# Patient Record
Sex: Male | Born: 1950 | Race: White | Hispanic: No | Marital: Married | Smoking: Former smoker
Health system: Southern US, Community
[De-identification: ages and names within clinical notes are randomized; demographics above are authoritative.]

## PROBLEM LIST (undated history)

## (undated) DIAGNOSIS — I700 Atherosclerosis of aorta: Secondary | ICD-10-CM

## (undated) DIAGNOSIS — N529 Male erectile dysfunction, unspecified: Secondary | ICD-10-CM

## (undated) DIAGNOSIS — E871 Hypo-osmolality and hyponatremia: Secondary | ICD-10-CM

## (undated) DIAGNOSIS — K2980 Duodenitis without bleeding: Secondary | ICD-10-CM

## (undated) DIAGNOSIS — D126 Benign neoplasm of colon, unspecified: Secondary | ICD-10-CM

## (undated) DIAGNOSIS — D649 Anemia, unspecified: Secondary | ICD-10-CM

## (undated) DIAGNOSIS — R202 Paresthesia of skin: Secondary | ICD-10-CM

## (undated) DIAGNOSIS — R0600 Dyspnea, unspecified: Secondary | ICD-10-CM

## (undated) DIAGNOSIS — Z87891 Personal history of nicotine dependence: Secondary | ICD-10-CM

## (undated) DIAGNOSIS — D472 Monoclonal gammopathy: Secondary | ICD-10-CM

## (undated) DIAGNOSIS — Z860100 Personal history of colon polyps, unspecified: Secondary | ICD-10-CM

## (undated) DIAGNOSIS — Z789 Other specified health status: Secondary | ICD-10-CM

## (undated) DIAGNOSIS — I739 Peripheral vascular disease, unspecified: Secondary | ICD-10-CM

## (undated) DIAGNOSIS — C801 Malignant (primary) neoplasm, unspecified: Secondary | ICD-10-CM

## (undated) DIAGNOSIS — R918 Other nonspecific abnormal finding of lung field: Secondary | ICD-10-CM

## (undated) DIAGNOSIS — M359 Systemic involvement of connective tissue, unspecified: Secondary | ICD-10-CM

## (undated) DIAGNOSIS — M329 Systemic lupus erythematosus, unspecified: Secondary | ICD-10-CM

## (undated) DIAGNOSIS — Z860101 Personal history of adenomatous and serrated colon polyps: Secondary | ICD-10-CM

## (undated) DIAGNOSIS — J309 Allergic rhinitis, unspecified: Secondary | ICD-10-CM

## (undated) DIAGNOSIS — Z7982 Long term (current) use of aspirin: Secondary | ICD-10-CM

## (undated) DIAGNOSIS — E538 Deficiency of other specified B group vitamins: Secondary | ICD-10-CM

## (undated) DIAGNOSIS — Z79899 Other long term (current) drug therapy: Secondary | ICD-10-CM

## (undated) DIAGNOSIS — I714 Abdominal aortic aneurysm, without rupture, unspecified: Secondary | ICD-10-CM

## (undated) DIAGNOSIS — I2510 Atherosclerotic heart disease of native coronary artery without angina pectoris: Secondary | ICD-10-CM

## (undated) DIAGNOSIS — R200 Anesthesia of skin: Secondary | ICD-10-CM

## (undated) DIAGNOSIS — M1990 Unspecified osteoarthritis, unspecified site: Secondary | ICD-10-CM

## (undated) DIAGNOSIS — B029 Zoster without complications: Secondary | ICD-10-CM

## (undated) DIAGNOSIS — N400 Enlarged prostate without lower urinary tract symptoms: Secondary | ICD-10-CM

## (undated) DIAGNOSIS — D3501 Benign neoplasm of right adrenal gland: Secondary | ICD-10-CM

## (undated) DIAGNOSIS — R350 Frequency of micturition: Secondary | ICD-10-CM

## (undated) DIAGNOSIS — Z955 Presence of coronary angioplasty implant and graft: Secondary | ICD-10-CM

## (undated) DIAGNOSIS — I708 Atherosclerosis of other arteries: Secondary | ICD-10-CM

## (undated) DIAGNOSIS — R0609 Other forms of dyspnea: Secondary | ICD-10-CM

## (undated) DIAGNOSIS — C642 Malignant neoplasm of left kidney, except renal pelvis: Secondary | ICD-10-CM

## (undated) DIAGNOSIS — N2889 Other specified disorders of kidney and ureter: Secondary | ICD-10-CM

## (undated) DIAGNOSIS — M48061 Spinal stenosis, lumbar region without neurogenic claudication: Secondary | ICD-10-CM

## (undated) DIAGNOSIS — G5600 Carpal tunnel syndrome, unspecified upper limb: Secondary | ICD-10-CM

## (undated) DIAGNOSIS — Z87442 Personal history of urinary calculi: Secondary | ICD-10-CM

## (undated) DIAGNOSIS — D3502 Benign neoplasm of left adrenal gland: Secondary | ICD-10-CM

## (undated) DIAGNOSIS — B977 Papillomavirus as the cause of diseases classified elsewhere: Secondary | ICD-10-CM

## (undated) DIAGNOSIS — I10 Essential (primary) hypertension: Secondary | ICD-10-CM

## (undated) DIAGNOSIS — K760 Fatty (change of) liver, not elsewhere classified: Secondary | ICD-10-CM

## (undated) DIAGNOSIS — R351 Nocturia: Secondary | ICD-10-CM

## (undated) DIAGNOSIS — A630 Anogenital (venereal) warts: Secondary | ICD-10-CM

## (undated) DIAGNOSIS — E785 Hyperlipidemia, unspecified: Secondary | ICD-10-CM

## (undated) DIAGNOSIS — Z8619 Personal history of other infectious and parasitic diseases: Secondary | ICD-10-CM

## (undated) DIAGNOSIS — I723 Aneurysm of iliac artery: Secondary | ICD-10-CM

## (undated) DIAGNOSIS — K219 Gastro-esophageal reflux disease without esophagitis: Secondary | ICD-10-CM

## (undated) DIAGNOSIS — M549 Dorsalgia, unspecified: Secondary | ICD-10-CM

## (undated) DIAGNOSIS — F419 Anxiety disorder, unspecified: Secondary | ICD-10-CM

## (undated) DIAGNOSIS — IMO0002 Reserved for concepts with insufficient information to code with codable children: Secondary | ICD-10-CM

## (undated) DIAGNOSIS — M7580 Other shoulder lesions, unspecified shoulder: Secondary | ICD-10-CM

## (undated) DIAGNOSIS — I70211 Atherosclerosis of native arteries of extremities with intermittent claudication, right leg: Secondary | ICD-10-CM

## (undated) DIAGNOSIS — Z7960 Long term (current) use of unspecified immunomodulators and immunosuppressants: Secondary | ICD-10-CM

## (undated) HISTORY — PX: BRONCHIAL BIOPSY: SHX5109

## (undated) HISTORY — PX: COLONOSCOPY: SHX174

## (undated) HISTORY — PX: ESOPHAGOGASTRODUODENOSCOPY: SHX1529

## (undated) HISTORY — PX: CORONARY ANGIOPLASTY: SHX604

## (undated) HISTORY — PX: BACK SURGERY: SHX140

## (undated) HISTORY — PX: OTHER SURGICAL HISTORY: SHX169

---

## 2005-02-26 ENCOUNTER — Ambulatory Visit: Payer: Self-pay | Admitting: General Practice

## 2005-06-18 ENCOUNTER — Ambulatory Visit: Payer: Self-pay | Admitting: Unknown Physician Specialty

## 2005-09-22 ENCOUNTER — Ambulatory Visit: Payer: Self-pay | Admitting: Emergency Medicine

## 2007-10-27 ENCOUNTER — Ambulatory Visit: Payer: Self-pay | Admitting: General Practice

## 2008-01-09 ENCOUNTER — Ambulatory Visit: Payer: Self-pay | Admitting: Rheumatology

## 2008-02-11 ENCOUNTER — Ambulatory Visit: Payer: Self-pay | Admitting: Internal Medicine

## 2008-03-09 ENCOUNTER — Ambulatory Visit: Payer: Self-pay | Admitting: Internal Medicine

## 2008-03-10 ENCOUNTER — Ambulatory Visit: Payer: Self-pay | Admitting: Internal Medicine

## 2008-03-12 ENCOUNTER — Ambulatory Visit: Payer: Self-pay | Admitting: Internal Medicine

## 2008-04-12 ENCOUNTER — Ambulatory Visit: Payer: Self-pay | Admitting: Internal Medicine

## 2008-06-12 ENCOUNTER — Ambulatory Visit: Payer: Self-pay | Admitting: Internal Medicine

## 2008-08-13 ENCOUNTER — Ambulatory Visit: Payer: Self-pay | Admitting: Unknown Physician Specialty

## 2008-12-13 ENCOUNTER — Ambulatory Visit: Payer: Self-pay | Admitting: Internal Medicine

## 2009-01-05 ENCOUNTER — Ambulatory Visit: Payer: Self-pay | Admitting: Internal Medicine

## 2009-01-10 ENCOUNTER — Ambulatory Visit: Payer: Self-pay | Admitting: Internal Medicine

## 2009-01-27 ENCOUNTER — Ambulatory Visit: Payer: Self-pay | Admitting: Internal Medicine

## 2009-01-28 ENCOUNTER — Ambulatory Visit: Payer: Self-pay | Admitting: General Practice

## 2009-02-10 ENCOUNTER — Ambulatory Visit: Payer: Self-pay | Admitting: Internal Medicine

## 2009-03-01 ENCOUNTER — Ambulatory Visit: Payer: Self-pay | Admitting: Orthopedic Surgery

## 2009-03-09 ENCOUNTER — Ambulatory Visit: Payer: Self-pay | Admitting: Orthopedic Surgery

## 2009-05-17 ENCOUNTER — Ambulatory Visit: Payer: Self-pay | Admitting: General Practice

## 2009-07-13 ENCOUNTER — Ambulatory Visit: Payer: Self-pay | Admitting: Internal Medicine

## 2009-08-05 ENCOUNTER — Ambulatory Visit: Payer: Self-pay | Admitting: Internal Medicine

## 2009-08-12 ENCOUNTER — Ambulatory Visit: Payer: Self-pay | Admitting: Internal Medicine

## 2010-01-10 ENCOUNTER — Ambulatory Visit: Payer: Self-pay | Admitting: Internal Medicine

## 2010-02-02 ENCOUNTER — Ambulatory Visit: Payer: Self-pay | Admitting: Internal Medicine

## 2010-02-10 ENCOUNTER — Ambulatory Visit: Payer: Self-pay | Admitting: Internal Medicine

## 2010-03-12 ENCOUNTER — Ambulatory Visit: Payer: Self-pay | Admitting: Internal Medicine

## 2010-04-06 ENCOUNTER — Ambulatory Visit: Payer: Self-pay | Admitting: Internal Medicine

## 2010-04-12 ENCOUNTER — Ambulatory Visit: Payer: Self-pay | Admitting: Internal Medicine

## 2010-05-12 ENCOUNTER — Ambulatory Visit: Payer: Self-pay | Admitting: Internal Medicine

## 2010-06-12 ENCOUNTER — Ambulatory Visit: Payer: Self-pay | Admitting: Internal Medicine

## 2010-07-13 ENCOUNTER — Ambulatory Visit: Payer: Self-pay | Admitting: Internal Medicine

## 2010-08-12 ENCOUNTER — Ambulatory Visit: Payer: Self-pay | Admitting: Internal Medicine

## 2010-09-12 ENCOUNTER — Ambulatory Visit: Payer: Self-pay | Admitting: Internal Medicine

## 2010-11-12 HISTORY — PX: CARDIAC CATHETERIZATION: SHX172

## 2010-11-24 ENCOUNTER — Ambulatory Visit: Payer: Self-pay | Admitting: General Practice

## 2010-12-07 ENCOUNTER — Ambulatory Visit: Payer: Self-pay | Admitting: Cardiology

## 2010-12-08 HISTORY — PX: CORONARY ANGIOPLASTY WITH STENT PLACEMENT: SHX49

## 2011-02-15 ENCOUNTER — Observation Stay: Payer: Self-pay | Admitting: Internal Medicine

## 2011-09-07 ENCOUNTER — Ambulatory Visit: Payer: Self-pay | Admitting: Internal Medicine

## 2011-09-13 ENCOUNTER — Ambulatory Visit: Payer: Self-pay | Admitting: Internal Medicine

## 2011-10-19 ENCOUNTER — Ambulatory Visit: Payer: Self-pay | Admitting: Unknown Physician Specialty

## 2012-04-11 ENCOUNTER — Other Ambulatory Visit: Payer: Self-pay | Admitting: General Practice

## 2012-05-29 ENCOUNTER — Ambulatory Visit: Payer: Self-pay | Admitting: Oncology

## 2014-01-01 ENCOUNTER — Ambulatory Visit: Payer: Self-pay | Admitting: Unknown Physician Specialty

## 2014-01-04 LAB — PATHOLOGY REPORT

## 2014-03-15 DIAGNOSIS — G5600 Carpal tunnel syndrome, unspecified upper limb: Secondary | ICD-10-CM | POA: Insufficient documentation

## 2014-03-15 DIAGNOSIS — D649 Anemia, unspecified: Secondary | ICD-10-CM | POA: Insufficient documentation

## 2014-03-18 DIAGNOSIS — I2510 Atherosclerotic heart disease of native coronary artery without angina pectoris: Secondary | ICD-10-CM | POA: Insufficient documentation

## 2014-04-27 DIAGNOSIS — D472 Monoclonal gammopathy: Secondary | ICD-10-CM | POA: Insufficient documentation

## 2014-04-27 DIAGNOSIS — K760 Fatty (change of) liver, not elsewhere classified: Secondary | ICD-10-CM | POA: Insufficient documentation

## 2014-12-18 ENCOUNTER — Ambulatory Visit: Payer: Self-pay | Admitting: General Practice

## 2015-01-12 ENCOUNTER — Other Ambulatory Visit (HOSPITAL_COMMUNITY): Payer: Self-pay | Admitting: Neurosurgery

## 2015-01-24 ENCOUNTER — Encounter (HOSPITAL_COMMUNITY): Payer: Self-pay | Admitting: Pharmacy Technician

## 2015-01-25 ENCOUNTER — Encounter (HOSPITAL_COMMUNITY)
Admission: RE | Admit: 2015-01-25 | Discharge: 2015-01-25 | Disposition: A | Discharge status: Home / Self Care | Payer: BLUE CROSS/BLUE SHIELD | Source: Ambulatory Visit | Attending: Neurosurgery | Admitting: Neurosurgery

## 2015-01-25 ENCOUNTER — Encounter (HOSPITAL_COMMUNITY): Payer: Self-pay

## 2015-01-25 DIAGNOSIS — M7138 Other bursal cyst, other site: Secondary | ICD-10-CM | POA: Insufficient documentation

## 2015-01-25 DIAGNOSIS — Z01812 Encounter for preprocedural laboratory examination: Secondary | ICD-10-CM | POA: Diagnosis present

## 2015-01-25 HISTORY — DX: Gastro-esophageal reflux disease without esophagitis: K21.9

## 2015-01-25 HISTORY — DX: Systemic lupus erythematosus, unspecified: M32.9

## 2015-01-25 HISTORY — DX: Benign prostatic hyperplasia without lower urinary tract symptoms: N40.0

## 2015-01-25 HISTORY — DX: Personal history of other infectious and parasitic diseases: Z86.19

## 2015-01-25 HISTORY — DX: Unspecified osteoarthritis, unspecified site: M19.90

## 2015-01-25 HISTORY — DX: Anxiety disorder, unspecified: F41.9

## 2015-01-25 HISTORY — DX: Reserved for concepts with insufficient information to code with codable children: IMO0002

## 2015-01-25 HISTORY — DX: Paresthesia of skin: R20.2

## 2015-01-25 HISTORY — DX: Nocturia: R35.1

## 2015-01-25 HISTORY — DX: Atherosclerotic heart disease of native coronary artery without angina pectoris: I25.10

## 2015-01-25 HISTORY — DX: Personal history of colonic polyps: Z86.010

## 2015-01-25 HISTORY — DX: Essential (primary) hypertension: I10

## 2015-01-25 HISTORY — DX: Anemia, unspecified: D64.9

## 2015-01-25 HISTORY — DX: Dorsalgia, unspecified: M54.9

## 2015-01-25 HISTORY — DX: Frequency of micturition: R35.0

## 2015-01-25 HISTORY — DX: Personal history of colon polyps, unspecified: Z86.0100

## 2015-01-25 HISTORY — DX: Anesthesia of skin: R20.0

## 2015-01-25 HISTORY — DX: Hyperlipidemia, unspecified: E78.5

## 2015-01-25 LAB — CBC
HEMATOCRIT: 43.5 % (ref 39.0–52.0)
Hemoglobin: 15.9 g/dL (ref 13.0–17.0)
MCH: 34.9 pg — AB (ref 26.0–34.0)
MCHC: 36.6 g/dL — ABNORMAL HIGH (ref 30.0–36.0)
MCV: 95.6 fL (ref 78.0–100.0)
Platelets: 239 10*3/uL (ref 150–400)
RBC: 4.55 MIL/uL (ref 4.22–5.81)
RDW: 12.7 % (ref 11.5–15.5)
WBC: 4.1 10*3/uL (ref 4.0–10.5)

## 2015-01-25 LAB — BASIC METABOLIC PANEL
ANION GAP: 8 (ref 5–15)
BUN: 9 mg/dL (ref 6–23)
CHLORIDE: 91 mmol/L — AB (ref 96–112)
CO2: 28 mmol/L (ref 19–32)
Calcium: 9.2 mg/dL (ref 8.4–10.5)
Creatinine, Ser: 0.89 mg/dL (ref 0.50–1.35)
GLUCOSE: 98 mg/dL (ref 70–99)
POTASSIUM: 4 mmol/L (ref 3.5–5.1)
SODIUM: 127 mmol/L — AB (ref 135–145)

## 2015-01-25 LAB — SURGICAL PCR SCREEN
MRSA, PCR: NEGATIVE
Staphylococcus aureus: NEGATIVE

## 2015-01-25 NOTE — Progress Notes (Signed)
   01/25/15 1039  OBSTRUCTIVE SLEEP APNEA  Have you ever been diagnosed with sleep apnea through a sleep study? No  Do you snore loudly (loud enough to be heard through closed doors)?  0  Do you often feel tired, fatigued, or sleepy during the daytime? 0  Has anyone observed you stop breathing during your sleep? 0  Do you have, or are you being treated for high blood pressure? 1  BMI more than 35 kg/m2? 0  Age over 62 years old? 1  Neck circumference greater than 40 cm/16 inches? 1 (16.5)  Gender: 1

## 2015-01-25 NOTE — Progress Notes (Addendum)
Cardiologist is Dr.Fath with [facility]   EKG done about a month ago-to request from Dr.Fath  Stress test in 2012-to request from [facility] cath to be requested from [facility] is Dr.Whyatt Burt Ek  Denies CXR in past yr

## 2015-01-25 NOTE — Pre-Procedure Instructions (Signed)
Sergio Callahan  01/25/2015   Your procedure is scheduled on:  Thurs, Mar 24 @ 2:40 PM  Report to [facility]  at 11:45 AM.  Call this number if you have problems the morning of surgery: [phone]   Remember:   Do not eat food or drink liquids after midnight.   Take these medicines the morning of surgery with A SIP OF WATER: Alprazolam(Xanax),Azathioprine(Imuran),Flonase(Fluticasone-if needed),Pain Pill(if needed),Omeprazole(Prilosec),and Tamsulosin(Flomax)             Stop taking your Aspirin. No Goody's,BC's,Aleve,Ibuprofen,Fish Oil,or Herbal Medications.    Do not wear jewelry.  Do not wear lotions, powders, or colognes. You may wear deodorant.  Men may shave face and neck.  Do not bring valuables to the hospital.  [facility] is not responsible                  for any belongings or valuables.               Contacts, dentures or bridgework may not be worn into surgery.  Leave suitcase in the car. After surgery it may be brought to your room.  For patients admitted to the hospital, discharge time is determined by your                treatment team.                  Special Instructions:  [place] - Preparing for Surgery  Before surgery, you can play an important role.  Because skin is not sterile, your skin needs to be as free of germs as possible.  You can reduce the number of germs on you skin by washing with CHG (chlorahexidine gluconate) soap before surgery.  CHG is an antiseptic cleaner which kills germs and bonds with the skin to continue killing germs even after washing.  Please DO NOT use if you have an allergy to CHG or antibacterial soaps.  If your skin becomes reddened/irritated stop using the CHG and inform your nurse when you arrive at Short Stay.  Do not shave (including legs and underarms) for at least 48 hours prior to the first CHG shower.  You may shave your face.  Please follow these instructions carefully:   1.  Shower with CHG Soap the night  before surgery and the                                morning of Surgery.  2.  If you choose to wash your hair, wash your hair first as usual with your       normal shampoo.  3.  After you shampoo, rinse your hair and body thoroughly to remove the                      Shampoo.  4.  Use CHG as you would any other liquid soap.  You can apply chg directly       to the skin and wash gently with scrungie or a clean washcloth.  5.  Apply the CHG Soap to your body ONLY FROM THE NECK DOWN.        Do not use on open wounds or open sores.  Avoid contact with your eyes,       ears, mouth and genitals (private parts).  Wash genitals (private parts)       with your normal soap.  6.  Wash thoroughly, paying special attention to the area where your surgery        will be performed.  7.  Thoroughly rinse your body with warm water from the neck down.  8.  DO NOT shower/wash with your normal soap after using and rinsing off       the CHG Soap.  9.  Pat yourself dry with a clean towel.            10.  Wear clean pajamas.            11.  Place clean sheets on your bed the night of your first shower and do not        sleep with pets.  Day of Surgery  Do not apply any lotions/deoderants the morning of surgery.  Please wear clean clothes to the hospital/surgery center.     Please read over the following fact sheets that you were given: Pain Booklet, Coughing and Deep Breathing, MRSA Information and Surgical Site Infection Prevention

## 2015-01-25 NOTE — Progress Notes (Signed)
Anesthesia Chart Review:  Pt is 64 year old male scheduled for L4-5 lumbar laminectomy/decompression/microdiscectomy on 02/03/2015 with Dr. Arnoldo Morale.   Cardiologist is Dr. Ubaldo Glassing at [facility].   PMH includes: HTN, CAD, hyperlipidemia, anemia, GERD. Current smoker. BMI 26.   Preoperative labs reviewed.  NA 127, Cl 91.   EKG 01/11/2015: NSR.   Exercise stress test 02/15/2011: 1. Normal treadmill EKG without evidence of ischemia or arrhythmia.  2. Average exercise tolerance for age.  3. Normal LV systolic function. EF 75% 4. Normal myocardial perfusion without evidence of myocardial ischemia  Cardiac cath 12/08/2010: -LM and LCFX normal. RCA not evaluated. Proximal LAD with 99% stenosis.  -DES to LAD.   Pt has cardiac clearance from Dr. Ubaldo Glassing. (See progress note on paper chart).   If no changes, I anticipate pt can proceed with surgery as scheduled.   Willeen Cass, FNP-BC [facility] Short Stay Surgical Center/Anesthesiology Phone: [phone] 01/25/2015 3:35 PM

## 2015-02-02 MED ORDER — CEFAZOLIN SODIUM-DEXTROSE 2-3 GM-% IV SOLR
2.0000 g | INTRAVENOUS | Status: AC
  Administered 2015-02-03: 2 g via INTRAVENOUS
  Filled 2015-02-02: qty 50

## 2015-02-03 ENCOUNTER — Encounter (HOSPITAL_COMMUNITY): Payer: Self-pay | Admitting: Critical Care Medicine

## 2015-02-03 ENCOUNTER — Inpatient Hospital Stay (HOSPITAL_COMMUNITY)
Admission: RE | Admit: 2015-02-03 | Discharge: 2015-02-03 | DRG: 502 | Disposition: A | Discharge status: Home / Self Care | Payer: BLUE CROSS/BLUE SHIELD | Source: Ambulatory Visit | Attending: Neurosurgery | Admitting: Neurosurgery

## 2015-02-03 ENCOUNTER — Encounter (HOSPITAL_COMMUNITY)
Admission: RE | Disposition: A | Discharge status: Home / Self Care | Payer: Self-pay | Source: Ambulatory Visit | Attending: Neurosurgery

## 2015-02-03 ENCOUNTER — Inpatient Hospital Stay (HOSPITAL_COMMUNITY): Payer: BLUE CROSS/BLUE SHIELD | Admitting: Emergency Medicine

## 2015-02-03 ENCOUNTER — Inpatient Hospital Stay (HOSPITAL_COMMUNITY): Payer: BLUE CROSS/BLUE SHIELD | Admitting: Critical Care Medicine

## 2015-02-03 ENCOUNTER — Inpatient Hospital Stay (HOSPITAL_COMMUNITY): Payer: BLUE CROSS/BLUE SHIELD

## 2015-02-03 DIAGNOSIS — Z419 Encounter for procedure for purposes other than remedying health state, unspecified: Secondary | ICD-10-CM

## 2015-02-03 DIAGNOSIS — Z7982 Long term (current) use of aspirin: Secondary | ICD-10-CM | POA: Diagnosis not present

## 2015-02-03 DIAGNOSIS — M4806 Spinal stenosis, lumbar region: Secondary | ICD-10-CM | POA: Diagnosis present

## 2015-02-03 DIAGNOSIS — N400 Enlarged prostate without lower urinary tract symptoms: Secondary | ICD-10-CM | POA: Diagnosis present

## 2015-02-03 DIAGNOSIS — F17210 Nicotine dependence, cigarettes, uncomplicated: Secondary | ICD-10-CM | POA: Diagnosis present

## 2015-02-03 DIAGNOSIS — M329 Systemic lupus erythematosus, unspecified: Secondary | ICD-10-CM | POA: Diagnosis present

## 2015-02-03 DIAGNOSIS — M7138 Other bursal cyst, other site: Secondary | ICD-10-CM | POA: Diagnosis present

## 2015-02-03 DIAGNOSIS — Z79899 Other long term (current) drug therapy: Secondary | ICD-10-CM | POA: Diagnosis not present

## 2015-02-03 DIAGNOSIS — K219 Gastro-esophageal reflux disease without esophagitis: Secondary | ICD-10-CM | POA: Diagnosis present

## 2015-02-03 DIAGNOSIS — I2510 Atherosclerotic heart disease of native coronary artery without angina pectoris: Secondary | ICD-10-CM | POA: Diagnosis present

## 2015-02-03 DIAGNOSIS — I10 Essential (primary) hypertension: Secondary | ICD-10-CM | POA: Diagnosis present

## 2015-02-03 DIAGNOSIS — M5416 Radiculopathy, lumbar region: Secondary | ICD-10-CM | POA: Diagnosis present

## 2015-02-03 DIAGNOSIS — Z955 Presence of coronary angioplasty implant and graft: Secondary | ICD-10-CM

## 2015-02-03 DIAGNOSIS — M1990 Unspecified osteoarthritis, unspecified site: Secondary | ICD-10-CM | POA: Diagnosis present

## 2015-02-03 DIAGNOSIS — F419 Anxiety disorder, unspecified: Secondary | ICD-10-CM | POA: Diagnosis present

## 2015-02-03 DIAGNOSIS — E785 Hyperlipidemia, unspecified: Secondary | ICD-10-CM | POA: Diagnosis present

## 2015-02-03 HISTORY — PX: LUMBAR LAMINECTOMY/DECOMPRESSION MICRODISCECTOMY: SHX5026

## 2015-02-03 SURGERY — LUMBAR LAMINECTOMY/DECOMPRESSION MICRODISCECTOMY 1 LEVEL
Anesthesia: General | Site: Spine Lumbar | Laterality: Left

## 2015-02-03 MED ORDER — LIDOCAINE HCL (CARDIAC) 20 MG/ML IV SOLN
INTRAVENOUS | Status: DC | PRN
  Administered 2015-02-03: 100 mg via INTRAVENOUS

## 2015-02-03 MED ORDER — LACTATED RINGERS IV SOLN: INTRAVENOUS | Status: DC

## 2015-02-03 MED ORDER — PHENYLEPHRINE HCL 10 MG/ML IJ SOLN
INTRAMUSCULAR | Status: AC
  Filled 2015-02-03: qty 1

## 2015-02-03 MED ORDER — DEXAMETHASONE SODIUM PHOSPHATE 4 MG/ML IJ SOLN
INTRAMUSCULAR | Status: DC | PRN
  Administered 2015-02-03: 4 mg via INTRAVENOUS

## 2015-02-03 MED ORDER — HYDROMORPHONE HCL 1 MG/ML IJ SOLN: 0.2500 mg | INTRAMUSCULAR | Status: DC | PRN

## 2015-02-03 MED ORDER — NEOSTIGMINE METHYLSULFATE 10 MG/10ML IV SOLN
INTRAVENOUS | Status: AC
  Filled 2015-02-03: qty 1

## 2015-02-03 MED ORDER — MORPHINE SULFATE 2 MG/ML IJ SOLN: 1.0000 mg | INTRAMUSCULAR | Status: DC | PRN

## 2015-02-03 MED ORDER — PANTOPRAZOLE SODIUM 40 MG PO TBEC: 40.0000 mg | DELAYED_RELEASE_TABLET | Freq: Every day | ORAL | Status: DC

## 2015-02-03 MED ORDER — OXYCODONE-ACETAMINOPHEN 5-325 MG PO TABS: 1.0000 | ORAL_TABLET | ORAL | Status: DC | PRN

## 2015-02-03 MED ORDER — ONDANSETRON HCL 4 MG/2ML IJ SOLN
INTRAMUSCULAR | Status: AC
  Filled 2015-02-03: qty 2

## 2015-02-03 MED ORDER — LACTATED RINGERS IV SOLN
INTRAVENOUS | Status: DC | PRN
  Administered 2015-02-03: 13:00:00 via INTRAVENOUS

## 2015-02-03 MED ORDER — PROPOFOL 10 MG/ML IV BOLUS
INTRAVENOUS | Status: DC | PRN
  Administered 2015-02-03: 150 mg via INTRAVENOUS
  Administered 2015-02-03: 50 mg via INTRAVENOUS
  Administered 2015-02-03: 10 mg via INTRAVENOUS

## 2015-02-03 MED ORDER — HEMOSTATIC AGENTS (NO CHARGE) OPTIME
TOPICAL | Status: DC | PRN
  Administered 2015-02-03: 1 via TOPICAL

## 2015-02-03 MED ORDER — CEFAZOLIN SODIUM-DEXTROSE 2-3 GM-% IV SOLR
2.0000 g | Freq: Three times a day (TID) | INTRAVENOUS | Status: DC
  Administered 2015-02-03: 2 g via INTRAVENOUS
  Filled 2015-02-03 (×2): qty 50

## 2015-02-03 MED ORDER — 0.9 % SODIUM CHLORIDE (POUR BTL) OPTIME
TOPICAL | Status: DC | PRN
  Administered 2015-02-03: 1000 mL

## 2015-02-03 MED ORDER — DIAZEPAM 5 MG PO TABS: 5.0000 mg | ORAL_TABLET | Freq: Four times a day (QID) | ORAL | Status: DC | PRN

## 2015-02-03 MED ORDER — NEOSTIGMINE METHYLSULFATE 10 MG/10ML IV SOLN
INTRAVENOUS | Status: DC | PRN
  Administered 2015-02-03: 4 mg via INTRAVENOUS

## 2015-02-03 MED ORDER — ARTIFICIAL TEARS OP OINT
TOPICAL_OINTMENT | OPHTHALMIC | Status: DC | PRN
  Administered 2015-02-03: 1 via OPHTHALMIC

## 2015-02-03 MED ORDER — BUPIVACAINE-EPINEPHRINE (PF) 0.5% -1:200000 IJ SOLN
INTRAMUSCULAR | Status: DC | PRN
  Administered 2015-02-03 (×2): 10 mL

## 2015-02-03 MED ORDER — ROCURONIUM BROMIDE 100 MG/10ML IV SOLN
INTRAVENOUS | Status: DC | PRN
  Administered 2015-02-03: 40 mg via INTRAVENOUS

## 2015-02-03 MED ORDER — SODIUM CHLORIDE 0.9 % IR SOLN
Status: DC | PRN
  Administered 2015-02-03: 500 mL

## 2015-02-03 MED ORDER — PHENYLEPHRINE HCL 10 MG/ML IJ SOLN
INTRAMUSCULAR | Status: DC | PRN
  Administered 2015-02-03 (×5): 40 ug via INTRAVENOUS

## 2015-02-03 MED ORDER — FENTANYL CITRATE 0.05 MG/ML IJ SOLN
INTRAMUSCULAR | Status: DC | PRN
  Administered 2015-02-03: 25 ug via INTRAVENOUS
  Administered 2015-02-03: 150 ug via INTRAVENOUS
  Administered 2015-02-03: 25 ug via INTRAVENOUS
  Administered 2015-02-03: 50 ug via INTRAVENOUS

## 2015-02-03 MED ORDER — HYDROCHLOROTHIAZIDE 25 MG PO TABS: 25.0000 mg | ORAL_TABLET | Freq: Every day | ORAL | Status: DC

## 2015-02-03 MED ORDER — ALPRAZOLAM 0.5 MG PO TABS: 0.5000 mg | ORAL_TABLET | Freq: Every evening | ORAL | Status: DC | PRN

## 2015-02-03 MED ORDER — BISACODYL 10 MG RE SUPP: 10.0000 mg | Freq: Every day | RECTAL | Status: DC | PRN

## 2015-02-03 MED ORDER — PHENYLEPHRINE 40 MCG/ML (10ML) SYRINGE FOR IV PUSH (FOR BLOOD PRESSURE SUPPORT)
PREFILLED_SYRINGE | INTRAVENOUS | Status: AC
Start: 2015-02-03 — End: 2015-02-03
  Filled 2015-02-03: qty 10

## 2015-02-03 MED ORDER — DOCUSATE SODIUM 100 MG PO CAPS: 100.0000 mg | ORAL_CAPSULE | Freq: Two times a day (BID) | ORAL | Status: DC

## 2015-02-03 MED ORDER — HYDROCODONE-ACETAMINOPHEN 5-325 MG PO TABS: 1.0000 | ORAL_TABLET | ORAL | Status: DC | PRN

## 2015-02-03 MED ORDER — FLUTICASONE PROPIONATE 50 MCG/ACT NA SUSP: 1.0000 | Freq: Every day | NASAL | Status: DC | PRN

## 2015-02-03 MED ORDER — FENTANYL CITRATE 0.05 MG/ML IJ SOLN
INTRAMUSCULAR | Status: AC
  Filled 2015-02-03: qty 5

## 2015-02-03 MED ORDER — PRAVASTATIN SODIUM 40 MG PO TABS
40.0000 mg | ORAL_TABLET | Freq: Every day | ORAL | Status: DC
  Filled 2015-02-03: qty 1

## 2015-02-03 MED ORDER — MIDAZOLAM HCL 5 MG/5ML IJ SOLN
INTRAMUSCULAR | Status: DC | PRN
  Administered 2015-02-03: 2 mg via INTRAVENOUS

## 2015-02-03 MED ORDER — LISINOPRIL-HYDROCHLOROTHIAZIDE 20-25 MG PO TABS: 1.0000 | ORAL_TABLET | Freq: Every day | ORAL | Status: DC

## 2015-02-03 MED ORDER — ALUM & MAG HYDROXIDE-SIMETH 200-200-20 MG/5ML PO SUSP: 30.0000 mL | Freq: Four times a day (QID) | ORAL | Status: DC | PRN

## 2015-02-03 MED ORDER — OXYCODONE HCL 5 MG PO TABS: 5.0000 mg | ORAL_TABLET | Freq: Once | ORAL | Status: DC | PRN

## 2015-02-03 MED ORDER — PHENYLEPHRINE HCL 10 MG/ML IJ SOLN
10.0000 mg | INTRAMUSCULAR | Status: DC | PRN
  Administered 2015-02-03: 20 ug/min via INTRAVENOUS

## 2015-02-03 MED ORDER — TAMSULOSIN HCL 0.4 MG PO CAPS
0.4000 mg | ORAL_CAPSULE | Freq: Every day | ORAL | Status: DC
  Filled 2015-02-03: qty 1

## 2015-02-03 MED ORDER — ACETAMINOPHEN 325 MG PO TABS: 650.0000 mg | ORAL_TABLET | ORAL | Status: DC | PRN

## 2015-02-03 MED ORDER — METOPROLOL SUCCINATE ER 100 MG PO TB24
100.0000 mg | ORAL_TABLET | Freq: Every day | ORAL | Status: DC
  Filled 2015-02-03: qty 1

## 2015-02-03 MED ORDER — PROMETHAZINE HCL 25 MG/ML IJ SOLN: 6.2500 mg | INTRAMUSCULAR | Status: DC | PRN

## 2015-02-03 MED ORDER — BACITRACIN ZINC 500 UNIT/GM EX OINT
TOPICAL_OINTMENT | CUTANEOUS | Status: DC | PRN
  Administered 2015-02-03: 1 via TOPICAL

## 2015-02-03 MED ORDER — MIDAZOLAM HCL 2 MG/2ML IJ SOLN
INTRAMUSCULAR | Status: AC
  Filled 2015-02-03: qty 2

## 2015-02-03 MED ORDER — THROMBIN 5000 UNITS EX SOLR
CUTANEOUS | Status: DC | PRN
  Administered 2015-02-03 (×2): 5000 [IU] via TOPICAL

## 2015-02-03 MED ORDER — GLYCOPYRROLATE 0.2 MG/ML IJ SOLN
INTRAMUSCULAR | Status: DC | PRN
  Administered 2015-02-03: 0.6 mg via INTRAVENOUS

## 2015-02-03 MED ORDER — DEXAMETHASONE SODIUM PHOSPHATE 4 MG/ML IJ SOLN
INTRAMUSCULAR | Status: AC
  Filled 2015-02-03: qty 1

## 2015-02-03 MED ORDER — MENTHOL 3 MG MT LOZG: 1.0000 | LOZENGE | OROMUCOSAL | Status: DC | PRN

## 2015-02-03 MED ORDER — ONDANSETRON HCL 4 MG/2ML IJ SOLN: 4.0000 mg | INTRAMUSCULAR | Status: DC | PRN

## 2015-02-03 MED ORDER — ASPIRIN 81 MG PO TBEC: 81.0000 mg | DELAYED_RELEASE_TABLET | Freq: Every day | ORAL | Status: DC

## 2015-02-03 MED ORDER — ONDANSETRON HCL 4 MG/2ML IJ SOLN
INTRAMUSCULAR | Status: DC | PRN
  Administered 2015-02-03: 4 mg via INTRAVENOUS

## 2015-02-03 MED ORDER — OXYCODONE HCL 5 MG/5ML PO SOLN: 5.0000 mg | Freq: Once | ORAL | Status: DC | PRN

## 2015-02-03 MED ORDER — GLYCOPYRROLATE 0.2 MG/ML IJ SOLN
INTRAMUSCULAR | Status: AC
  Filled 2015-02-03: qty 3

## 2015-02-03 MED ORDER — LISINOPRIL 20 MG PO TABS: 20.0000 mg | ORAL_TABLET | Freq: Every day | ORAL | Status: DC

## 2015-02-03 MED ORDER — PHENOL 1.4 % MT LIQD: 1.0000 | OROMUCOSAL | Status: DC | PRN

## 2015-02-03 MED ORDER — ACETAMINOPHEN 650 MG RE SUPP: 650.0000 mg | RECTAL | Status: DC | PRN

## 2015-02-03 SURGICAL SUPPLY — 56 items
BAG DECANTER FOR FLEXI CONT (MISCELLANEOUS) ×3 IMPLANT
BENZOIN TINCTURE PRP APPL 2/3 (GAUZE/BANDAGES/DRESSINGS) ×3 IMPLANT
BLADE CLIPPER SURG (BLADE) ×3 IMPLANT
BRUSH SCRUB EZ PLAIN DRY (MISCELLANEOUS) ×3 IMPLANT
BUR MATCHSTICK NEURO 3.0 LAGG (BURR) ×3 IMPLANT
BUR PRECISION FLUTE 6.0 (BURR) ×3 IMPLANT
CANISTER SUCT 3000ML PPV (MISCELLANEOUS) ×3 IMPLANT
CLOSURE WOUND 1/2 X4 (GAUZE/BANDAGES/DRESSINGS) ×1
CONT SPEC 4OZ CLIKSEAL STRL BL (MISCELLANEOUS) ×3 IMPLANT
DRAPE LAPAROTOMY 100X72X124 (DRAPES) ×3 IMPLANT
DRAPE MICROSCOPE LEICA (MISCELLANEOUS) ×3 IMPLANT
DRAPE POUCH INSTRU U-SHP 10X18 (DRAPES) ×3 IMPLANT
DRAPE SURG 17X23 STRL (DRAPES) ×12 IMPLANT
ELECT BLADE 4.0 EZ CLEAN MEGAD (MISCELLANEOUS) ×3
ELECT REM PT RETURN 9FT ADLT (ELECTROSURGICAL) ×3
ELECTRODE BLDE 4.0 EZ CLN MEGD (MISCELLANEOUS) ×1 IMPLANT
ELECTRODE REM PT RTRN 9FT ADLT (ELECTROSURGICAL) ×1 IMPLANT
GAUZE SPONGE 4X4 12PLY STRL (GAUZE/BANDAGES/DRESSINGS) ×3 IMPLANT
GAUZE SPONGE 4X4 16PLY XRAY LF (GAUZE/BANDAGES/DRESSINGS) IMPLANT
GLOVE BIO SURGEON STRL SZ7 (GLOVE) ×3 IMPLANT
GLOVE BIO SURGEON STRL SZ8 (GLOVE) ×3 IMPLANT
GLOVE BIO SURGEON STRL SZ8.5 (GLOVE) ×3 IMPLANT
GLOVE BIOGEL PI IND STRL 7.5 (GLOVE) ×3 IMPLANT
GLOVE BIOGEL PI INDICATOR 7.5 (GLOVE) ×6
GLOVE ECLIPSE 7.0 STRL STRAW (GLOVE) ×3 IMPLANT
GLOVE ECLIPSE 7.5 STRL STRAW (GLOVE) ×6 IMPLANT
GLOVE EXAM NITRILE LRG STRL (GLOVE) IMPLANT
GLOVE EXAM NITRILE MD LF STRL (GLOVE) IMPLANT
GLOVE EXAM NITRILE XL STR (GLOVE) IMPLANT
GLOVE EXAM NITRILE XS STR PU (GLOVE) IMPLANT
GLOVE SURG SS PI 7.0 STRL IVOR (GLOVE) ×6 IMPLANT
GOWN STRL REUS W/ TWL LRG LVL3 (GOWN DISPOSABLE) ×1 IMPLANT
GOWN STRL REUS W/ TWL XL LVL3 (GOWN DISPOSABLE) ×3 IMPLANT
GOWN STRL REUS W/TWL 2XL LVL3 (GOWN DISPOSABLE) IMPLANT
GOWN STRL REUS W/TWL LRG LVL3 (GOWN DISPOSABLE) ×2
GOWN STRL REUS W/TWL XL LVL3 (GOWN DISPOSABLE) ×6
KIT BASIN OR (CUSTOM PROCEDURE TRAY) ×3 IMPLANT
KIT ROOM TURNOVER OR (KITS) ×3 IMPLANT
NEEDLE HYPO 21X1.5 SAFETY (NEEDLE) IMPLANT
NEEDLE HYPO 22GX1.5 SAFETY (NEEDLE) ×3 IMPLANT
NS IRRIG 1000ML POUR BTL (IV SOLUTION) ×3 IMPLANT
PACK LAMINECTOMY NEURO (CUSTOM PROCEDURE TRAY) ×3 IMPLANT
PAD ARMBOARD 7.5X6 YLW CONV (MISCELLANEOUS) ×15 IMPLANT
PATTIES SURGICAL .5 X1 (DISPOSABLE) IMPLANT
RUBBERBAND STERILE (MISCELLANEOUS) ×6 IMPLANT
SPONGE SURGIFOAM ABS GEL SZ50 (HEMOSTASIS) ×3 IMPLANT
STRIP CLOSURE SKIN 1/2X4 (GAUZE/BANDAGES/DRESSINGS) ×2 IMPLANT
SUT VIC AB 1 CT1 18XBRD ANBCTR (SUTURE) ×1 IMPLANT
SUT VIC AB 1 CT1 8-18 (SUTURE) ×2
SUT VIC AB 2-0 CP2 18 (SUTURE) ×3 IMPLANT
SYR 20CC LL (SYRINGE) IMPLANT
SYR 20ML ECCENTRIC (SYRINGE) ×3 IMPLANT
TAPE CLOTH SURG 4X10 WHT LF (GAUZE/BANDAGES/DRESSINGS) ×3 IMPLANT
TOWEL OR 17X24 6PK STRL BLUE (TOWEL DISPOSABLE) ×3 IMPLANT
TOWEL OR 17X26 10 PK STRL BLUE (TOWEL DISPOSABLE) ×3 IMPLANT
WATER STERILE IRR 1000ML POUR (IV SOLUTION) ×3 IMPLANT

## 2015-02-03 NOTE — Plan of Care (Signed)
Problem: Consults Goal: Diagnosis - Spinal Surgery Outcome: Completed/Met Date Met:  02/03/15 Lumbar Laminectomy (Complex)

## 2015-02-03 NOTE — Discharge Summary (Signed)
Physician Discharge Summary  Patient ID: Sergio Callahan MRN: [id number] DOB/AGE: 12-Jan-1951 64 y.o.  Admit date: 02/03/2015 Discharge date: 02/03/2015  Admission Diagnoses: Lumbar synovial cyst, L4-5  Discharge Diagnoses: Same Active Problems:   Synovial cyst of lumbar facet joint   Discharged Condition: Stable  Hospital Course:  Mrs. BRAISON SNOKE is a 64 y.o. male who presented to the clinic with left radiculopathy and MRI demonstrating synovial cyst at L4-5. The patient was admitted for elective left L4-5 laminotomy and resection of synovial cyst which was done without complication. Postoperatively the patient was at neurologic baseline. Back pain was controlled with oral medication, he was ambulating without difficulty, voiding normally, and tolerating diet.  Treatments: Surgery - left L4-5 laminotomy, resection of synovial cyst  Discharge Exam: Blood pressure 161/95, pulse 74, temperature 98.4 F (36.9 C), temperature source Oral, resp. rate 20, height 6' (1.829 m), weight 86.955 kg (191 lb 11.2 oz), SpO2 99 %. Awake, alert, oriented Speech fluent, appropriate CN grossly intact 5/5 BUE/BLE Wound c/d/i  Follow-up: Follow-up in Dr. Arnoldo Morale' office [facility] [phone]) in 2-3 weeks  Disposition: Home     Medication List    STOP taking these medications        HYDROcodone-acetaminophen 5-325 MG per tablet  Commonly known as:  NORCO/VICODIN      TAKE these medications        ALPRAZolam 0.5 MG tablet  Commonly known as:  XANAX  Take 0.5 mg by mouth at bedtime as needed for anxiety.     aspirin 81 MG EC tablet  Take 1 tablet (81 mg total) by mouth daily. Swallow whole.  Start taking on:  02/10/2015     azaTHIOprine 50 MG tablet  Commonly known as:  IMURAN  Take 100 mg by mouth daily.     calcium carbonate 1250 (500 CA) MG tablet  Commonly known as:  OS-CAL - dosed in mg of elemental calcium  Take 1 tablet by mouth 2 (two) times daily  with a meal.     cyanocobalamin 1000 MCG/ML injection  Commonly known as:  (VITAMIN B-12)  Inject 1,000 mcg into the muscle every 30 (thirty) days.     diazepam 5 MG tablet  Commonly known as:  VALIUM  Take 1 tablet (5 mg total) by mouth every 6 (six) hours as needed for muscle spasms.     fluticasone 50 MCG/ACT nasal spray  Commonly known as:  FLONASE  Place 1 spray into both nostrils daily as needed for allergies.     hydroxychloroquine 200 MG tablet  Commonly known as:  PLAQUENIL  Take 200 mg by mouth 2 (two) times daily.     lisinopril-hydrochlorothiazide 20-25 MG per tablet  Commonly known as:  PRINZIDE,ZESTORETIC  Take 1 tablet by mouth daily.     metoprolol succinate 100 MG 24 hr tablet  Commonly known as:  TOPROL-XL  Take 100 mg by mouth at bedtime. Take with or immediately following a meal.     omeprazole 20 MG capsule  Commonly known as:  PRILOSEC  Take 20 mg by mouth daily.     oxyCODONE-acetaminophen 5-325 MG per tablet  Commonly known as:  PERCOCET/ROXICET  Take 1-2 tablets by mouth every 4 (four) hours as needed for moderate pain.     pravastatin 40 MG tablet  Commonly known as:  PRAVACHOL  Take 40 mg by mouth at bedtime.     tamsulosin 0.4 MG Caps capsule  Commonly known as:  FLOMAX  Take 0.4 mg by mouth daily after breakfast.     VIAGRA 100 MG tablet  Generic drug:  sildenafil  Take 100 mg by mouth as needed for erectile dysfunction.         SignedJairo Ben 02/03/2015, 6:39 PM

## 2015-02-03 NOTE — Progress Notes (Signed)
Subjective:  The patient is somnolent but easily arousable. He is in no apparent distress. He looks well.  Objective: Vital signs in last 24 hours: Temp:  [98.3 F (36.8 C)] 98.3 F (36.8 C) (03/24 1219) Pulse Rate:  [84] 84 (03/24 1219) Resp:  [20] 20 (03/24 1219) BP: (172)/(96) 172/96 mmHg (03/24 1219) SpO2:  [100 %] 100 % (03/24 1219) Weight:  [86.955 kg (191 lb 11.2 oz)] 86.955 kg (191 lb 11.2 oz) (03/24 1219)  Intake/Output from previous day:   Intake/Output this shift:    Physical exam the patient is somnolent but arousable. He is moving his lower extremities well. His dressing is clean and dry.  Lab Results: No results for input(s): WBC, HGB, HCT, PLT in the last 72 hours. BMET No results for input(s): NA, K, CL, CO2, GLUCOSE, BUN, CREATININE, CALCIUM in the last 72 hours.  Studies/Results: No results found.  Assessment/Plan: The patient is doing well.  LOS: 0 days     Ellen Mayol D 02/03/2015, 1:11 PM

## 2015-02-03 NOTE — Anesthesia Procedure Notes (Signed)
Procedure Name: Intubation Date/Time: 02/03/2015 1:19 PM Performed by: Merrilyn Puma B Pre-anesthesia Checklist: Patient identified, Timeout performed, Emergency Drugs available, Suction available and Patient being monitored Patient Re-evaluated:Patient Re-evaluated prior to inductionOxygen Delivery Method: Circle system utilized Preoxygenation: Pre-oxygenation with 100% oxygen Intubation Type: IV induction and Cricoid Pressure applied Ventilation: Mask ventilation without difficulty Laryngoscope Size: Mac and 4 Grade View: Grade III Tube type: Oral Tube size: 7.5 mm Number of attempts: 1 Airway Equipment and Method: Stylet Placement Confirmation: CO2 detector,  positive ETCO2,  ETT inserted through vocal cords under direct vision and breath sounds checked- equal and bilateral Secured at: 23 cm Tube secured with: Tape Dental Injury: Teeth and Oropharynx as per pre-operative assessment

## 2015-02-03 NOTE — Discharge Instructions (Signed)

## 2015-02-03 NOTE — Progress Notes (Signed)
Subjective:  The patient is alert and pleasant. He is in no apparent distress. He looks well.  Objective: Vital signs in last 24 hours: Temp:  [98.3 F (36.8 C)] 98.3 F (36.8 C) (03/24 1509) Pulse Rate:  [84] 84 (03/24 1219) Resp:  [20] 20 (03/24 1219) BP: (172)/(96) 172/96 mmHg (03/24 1219) SpO2:  [100 %] 100 % (03/24 1219) Weight:  [86.955 kg (191 lb 11.2 oz)] 86.955 kg (191 lb 11.2 oz) (03/24 1219)  Intake/Output from previous day:   Intake/Output this shift: Total I/O In: 800 [I.V.:800] Out: 50 [Blood:50]  Physical exam the patient is alert and pleasant. He is moving his lower extremities well.  Lab Results: No results for input(s): WBC, HGB, HCT, PLT in the last 72 hours. BMET No results for input(s): NA, K, CL, CO2, GLUCOSE, BUN, CREATININE, CALCIUM in the last 72 hours.  Studies/Results: No results found.  Assessment/Plan: The patient is doing well.  LOS: 0 days     Elleanor Guyett D 02/03/2015, 3:15 PM

## 2015-02-03 NOTE — Op Note (Signed)
Brief history: The patient is a 64 year old white male who has complained of back and left leg pain consistent with a lumbar radiculopathy. He failed medical management and was worked up with a lumbar MRI. This demonstrated a L4-5 synovial cyst with spinal stenosis. I discussed the various treatment options with the patient including surgery. He has weighed the risks, benefits, and alternative surgery and decided proceed with a left L4-5 laminectomy with resection of synovial cyst.  Preoperative diagnosis: Left L4-5 synovial cyst, spinal stenosis, lumbago, lumbar radiculopathy  Postoperative diagnosis: The same  Procedure: Left L4 hemilaminectomy for resection of synovial cyst  using micro-dissection  Surgeon: Dr. Earle Gell  Asst.: Dr. Kathyrn Sheriff  Anesthesia: Gen. endotracheal  Estimated blood loss: Minimal  Drains: None  Complications: None  Description of procedure: The patient was brought to the operating room by the anesthesia team. General endotracheal anesthesia was induced. The patient was turned to the prone position on the Wilson frame. The patient's lumbosacral region was then prepared with Betadine scrub and Betadine solution. Sterile drapes were applied.  I then injected the area to be incised with Marcaine with epinephrine solution. I then used a scalpel to make a linear midline incision over the L4-5 intervertebral disc space. I then used electrocautery to perform a left sided subperiosteal dissection exposing the spinous process and lamina of L4 and L5. We obtained intraoperative radiograph to confirm our location. I then inserted the [facility] retractor for exposure.  We then brought the operative microscope into the field. Under its magnification and illumination we completed the microdissection. I used a high-speed drill to perform a laminotomy at L4 and the left as well as remove the cephalad aspect of the L5 lamina.. I then used a Kerrison punches to complete the left  L4 hemilaminectomy and to widen the laminotomy and removed the ligamentum flavum at L4-5. We then used microdissection to free up the thecal sac and the left L5 nerve root from the epidural tissue. There was a synovial cyst as expected. I freed up from the thecal sac and nerve root using microdissection. I then used a Kerrison punch to perform a foraminotomy at about the left L5 nerve root and to remove the synovial cyst. I then palpated along the ventral surface of the thecal sac and along exit route of the left L5 nerve root and noted that the neural structures were well decompressed. This completed the decompression.  We then obtained hemostasis using bipolar electrocautery. We irrigated the wound out with bacitracin solution. We then removed the retractor. We then reapproximated the patient's thoracolumbar fascia with interrupted #1 Vicryl suture. We then reapproximated the patient's subcutaneous tissue with interrupted 2-0 Vicryl suture. We then reapproximated patient's skin with Steri-Strips and benzoin. The was then coated with bacitracin ointment. The drapes were removed. The patient was subsequently returned to the supine position where they were extubated by the anesthesia team. The patient was then transported to the postanesthesia care unit in stable condition. All sponge instrument and needle counts were reportedly correct at the end of this case.

## 2015-02-03 NOTE — Anesthesia Postprocedure Evaluation (Signed)
Anesthesia Post Note  Patient: Sergio Callahan  Procedure(s) Performed: Procedure(s) (LRB): LUMBAR LAMINECTOMY/DECOMPRESSION MICRODISCECTOMY LEFT LUMBAR FOUR-FIVE WITH REMOVAL OF SYNOVIAL CYST (Left)  Anesthesia type: general  Patient location: PACU  Post pain: Pain level controlled  Post assessment: Patient's Cardiovascular Status Stable  Last Vitals:  Filed Vitals:   02/03/15 1610  BP: 161/95  Pulse: 74  Temp: 36.9 C  Resp: 20    Post vital signs: Reviewed and stable  Level of consciousness: sedated  Complications: No apparent anesthesia complications

## 2015-02-03 NOTE — Progress Notes (Signed)
Patient alert and oriented, mae's well, voiding adequate amount of urine, swallowing without difficulty, no c/o pain. Patient discharged home with family. Script and discharged instructions given to patient. Patient and family stated understanding of d/c instructions given and has an appointment with MD. Aisha Shylo Dillenbeck RN 

## 2015-02-03 NOTE — H&P (Signed)
Subjective: The patient is a 65 year old white male who has complained of back and left leg pain consistent with a lumbar radiculopathy. He has failed medical management and was worked up with a lumbar MRI which demonstrated L4-5 synovial cyst on the left with spinal stenosis. I discussed the various treatment options with the patient including surgery. He has decided proceed with a lumbar laminectomy and removal of the synovial cyst.  Past Medical History  Diagnosis Date  . Anxiety     takes Xanax daily as needed  . Hypertension     takes Lisinopril-HCTZ and Metoprolol daily  . Hyperlipidemia     takes Pravastatin nightly  . GERD (gastroesophageal reflux disease)     takes Omeprazole daily  . Enlarged prostate     takes Flomax daily-slightly  . Coronary artery disease   . Numbness and tingling     left hip/leg/foot  . Lupus     takes Plaquenil and Imran daily  . Arthritis     back   . Back pain     synovial cyst  . History of colon polyps     benign  . Urinary frequency   . Nocturia   . Anemia     takes B12 injections monthly  . History of shingles     Past Surgical History  Procedure Laterality Date  . Cardiac catheterization  2012  . Coronary angioplasty      1 stent  . Colonoscopy    . Esophagogastroduodenoscopy      Allergies  Allergen Reactions  . Fosamax [Alendronate Sodium]     Leg pain    History  Substance Use Topics  . Smoking status: Current Every Day Smoker -- 1.50 packs/day for 30 years  . Smokeless tobacco: Not on file  . Alcohol Use: Yes     Comment: cocktails occasionally    History reviewed. No pertinent family history. Prior to Admission medications   Medication Sig Start Date End Date Taking? Authorizing Provider  ALPRAZolam Duanne Moron) 0.5 MG tablet Take 0.5 mg by mouth at bedtime as needed for anxiety.   Yes Historical Provider, MD  aspirin 81 MG EC tablet Take 81 mg by mouth daily. Swallow whole.   Yes Historical Provider, MD  azaTHIOprine  (IMURAN) 50 MG tablet Take 100 mg by mouth daily.   Yes Historical Provider, MD  calcium carbonate (OS-CAL - DOSED IN MG OF ELEMENTAL CALCIUM) 1250 (500 CA) MG tablet Take 1 tablet by mouth 2 (two) times daily with a meal.   Yes Historical Provider, MD  cyanocobalamin (,VITAMIN B-12,) 1000 MCG/ML injection Inject 1,000 mcg into the muscle every 30 (thirty) days.   Yes Historical Provider, MD  HYDROcodone-acetaminophen (NORCO/VICODIN) 5-325 MG per tablet Take 1 tablet by mouth every 4 (four) hours as needed for moderate pain.   Yes Historical Provider, MD  hydroxychloroquine (PLAQUENIL) 200 MG tablet Take 200 mg by mouth 2 (two) times daily.   Yes Historical Provider, MD  lisinopril-hydrochlorothiazide (PRINZIDE,ZESTORETIC) 20-25 MG per tablet Take 1 tablet by mouth daily.   Yes Historical Provider, MD  metoprolol succinate (TOPROL-XL) 100 MG 24 hr tablet Take 100 mg by mouth at bedtime. Take with or immediately following a meal.   Yes Historical Provider, MD  omeprazole (PRILOSEC) 20 MG capsule Take 20 mg by mouth daily.   Yes Historical Provider, MD  pravastatin (PRAVACHOL) 40 MG tablet Take 40 mg by mouth at bedtime.   Yes Historical Provider, MD  tamsulosin (FLOMAX) 0.4 MG CAPS capsule  Take 0.4 mg by mouth daily after breakfast.   Yes Historical Provider, MD  VIAGRA 100 MG tablet Take 100 mg by mouth as needed for erectile dysfunction.  01/06/15  Yes Historical Provider, MD  fluticasone (FLONASE) 50 MCG/ACT nasal spray Place 1 spray into both nostrils daily as needed for allergies.  12/09/14   Historical Provider, MD     Review of Systems  Positive ROS: As above  All other systems have been reviewed and were otherwise negative with the exception of those mentioned in the HPI and as above.  Objective: Vital signs in last 24 hours: Temp:  [98.3 F (36.8 C)] 98.3 F (36.8 C) (03/24 1219) Pulse Rate:  [84] 84 (03/24 1219) Resp:  [20] 20 (03/24 1219) BP: (172)/(96) 172/96 mmHg (03/24  1219) SpO2:  [100 %] 100 % (03/24 1219) Weight:  [86.955 kg (191 lb 11.2 oz)] 86.955 kg (191 lb 11.2 oz) (03/24 1219)  General Appearance: Alert, cooperative, no distress, Head: Normocephalic, without obvious abnormality, atraumatic Eyes: PERRL, conjunctiva/corneas clear, EOM's intact,    Ears: Normal  Throat: Normal  Neck: Supple, symmetrical, trachea midline, no adenopathy; thyroid: No enlargement/tenderness/nodules; no carotid bruit or JVD Back: Symmetric, no curvature, ROM normal, no CVA tenderness Lungs: Clear to auscultation bilaterally, respirations unlabored Heart: Regular rate and rhythm, no murmur, rub or gallop Abdomen: Soft, non-tender,, no masses, no organomegaly Extremities: Extremities normal, atraumatic, no cyanosis or edema Pulses: 2+ and symmetric all extremities Skin: Skin color, texture, turgor normal, no rashes or lesions  NEUROLOGIC:   Mental status: alert and oriented, no aphasia, good attention span, Fund of knowledge/ memory ok Motor Exam - grossly normal Sensory Exam - grossly normal Reflexes:  Coordination - grossly normal Gait - grossly normal Balance - grossly normal Cranial Nerves: I: smell Not tested  II: visual acuity  OS: Normal  OD: Normal   II: visual fields Full to confrontation  II: pupils Equal, round, reactive to light  III,VII: ptosis None  III,IV,VI: extraocular muscles  Full ROM  V: mastication Normal  V: facial light touch sensation  Normal  V,VII: corneal reflex  Present  VII: facial muscle function - upper  Normal  VII: facial muscle function - lower Normal  VIII: hearing Not tested  IX: soft palate elevation  Normal  IX,X: gag reflex Present  XI: trapezius strength  5/5  XI: sternocleidomastoid strength 5/5  XI: neck flexion strength  5/5  XII: tongue strength  Normal    Data Review Lab Results  Component Value Date   WBC 4.1 01/25/2015   HGB 15.9 01/25/2015   HCT 43.5 01/25/2015   MCV 95.6 01/25/2015   PLT 239  01/25/2015   Lab Results  Component Value Date   NA 127* 01/25/2015   K 4.0 01/25/2015   CL 91* 01/25/2015   CO2 28 01/25/2015   BUN 9 01/25/2015   CREATININE 0.89 01/25/2015   GLUCOSE 98 01/25/2015   No results found for: INR, PROTIME  Assessment/Plan: Left L4-5 synovial cyst, lumbago, lumbar radiculopathy, lumbar spinal stenosis, neurogenic claudication: I have discussed the situation with the patient. I have reviewed his imaging studies with him and pointed out the abnormalities. We have discussed the various treatment options including surgery. I have described the surgical treatment option of a left L4-5 laminectomy with removal of synovial cyst. I have shown him surgical models. We have discussed the risks, benefits, alternatives, and likelihood of achieving our goals with surgery. I have answered all the patient's questions. He  has decided to proceed with surgery.   Newman Pies D 02/03/2015 12:55 PM

## 2015-02-03 NOTE — Transfer of Care (Signed)
Immediate Anesthesia Transfer of Care Note  Patient: Sergio Callahan  Procedure(s) Performed: Procedure(s): LUMBAR LAMINECTOMY/DECOMPRESSION MICRODISCECTOMY LEFT LUMBAR FOUR-FIVE WITH REMOVAL OF SYNOVIAL CYST (Left)  Patient Location: PACU  Anesthesia Type:General  Level of Consciousness: awake, alert  and oriented  Airway & Oxygen Therapy: Patient Spontanous Breathing and Patient connected to nasal cannula oxygen  Post-op Assessment: Report given to RN, Post -op Vital signs reviewed and stable and Patient moving all extremities X 4  Post vital signs: Reviewed and stable  Last Vitals:  Filed Vitals:   02/03/15 1509  BP:   Pulse:   Temp: 36.8 C  Resp:   HR 84, sats 98% on 2L, RR 14  Complications: No apparent anesthesia complications

## 2015-02-03 NOTE — Progress Notes (Signed)
Utilization review completed.  

## 2015-02-03 NOTE — Anesthesia Preprocedure Evaluation (Addendum)
Anesthesia Evaluation  Patient identified by MRN, date of birth, ID band Patient awake    Reviewed: Allergy & Precautions, NPO status , Patient's Chart, lab work & pertinent test results, reviewed documented beta blocker date and time   History of Anesthesia Complications Negative for: history of anesthetic complications  Airway Mallampati: I  TM Distance: >3 FB Neck ROM: Full    Dental  (+) Dental Advisory Given, Teeth Intact   Pulmonary Current Smoker,          Cardiovascular hypertension, Pt. on medications and Pt. on home beta blockers + CAD and + Cardiac Stents     Neuro/Psych Anxiety negative neurological ROS     GI/Hepatic Neg liver ROS, GERD-  ,  Endo/Other  negative endocrine ROS  Renal/GU negative Renal ROS     Musculoskeletal  (+) Arthritis -,   Abdominal   Peds  Hematology   Anesthesia Other Findings   Reproductive/Obstetrics                            Anesthesia Physical Anesthesia Plan  ASA: III  Anesthesia Plan: General   Post-op Pain Management:    Induction: Intravenous  Airway Management Planned: Oral ETT  Additional Equipment:   Intra-op Plan:   Post-operative Plan: Extubation in OR  Informed Consent:   Plan Discussed with:   Anesthesia Plan Comments:         Anesthesia Quick Evaluation

## 2015-02-07 ENCOUNTER — Encounter (HOSPITAL_COMMUNITY): Payer: Self-pay | Admitting: Neurosurgery

## 2015-09-15 DIAGNOSIS — Z87442 Personal history of urinary calculi: Secondary | ICD-10-CM | POA: Insufficient documentation

## 2015-09-15 DIAGNOSIS — N138 Other obstructive and reflux uropathy: Secondary | ICD-10-CM | POA: Insufficient documentation

## 2015-09-15 DIAGNOSIS — N529 Male erectile dysfunction, unspecified: Secondary | ICD-10-CM | POA: Insufficient documentation

## 2015-09-15 DIAGNOSIS — A630 Anogenital (venereal) warts: Secondary | ICD-10-CM | POA: Insufficient documentation

## 2015-09-15 DIAGNOSIS — N401 Enlarged prostate with lower urinary tract symptoms: Secondary | ICD-10-CM | POA: Insufficient documentation

## 2015-09-15 DIAGNOSIS — R3129 Other microscopic hematuria: Secondary | ICD-10-CM | POA: Insufficient documentation

## 2017-12-17 ENCOUNTER — Ambulatory Visit (INDEPENDENT_AMBULATORY_CARE_PROVIDER_SITE_OTHER): Payer: BLUE CROSS/BLUE SHIELD | Admitting: Vascular Surgery

## 2017-12-17 ENCOUNTER — Encounter (INDEPENDENT_AMBULATORY_CARE_PROVIDER_SITE_OTHER): Payer: Self-pay | Admitting: Vascular Surgery

## 2017-12-17 VITALS — BP 136/78 | HR 94 | Resp 17 | Ht 72.0 in | Wt 186.0 lb

## 2017-12-17 DIAGNOSIS — E785 Hyperlipidemia, unspecified: Secondary | ICD-10-CM | POA: Diagnosis not present

## 2017-12-17 DIAGNOSIS — I739 Peripheral vascular disease, unspecified: Secondary | ICD-10-CM | POA: Diagnosis not present

## 2017-12-17 DIAGNOSIS — L930 Discoid lupus erythematosus: Secondary | ICD-10-CM | POA: Insufficient documentation

## 2017-12-17 DIAGNOSIS — I70219 Atherosclerosis of native arteries of extremities with intermittent claudication, unspecified extremity: Secondary | ICD-10-CM | POA: Insufficient documentation

## 2017-12-17 DIAGNOSIS — I10 Essential (primary) hypertension: Secondary | ICD-10-CM | POA: Insufficient documentation

## 2017-12-17 NOTE — Progress Notes (Signed)
Subjective:    Patient ID: Sergio Callahan, male    DOB: November 13, 1950, 67 y.o.   MRN: [id number] Chief Complaint  Patient presents with  . New Patient (Initial Visit)    PVD   Presents as a new patient referred by Dr. Jefm Bryant to rule out peripheral artery disease.  The patient endorses a past medical history of lupus.  The patient also endorses a history of degenerative disc disease consisting of a "bulging disc" at L4-L5 for which she underwent surgery approximately 1 year ago.  The patient will be following up with Dr. Arnoldo Morale (neurosurgeon) on December 31, 2017 to review MRI to discuss the possibility of additional spinal surgery.  The patient reports a progressively worsening claudication to the right hip.  The patient experiences discomfort to the right hip with activity.  The patient must stop the activity that he is engaged in for a few seconds for the discomfort to resolve.  The patient also notes a "heavy feeling" to the bilateral lower extremity, right lower extremity greater than left lower extremity.  He denies any rest pain or ulceration to the bilateral lower extremity.  The patient is anxious to have his vascular studies completed before he meets with his neurosurgeon as he feels this is an important part of his workup.  The patient denies any fever, nausea or vomiting.   Review of Systems  Constitutional: Negative.   HENT: Negative.   Eyes: Negative.   Respiratory: Negative.   Cardiovascular:       Bilateral lower extremity weakness Right hip claudication  Gastrointestinal: Negative.   Endocrine: Negative.   Genitourinary: Negative.   Musculoskeletal: Negative.   Skin: Negative.   Allergic/Immunologic: Negative.   Neurological: Negative.   Hematological: Negative.   Psychiatric/Behavioral: Negative.       Objective:   Physical Exam  Constitutional: He is oriented to person, place, and time. He appears well-developed and well-nourished. No distress.  HENT:  Head:  Normocephalic and atraumatic.  Eyes: Conjunctivae are normal. Pupils are equal, round, and reactive to light.  Neck: Normal range of motion.  Cardiovascular: Normal rate, regular rhythm, normal heart sounds and intact distal pulses.  Pulses:      Radial pulses are 2+ on the right side, and 2+ on the left side.  Hard to palpate pedal pulses bilaterally however his feet are warm  Pulmonary/Chest: Effort normal and breath sounds normal.  Musculoskeletal: Normal range of motion. He exhibits no edema.  Neurological: He is alert and oriented to person, place, and time.  Skin: Skin is warm and dry. He is not diaphoretic.  Psychiatric: He has a normal mood and affect. His behavior is normal. Judgment and thought content normal.  Vitals reviewed.  BP 136/78 (BP Location: Left Arm, Patient Position: Sitting)   Pulse 94   Resp 17   Ht 6' (1.829 m)   Wt 186 lb (84.4 kg)   BMI 25.23 kg/m   Past Medical History:  Diagnosis Date  . Anemia    takes B12 injections monthly  . Anxiety    takes Xanax daily as needed  . Arthritis    back   . Back pain    synovial cyst  . Coronary artery disease   . Enlarged prostate    takes Flomax daily-slightly  . GERD (gastroesophageal reflux disease)    takes Omeprazole daily  . History of colon polyps    benign  . History of shingles   . Hyperlipidemia    takes  Pravastatin nightly  . Hypertension    takes Lisinopril-HCTZ and Metoprolol daily  . Lupus    takes Plaquenil and Imran daily  . Nocturia   . Numbness and tingling    left hip/leg/foot  . Urinary frequency    Social History   Socioeconomic History  . Marital status: Married    Spouse name: Not on file  . Number of children: Not on file  . Years of education: Not on file  . Highest education level: Not on file  Social Needs  . Financial resource strain: Not on file  . Food insecurity - worry: Not on file  . Food insecurity - inability: Not on file  . Transportation needs -  medical: Not on file  . Transportation needs - non-medical: Not on file  Occupational History  . Not on file  Tobacco Use  . Smoking status: Current Every Day Smoker    Packs/day: 1.50    Years: 30.00    Pack years: 45.00  . Smokeless tobacco: Never Used  Substance and Sexual Activity  . Alcohol use: Yes    Comment: cocktails occasionally  . Drug use: No  . Sexual activity: Yes  Other Topics Concern  . Not on file  Social History Narrative  . Not on file   Past Surgical History:  Procedure Laterality Date  . CARDIAC CATHETERIZATION  2012  . COLONOSCOPY    . CORONARY ANGIOPLASTY     1 stent  . ESOPHAGOGASTRODUODENOSCOPY    . LUMBAR LAMINECTOMY/DECOMPRESSION MICRODISCECTOMY Left 02/03/2015   Procedure: LUMBAR LAMINECTOMY/DECOMPRESSION MICRODISCECTOMY LEFT LUMBAR FOUR-FIVE WITH REMOVAL OF SYNOVIAL CYST;  Surgeon: Newman Pies, MD;  Location: [facility];  Service: Neurosurgery;  Laterality: Left;   History reviewed. No pertinent family history.  Allergies  Allergen Reactions  . Fosamax [Alendronate Sodium]     Leg pain      Assessment & Plan:  Presents as a new patient referred by Dr. Jefm Bryant to rule out peripheral artery disease.  The patient endorses a past medical history of lupus.  The patient also endorses a history of degenerative disc disease consisting of a "bulging disc" at L4-L5 for which she underwent surgery approximately 1 year ago.  The patient will be following up with Dr. Arnoldo Morale (neurosurgeon) on December 31, 2017 to review MRI to discuss the possibility of additional spinal surgery.  The patient reports a progressively worsening claudication to the right hip.  The patient experiences discomfort to the right hip with activity.  The patient must stop the activity that he is engaged in for a few seconds for the discomfort to resolve.  The patient also notes a "heavy feeling" to the bilateral lower extremity, right lower extremity greater than left lower  extremity.  He denies any rest pain or ulceration to the bilateral lower extremity.  The patient is anxious to have his vascular studies completed before he meets with his neurosurgeon as he feels this is an important part of his workup.  The patient denies any fever, nausea or vomiting.  1. Claudication [facility]) - New Patient with lifestyle limiting claudication to the right hip and overall weakness to the bilateral lower extremity The patient does have a past medical history of degenerative disc disease to the spine for which she is following up with his neurosurgeon Dr. Arnoldo Morale on December 31, 2017 At Dr. Jefm Bryant and the patient would like to rule out any treating peripheral artery disease I will bring the patient back ASAP to undergo an ABI  with common femoral and popliteal waveforms as per Einar Pheasant I have discussed with the patient at length the risk factors for and pathogenesis of atherosclerotic disease and encouraged a healthy diet, regular exercise regimen and blood pressure / glucose control.  The patient was encouraged to call the office in the interim if he experiences any claudication like symptoms, rest pain or ulcers to his feet / toes.  - VAS [place] ABI WITH/WO TBI; Future  2. Lupus erythematosus, unspecified form - Stable This is followed by Dr. Glenda Chroman that is mostly dermatologic  3. Essential hypertension - Stable Encouraged good control as its slows the progression of atherosclerotic disease  4. Hyperlipidemia, unspecified hyperlipidemia type - Stable Encouraged good control as its slows the progression of atherosclerotic disease  Current Outpatient Medications on File Prior to Visit  Medication Sig Dispense Refill  . ALPRAZolam (XANAX) 0.5 MG tablet Take 0.5 mg by mouth at bedtime as needed for anxiety.    Marland Kitchen aspirin 81 MG EC tablet Take 1 tablet (81 mg total) by mouth daily. Swallow whole. 30 tablet 12  . azaTHIOprine (IMURAN) 50 MG tablet Take 100 mg by mouth daily.    .  calcium carbonate (OS-CAL - DOSED IN MG OF ELEMENTAL CALCIUM) 1250 (500 CA) MG tablet Take 1 tablet by mouth 2 (two) times daily with a meal.    . cyanocobalamin (,VITAMIN B-12,) 1000 MCG/ML injection Inject 1,000 mcg into the muscle every 30 (thirty) days.    . fluticasone (FLONASE) 50 MCG/ACT nasal spray Place 1 spray into both nostrils daily as needed for allergies.   11  . hydroxychloroquine (PLAQUENIL) 200 MG tablet Take 200 mg by mouth 2 (two) times daily.    Marland Kitchen lisinopril-hydrochlorothiazide (PRINZIDE,ZESTORETIC) 20-25 MG per tablet Take 1 tablet by mouth daily.    . metoprolol succinate (TOPROL-XL) 100 MG 24 hr tablet Take 100 mg by mouth at bedtime. Take with or immediately following a meal.    . omeprazole (PRILOSEC) 20 MG capsule Take 20 mg by mouth daily.    Marland Kitchen oxyCODONE-acetaminophen (PERCOCET/ROXICET) 5-325 MG per tablet Take 1-2 tablets by mouth every 4 (four) hours as needed for moderate pain. 30 tablet 0  . pravastatin (PRAVACHOL) 40 MG tablet Take 40 mg by mouth at bedtime.    . tamsulosin (FLOMAX) 0.4 MG CAPS capsule Take 0.4 mg by mouth daily after breakfast.    . VIAGRA 100 MG tablet Take 100 mg by mouth as needed for erectile dysfunction.   12  . diazepam (VALIUM) 5 MG tablet Take 1 tablet (5 mg total) by mouth every 6 (six) hours as needed for muscle spasms. (Patient not taking: Reported on 12/17/2017) 30 tablet 0   No current facility-administered medications on file prior to visit.    There are no Patient Instructions on file for this visit. No Follow-up on file.  Troi Florendo A Torin Whisner, PA-C

## 2017-12-19 ENCOUNTER — Ambulatory Visit (INDEPENDENT_AMBULATORY_CARE_PROVIDER_SITE_OTHER): Payer: BLUE CROSS/BLUE SHIELD

## 2017-12-19 DIAGNOSIS — I739 Peripheral vascular disease, unspecified: Secondary | ICD-10-CM

## 2018-02-10 ENCOUNTER — Other Ambulatory Visit: Payer: Self-pay | Admitting: Rheumatology

## 2018-02-10 DIAGNOSIS — M25551 Pain in right hip: Principal | ICD-10-CM

## 2018-02-10 DIAGNOSIS — G8929 Other chronic pain: Secondary | ICD-10-CM

## 2018-02-15 ENCOUNTER — Ambulatory Visit: Payer: BLUE CROSS/BLUE SHIELD

## 2018-03-24 ENCOUNTER — Encounter (INDEPENDENT_AMBULATORY_CARE_PROVIDER_SITE_OTHER): Payer: Self-pay | Admitting: Vascular Surgery

## 2018-03-24 ENCOUNTER — Ambulatory Visit (INDEPENDENT_AMBULATORY_CARE_PROVIDER_SITE_OTHER): Payer: BLUE CROSS/BLUE SHIELD | Admitting: Vascular Surgery

## 2018-03-24 VITALS — BP 175/89 | HR 83 | Resp 16 | Ht 72.0 in | Wt 188.8 lb

## 2018-03-24 DIAGNOSIS — I10 Essential (primary) hypertension: Secondary | ICD-10-CM | POA: Diagnosis not present

## 2018-03-24 DIAGNOSIS — M79604 Pain in right leg: Secondary | ICD-10-CM | POA: Diagnosis not present

## 2018-03-24 DIAGNOSIS — E785 Hyperlipidemia, unspecified: Secondary | ICD-10-CM | POA: Diagnosis not present

## 2018-03-24 DIAGNOSIS — M159 Polyosteoarthritis, unspecified: Secondary | ICD-10-CM

## 2018-03-24 DIAGNOSIS — M1990 Unspecified osteoarthritis, unspecified site: Secondary | ICD-10-CM | POA: Insufficient documentation

## 2018-03-24 DIAGNOSIS — M150 Primary generalized (osteo)arthritis: Secondary | ICD-10-CM | POA: Diagnosis not present

## 2018-03-24 DIAGNOSIS — M79606 Pain in leg, unspecified: Secondary | ICD-10-CM | POA: Insufficient documentation

## 2018-03-24 NOTE — Progress Notes (Signed)
MRN : [id number]  Sergio Callahan is a 67 y.o. (1951-08-27) male who presents with chief complaint of  Chief Complaint  Patient presents with  . Follow-up    ref [place] hip claudication  .  History of Present Illness:  The patient is seen for evaluation of painful right lower extremity. Patient notes the pain is always associated with activity.  The pain is somewhat consistent day to day occurring on most days.  The pain has been progressive over the past year or so. The patient states these symptoms are causing  a profound negative impact on quality of life and daily activities.  The patient denies rest pain or dangling of an extremity off the side of the bed during the night for relief. No open wounds or sores at this time. No history of DVT or phlebitis. No prior interventions or surgeries.  There is a  history of back problems and DJD of the lumbar and sacral spine.  ABI's were obtained and they were normal     Current Meds  Medication Sig  . ALPRAZolam (XANAX) 0.5 MG tablet Take 0.5 mg by mouth at bedtime as needed for anxiety.  Marland Kitchen aspirin 81 MG EC tablet Take 1 tablet (81 mg total) by mouth daily. Swallow whole.  . azaTHIOprine (IMURAN) 50 MG tablet Take 100 mg by mouth daily.  . calcium carbonate (OS-CAL - DOSED IN MG OF ELEMENTAL CALCIUM) 1250 (500 CA) MG tablet Take 1 tablet by mouth 2 (two) times daily with a meal.  . cyanocobalamin (,VITAMIN B-12,) 1000 MCG/ML injection Inject 1,000 mcg into the muscle every 30 (thirty) days.  . fluticasone (FLONASE) 50 MCG/ACT nasal spray Place 1 spray into both nostrils daily as needed for allergies.   . hydroxychloroquine (PLAQUENIL) 200 MG tablet Take 200 mg by mouth 2 (two) times daily.  Marland Kitchen lisinopril-hydrochlorothiazide (PRINZIDE,ZESTORETIC) 20-25 MG per tablet Take 1 tablet by mouth daily.  . metoprolol succinate (TOPROL-XL) 100 MG 24 hr tablet Take 100 mg by mouth at bedtime. Take with or immediately following a meal.  .  omeprazole (PRILOSEC) 20 MG capsule Take 20 mg by mouth daily.  . pravastatin (PRAVACHOL) 40 MG tablet Take 40 mg by mouth at bedtime.  . tamsulosin (FLOMAX) 0.4 MG CAPS capsule Take 0.4 mg by mouth daily after breakfast.  . VIAGRA 100 MG tablet Take 100 mg by mouth as needed for erectile dysfunction.     Past Medical History:  Diagnosis Date  . Anemia    takes B12 injections monthly  . Anxiety    takes Xanax daily as needed  . Arthritis    back   . Back pain    synovial cyst  . Coronary artery disease   . Enlarged prostate    takes Flomax daily-slightly  . GERD (gastroesophageal reflux disease)    takes Omeprazole daily  . History of colon polyps    benign  . History of shingles   . Hyperlipidemia    takes Pravastatin nightly  . Hypertension    takes Lisinopril-HCTZ and Metoprolol daily  . Lupus ([place])    takes Plaquenil and Imran daily  . Nocturia   . Numbness and tingling    left hip/leg/foot  . Urinary frequency     Past Surgical History:  Procedure Laterality Date  . CARDIAC CATHETERIZATION  2012  . COLONOSCOPY    . CORONARY ANGIOPLASTY     1 stent  . ESOPHAGOGASTRODUODENOSCOPY    . LUMBAR LAMINECTOMY/DECOMPRESSION MICRODISCECTOMY Left  02/03/2015   Procedure: LUMBAR LAMINECTOMY/DECOMPRESSION MICRODISCECTOMY LEFT LUMBAR FOUR-FIVE WITH REMOVAL OF SYNOVIAL CYST;  Surgeon: Newman Pies, MD;  Location: [facility];  Service: Neurosurgery;  Laterality: Left;    Social History Social History   Tobacco Use  . Smoking status: Current Every Day Smoker    Packs/day: 1.50    Years: 30.00    Pack years: 45.00  . Smokeless tobacco: Never Used  Substance Use Topics  . Alcohol use: Yes    Comment: cocktails occasionally  . Drug use: No    Family History Family History  Problem Relation Age of Onset  . Hyperlipidemia Father   . Hypertension Father   . Diabetes Father   . Colon cancer Father   . Hearing loss Father   . Vascular Disease Father      Allergies  Allergen Reactions  . Fosamax [Alendronate Sodium]     Leg pain     REVIEW OF SYSTEMS (Negative unless checked)  Constitutional: [] Weight loss  [] Fever  [] Chills Cardiac: [] Chest pain   [] Chest pressure   [] Palpitations   [] Shortness of breath when laying flat   [] Shortness of breath with exertion. Vascular:  [x] Pain in leg with walking   [] Pain in legs at rest  [] History of DVT   [] Phlebitis   [] Swelling in legs   [] Varicose veins   [] Non-healing ulcers Pulmonary:   [] Uses home oxygen   [] Productive cough   [] Hemoptysis   [] Wheeze  [] COPD   [] Asthma Neurologic:  [] Dizziness   [] Seizures   [] History of stroke   [] History of TIA  [] Aphasia   [] Vissual changes   [] Weakness or numbness in arm   [] Weakness or numbness in leg Musculoskeletal:   [] Joint swelling   [x] Joint pain   [x] Low back pain Hematologic:  [] Easy bruising  [] Easy bleeding   [] Hypercoagulable state   [] Anemic Gastrointestinal:  [] Diarrhea   [] Vomiting  [] Gastroesophageal reflux/heartburn   [] Difficulty swallowing. Genitourinary:  [] Chronic kidney disease   [] Difficult urination  [] Frequent urination   [] Blood in urine Skin:  [] Rashes   [] Ulcers  Psychological:  [] History of anxiety   []  History of major depression.  Physical Examination  Vitals:   03/24/18 1153  BP: (!) 175/89  Pulse: 83  Resp: 16  Weight: 188 lb 12.8 oz (85.6 kg)  Height: 6' (1.829 m)   Body mass index is 25.61 kg/m. Gen: WD/WN, NAD Head: [place]/AT, No temporalis wasting.  Ear/Nose/Throat: Hearing grossly intact, nares w/o erythema or drainage Eyes: PER, EOMI, sclera nonicteric.  Neck: Supple, no large masses.   Pulmonary:  Good air movement, no audible wheezing bilaterally, no use of accessory muscles.  Cardiac: RRR, no JVD Vascular:  Vessel Right Left  Radial Palpable Palpable  PT Trace Palpable Trace Palpable  DP Trace Palpable Trace Palpable  Gastrointestinal: Non-distended. No guarding/no peritoneal signs.   Musculoskeletal: M/S 5/5 throughout.  No deformity or atrophy.  Neurologic: CN 2-12 intact. Symmetrical.  Speech is fluent. Motor exam as listed above. Psychiatric: Judgment intact, Mood & affect appropriate for pt's clinical situation. Dermatologic: No rashes or ulcers noted.  No changes consistent with cellulitis. Lymph : No lichenification or skin changes of chronic lymphedema.  CBC Lab Results  Component Value Date   WBC 4.1 01/25/2015   HGB 15.9 01/25/2015   HCT 43.5 01/25/2015   MCV 95.6 01/25/2015   PLT 239 01/25/2015    BMET    Component Value Date/Time   NA 127 (L) 01/25/2015 1036   K 4.0 01/25/2015  1036   CL 91 (L) 01/25/2015 1036   CO2 28 01/25/2015 1036   GLUCOSE 98 01/25/2015 1036   BUN 9 01/25/2015 1036   CREATININE 0.89 01/25/2015 1036   CALCIUM 9.2 01/25/2015 1036   GFRNONAA >90 01/25/2015 1036   GFRAA >90 01/25/2015 1036   CrCl cannot be calculated (Patient's most recent lab result is older than the maximum 21 days allowed.).  COAG No results found for: INR, PROTIME  Radiology No results found.   Assessment/Plan 1. Pain of right lower extremity Recommend:  The patient has experienced increased symptoms of the right groin area  and is now describing lifestyle limiting claudication symptoms.  ABI's were normal but this does not exclude a 50-60% stenosis.  Given the severity of the patient's lower extremity symptoms the patient should undergo CT angiography.  Risk and benefits were reviewed the patient.  Indications for the procedure were reviewed.  All questions were answered, the patient agrees to proceed.   The patient should continue walking and begin a more formal exercise program.   The patient should continue antiplatelet therapy and aggressive treatment of the lipid abnormalities  The patient will follow up with me after the angiogram.    A total of 70 minutes was spent with this patient and greater than 50% was spent in counseling and  coordination of care with the patient.  Discussion included the treatment options for vascular disease including indications for surgery and intervention.  Also discussed is the appropriate timing of treatment.  In addition medical therapy was discussed.  - CT ANGIO AO+BIFEM W & OR WO CONTRAST; Future  2. Primary osteoarthritis involving multiple joints Continue NSAID medications as already ordered, these medications have been reviewed and there are no changes at this time.  Continued activity and therapy was stressed.   3. Essential hypertension Continue antihypertensive medications as already ordered, these medications have been reviewed and there are no changes at this time.   4. Hyperlipidemia, unspecified hyperlipidemia type Continue statin as ordered and reviewed, no changes at this time    Hortencia Pilar, MD  03/24/2018 12:44 PM

## 2018-04-15 ENCOUNTER — Ambulatory Visit
Admission: RE | Admit: 2018-04-15 | Discharge: 2018-04-15 | Disposition: A | Discharge status: Home / Self Care | Payer: BLUE CROSS/BLUE SHIELD | Source: Ambulatory Visit | Attending: Vascular Surgery | Admitting: Vascular Surgery

## 2018-04-15 DIAGNOSIS — M79604 Pain in right leg: Secondary | ICD-10-CM | POA: Diagnosis present

## 2018-04-15 DIAGNOSIS — I708 Atherosclerosis of other arteries: Secondary | ICD-10-CM | POA: Diagnosis not present

## 2018-04-15 HISTORY — DX: Systemic involvement of connective tissue, unspecified: M35.9

## 2018-04-15 LAB — POCT I-STAT CREATININE: CREATININE: 0.7 mg/dL (ref 0.61–1.24)

## 2018-04-15 MED ORDER — IOPAMIDOL (ISOVUE-370) INJECTION 76%
125.0000 mL | Freq: Once | INTRAVENOUS | Status: AC | PRN
  Administered 2018-04-15: 125 mL via INTRAVENOUS

## 2018-04-21 ENCOUNTER — Encounter (INDEPENDENT_AMBULATORY_CARE_PROVIDER_SITE_OTHER): Payer: Self-pay

## 2018-04-21 ENCOUNTER — Encounter (INDEPENDENT_AMBULATORY_CARE_PROVIDER_SITE_OTHER): Payer: Self-pay | Admitting: Vascular Surgery

## 2018-04-21 ENCOUNTER — Ambulatory Visit (INDEPENDENT_AMBULATORY_CARE_PROVIDER_SITE_OTHER): Payer: BLUE CROSS/BLUE SHIELD | Admitting: Vascular Surgery

## 2018-04-21 VITALS — BP 173/93 | HR 81 | Resp 16 | Ht 72.0 in | Wt 191.2 lb

## 2018-04-21 DIAGNOSIS — E785 Hyperlipidemia, unspecified: Secondary | ICD-10-CM

## 2018-04-21 DIAGNOSIS — I70211 Atherosclerosis of native arteries of extremities with intermittent claudication, right leg: Secondary | ICD-10-CM | POA: Diagnosis not present

## 2018-04-21 DIAGNOSIS — I10 Essential (primary) hypertension: Secondary | ICD-10-CM

## 2018-04-21 DIAGNOSIS — M79604 Pain in right leg: Secondary | ICD-10-CM | POA: Diagnosis not present

## 2018-04-21 NOTE — Progress Notes (Signed)
MRN : [id number]  Sergio Callahan is a 67 y.o. (July 21, 1951) male who presents with chief complaint of  Chief Complaint  Patient presents with  . Follow-up    ct results  .  History of Present Illness: The patient returns to the office for followup and review of the CT angiogram. There continues to be severe pain with ambulation in the right lower extremity symptoms.  The patient notes interval shortening of their claudication distance and development of mild rest pain symptoms. No new ulcers or wounds have occurred since the last visit.  There have been no significant changes to the patient's overall health care.  The patient denies amaurosis fugax or recent TIA symptoms. There are no recent neurological changes noted. The patient denies history of DVT, PE or superficial thrombophlebitis. The patient denies recent episodes of angina or shortness of breath.   CT angiogram is reviewed by me and he has >60% right common iliac stenosis and what appears to be a >60% right internal iliac artery  Current Meds  Medication Sig  . ALPRAZolam (XANAX) 0.5 MG tablet Take 0.5 mg by mouth at bedtime as needed for anxiety.  Marland Kitchen aspirin 81 MG EC tablet Take 1 tablet (81 mg total) by mouth daily. Swallow whole.  . azaTHIOprine (IMURAN) 50 MG tablet Take 100 mg by mouth daily.  . calcium carbonate (OS-CAL - DOSED IN MG OF ELEMENTAL CALCIUM) 1250 (500 CA) MG tablet Take 1 tablet by mouth 2 (two) times daily with a meal.  . cyanocobalamin (,VITAMIN B-12,) 1000 MCG/ML injection Inject 1,000 mcg into the muscle every 30 (thirty) days.  . fluticasone (FLONASE) 50 MCG/ACT nasal spray Place 1 spray into both nostrils daily as needed for allergies.   . hydroxychloroquine (PLAQUENIL) 200 MG tablet Take 200 mg by mouth 2 (two) times daily.  Marland Kitchen lisinopril-hydrochlorothiazide (PRINZIDE,ZESTORETIC) 20-25 MG per tablet Take 1 tablet by mouth daily.  . metoprolol succinate (TOPROL-XL) 100 MG 24 hr tablet Take 100 mg by  mouth at bedtime. Take with or immediately following a meal.  . omeprazole (PRILOSEC) 20 MG capsule Take 20 mg by mouth daily.  . pravastatin (PRAVACHOL) 40 MG tablet Take 40 mg by mouth at bedtime.  . tamsulosin (FLOMAX) 0.4 MG CAPS capsule Take 0.4 mg by mouth daily after breakfast.  . VIAGRA 100 MG tablet Take 100 mg by mouth as needed for erectile dysfunction.     Past Medical History:  Diagnosis Date  . Anemia    takes B12 injections monthly  . Anxiety    takes Xanax daily as needed  . Arthritis    back   . Back pain    synovial cyst  . Collagen vascular disease (HCC)    Hx Lupus.  . Coronary artery disease   . Enlarged prostate    takes Flomax daily-slightly  . GERD (gastroesophageal reflux disease)    takes Omeprazole daily  . History of colon polyps    benign  . History of shingles   . Hyperlipidemia    takes Pravastatin nightly  . Hypertension    takes Lisinopril-HCTZ and Metoprolol daily  . Lupus ([place])    takes Plaquenil and Imran daily  . Nocturia   . Numbness and tingling    left hip/leg/foot  . Urinary frequency     Past Surgical History:  Procedure Laterality Date  . CARDIAC CATHETERIZATION  2012  . COLONOSCOPY    . CORONARY ANGIOPLASTY     1 stent  .  ESOPHAGOGASTRODUODENOSCOPY    . LUMBAR LAMINECTOMY/DECOMPRESSION MICRODISCECTOMY Left 02/03/2015   Procedure: LUMBAR LAMINECTOMY/DECOMPRESSION MICRODISCECTOMY LEFT LUMBAR FOUR-FIVE WITH REMOVAL OF SYNOVIAL CYST;  Surgeon: Newman Pies, MD;  Location: [facility];  Service: Neurosurgery;  Laterality: Left;    Social History Social History   Tobacco Use  . Smoking status: Current Every Day Smoker    Packs/day: 1.50    Years: 30.00    Pack years: 45.00  . Smokeless tobacco: Never Used  Substance Use Topics  . Alcohol use: Yes    Comment: cocktails occasionally  . Drug use: No    Family History Family History  Problem Relation Age of Onset  . Hyperlipidemia Father   . Hypertension Father    . Diabetes Father   . Colon cancer Father   . Hearing loss Father   . Vascular Disease Father     Allergies  Allergen Reactions  . Fosamax [Alendronate Sodium]     Leg pain     REVIEW OF SYSTEMS (Negative unless checked)  Constitutional: [] Weight loss  [] Fever  [] Chills Cardiac: [] Chest pain   [] Chest pressure   [] Palpitations   [] Shortness of breath when laying flat   [] Shortness of breath with exertion. Vascular:  [x] Pain in legs with walking   [x] Pain in legs at rest  [] History of DVT   [] Phlebitis   [] Swelling in legs   [] Varicose veins   [] Non-healing ulcers Pulmonary:   [] Uses home oxygen   [] Productive cough   [] Hemoptysis   [] Wheeze  [] COPD   [] Asthma Neurologic:  [] Dizziness   [] Seizures   [] History of stroke   [] History of TIA  [] Aphasia   [] Vissual changes   [] Weakness or numbness in arm   [] Weakness or numbness in leg Musculoskeletal:   [] Joint swelling   [x] Joint pain   [x] Low back pain Hematologic:  [] Easy bruising  [] Easy bleeding   [] Hypercoagulable state   [] Anemic Gastrointestinal:  [] Diarrhea   [] Vomiting  [] Gastroesophageal reflux/heartburn   [] Difficulty swallowing. Genitourinary:  [] Chronic kidney disease   [] Difficult urination  [] Frequent urination   [] Blood in urine Skin:  [] Rashes   [] Ulcers  Psychological:  [] History of anxiety   []  History of major depression.  Physical Examination  Vitals:   04/21/18 1006  BP: (!) 173/93  Pulse: 81  Resp: 16  Weight: 191 lb 3.2 oz (86.7 kg)  Height: 6' (1.829 m)   Body mass index is 25.93 kg/m. Gen: WD/WN, NAD Head: /AT, No temporalis wasting.  Ear/Nose/Throat: Hearing grossly intact, nares w/o erythema or drainage Eyes: PER, EOMI, sclera nonicteric.  Neck: Supple, no large masses.   Pulmonary:  Good air movement, no audible wheezing bilaterally, no use of accessory muscles.  Cardiac: RRR, no JVD Vascular:  Vessel Right Left  Radial Palpable Palpable  PT Trace Palpable Trace Palpable  DP Trace  Palpable Trace Palpable  Gastrointestinal: Non-distended. No guarding/no peritoneal signs.  Musculoskeletal: M/S 5/5 throughout.  No deformity or atrophy.  Neurologic: CN 2-12 intact. Symmetrical.  Speech is fluent. Motor exam as listed above. Psychiatric: Judgment intact, Mood & affect appropriate for pt's clinical situation. Dermatologic: No rashes or ulcers noted.  No changes consistent with cellulitis. Lymph : No lichenification or skin changes of chronic lymphedema.  CBC Lab Results  Component Value Date   WBC 4.1 01/25/2015   HGB 15.9 01/25/2015   HCT 43.5 01/25/2015   MCV 95.6 01/25/2015   PLT 239 01/25/2015    BMET    Component Value Date/Time   NA  127 (L) 01/25/2015 1036   K 4.0 01/25/2015 1036   CL 91 (L) 01/25/2015 1036   CO2 28 01/25/2015 1036   GLUCOSE 98 01/25/2015 1036   BUN 9 01/25/2015 1036   CREATININE 0.70 04/15/2018 0815   CALCIUM 9.2 01/25/2015 1036   GFRNONAA >90 01/25/2015 1036   GFRAA >90 01/25/2015 1036   Estimated Creatinine Clearance: 102.4 mL/min (by C-G formula based on SCr of 0.7 mg/dL).  COAG No results found for: INR, PROTIME  Radiology Ct Angio Ao+bifem W & Or Wo Contrast  Result Date: 04/15/2018 CLINICAL DATA:  67 year old with right groin and right lower extremity discomfort. Current smoker. EXAM: CT ANGIOGRAPHY OF ABDOMINAL AORTA WITH ILIOFEMORAL RUNOFF TECHNIQUE: Multidetector CT imaging of the abdomen, pelvis and lower extremities was performed using the standard protocol during bolus administration of intravenous contrast. Multiplanar CT image reconstructions and MIPs were obtained to evaluate the vascular anatomy. CONTRAST:  128mL ISOVUE-370 IOPAMIDOL (ISOVUE-370) INJECTION 76% COMPARISON:  01/09/2008 and PET-CT 01/27/2009 FINDINGS: VASCULAR Aorta: Atherosclerotic calcifications throughout the abdominal aorta without dissection or aneurysm. Celiac: Patent without evidence of aneurysm, dissection, vasculitis or significant stenosis. Mild  atherosclerotic disease in the proximal celiac trunk. SMA: SMA is patent with mild atherosclerotic disease. Focal ectasia related to atherosclerotic disease in the mid SMA on sequence 5, image 96 measuring up to 0.8 cm. No evidence for dissection. Renals: Bilateral renal arteries are patent without significant atherosclerotic disease or stenosis. No evidence for dissection or FMD. There is a small accessory left renal artery supplying the upper pole which is originating near the origin of the main left renal artery. IMA: IMA is patent without significant atherosclerotic disease or stenosis. RIGHT Lower Extremity Inflow: Large amount of calcified plaque at the origin and proximal aspect of the right common iliac artery. Proximal right common iliac artery is slightly irregular and cannot excluded a penetrating ulcer in this area. There appears to be greater than 50% narrowing of the lumen and suspect hemodynamically significant stenosis from this predominately calcified atherosclerotic lesion. Atherosclerotic disease involving the right hypogastric artery. Right hypogastric artery narrowing near the origin but patent. Right external iliac artery is tortuous but patent with mild atherosclerotic disease and no significant stenosis. Outflow: Mild atherosclerotic disease in the right common femoral artery without significant stenosis. Profunda femoral arteries are patent. Minimal narrowing in the proximal SMA related to mixed plaque. Right SFA is patent without significant stenosis. Mild atherosclerotic disease at the adductor canal. Mild atherosclerotic disease in the right popliteal artery without significant stenosis. Runoff: Three-vessel runoff in the right lower extremity. Dorsalis pedis artery and posterior tibial artery are patent at the ankle. LEFT Lower Extremity Inflow: Mild calcified plaque in left common iliac artery without significant stenosis. Left hypogastric artery is patent with mild atherosclerotic  disease. Left external iliac artery is tortuous but patent without significant atherosclerotic disease or stenosis. Outflow: Left common femoral artery is patent with mild atherosclerotic disease. The profunda femoral arteries are patent. Mild atherosclerotic disease in the left SFA without significant stenosis. No evidence for aneurysm or dissection. Mild atherosclerotic disease and narrowing at the adductor canal. Left popliteal artery is patent with mild atherosclerotic disease. Runoff: Three-vessel runoff in the left lower extremity. Mild atherosclerotic disease involving the runoff vessels. The dorsalis pedis artery and posterior tibial artery are patent at the ankle. Veins: No obvious venous abnormality within the limitations of this arterial phase study. Review of the MIP images confirms the above findings. NON-VASCULAR Lower chest: Hazy densities in the dependent  aspect of both lungs. Findings are suggestive for atelectasis. No large pleural effusions. Hepatobiliary: Normal appearance of the liver and gallbladder. No evidence for biliary dilatation. Pancreas: Unremarkable. No pancreatic ductal dilatation or surrounding inflammatory changes. Spleen: Normal in size without focal abnormality. Adrenals/Urinary Tract: Adrenal glands are normal. Urinary bladder is unremarkable. Negative for hydronephrosis. No suspicious renal lesions. Stomach/Bowel: Colonic diverticulosis without acute bowel inflammation. No evidence for bowel obstruction. Normal appendix. Stomach is unremarkable. Lymphatic: No lymph node enlargement in the abdomen or pelvis. Reproductive: Few calcifications associated with the prostate. Other: No ascites. Negative for free air. Increased subcutaneous calcifications along both sides of the back. Musculoskeletal: Again noted is a focal sclerotic density in the left iliac bone. This sclerotic lesion was present in 2010 although it has enlarged in size. Suspect a benign etiology such as a bone  island. IMPRESSION: VASCULAR Greater than 50% stenosis involving the origin and proximal right common iliac artery. This is predominantly calcified plaque and suspect this is hemodynamically significant. No significant outflow or runoff disease in the lower extremities. NON-VASCULAR No acute abnormality in the abdomen or pelvis. Electronically Signed   By: Markus Daft M.D.   On: 04/15/2018 10:49     Assessment/Plan 1. Pain of right lower extremity Recommend:  The patient has experienced increased symptoms and is now describing lifestyle limiting claudication and mild rest pain of the right leg specifically the right hip.   Given the severity of the patient's lower extremity symptoms the patient should undergo angiography and intervention, he will likely need bilateral iliac stenting and possible intervention of the right internal iliac.  Risk and benefits were reviewed the patient.  Indications for the procedure were reviewed.  All questions were answered, the patient agrees to proceed.   The patient should continue walking and begin a more formal exercise program.  The patient should continue antiplatelet therapy and aggressive treatment of the lipid abnormalities  The patient will follow up with me after the angiogram.   2. Atherosclerosis of native artery of right lower extremity with intermittent claudication ([place]) Se #1  3. Hyperlipidemia, unspecified hyperlipidemia type Continue statin as ordered and reviewed, no changes at this time  4. Essential hypertension Continue antihypertensive medications as already ordered, these medications have been reviewed and there are no changes at this time.     Hortencia Pilar, MD  04/21/2018 2:32 PM

## 2018-04-28 ENCOUNTER — Other Ambulatory Visit (INDEPENDENT_AMBULATORY_CARE_PROVIDER_SITE_OTHER): Payer: Self-pay | Admitting: Vascular Surgery

## 2018-05-02 ENCOUNTER — Encounter
Admission: RE | Disposition: A | Discharge status: Home / Self Care | Payer: Self-pay | Source: Ambulatory Visit | Attending: Vascular Surgery

## 2018-05-02 ENCOUNTER — Ambulatory Visit
Admission: RE | Admit: 2018-05-02 | Discharge: 2018-05-02 | Disposition: A | Discharge status: Home / Self Care | Payer: BLUE CROSS/BLUE SHIELD | Source: Ambulatory Visit | Attending: Vascular Surgery | Admitting: Vascular Surgery

## 2018-05-02 ENCOUNTER — Encounter: Payer: Self-pay | Admitting: *Deleted

## 2018-05-02 ENCOUNTER — Other Ambulatory Visit (INDEPENDENT_AMBULATORY_CARE_PROVIDER_SITE_OTHER): Payer: Self-pay

## 2018-05-02 DIAGNOSIS — I739 Peripheral vascular disease, unspecified: Secondary | ICD-10-CM

## 2018-05-02 DIAGNOSIS — I70212 Atherosclerosis of native arteries of extremities with intermittent claudication, left leg: Secondary | ICD-10-CM | POA: Insufficient documentation

## 2018-05-02 DIAGNOSIS — E785 Hyperlipidemia, unspecified: Secondary | ICD-10-CM | POA: Insufficient documentation

## 2018-05-02 DIAGNOSIS — I10 Essential (primary) hypertension: Secondary | ICD-10-CM | POA: Insufficient documentation

## 2018-05-02 DIAGNOSIS — L930 Discoid lupus erythematosus: Secondary | ICD-10-CM | POA: Diagnosis not present

## 2018-05-02 DIAGNOSIS — K219 Gastro-esophageal reflux disease without esophagitis: Secondary | ICD-10-CM | POA: Diagnosis not present

## 2018-05-02 DIAGNOSIS — F17210 Nicotine dependence, cigarettes, uncomplicated: Secondary | ICD-10-CM | POA: Insufficient documentation

## 2018-05-02 DIAGNOSIS — I2510 Atherosclerotic heart disease of native coronary artery without angina pectoris: Secondary | ICD-10-CM | POA: Diagnosis not present

## 2018-05-02 DIAGNOSIS — I70211 Atherosclerosis of native arteries of extremities with intermittent claudication, right leg: Secondary | ICD-10-CM

## 2018-05-02 DIAGNOSIS — I70213 Atherosclerosis of native arteries of extremities with intermittent claudication, bilateral legs: Secondary | ICD-10-CM | POA: Diagnosis not present

## 2018-05-02 DIAGNOSIS — I70221 Atherosclerosis of native arteries of extremities with rest pain, right leg: Secondary | ICD-10-CM | POA: Insufficient documentation

## 2018-05-02 HISTORY — DX: Peripheral vascular disease, unspecified: I73.9

## 2018-05-02 HISTORY — PX: PELVIC ANGIOGRAPHY: CATH118254

## 2018-05-02 LAB — BUN: BUN: 11 mg/dL (ref 6–20)

## 2018-05-02 LAB — CREATININE, SERUM
CREATININE: 0.72 mg/dL (ref 0.61–1.24)
GFR calc Af Amer: >60 mL/min (ref >60)
GFR calc non Af Amer: >60 mL/min (ref >60)

## 2018-05-02 SURGERY — PELVIC ANGIOGRAPHY
Anesthesia: Moderate Sedation

## 2018-05-02 MED ORDER — FENTANYL CITRATE (PF) 100 MCG/2ML IJ SOLN
INTRAMUSCULAR | Status: DC | PRN
  Administered 2018-05-02 (×4): 50 ug via INTRAVENOUS

## 2018-05-02 MED ORDER — CEFAZOLIN SODIUM-DEXTROSE 2-4 GM/100ML-% IV SOLN
2.0000 g | Freq: Once | INTRAVENOUS | Status: AC
  Administered 2018-05-02: 2 g via INTRAVENOUS

## 2018-05-02 MED ORDER — ONDANSETRON HCL 4 MG/2ML IJ SOLN: 4.0000 mg | Freq: Four times a day (QID) | INTRAMUSCULAR | Status: DC | PRN

## 2018-05-02 MED ORDER — METHYLPREDNISOLONE SODIUM SUCC 125 MG IJ SOLR: 125.0000 mg | INTRAMUSCULAR | Status: DC | PRN

## 2018-05-02 MED ORDER — SODIUM CHLORIDE 0.9 % IV SOLN
INTRAVENOUS | Status: DC
  Administered 2018-05-02: 12:00:00 via INTRAVENOUS

## 2018-05-02 MED ORDER — CLOPIDOGREL BISULFATE 75 MG PO TABS: 75.0000 mg | ORAL_TABLET | Freq: Every day | ORAL | 4 refills | Status: DC

## 2018-05-02 MED ORDER — HYDROMORPHONE HCL 1 MG/ML IJ SOLN
1.0000 mg | Freq: Once | INTRAMUSCULAR | Status: AC | PRN
Start: 2018-05-02 — End: 2018-05-02
  Administered 2018-05-02: 1 mg via INTRAVENOUS

## 2018-05-02 MED ORDER — ONDANSETRON HCL 4 MG/2ML IJ SOLN
4.0000 mg | Freq: Four times a day (QID) | INTRAMUSCULAR | Status: DC | PRN
  Administered 2018-05-02: 4 mg via INTRAVENOUS

## 2018-05-02 MED ORDER — SODIUM CHLORIDE 0.9% FLUSH: 3.0000 mL | Freq: Two times a day (BID) | INTRAVENOUS | Status: DC

## 2018-05-02 MED ORDER — MORPHINE SULFATE (PF) 4 MG/ML IV SOLN: 2.0000 mg | INTRAVENOUS | Status: DC | PRN

## 2018-05-02 MED ORDER — HEPARIN (PORCINE) IN NACL 1000-0.9 UT/500ML-% IV SOLN
INTRAVENOUS | Status: AC
  Filled 2018-05-02: qty 1000

## 2018-05-02 MED ORDER — LABETALOL HCL 5 MG/ML IV SOLN
INTRAVENOUS | Status: AC
  Filled 2018-05-02: qty 4

## 2018-05-02 MED ORDER — ACETAMINOPHEN 325 MG PO TABS: 650.0000 mg | ORAL_TABLET | ORAL | Status: DC | PRN

## 2018-05-02 MED ORDER — LABETALOL HCL 5 MG/ML IV SOLN: 10.0000 mg | INTRAVENOUS | Status: DC | PRN

## 2018-05-02 MED ORDER — HYDROMORPHONE HCL 1 MG/ML IJ SOLN
INTRAMUSCULAR | Status: AC
  Filled 2018-05-02: qty 1

## 2018-05-02 MED ORDER — MIDAZOLAM HCL 2 MG/2ML IJ SOLN
INTRAMUSCULAR | Status: DC | PRN
  Administered 2018-05-02: 2 mg via INTRAVENOUS
  Administered 2018-05-02 (×3): 1 mg via INTRAVENOUS

## 2018-05-02 MED ORDER — HYDRALAZINE HCL 20 MG/ML IJ SOLN
INTRAMUSCULAR | Status: AC
  Filled 2018-05-02: qty 1

## 2018-05-02 MED ORDER — ONDANSETRON HCL 4 MG/2ML IJ SOLN
INTRAMUSCULAR | Status: AC
  Filled 2018-05-02: qty 2

## 2018-05-02 MED ORDER — SODIUM CHLORIDE 0.9% FLUSH: 3.0000 mL | INTRAVENOUS | Status: DC | PRN

## 2018-05-02 MED ORDER — HEPARIN SODIUM (PORCINE) 1000 UNIT/ML IJ SOLN
INTRAMUSCULAR | Status: AC
  Filled 2018-05-02: qty 1

## 2018-05-02 MED ORDER — IOPAMIDOL (ISOVUE-300) INJECTION 61%
INTRAVENOUS | Status: DC | PRN
  Administered 2018-05-02: 65 mL via INTRAVENOUS

## 2018-05-02 MED ORDER — FENTANYL CITRATE (PF) 100 MCG/2ML IJ SOLN
INTRAMUSCULAR | Status: AC
  Filled 2018-05-02: qty 2

## 2018-05-02 MED ORDER — CLOPIDOGREL BISULFATE 75 MG PO TABS
ORAL_TABLET | ORAL | Status: AC
  Administered 2018-05-02: 300 mg via ORAL
  Filled 2018-05-02: qty 4

## 2018-05-02 MED ORDER — HEPARIN SODIUM (PORCINE) 1000 UNIT/ML IJ SOLN
INTRAMUSCULAR | Status: DC | PRN
  Administered 2018-05-02: 5000 [IU] via INTRAVENOUS

## 2018-05-02 MED ORDER — CLOPIDOGREL BISULFATE 300 MG PO TABS
300.0000 mg | ORAL_TABLET | ORAL | Status: AC
  Administered 2018-05-02: 300 mg via ORAL

## 2018-05-02 MED ORDER — MIDAZOLAM HCL 5 MG/5ML IJ SOLN
INTRAMUSCULAR | Status: AC
  Filled 2018-05-02: qty 5

## 2018-05-02 MED ORDER — SODIUM CHLORIDE 0.9 % IV SOLN: 250.0000 mL | INTRAVENOUS | Status: DC | PRN

## 2018-05-02 MED ORDER — FAMOTIDINE 20 MG PO TABS: 40.0000 mg | ORAL_TABLET | ORAL | Status: DC | PRN

## 2018-05-02 MED ORDER — LABETALOL HCL 5 MG/ML IV SOLN
INTRAVENOUS | Status: DC | PRN
  Administered 2018-05-02 (×2): 10 mg via INTRAVENOUS

## 2018-05-02 MED ORDER — SODIUM CHLORIDE 0.9 % IV SOLN: INTRAVENOUS | Status: DC

## 2018-05-02 MED ORDER — OXYCODONE HCL 5 MG PO TABS: 5.0000 mg | ORAL_TABLET | ORAL | Status: DC | PRN

## 2018-05-02 MED ORDER — LIDOCAINE HCL (PF) 1 % IJ SOLN
INTRAMUSCULAR | Status: AC
  Filled 2018-05-02: qty 30

## 2018-05-02 MED ORDER — HYDRALAZINE HCL 20 MG/ML IJ SOLN
5.0000 mg | INTRAMUSCULAR | Status: DC | PRN
  Administered 2018-05-02: 5 mg via INTRAVENOUS

## 2018-05-02 SURGICAL SUPPLY — 23 items
BALLN ATG 12X4X80 (BALLOONS) ×3
BALLN DORADO 8X60X80 (BALLOONS) ×3
BALLN ULTRVRSE 4X60X75 (BALLOONS) ×3
BALLOON ATG 12X4X80 (BALLOONS) ×1 IMPLANT
BALLOON DORADO 8X60X80 (BALLOONS) ×1 IMPLANT
BALLOON ULTRVRSE 4X60X75 (BALLOONS) ×1 IMPLANT
CATH BEACON 5 .035 40 KMP TP (CATHETERS) ×1 IMPLANT
CATH BEACON 5 .038 40 KMP TP (CATHETERS) ×2
CATH PIG 70CM (CATHETERS) ×3 IMPLANT
DEVICE PRESTO INFLATION (MISCELLANEOUS) ×6 IMPLANT
DEVICE STARCLOSE SE CLOSURE (Vascular Products) ×6 IMPLANT
DEVICE TORQUE .025-.038 (MISCELLANEOUS) ×3 IMPLANT
GLIDEWIRE STIFF .35X180X3 HYDR (WIRE) ×3 IMPLANT
NEEDLE ENTRY 21GA 7CM ECHOTIP (NEEDLE) ×3 IMPLANT
PACK ANGIOGRAPHY (CUSTOM PROCEDURE TRAY) ×3 IMPLANT
SET INTRO CAPELLA COAXIAL (SET/KITS/TRAYS/PACK) ×3 IMPLANT
SHEATH BRITE TIP 5FRX11 (SHEATH) ×3 IMPLANT
SHEATH BRITE TIP 8FRX11 (SHEATH) ×6 IMPLANT
STENT LIFESTREAM 12X38X80 (Permanent Stent) ×3 IMPLANT
STENT LIFESTREAM 12X58X80 (Permanent Stent) ×3 IMPLANT
TUBING CONTRAST HIGH PRESS 72 (TUBING) ×3 IMPLANT
WIRE J 3MM .035X145CM (WIRE) ×3 IMPLANT
WIRE MAGIC TOR.035 180C (WIRE) ×6 IMPLANT

## 2018-05-02 NOTE — Op Note (Signed)
[facility]  Percutaneous Study/Intervention Procedural Note   Date of Surgery: 05/02/2018  Surgeon:Schnier, Dolores Lory   Pre-operative Diagnosis: Atherosclerotic occlusive disease bilateral lower extremities with lifestyle limiting claudication and mild rest pain of the right lower extremity  Post-operative diagnosis:  Same  Procedure(s) Performed:  1.  Abdominal aortogram  2.  Bilateral distal runoff  3.  Percutaneous transluminal angioplasty and stent placement right common iliac artery; "kissing balloon" technique  4.  Percutaneous transluminal and plasty and stent placement left common iliac artery; "kissing balloon" technique  5.  Ultrasound guided access bilateral common femoral arteries  6.  StarClose closure device bilateral common femoral arteries  Anesthesia: Conscious sedation was administered under my direct supervision by the interventional radiology RN. IV Versed plus fentanyl were utilized. Continuous ECG, pulse oximetry and blood pressure was monitored throughout the entire procedure. Conscious sedation was for a total of 87 minutes.  Sheath: 8 French Pinnacle sheath right common femoral; 8 [place] Pinnacle sheath left common femoral  Contrast: 65 cc  Fluoroscopy Time: 5.8 minutes  Indications: Patient presents with increasing pain of his left hip and lifestyle limitation with his ambulation.  Noninvasive studies have demonstrated atherosclerotic occlusive disease and his CT angiogram suggested profound iliac stenosis.  Imaging was inadequate to characterize the inflow situation is now undergoing angiography and the hope for intervention.  The risks and benefits of been reviewed all questions been answered patient agrees to proceed.  Procedure:  Sergio Pounders Parkeris a 67 y.o. male who was identified and appropriate procedural time out was performed.  The patient was then placed supine on the table and prepped and draped in the usual sterile fashion.   Ultrasound was used to evaluate the left common femoral artery.  It was echolucent and pulsatile indicating it is patent .  An ultrasound image was acquired for the permanent record.  A micropuncture needle was used to access the left common femoral artery under direct ultrasound guidance.  The microwire was then advanced under fluoroscopic guidance without difficulty followed by the micro-sheath  A 0.035 J wire was advanced without resistance and a 5Fr sheath was placed.  This she is was later upsized to an 8 [place] sheath.  The pigtail catheter was then positioned at the level of T12 and an AP image of the aorta was obtained. After review the images the pigtail catheter was repositioned above the aortic bifurcation and bilateral oblique views of the pelvis were obtained. Subsequently the detector was returned to the AP position and bilateral lower extremity runoff was obtained.  After review the images the ultrasound was reprepped and delivered back onto the sterile field. The right common femoral was then imaged with the ultrasound it was noted to be echolucent and pulsatile indicating patency. Images recorded for the permanent record. Under real-time visualization a microneedle was inserted into the anterior wall the common femoral artery microwire was then advanced without difficulty under fluoroscopic guidance followed by placement of the micro-sheath.  A stiff glide wire and Kumpe catheter was then negotiated under fluoroscopic guidance into the aorta.  Hand-injection of contrast verified intraluminal placement and created an image with magnification to prepare for stent placement.  Magic torque wire was then advanced through the Kumpe catheter and the Kumpe catheter removed an 8 French sheath was then placed.  5000 units of heparin was given and allowed to circulate for proximally 4 minutes.  The left sheath was then upsized to a 8 [place] sheath as well after  a Magic torque wire was advanced through  the pigtail catheter. Magnified images of the aortic bifurcation were then made using hand injection contrast from the femoral sheaths. After appropriate sizing a 12 x 38 lifestream stent was selected for the right and a 12 x 58 lifestream stent was selected for the left. There were then advanced and positioned just above the aortic bifurcation. Insufflation for full expansion of the stents was performed simultaneously. Follow-up imaging was then performed and the 38 balloon was removed from the right and advanced up the left and a 12 x 40 Atlas balloon was advanced up the right.  A second inflation simultaneously was performed to 14 atm and this completely expanded the right iliac stent..  The pigtail catheter was then introduced up the mid infrarenal aorta and bolus injection of contrast was used to perform final imaging of the distal aortic reconstruction.  Oblique views were then obtained of the groins in succession and Star close device is deployed without difficulty. There were no immediate complications   Findings:   Aortogram:  The abdominal aorta is opacified with a bolus injection contrast. Demonstrates diffuse disease but there are no hemodynamically significant lesions noted until the distal aortic bifurcation where bilateral stenosis is identified on the right there is a subtotal occlusion and on the left there is a 70% ostial iliac lesions are identified. There is moderate poststenotic dilatation noted of the common iliac arteries as well.  Right Lower Extremity: The right common iliac demonstrates the subtotal occlusion proximally, distally although there is atherosclerotic changes there are no hemodynamically significant lesions.  The iliac bifurcation on the right is patent and the internal/external iliac arteries appear patent.  The right common femoral and profunda femoris are widely patent although there is atherosclerotic disease the SFA is patent and its visualized segment.  Left  Lower Extremity:  The left common iliac demonstrates the 70% stenosis proximally, distally although there is atherosclerotic changes there are no hemodynamically significant lesions.  The iliac bifurcation on the left is patent and the internal/external iliac arteries appear patent although there is a 60% stenosis at the ostia of the left internal iliac artery.  The left common femoral and profunda femoris are widely patent although there is atherosclerotic disease the SFA is patent and its visualized segment.  Following placement of the iliac stents there is now wide patency with less than 5% residual stenosis with rapid flow through the aortic bifurcation bilaterally.  Summary:  Successful reconstruction of the distal aorta and bilateral common iliac arteries  Disposition: Patient was taken to the recovery room in stable condition having tolerated the procedure well.  Belenda Cruise Schnier 05/02/2018,3:04 PM

## 2018-05-02 NOTE — H&P (Signed)
[facility] History & Physical Update  The patient was interviewed and re-examined.  The patient's previous History and Physical has been reviewed and is unchanged.  There is no change in the plan of care. We plan to proceed with the scheduled procedure.  Hortencia Pilar, MD  05/02/2018, 1:00 PM

## 2018-05-05 ENCOUNTER — Other Ambulatory Visit: Payer: Self-pay | Admitting: Surgery

## 2018-05-05 ENCOUNTER — Encounter: Payer: Self-pay | Admitting: Vascular Surgery

## 2018-05-05 DIAGNOSIS — M7581 Other shoulder lesions, right shoulder: Secondary | ICD-10-CM

## 2018-05-05 DIAGNOSIS — M75101 Unspecified rotator cuff tear or rupture of right shoulder, not specified as traumatic: Secondary | ICD-10-CM

## 2018-05-09 ENCOUNTER — Ambulatory Visit
Admission: RE | Admit: 2018-05-09 | Discharge: 2018-05-09 | Disposition: A | Discharge status: Home / Self Care | Payer: BLUE CROSS/BLUE SHIELD | Source: Ambulatory Visit | Attending: Vascular Surgery | Admitting: Vascular Surgery

## 2018-05-09 DIAGNOSIS — I70211 Atherosclerosis of native arteries of extremities with intermittent claudication, right leg: Secondary | ICD-10-CM | POA: Diagnosis not present

## 2018-05-09 DIAGNOSIS — I739 Peripheral vascular disease, unspecified: Secondary | ICD-10-CM

## 2018-05-12 ENCOUNTER — Encounter (INDEPENDENT_AMBULATORY_CARE_PROVIDER_SITE_OTHER): Payer: Self-pay | Admitting: Vascular Surgery

## 2018-05-12 ENCOUNTER — Ambulatory Visit (INDEPENDENT_AMBULATORY_CARE_PROVIDER_SITE_OTHER): Payer: BLUE CROSS/BLUE SHIELD | Admitting: Vascular Surgery

## 2018-05-12 VITALS — BP 153/85 | HR 92 | Resp 13 | Ht 72.0 in | Wt 186.0 lb

## 2018-05-12 DIAGNOSIS — I10 Essential (primary) hypertension: Secondary | ICD-10-CM | POA: Diagnosis not present

## 2018-05-12 DIAGNOSIS — E785 Hyperlipidemia, unspecified: Secondary | ICD-10-CM

## 2018-05-12 DIAGNOSIS — I70211 Atherosclerosis of native arteries of extremities with intermittent claudication, right leg: Secondary | ICD-10-CM | POA: Diagnosis not present

## 2018-05-13 ENCOUNTER — Encounter (INDEPENDENT_AMBULATORY_CARE_PROVIDER_SITE_OTHER): Payer: Self-pay | Admitting: Vascular Surgery

## 2018-05-13 NOTE — Progress Notes (Signed)
Subjective:    Patient ID: Sergio Callahan, male    DOB: 1951/02/16, 67 y.o.   MRN: [id number] Chief Complaint  Patient presents with  . Follow-up    ABI U/S follow up   Patient presents for his first post procedure follow-up.  The patient is status post a by lateral common iliac kissing balloon technique stent placement to the iliac arteries on May 02, 2018.  The patient reports that his postprocedure course has been unremarkable.  The patient notes that his claudication symptoms have resolved.  The patient denies any rest pain or ulceration to the bilateral lower extremity.  The patient is experience some ecchymosis of the groins.  The patient underwent an ABI conducted at [facility] radiology department which was notable for a resting ABI of the right lower extremity of 1.2 and of the left lower extremity of 1.16 no evidence of hemodynamically significant lower extremity arterial occlusive disease was noted.  This is an improvement when compared to the previous ABI completed before the patient's intervention.  The patient continues to take Plavix daily.  The patient denies any bleeding issues.  Patient denies any fever, nausea or vomiting.  Review of Systems  Constitutional: Negative.   HENT: Negative.   Eyes: Negative.   Respiratory: Negative.   Cardiovascular: Negative.   Gastrointestinal: Negative.   Endocrine: Negative.   Genitourinary: Negative.   Musculoskeletal: Negative.   Skin: Negative.   Allergic/Immunologic: Negative.   Neurological: Negative.   Hematological: Negative.   Psychiatric/Behavioral: Negative.       Objective:   Physical Exam  Constitutional: He is oriented to person, place, and time. He appears well-developed and well-nourished. No distress.  HENT:  Head: Normocephalic and atraumatic.  Right Ear: External ear normal.  Left Ear: External ear normal.  Eyes: Pupils are equal, round, and reactive to light. Conjunctivae and EOM are  normal.  Neck: Normal range of motion.  Cardiovascular: Normal rate, regular rhythm, normal heart sounds and intact distal pulses.  Pulses:      Radial pulses are 2+ on the right side, and 2+ on the left side.       Dorsalis pedis pulses are 2+ on the right side, and 2+ on the left side.       Posterior tibial pulses are 2+ on the right side, and 2+ on the left side.  Pulmonary/Chest: Effort normal and breath sounds normal.  Musculoskeletal: Normal range of motion. He exhibits no edema.  Neurological: He is alert and oriented to person, place, and time.  Skin: Skin is warm and dry. He is not diaphoretic.  Psychiatric: He has a normal mood and affect. His behavior is normal. Judgment and thought content normal.  Vitals reviewed.  BP (!) 153/85 (BP Location: Right Arm, Patient Position: Sitting)   Pulse 92   Resp 13   Ht 6' (1.829 m)   Wt 186 lb (84.4 kg)   BMI 25.23 kg/m   Past Medical History:  Diagnosis Date  . Anemia    takes B12 injections monthly  . Anxiety    takes Xanax daily as needed  . Arthritis    back   . Back pain    synovial cyst  . Collagen vascular disease (HCC)    Hx Lupus.  . Coronary artery disease   . Enlarged prostate    takes Flomax daily-slightly  . GERD (gastroesophageal reflux disease)    takes Omeprazole daily  . History of colon polyps  benign  . History of shingles   . Hyperlipidemia    takes Pravastatin nightly  . Hypertension    takes Lisinopril-HCTZ and Metoprolol daily  . Lupus ([place])    takes Plaquenil and Imran daily  . Nocturia   . Numbness and tingling    left hip/leg/foot  . Peripheral vascular disease ([place])   . Urinary frequency    Social History   Socioeconomic History  . Marital status: Married    Spouse name: Not on file  . Number of children: Not on file  . Years of education: Not on file  . Highest education level: Not on file  Occupational History  . Not on file  Social Needs  . Financial resource strain: Not  on file  . Food insecurity:    Worry: Not on file    Inability: Not on file  . Transportation needs:    Medical: Not on file    Non-medical: Not on file  Tobacco Use  . Smoking status: Current Every Day Smoker    Packs/day: 1.50    Years: 30.00    Pack years: 45.00  . Smokeless tobacco: Never Used  Substance and Sexual Activity  . Alcohol use: Yes    Comment: cocktails about 2 daily  . Drug use: No  . Sexual activity: Yes  Lifestyle  . Physical activity:    Days per week: Not on file    Minutes per session: Not on file  . Stress: Not on file  Relationships  . Social connections:    Talks on phone: Not on file    Gets together: Not on file    Attends religious service: Not on file    Active member of club or organization: Not on file    Attends meetings of clubs or organizations: Not on file    Relationship status: Not on file  . Intimate partner violence:    Fear of current or ex partner: Not on file    Emotionally abused: Not on file    Physically abused: Not on file    Forced sexual activity: Not on file  Other Topics Concern  . Not on file  Social History Narrative  . Not on file   Past Surgical History:  Procedure Laterality Date  . CARDIAC CATHETERIZATION  2012  . COLONOSCOPY    . CORONARY ANGIOPLASTY     1 stent  . ESOPHAGOGASTRODUODENOSCOPY    . LUMBAR LAMINECTOMY/DECOMPRESSION MICRODISCECTOMY Left 02/03/2015   Procedure: LUMBAR LAMINECTOMY/DECOMPRESSION MICRODISCECTOMY LEFT LUMBAR FOUR-FIVE WITH REMOVAL OF SYNOVIAL CYST;  Surgeon: Newman Pies, MD;  Location: [facility];  Service: Neurosurgery;  Laterality: Left;  . PELVIC ANGIOGRAPHY N/A 05/02/2018   Procedure: PELVIC ANGIOGRAPHY;  Surgeon: Katha Cabal, MD;  Location: [facility];  Service: Cardiovascular;  Laterality: N/A;   Family History  Problem Relation Age of Onset  . Hyperlipidemia Father   . Hypertension Father   . Diabetes Father   . Colon cancer Father   . Hearing loss  Father   . Vascular Disease Father    Allergies  Allergen Reactions  . Fosamax [Alendronate Sodium]     Leg pain      Assessment & Plan:  Patient presents for his first post procedure follow-up.  The patient is status post a by lateral common iliac kissing balloon technique stent placement to the iliac arteries on May 02, 2018.  The patient reports that his postprocedure course has been unremarkable.  The patient notes that  his claudication symptoms have resolved.  The patient denies any rest pain or ulceration to the bilateral lower extremity.  The patient is experience some ecchymosis of the groins.  The patient underwent an ABI conducted at [facility] radiology department which was notable for a resting ABI of the right lower extremity of 1.2 and of the left lower extremity of 1.16 no evidence of hemodynamically significant lower extremity arterial occlusive disease was noted.  This is an improvement when compared to the previous ABI completed before the patient's intervention.  The patient continues to take Plavix daily.  The patient denies any bleeding issues.  Patient denies any fever, nausea or vomiting.  1. Atherosclerosis of native artery of right lower extremity with intermittent claudication ([place]) - Stable Patient with improvement to his claudication symptoms status post his recent endovascular intervention The patient is to continue taking Plavix for at least 3 months.  As per Dr. Delana Meyer, it is okay if the patient wishes to stop his Plavix at 3 months. The patient is to follow-up in 6 months and undergo an ABI and an aortoiliac to surveilled his peripheral artery disease Patient to remain abstinent of tobacco use. I have discussed with the patient at length the risk factors for and pathogenesis of atherosclerotic disease and encouraged a healthy diet, regular exercise regimen and blood pressure / glucose control.  The patient was encouraged to call the office  in the interim if he experiences any claudication like symptoms, rest pain or ulcers to his feet / toes.  - VAS [place] ABI WITH/WO TBI; Future - VAS US AORTA/IVC/ILIACS; Future  2. Hyperlipidemia, unspecified hyperlipidemia type - Stable Encouraged good control as its slows the progression of atherosclerotic disease  3. Essential hypertension - Stable Encouraged good control as its slows the progression of atherosclerotic disease  Current Outpatient Medications on File Prior to Visit  Medication Sig Dispense Refill  . ALPRAZolam (XANAX) 0.5 MG tablet Take 0.5 mg by mouth at bedtime as needed for anxiety.    Marland Kitchen aspirin 81 MG EC tablet Take 1 tablet (81 mg total) by mouth daily. Swallow whole. 30 tablet 12  . augmented betamethasone dipropionate (DIPROLENE-AF) 0.05 % cream Apply 1 application topically 2 (two) times daily as needed. Apply to affected areas on leg. Avoid face and genitals  0  . azaTHIOprine (IMURAN) 50 MG tablet Take 50 mg by mouth 2 (two) times daily.     . Calcium Carbonate (CALCIUM 600 PO) Take 600 mg by mouth daily.    . clobetasol (TEMOVATE) 0.05 % external solution Apply 1 application topically daily as needed (itching). Apply to scalp  2  . clopidogrel (PLAVIX) 75 MG tablet Take 1 tablet (75 mg total) by mouth daily. 30 tablet 4  . cyanocobalamin (,VITAMIN B-12,) 1000 MCG/ML injection Inject 1,000 mcg into the muscle every 14 (fourteen) days.     . fluticasone (FLONASE) 50 MCG/ACT nasal spray Place 1 spray into both nostrils daily as needed for allergies.   11  . hydroxychloroquine (PLAQUENIL) 200 MG tablet Take 200 mg by mouth 2 (two) times daily.    Marland Kitchen ibuprofen (ADVIL,MOTRIN) 200 MG tablet Take 400 mg by mouth every 6 (six) hours as needed for headache or moderate pain.    Marland Kitchen lisinopril-hydrochlorothiazide (PRINZIDE,ZESTORETIC) 20-25 MG per tablet Take 1 tablet by mouth daily.    . metoprolol succinate (TOPROL-XL) 100 MG 24 hr tablet Take 100 mg by mouth at bedtime. Take  with or immediately following a meal.    .  omeprazole (PRILOSEC) 20 MG capsule Take 20 mg by mouth daily.    . pravastatin (PRAVACHOL) 40 MG tablet Take 40 mg by mouth at bedtime.    . sildenafil (REVATIO) 20 MG tablet Take 20 mg by mouth daily as needed for erectile dysfunction.   5  . tamsulosin (FLOMAX) 0.4 MG CAPS capsule Take 0.4 mg by mouth daily after breakfast.    . VIAGRA 100 MG tablet Take 50 mg by mouth as needed for erectile dysfunction.   12   No current facility-administered medications on file prior to visit.    There are no Patient Instructions on file for this visit. No follow-ups on file.  KIMBERLY A STEGMAYER, PA-C

## 2018-06-23 DIAGNOSIS — S46101A Unspecified injury of muscle, fascia and tendon of long head of biceps, right arm, initial encounter: Secondary | ICD-10-CM | POA: Insufficient documentation

## 2018-07-26 ENCOUNTER — Other Ambulatory Visit (INDEPENDENT_AMBULATORY_CARE_PROVIDER_SITE_OTHER): Payer: Self-pay | Admitting: Vascular Surgery

## 2018-11-17 ENCOUNTER — Ambulatory Visit (INDEPENDENT_AMBULATORY_CARE_PROVIDER_SITE_OTHER): Payer: Medicare Other | Admitting: Vascular Surgery

## 2018-11-17 ENCOUNTER — Encounter (INDEPENDENT_AMBULATORY_CARE_PROVIDER_SITE_OTHER): Payer: Self-pay | Admitting: Vascular Surgery

## 2018-11-17 ENCOUNTER — Ambulatory Visit (INDEPENDENT_AMBULATORY_CARE_PROVIDER_SITE_OTHER): Payer: Medicare Other

## 2018-11-17 VITALS — BP 177/97 | HR 81 | Resp 16 | Ht 72.0 in | Wt 185.2 lb

## 2018-11-17 DIAGNOSIS — I70211 Atherosclerosis of native arteries of extremities with intermittent claudication, right leg: Secondary | ICD-10-CM

## 2018-11-17 DIAGNOSIS — I714 Abdominal aortic aneurysm, without rupture, unspecified: Secondary | ICD-10-CM

## 2018-11-17 DIAGNOSIS — I10 Essential (primary) hypertension: Secondary | ICD-10-CM | POA: Diagnosis not present

## 2018-11-17 DIAGNOSIS — B977 Papillomavirus as the cause of diseases classified elsewhere: Secondary | ICD-10-CM | POA: Insufficient documentation

## 2018-11-17 DIAGNOSIS — I708 Atherosclerosis of other arteries: Secondary | ICD-10-CM

## 2018-11-17 DIAGNOSIS — M150 Primary generalized (osteo)arthritis: Secondary | ICD-10-CM

## 2018-11-17 DIAGNOSIS — M159 Polyosteoarthritis, unspecified: Secondary | ICD-10-CM

## 2018-11-17 DIAGNOSIS — F17210 Nicotine dependence, cigarettes, uncomplicated: Secondary | ICD-10-CM

## 2018-11-19 ENCOUNTER — Encounter (INDEPENDENT_AMBULATORY_CARE_PROVIDER_SITE_OTHER): Payer: Self-pay | Admitting: Vascular Surgery

## 2018-11-19 DIAGNOSIS — I714 Abdominal aortic aneurysm, without rupture, unspecified: Secondary | ICD-10-CM | POA: Insufficient documentation

## 2018-11-19 DIAGNOSIS — I723 Aneurysm of iliac artery: Secondary | ICD-10-CM | POA: Insufficient documentation

## 2018-11-19 DIAGNOSIS — I708 Atherosclerosis of other arteries: Secondary | ICD-10-CM | POA: Insufficient documentation

## 2018-11-19 NOTE — Progress Notes (Signed)
MRN : [id number]  Sergio Callahan is a 68 y.o. ([birth year]) male who presents with chief complaint of  Chief Complaint  Patient presents with  . Follow-up  .  History of Present Illness: The patient returns to the office for followup and review of the noninvasive studies. There have been no interval changes in lower extremity symptoms. No interval shortening of the patient's claudication distance or development of rest pain symptoms. No new ulcers or wounds have occurred since the last visit.  There have been no significant changes to the patient's overall health care.  The patient denies amaurosis fugax or recent TIA symptoms. There are no recent neurological changes noted. The patient denies history of DVT, PE or superficial thrombophlebitis. The patient denies recent episodes of angina or shortness of breath.   ABI Rt=1.14 and Lt=1.11  (previous ABI's Rt=1.20 and Lt=1.16) Duplex ultrasound of the aorta iliac arteries demonstrates widely patent stents in the common iliac arteries bilaterally.  There is mild velocity elevation noted in the celiac artery.  The aorta itself measures 2.6 cm in maximal diameter.  Current Meds  Medication Sig  . ALPRAZolam (XANAX) 0.5 MG tablet Take 0.5 mg by mouth at bedtime as needed for anxiety.  Marland Kitchen aspirin 81 MG EC tablet Take 1 tablet (81 mg total) by mouth daily. Swallow whole.  . augmented betamethasone dipropionate (DIPROLENE-AF) 0.05 % cream Apply 1 application topically 2 (two) times daily as needed. Apply to affected areas on leg. Avoid face and genitals  . azaTHIOprine (IMURAN) 50 MG tablet Take 50 mg by mouth 2 (two) times daily.   . clobetasol (TEMOVATE) 0.05 % external solution Apply 1 application topically daily as needed (itching). Apply to scalp  . clopidogrel (PLAVIX) 75 MG tablet TAKE 1 TABLET EVERY DAY  . cyanocobalamin (,VITAMIN B-12,) 1000 MCG/ML injection Inject 1,000 mcg into the muscle every 14 (fourteen) days.   . fluticasone  (FLONASE) 50 MCG/ACT nasal spray Place 1 spray into both nostrils daily as needed for allergies.   . hydroxychloroquine (PLAQUENIL) 200 MG tablet Take 200 mg by mouth 2 (two) times daily.  Marland Kitchen ibuprofen (ADVIL,MOTRIN) 200 MG tablet Take 400 mg by mouth every 6 (six) hours as needed for headache or moderate pain.  Marland Kitchen lisinopril-hydrochlorothiazide (PRINZIDE,ZESTORETIC) 20-25 MG per tablet Take 1 tablet by mouth daily.  . metoprolol succinate (TOPROL-XL) 100 MG 24 hr tablet Take 100 mg by mouth at bedtime. Take with or immediately following a meal.  . omeprazole (PRILOSEC) 20 MG capsule Take 20 mg by mouth daily.  . pravastatin (PRAVACHOL) 40 MG tablet Take 40 mg by mouth at bedtime.  . sildenafil (REVATIO) 20 MG tablet Take 20 mg by mouth daily as needed for erectile dysfunction.   . tamsulosin (FLOMAX) 0.4 MG CAPS capsule Take 0.4 mg by mouth daily after breakfast.    Past Medical History:  Diagnosis Date  . Anemia    takes B12 injections monthly  . Anxiety    takes Xanax daily as needed  . Arthritis    back   . Back pain    synovial cyst  . Collagen vascular disease (HCC)    Hx Lupus.  . Coronary artery disease   . Enlarged prostate    takes Flomax daily-slightly  . GERD (gastroesophageal reflux disease)    takes Omeprazole daily  . History of colon polyps    benign  . History of shingles   . Hyperlipidemia    takes Pravastatin nightly  . Hypertension  takes Lisinopril-HCTZ and Metoprolol daily  . Lupus ([place])    takes Plaquenil and Imran daily  . Nocturia   . Numbness and tingling    left hip/leg/foot  . Peripheral vascular disease ([place])   . Urinary frequency     Past Surgical History:  Procedure Laterality Date  . CARDIAC CATHETERIZATION  2012  . COLONOSCOPY    . CORONARY ANGIOPLASTY     1 stent  . ESOPHAGOGASTRODUODENOSCOPY    . LUMBAR LAMINECTOMY/DECOMPRESSION MICRODISCECTOMY Left 02/03/2015   Procedure: LUMBAR LAMINECTOMY/DECOMPRESSION MICRODISCECTOMY LEFT  LUMBAR FOUR-FIVE WITH REMOVAL OF SYNOVIAL CYST;  Surgeon: Newman Pies, MD;  Location: [facility];  Service: Neurosurgery;  Laterality: Left;  . PELVIC ANGIOGRAPHY N/A 05/02/2018   Procedure: PELVIC ANGIOGRAPHY;  Surgeon: Katha Cabal, MD;  Location: [facility];  Service: Cardiovascular;  Laterality: N/A;    Social History Social History   Tobacco Use  . Smoking status: Current Every Day Smoker    Packs/day: 1.50    Years: 30.00    Pack years: 45.00  . Smokeless tobacco: Never Used  Substance Use Topics  . Alcohol use: Yes    Comment: cocktails about 2 daily  . Drug use: No    Family History Family History  Problem Relation Age of Onset  . Hyperlipidemia Father   . Hypertension Father   . Diabetes Father   . Colon cancer Father   . Hearing loss Father   . Vascular Disease Father     Allergies  Allergen Reactions  . Fosamax [Alendronate Sodium]     Leg pain     REVIEW OF SYSTEMS (Negative unless checked)  Constitutional: [] Weight loss  [] Fever  [] Chills Cardiac: [] Chest pain   [] Chest pressure   [] Palpitations   [] Shortness of breath when laying flat   [] Shortness of breath with exertion. Vascular:  [] Pain in legs with walking   [] Pain in legs at rest  [] History of DVT   [] Phlebitis   [] Swelling in legs   [] Varicose veins   [] Non-healing ulcers Pulmonary:   [] Uses home oxygen   [] Productive cough   [] Hemoptysis   [] Wheeze  [] COPD   [] Asthma Neurologic:  [] Dizziness   [] Seizures   [] History of stroke   [] History of TIA  [] Aphasia   [] Vissual changes   [] Weakness or numbness in arm   [] Weakness or numbness in leg Musculoskeletal:   [] Joint swelling   [x] Joint pain   [] Low back pain Hematologic:  [] Easy bruising  [] Easy bleeding   [] Hypercoagulable state   [] Anemic Gastrointestinal:  [] Diarrhea   [] Vomiting  [] Gastroesophageal reflux/heartburn   [] Difficulty swallowing. Genitourinary:  [] Chronic kidney disease   [] Difficult urination  [] Frequent  urination   [] Blood in urine Skin:  [] Rashes   [] Ulcers  Psychological:  [] History of anxiety   []  History of major depression.  Physical Examination  Vitals:   11/17/18 0927  BP: (!) 177/97  Pulse: 81  Resp: 16  Weight: 185 lb 3.2 oz (84 kg)  Height: 6' (1.829 m)   Body mass index is 25.12 kg/m. Gen: WD/WN, NAD Head: [place]/AT, No temporalis wasting.  Ear/Nose/Throat: Hearing grossly intact, nares w/o erythema or drainage Eyes: PER, EOMI, sclera nonicteric.  Neck: Supple, no large masses.   Pulmonary:  Good air movement, no audible wheezing bilaterally, no use of accessory muscles.  Cardiac: RRR, no JVD Vascular: Feet are pink and warm with brisk capillary refill.  No abdominal bruits. Vessel Right Left  Radial Palpable Palpable  Popliteal Palpable Palpable  PT Palpable Palpable  DP Palpable Palpable  Gastrointestinal: Non-distended. No guarding/no peritoneal signs.  Musculoskeletal: M/S 5/5 throughout.  No deformity or atrophy.  Neurologic: CN 2-12 intact. Symmetrical.  Speech is fluent. Motor exam as listed above. Psychiatric: Judgment intact, Mood & affect appropriate for pt's clinical situation. Dermatologic: No rashes or ulcers noted.  No changes consistent with cellulitis. Lymph : No lichenification or skin changes of chronic lymphedema.  CBC Lab Results  Component Value Date   WBC 4.1 01/25/2015   HGB 15.9 01/25/2015   HCT 43.5 01/25/2015   MCV 95.6 01/25/2015   PLT 239 01/25/2015    BMET    Component Value Date/Time   NA 127 (L) 01/25/2015 1036   K 4.0 01/25/2015 1036   CL 91 (L) 01/25/2015 1036   CO2 28 01/25/2015 1036   GLUCOSE 98 01/25/2015 1036   BUN 11 05/02/2018 1209   CREATININE 0.72 05/02/2018 1209   CALCIUM 9.2 01/25/2015 1036   GFRNONAA >60 05/02/2018 1209   GFRAA >60 05/02/2018 1209   CrCl cannot be calculated (Patient's most recent lab result is older than the maximum 21 days allowed.).  COAG No results found for: INR,  PROTIME  Radiology No results found.   Assessment/Plan 1. Atherosclerosis of native artery of right lower extremity with intermittent claudication (HCC)  Recommend:  The patient has evidence of atherosclerosis of the lower extremities with claudication.  The patient does not voice lifestyle limiting changes at this point in time.  Noninvasive studies do not suggest clinically significant change.  No invasive studies, angiography or surgery at this time The patient should continue walking and begin a more formal exercise program.  The patient should continue antiplatelet therapy and aggressive treatment of the lipid abnormalities  No changes in the patient's medications at this time  The patient should continue wearing graduated compression socks 10-15 mmHg strength to control the mild edema.   - VAS [place] ABI WITH/WO TBI; Future - VAS US AORTA/IVC/ILIACS; Future  2. AAA (abdominal aortic aneurysm) without rupture (HCC) No surgery or intervention at this time. The patient has an asymptomatic abdominal aortic ectasia that is less than 3 cm in maximal diameter.  I have discussed the natural history of abdominal aortic aneurysm and the small risk of rupture for aneurysm less than 5 cm in size.  However, as these small aneurysms tend to enlarge over time, continued surveillance with ultrasound or CT scan is mandatory.  I have also discussed optimizing medical management with hypertension and lipid control and the importance of abstinence from tobacco.  The patient is also encouraged to exercise a minimum of 30 minutes 4 times a week.  Should the patient develop new onset abdominal or back pain or signs of peripheral embolization they are instructed to seek medical attention immediately and to alert the physician providing care that they have an aneurysm.  The patient voices their understanding. The patient will return in 12 months with an aortic duplex.  - VAS US AORTA/IVC/ILIACS;  Future  3. Celiac artery atherosclerosis Patient is completely asymptomatic and this was an incidental finding.  It can be followed with his annual aortic duplex.  No intervention is indicated at this time.  4. Essential hypertension Continue antihypertensive medications as already ordered, these medications have been reviewed and there are no changes at this time.   5. Primary osteoarthritis involving multiple joints Continue NSAID medications as already ordered, these medications have been reviewed and there are no changes at this time.  Continued activity and therapy was stressed.     Hortencia Pilar, MD  11/19/2018 6:16 PM

## 2018-12-02 DIAGNOSIS — Z86010 Personal history of colonic polyps: Secondary | ICD-10-CM | POA: Insufficient documentation

## 2018-12-03 ENCOUNTER — Other Ambulatory Visit: Payer: Self-pay

## 2018-12-03 ENCOUNTER — Encounter
Admission: RE | Admit: 2018-12-03 | Discharge: 2018-12-03 | Disposition: A | Discharge status: Home / Self Care | Payer: Medicare Other | Source: Ambulatory Visit | Attending: Surgery | Admitting: Surgery

## 2018-12-03 DIAGNOSIS — I252 Old myocardial infarction: Secondary | ICD-10-CM | POA: Diagnosis not present

## 2018-12-03 DIAGNOSIS — Z01812 Encounter for preprocedural laboratory examination: Secondary | ICD-10-CM

## 2018-12-03 DIAGNOSIS — E785 Hyperlipidemia, unspecified: Secondary | ICD-10-CM | POA: Diagnosis not present

## 2018-12-03 DIAGNOSIS — I739 Peripheral vascular disease, unspecified: Secondary | ICD-10-CM | POA: Diagnosis not present

## 2018-12-03 DIAGNOSIS — Z87891 Personal history of nicotine dependence: Secondary | ICD-10-CM | POA: Diagnosis not present

## 2018-12-03 DIAGNOSIS — D649 Anemia, unspecified: Secondary | ICD-10-CM | POA: Diagnosis not present

## 2018-12-03 DIAGNOSIS — M75111 Incomplete rotator cuff tear or rupture of right shoulder, not specified as traumatic: Secondary | ICD-10-CM | POA: Diagnosis present

## 2018-12-03 DIAGNOSIS — M7521 Bicipital tendinitis, right shoulder: Secondary | ICD-10-CM | POA: Diagnosis not present

## 2018-12-03 DIAGNOSIS — Z79899 Other long term (current) drug therapy: Secondary | ICD-10-CM | POA: Diagnosis not present

## 2018-12-03 DIAGNOSIS — F419 Anxiety disorder, unspecified: Secondary | ICD-10-CM | POA: Diagnosis not present

## 2018-12-03 DIAGNOSIS — I10 Essential (primary) hypertension: Secondary | ICD-10-CM | POA: Diagnosis not present

## 2018-12-03 DIAGNOSIS — I2510 Atherosclerotic heart disease of native coronary artery without angina pectoris: Secondary | ICD-10-CM | POA: Diagnosis not present

## 2018-12-03 DIAGNOSIS — K219 Gastro-esophageal reflux disease without esophagitis: Secondary | ICD-10-CM | POA: Diagnosis not present

## 2018-12-03 LAB — CBC
HCT: 45 % (ref 39.0–52.0)
Hemoglobin: 15.7 g/dL (ref 13.0–17.0)
MCH: 33.8 pg (ref 26.0–34.0)
MCHC: 34.9 g/dL (ref 30.0–36.0)
MCV: 97 fL (ref 80.0–100.0)
Platelets: 264 10*3/uL (ref 150–400)
RBC: 4.64 MIL/uL (ref 4.22–5.81)
RDW: 12.6 % (ref 11.5–15.5)
WBC: 5 10*3/uL (ref 4.0–10.5)
nRBC: 0 % (ref 0.0–0.2)

## 2018-12-03 MED ORDER — CEFAZOLIN SODIUM-DEXTROSE 2-4 GM/100ML-% IV SOLN: 2.0000 g | Freq: Once | INTRAVENOUS | Status: DC

## 2018-12-03 NOTE — Patient Instructions (Signed)
Your procedure is scheduled on: 12/04/2018 Report to [facility]. To find out your arrival time please call [phone] between 1PM - 3PM on TODAY.  Remember: Instructions that are not followed completely may result in serious medical risk, up to and including death, or upon the discretion of your surgeon and anesthesiologist your surgery may need to be rescheduled.     _X__ 1. Do not eat food after midnight the night before your procedure.                 No gum chewing or hard candies. You may drink clear liquids up to 2 hours                 before you are scheduled to arrive for your surgery- DO not drink clear                 liquids within 2 hours of the start of your surgery.                 Clear Liquids include:  water, apple juice without pulp, clear carbohydrate                 drink such as Clearfast or Gatorade, Black Coffee or Tea (Do not add                 anything to coffee or tea).  __X__2.  On the morning of surgery brush your teeth with toothpaste and water, you                 may rinse your mouth with mouthwash if you wish.  Do not swallow any              toothpaste of mouthwash.     _X__ 3.  No Alcohol for 24 hours before or after surgery.   _X__ 4.  Do Not Smoke or use e-cigarettes For 24 Hours Prior to Your Surgery.                 Do not use any chewable tobacco products for at least 6 hours prior to                 surgery.  ____  5.  Bring all medications with you on the day of surgery if instructed.   __X__  6.  Notify your doctor if there is any change in your medical condition      (cold, fever, infections).     Do not wear jewelry, make-up, hairpins, clips or nail polish. Do not wear lotions, powders, or perfumes.  Do not shave 48 hours prior to surgery. Men may shave face and neck. Do not bring valuables to the hospital.    [facility] is not responsible for any belongings or  valuables.  Contacts, dentures/partials or body piercings may not be worn into surgery. Bring a case for your contacts, glasses or hearing aids, a denture cup will be supplied. Leave your suitcase in the car. After surgery it may be brought to your room. For patients admitted to the hospital, discharge time is determined by your treatment team.   Patients discharged the day of surgery will not be allowed to drive home.   Please read over the following fact sheets that you were given:   MRSA Information  __X__ Take these medicines the morning of surgery with A SIP OF WATER:    1. fexofenadine (  ALLEGRA) fexofenadine (ALLEGRA)   2. omeprazole (PRILOSEC)  3. tamsulosin (FLOMAX)  4.  5.  6.  ____ Fleet Enema (as directed)   __X__ Use CHG Soap/SAGE wipes as directed  ____ Use inhalers on the day of surgery  ____ Stop metformin/Janumet/Farxiga 2 days prior to surgery    ____ Take 1/2 of usual insulin dose the night before surgery. No insulin the morning          of surgery.   __X__ Stop Blood Thinners Coumadin/Plavix/Xarelto/Pleta/Pradaxa/Eliquis/Effient/Aspirin  on AS INSTRUCTED  Or contact your Surgeon, Cardiologist or Medical Doctor regarding  ability to stop your blood thinners  __X__ Stop Anti-inflammatories 7 days before surgery such as Advil, Ibuprofen, Motrin,  BC or Goodies Powder, Naprosyn, Naproxen, Aleve, Aspirin    __X__ Stop all herbal supplements, fish oil or vitamin E until after surgery.    ____ Bring C-Pap to the hospital.

## 2018-12-04 ENCOUNTER — Ambulatory Visit: Payer: Medicare Other | Admitting: Anesthesiology

## 2018-12-04 ENCOUNTER — Encounter: Payer: Self-pay | Admitting: Emergency Medicine

## 2018-12-04 ENCOUNTER — Encounter
Admission: RE | Disposition: A | Discharge status: Home / Self Care | Payer: Self-pay | Source: Home / Self Care | Attending: Surgery

## 2018-12-04 ENCOUNTER — Ambulatory Visit
Admission: RE | Admit: 2018-12-04 | Discharge: 2018-12-04 | Disposition: A | Discharge status: Home / Self Care | Payer: Medicare Other | Attending: Surgery | Admitting: Surgery

## 2018-12-04 ENCOUNTER — Other Ambulatory Visit: Payer: Self-pay

## 2018-12-04 DIAGNOSIS — Z87891 Personal history of nicotine dependence: Secondary | ICD-10-CM | POA: Insufficient documentation

## 2018-12-04 DIAGNOSIS — M7521 Bicipital tendinitis, right shoulder: Secondary | ICD-10-CM | POA: Insufficient documentation

## 2018-12-04 DIAGNOSIS — I739 Peripheral vascular disease, unspecified: Secondary | ICD-10-CM | POA: Insufficient documentation

## 2018-12-04 DIAGNOSIS — D649 Anemia, unspecified: Secondary | ICD-10-CM | POA: Insufficient documentation

## 2018-12-04 DIAGNOSIS — I10 Essential (primary) hypertension: Secondary | ICD-10-CM | POA: Insufficient documentation

## 2018-12-04 DIAGNOSIS — I2510 Atherosclerotic heart disease of native coronary artery without angina pectoris: Secondary | ICD-10-CM | POA: Insufficient documentation

## 2018-12-04 DIAGNOSIS — Z79899 Other long term (current) drug therapy: Secondary | ICD-10-CM | POA: Insufficient documentation

## 2018-12-04 DIAGNOSIS — M75111 Incomplete rotator cuff tear or rupture of right shoulder, not specified as traumatic: Secondary | ICD-10-CM | POA: Insufficient documentation

## 2018-12-04 DIAGNOSIS — I252 Old myocardial infarction: Secondary | ICD-10-CM | POA: Insufficient documentation

## 2018-12-04 DIAGNOSIS — E785 Hyperlipidemia, unspecified: Secondary | ICD-10-CM | POA: Insufficient documentation

## 2018-12-04 DIAGNOSIS — F419 Anxiety disorder, unspecified: Secondary | ICD-10-CM | POA: Insufficient documentation

## 2018-12-04 DIAGNOSIS — K219 Gastro-esophageal reflux disease without esophagitis: Secondary | ICD-10-CM | POA: Insufficient documentation

## 2018-12-04 HISTORY — PX: SHOULDER ARTHROSCOPY WITH SUBACROMIAL DECOMPRESSION AND BICEP TENDON REPAIR: SHX5689

## 2018-12-04 SURGERY — SHOULDER ARTHROSCOPY WITH SUBACROMIAL DECOMPRESSION AND BICEP TENDON REPAIR
Anesthesia: General | Site: Shoulder | Laterality: Right

## 2018-12-04 MED ORDER — DEXMEDETOMIDINE HCL 200 MCG/2ML IV SOLN
INTRAVENOUS | Status: DC | PRN
  Administered 2018-12-04: 20 ug via INTRAVENOUS

## 2018-12-04 MED ORDER — LIDOCAINE HCL (PF) 1 % IJ SOLN
INTRAMUSCULAR | Status: AC
  Filled 2018-12-04: qty 5

## 2018-12-04 MED ORDER — FENTANYL CITRATE (PF) 100 MCG/2ML IJ SOLN
INTRAMUSCULAR | Status: DC | PRN
  Administered 2018-12-04: 100 ug via INTRAVENOUS

## 2018-12-04 MED ORDER — HYDROCODONE-ACETAMINOPHEN 5-325 MG PO TABS: 1.0000 | ORAL_TABLET | Freq: Four times a day (QID) | ORAL | 0 refills | Status: DC | PRN

## 2018-12-04 MED ORDER — SUGAMMADEX SODIUM 200 MG/2ML IV SOLN
INTRAVENOUS | Status: AC
  Filled 2018-12-04: qty 2

## 2018-12-04 MED ORDER — BUPIVACAINE LIPOSOME 1.3 % IJ SUSP
INTRAMUSCULAR | Status: DC | PRN
  Administered 2018-12-04 (×2): 10 mL

## 2018-12-04 MED ORDER — SUGAMMADEX SODIUM 200 MG/2ML IV SOLN
INTRAVENOUS | Status: DC | PRN
  Administered 2018-12-04: 170 mg via INTRAVENOUS

## 2018-12-04 MED ORDER — FENTANYL CITRATE (PF) 100 MCG/2ML IJ SOLN
INTRAMUSCULAR | Status: AC
  Administered 2018-12-04: 50 ug via INTRAVENOUS
  Filled 2018-12-04: qty 2

## 2018-12-04 MED ORDER — ONDANSETRON HCL 4 MG/2ML IJ SOLN
INTRAMUSCULAR | Status: DC | PRN
  Administered 2018-12-04: 4 mg via INTRAVENOUS

## 2018-12-04 MED ORDER — FENTANYL CITRATE (PF) 100 MCG/2ML IJ SOLN
50.0000 ug | Freq: Once | INTRAMUSCULAR | Status: AC
  Administered 2018-12-04: 50 ug via INTRAVENOUS

## 2018-12-04 MED ORDER — LACTATED RINGERS IV SOLN
INTRAVENOUS | Status: DC
  Administered 2018-12-04: 13:00:00 via INTRAVENOUS

## 2018-12-04 MED ORDER — PROPOFOL 10 MG/ML IV BOLUS
INTRAVENOUS | Status: AC
  Filled 2018-12-04: qty 20

## 2018-12-04 MED ORDER — MIDAZOLAM HCL 2 MG/2ML IJ SOLN
INTRAMUSCULAR | Status: AC
  Administered 2018-12-04: 1 mg via INTRAVENOUS
  Filled 2018-12-04: qty 2

## 2018-12-04 MED ORDER — ROCURONIUM BROMIDE 100 MG/10ML IV SOLN
INTRAVENOUS | Status: DC | PRN
  Administered 2018-12-04: 10 mg via INTRAVENOUS
  Administered 2018-12-04: 40 mg via INTRAVENOUS

## 2018-12-04 MED ORDER — BUPIVACAINE-EPINEPHRINE 0.5% -1:200000 IJ SOLN
INTRAMUSCULAR | Status: DC | PRN
  Administered 2018-12-04: 30 mL

## 2018-12-04 MED ORDER — BUPIVACAINE-EPINEPHRINE (PF) 0.5% -1:200000 IJ SOLN
INTRAMUSCULAR | Status: AC
  Filled 2018-12-04: qty 30

## 2018-12-04 MED ORDER — HYDROMORPHONE HCL 1 MG/ML IJ SOLN: 0.2500 mg | INTRAMUSCULAR | Status: DC | PRN

## 2018-12-04 MED ORDER — BUPIVACAINE HCL (PF) 0.5 % IJ SOLN
INTRAMUSCULAR | Status: AC
  Filled 2018-12-04: qty 10

## 2018-12-04 MED ORDER — EPINEPHRINE PF 1 MG/ML IJ SOLN
INTRAMUSCULAR | Status: AC
  Filled 2018-12-04: qty 2

## 2018-12-04 MED ORDER — MIDAZOLAM HCL 2 MG/2ML IJ SOLN
INTRAMUSCULAR | Status: AC
  Filled 2018-12-04: qty 2

## 2018-12-04 MED ORDER — DEXAMETHASONE SODIUM PHOSPHATE 10 MG/ML IJ SOLN
INTRAMUSCULAR | Status: DC | PRN
  Administered 2018-12-04: 8 mg via INTRAVENOUS

## 2018-12-04 MED ORDER — MIDAZOLAM HCL 2 MG/2ML IJ SOLN
1.0000 mg | Freq: Once | INTRAMUSCULAR | Status: AC
  Administered 2018-12-04: 1 mg via INTRAVENOUS

## 2018-12-04 MED ORDER — LACTATED RINGERS IV SOLN
INTRAVENOUS | Status: DC | PRN
  Administered 2018-12-04: 2 mL

## 2018-12-04 MED ORDER — PHENYLEPHRINE HCL 10 MG/ML IJ SOLN
INTRAMUSCULAR | Status: DC | PRN
  Administered 2018-12-04 (×4): 100 ug via INTRAVENOUS

## 2018-12-04 MED ORDER — BUPIVACAINE LIPOSOME 1.3 % IJ SUSP
INTRAMUSCULAR | Status: AC
  Filled 2018-12-04: qty 20

## 2018-12-04 MED ORDER — LIDOCAINE HCL (CARDIAC) PF 100 MG/5ML IV SOSY
PREFILLED_SYRINGE | INTRAVENOUS | Status: DC | PRN
  Administered 2018-12-04: 60 mg via INTRAVENOUS
  Administered 2018-12-04: 40 mg via INTRAVENOUS

## 2018-12-04 MED ORDER — PROPOFOL 10 MG/ML IV BOLUS
INTRAVENOUS | Status: DC | PRN
  Administered 2018-12-04: 150 mg via INTRAVENOUS
  Administered 2018-12-04: 50 mg via INTRAVENOUS

## 2018-12-04 MED ORDER — SODIUM CHLORIDE 0.9 % IV SOLN
INTRAVENOUS | Status: DC | PRN
  Administered 2018-12-04: 25 ug/min via INTRAVENOUS

## 2018-12-04 MED ORDER — BUPIVACAINE HCL (PF) 0.5 % IJ SOLN
INTRAMUSCULAR | Status: DC | PRN
  Administered 2018-12-04 (×2): 5 mL

## 2018-12-04 MED ORDER — FENTANYL CITRATE (PF) 100 MCG/2ML IJ SOLN
INTRAMUSCULAR | Status: AC
  Filled 2018-12-04: qty 2

## 2018-12-04 MED ORDER — MIDAZOLAM HCL 2 MG/2ML IJ SOLN
INTRAMUSCULAR | Status: DC | PRN
  Administered 2018-12-04: 2 mg via INTRAVENOUS

## 2018-12-04 SURGICAL SUPPLY — 47 items
ANCHOR BONE REGENETEN (Anchor) ×2 IMPLANT
ANCHOR JUGGERKNOT WTAP NDL 2.9 (Anchor) ×2 IMPLANT
ANCHOR TENDON REGENETEN (Staple) ×2 IMPLANT
BIT DRILL JUGRKNT W/NDL BIT2.9 (DRILL) IMPLANT
BLADE FULL RADIUS 3.5 (BLADE) ×3 IMPLANT
BUR ACROMIONIZER 4.0 (BURR) ×3 IMPLANT
CANNULA SHAVER 8MMX76MM (CANNULA) ×3 IMPLANT
CHLORAPREP W/TINT 26ML (MISCELLANEOUS) ×3 IMPLANT
COVER MAYO STAND STRL (DRAPES) ×3 IMPLANT
COVER WAND RF STERILE (DRAPES) ×3 IMPLANT
DRAPE IMP U-DRAPE 54X76 (DRAPES) ×6 IMPLANT
DRILL JUGGERKNOT W/NDL BIT 2.9 (DRILL) ×3
ELECT REM PT RETURN 9FT ADLT (ELECTROSURGICAL) ×3
ELECTRODE REM PT RTRN 9FT ADLT (ELECTROSURGICAL) ×1 IMPLANT
GAUZE PETRO XEROFOAM 1X8 (MISCELLANEOUS) ×3 IMPLANT
GAUZE SPONGE 4X4 12PLY STRL (GAUZE/BANDAGES/DRESSINGS) ×3 IMPLANT
GLOVE BIO SURGEON STRL SZ7.5 (GLOVE) ×6 IMPLANT
GLOVE BIO SURGEON STRL SZ8 (GLOVE) ×6 IMPLANT
GLOVE BIOGEL PI IND STRL 8 (GLOVE) ×1 IMPLANT
GLOVE BIOGEL PI INDICATOR 8 (GLOVE) ×4
GLOVE INDICATOR 8.0 STRL GRN (GLOVE) ×3 IMPLANT
GOWN STRL REUS W/ TWL LRG LVL3 (GOWN DISPOSABLE) ×1 IMPLANT
GOWN STRL REUS W/ TWL XL LVL3 (GOWN DISPOSABLE) ×1 IMPLANT
GOWN STRL REUS W/TWL LRG LVL3 (GOWN DISPOSABLE) ×2
GOWN STRL REUS W/TWL XL LVL3 (GOWN DISPOSABLE) ×2
GRASPER SUT 15 45D LOW PRO (SUTURE) IMPLANT
IMPL REGENETEN MEDIUM (Shoulder) IMPLANT
IMPLANT REGENETEN MEDIUM (Shoulder) ×3 IMPLANT
IV LACTATED RINGER IRRG 3000ML (IV SOLUTION) ×4
IV LR IRRIG 3000ML ARTHROMATIC (IV SOLUTION) ×2 IMPLANT
MANIFOLD NEPTUNE II (INSTRUMENTS) ×3 IMPLANT
MASK FACE SPIDER DISP (MASK) ×3 IMPLANT
MAT ABSORB  FLUID 56X50 GRAY (MISCELLANEOUS) ×2
MAT ABSORB FLUID 56X50 GRAY (MISCELLANEOUS) ×1 IMPLANT
PACK ARTHROSCOPY SHOULDER (MISCELLANEOUS) ×3 IMPLANT
SLING ARM LRG DEEP (SOFTGOODS) ×3 IMPLANT
SLING ULTRA II LG (MISCELLANEOUS) ×1 IMPLANT
STAPLER SKIN PROX 35W (STAPLE) ×3 IMPLANT
STRAP SAFETY 5IN WIDE (MISCELLANEOUS) ×3 IMPLANT
SUT ETHIBOND 0 MO6 C/R (SUTURE) ×3 IMPLANT
SUT VIC AB 2-0 CT1 27 (SUTURE) ×4
SUT VIC AB 2-0 CT1 TAPERPNT 27 (SUTURE) ×2 IMPLANT
TAPE MICROFOAM 4IN (TAPE) ×3 IMPLANT
TUBING ARTHRO INFLOW-ONLY STRL (TUBING) ×3 IMPLANT
TUBING CONNECTING 10 (TUBING) ×2 IMPLANT
TUBING CONNECTING 10' (TUBING) ×1
WAND WEREWOLF FLOW 90D (MISCELLANEOUS) ×3 IMPLANT

## 2018-12-04 NOTE — Op Note (Signed)
12/04/2018  5:17 PM  Patient:   Sergio Callahan  Pre-Op Diagnosis:   Impingement/tendinopathy with biceps tendinopathy, right shoulder.  Post-Op Diagnosis:   Impingement/tendinopathy with bursal and articular surface partial-thickness rotator cuff tear, labral fraying, and biceps tendinopathy, right shoulder.  Procedure:   Limited arthroscopic debridement, arthroscopic subacromial decompression, mini-open rotator cuff repair using a [employer]hew Regeneten patch, and mini-open biceps tenodesis, right shoulder.  Anesthesia:   General endotracheal with interscalene block placed preoperatively by the anesthesiologist.  Surgeon:   Pascal Lux, MD  Assistant:   None  Findings:   As above. There was a bursal surface partial thickness tear involving the mid-insertional fibers of the supraspinatus tendon as well as an articular surface partial-thickness tear of the supraspinatus tendon involving less than 20% of the thickness of the tendon. There also was some fraying of the superior articular surface fibers of the subscapularis tendon involving less than 15% of the tendon. The articular surfaces of the glenoid and humerus both were in satisfactory condition. There was moderate fraying of the anterior and superior portions of the labrum without frank detachment from the glenoid. There were significant tendinopathic changes with partial-thickness tearing of the long head of the biceps tendon.  Complications:   None  Fluids:   700 cc  Estimated blood loss:   10 cc  Tourniquet time:   None  Drains:   None  Closure:   Staples      Brief clinical note:   The patient is a 68 year old male with a long history of progressively worsening right shoulder pain. The patient's symptoms have progressed despite medications, activity modification, etc. The patient's history and examination are consistent with impingement/tendinopathy with a possible rotator cuff tear. These findings were confirmed by MRI  scan, which also demonstrated significant tendinopathy of the biceps tendon. The patient presents at this time for definitive management of these shoulder symptoms.  Procedure:   The patient underwent placement of an interscalene block by the anesthesiologist in the preoperative holding area before being brought into the operating room and lain in the supine position. The patient then underwent general endotracheal intubation and anesthesia before being repositioned in the beach chair position using the beach chair positioner. The right shoulder and upper extremity were prepped with ChloraPrep solution before being draped sterilely. Preoperative antibiotics were administered. A timeout was performed to confirm the proper surgical site before the expected portal sites and incision site were injected with 0.5% Sensorcaine with epinephrine. A posterior portal was created and the glenohumeral joint thoroughly inspected with the findings as described above. An anterior portal was created using an outside-in technique. The labrum and rotator cuff were further probed, again confirming the above-noted findings. The areas of labral fraying and synovitis were debrided back to stable margins using the full-radius resector. The frayed portions of the rotator cuff also were debrided back to stable margins using the full-radius resector. The ArthroCare wand was inserted and used to release the biceps tendon from its labral anchor. It also was used to obtain hemostasis as well as to "anneal" the labrum superiorly and anteriorly. The instruments were removed from the joint after suctioning the excess fluid.  The camera was repositioned through the posterior portal into the subacromial space. A separate lateral portal was created using an outside-in technique. The 3.5 mm full-radius resector was introduced and used to perform a subtotal bursectomy. The ArthroCare wand was then inserted and used to remove the periosteal tissue  off the undersurface  of the anterior third of the acromion as well as to recess the coracoacromial ligament from its attachment along the anterior and lateral margins of the acromion. The 4.0 mm acromionizing bur was introduced and used to complete the decompression by removing the undersurface of the anterior third of the acromion. The full radius resector was reintroduced to remove any residual bony debris before the ArthroCare wand was reintroduced to obtain hemostasis. The instruments were then removed from the subacromial space after suctioning the excess fluid.  An approximately 4-5 cm incision was made over the anterolateral aspect of the shoulder beginning at the anterolateral corner of the acromion and extending distally in line with the bicipital groove. This incision was carried down through the subcutaneous tissues to expose the deltoid fascia. The raphae between the anterior and middle thirds was identified and this plane developed to provide access into the subacromial space. Additional bursal tissues were debrided sharply using Metzenbaum scissors. The rotator cuff tear was carefully inspected. By palpation, there was an area of thinning of the mid-insertional fibers of the supraspinatus tendon with bursal scuffing. As this corresponded to the area of the articular surface partial-thickness tearing noted arthroscopically, it was felt best to reinforce this area. This was accomplished by applying a medium sized [place] patch and secured using the appropriate soft tissue and bone staples.  The bicipital groove was identified by palpation and opened for 1-1.5 cm. The biceps tendon stump was retrieved through this defect. The floor of the bicipital groove was roughened with a curet before a a single Biomet 2.9 mm JuggerKnot anchor was inserted. Both sets of sutures were passed through the biceps tendon and tied securely to effect the tenodesis. The bicipital sheath was reapproximated  using two #0 Ethibond interrupted sutures, incorporating the biceps tendon to further reinforce the tenodesis.  The wound was copiously irrigated with sterile saline solution before the deltoid raphae was reapproximated using 2-0 Vicryl interrupted sutures. The subcutaneous tissues were closed in two layers using 2-0 Vicryl interrupted sutures before the skin was closed using staples. The portal sites also were closed using staples. A sterile bulky dressing was applied to the shoulder before the arm was placed into a shoulder immobilizer. The patient was then awakened, extubated, and returned to the recovery room in satisfactory condition after tolerating the procedure well.

## 2018-12-04 NOTE — Anesthesia Postprocedure Evaluation (Signed)
Anesthesia Post Note  Patient: Sergio Callahan  Procedure(s) Performed: SHOULDER ARTHROSCOPY WITH DEBRIDEMENT, DECOMPRESSION, BICEP TENODESIS (Right Shoulder)  Patient location during evaluation: PACU Anesthesia Type: General Level of consciousness: awake and alert Pain management: pain level controlled Vital Signs Assessment: post-procedure vital signs reviewed and stable Respiratory status: spontaneous breathing, nonlabored ventilation, respiratory function stable and patient connected to nasal cannula oxygen Cardiovascular status: blood pressure returned to baseline and stable Postop Assessment: no apparent nausea or vomiting Anesthetic complications: no     Last Vitals:  Vitals:   12/04/18 1745 12/04/18 1757  BP: 135/89   Pulse: 90 81  Resp: 13 18  Temp:  (!) 36.1 C  SpO2: 90% 95%    Last Pain:  Vitals:   12/04/18 1757  TempSrc:   PainSc: 0-No pain                 Molli Barrows

## 2018-12-04 NOTE — Transfer of Care (Signed)
Immediate Anesthesia Transfer of Care Note  Patient: Sergio Callahan  Procedure(s) Performed: SHOULDER ARTHROSCOPY WITH DEBRIDEMENT, DECOMPRESSION, BICEP TENODESIS (Right Shoulder)  Patient Location: PACU  Anesthesia Type:General  Level of Consciousness: sedated  Airway & Oxygen Therapy: Patient connected to face mask oxygen  Post-op Assessment: Post -op Vital signs reviewed and stable  Post vital signs: stable  Last Vitals:  Vitals Value Taken Time  BP 118/77 12/04/2018  5:12 PM  Temp 36.1 C 12/04/2018  5:12 PM  Pulse 86 12/04/2018  5:13 PM  Resp 14 12/04/2018  5:13 PM  SpO2 95 % 12/04/2018  5:13 PM  Vitals shown include unvalidated device data.  Last Pain:  Vitals:   12/04/18 1712  TempSrc: Temporal  PainSc:          Complications: No apparent anesthesia complications

## 2018-12-04 NOTE — Anesthesia Preprocedure Evaluation (Addendum)
Anesthesia Evaluation  Patient identified by MRN, date of birth, ID band Patient awake    Reviewed: Allergy & Precautions, H&P , NPO status , Patient's Chart, lab work & pertinent test results  Airway Mallampati: II       Dental  (+) Teeth Intact   Pulmonary neg shortness of breath, neg COPD, neg recent URI, former smoker,           Cardiovascular hypertension, (-) angina+ CAD, + Cardiac Stents (2012) and + Peripheral Vascular Disease  (-) Past MI, (-) CABG and (-) DOE      Neuro/Psych PSYCHIATRIC DISORDERS Anxiety negative neurological ROS     GI/Hepatic Neg liver ROS, GERD  ,  Endo/Other  negative endocrine ROS  Renal/GU negative Renal ROS  negative genitourinary   Musculoskeletal   Abdominal   Peds  Hematology  (+) Blood dyscrasia, anemia ,   Anesthesia Other Findings Past Medical History: No date: Anemia     Comment:  takes B12 injections monthly No date: Anxiety     Comment:  takes Xanax daily as needed No date: Arthritis     Comment:  back  No date: Back pain     Comment:  synovial cyst No date: Collagen vascular disease (HCC)     Comment:  Hx Lupus. No date: Coronary artery disease No date: Enlarged prostate     Comment:  takes Flomax daily-slightly No date: GERD (gastroesophageal reflux disease)     Comment:  takes Omeprazole daily No date: History of colon polyps     Comment:  benign No date: History of shingles No date: Hyperlipidemia     Comment:  takes Pravastatin nightly No date: Hypertension     Comment:  takes Lisinopril-HCTZ and Metoprolol daily No date: Lupus (HCC)     Comment:  takes Plaquenil and Imran daily No date: Nocturia No date: Numbness and tingling     Comment:  left hip/leg/foot No date: Peripheral vascular disease ([place]) No date: Urinary frequency  Past Surgical History: 2012: CARDIAC CATHETERIZATION No date: COLONOSCOPY No date: CORONARY ANGIOPLASTY     Comment:  1  stent No date: ESOPHAGOGASTRODUODENOSCOPY 02/03/2015: LUMBAR LAMINECTOMY/DECOMPRESSION MICRODISCECTOMY; Left     Comment:  Procedure: LUMBAR LAMINECTOMY/DECOMPRESSION               MICRODISCECTOMY LEFT LUMBAR FOUR-FIVE WITH REMOVAL OF               SYNOVIAL CYST;  Surgeon: Newman Pies, MD;  Location:               [facility];  Service: Neurosurgery;  Laterality: Left; 05/02/2018: PELVIC ANGIOGRAPHY; N/A     Comment:  Procedure: PELVIC ANGIOGRAPHY;  Surgeon: Katha Cabal, MD;  Location: [facility];  Service:              Cardiovascular;  Laterality: N/A;  BMI    Body Mass Index:  25.31 kg/m      Reproductive/Obstetrics negative OB ROS                            Anesthesia Physical Anesthesia Plan  ASA: III  Anesthesia Plan: General   Post-op Pain Management:    Induction:   PONV Risk Score and Plan: Propofol infusion and TIVA  Airway Management Planned:   Additional Equipment:   Intra-op Plan:   Post-operative Plan:  Informed Consent: I have reviewed the patients History and Physical, chart, labs and discussed the procedure including the risks, benefits and alternatives for the proposed anesthesia with the patient or authorized representative who has indicated his/her understanding and acceptance.     Dental Advisory Given  Plan Discussed with: Anesthesiologist and CRNA  Anesthesia Plan Comments:         Anesthesia Quick Evaluation

## 2018-12-04 NOTE — H&P (Signed)
Paper H&P to be scanned into permanent record. H&P reviewed and patient re-examined. No changes. 

## 2018-12-04 NOTE — Discharge Instructions (Addendum)
General Anesthesia, Adult, Care After °This sheet gives you information about how to care for yourself after your procedure. Your health care provider may also give you more specific instructions. If you have problems or questions, contact your health care provider. °What can I expect after the procedure? °After the procedure, the following side effects are common: °· Pain or discomfort at the IV site. °· Nausea. °· Vomiting. °· Sore throat. °· Trouble concentrating. °· Feeling cold or chills. °· Weak or tired. °· Sleepiness and fatigue. °· Soreness and body aches. These side effects can affect parts of the body that were not involved in surgery. °Follow these instructions at home: ° °For at least 24 hours after the procedure: °· Have a responsible adult stay with you. It is important to have someone help care for you until you are awake and alert. °· Rest as needed. °· Do not: °? Participate in activities in which you could fall or become injured. °? Drive. °? Use heavy machinery. °? Drink alcohol. °? Take sleeping pills or medicines that cause drowsiness. °? Make important decisions or sign legal documents. °? Take care of children on your own. °Eating and drinking °· Follow any instructions from your health care provider about eating or drinking restrictions. °· When you feel hungry, start by eating small amounts of foods that are soft and easy to digest (bland), such as toast. Gradually return to your regular diet. °· Drink enough fluid to keep your urine pale yellow. °· If you vomit, rehydrate by drinking water, juice, or clear broth. °General instructions °· If you have sleep apnea, surgery and certain medicines can increase your risk for breathing problems. Follow instructions from your health care provider about wearing your sleep device: °? Anytime you are sleeping, including during daytime naps. °? While taking prescription pain medicines, sleeping medicines, or medicines that make you drowsy. °· Return to  your normal activities as told by your health care provider. Ask your health care provider what activities are safe for you. °· Take over-the-counter and prescription medicines only as told by your health care provider. °· If you smoke, do not smoke without supervision. °· Keep all follow-up visits as told by your health care provider. This is important. °Contact a health care provider if: °· You have nausea or vomiting that does not get better with medicine. °· You cannot eat or drink without vomiting. °· You have pain that does not get better with medicine. °· You are unable to pass urine. °· You develop a skin rash. °· You have a fever. °· You have redness around your IV site that gets worse. °Get help right away if: °· You have difficulty breathing. °· You have chest pain. °· You have blood in your urine or stool, or you vomit blood. °Summary °· After the procedure, it is common to have a sore throat or nausea. It is also common to feel tired. °· Have a responsible adult stay with you for the first 24 hours after general anesthesia. It is important to have someone help care for you until you are awake and alert. °· When you feel hungry, start by eating small amounts of foods that are soft and easy to digest (bland), such as toast. Gradually return to your regular diet. °· Drink enough fluid to keep your urine pale yellow. °· Return to your normal activities as told by your health care provider. Ask your health care provider what activities are safe for you. °This information is not   intended to replace advice given to you by your health care provider. Make sure you discuss any questions you have with your health care provider. Document Released: 02/04/2001 Document Revised: 06/14/2017 Document Reviewed: 06/14/2017 Elsevier Interactive Patient Education  2019 [place] discharge instructions: Keep dressing dry and intact.  May shower after dressing changed on post-op day #4 (Monday).  Cover  staples with Band-Aids after drying off. Apply ice frequently to shoulder. Take ibuprofen 800 mg TID with meals for 7-10 days, then as necessary. Take pain medication as prescribed when needed.  May supplement with ES Tylenol if necessary. Keep shoulder immobilizer on at all times except may remove for bathing purposes. Follow-up in 10-14 days or as scheduled.

## 2018-12-04 NOTE — Anesthesia Procedure Notes (Signed)
Anesthesia Regional Block: Interscalene brachial plexus block   Pre-Anesthetic Checklist: ,, timeout performed, Correct Patient, Correct Site, Correct Laterality, Correct Procedure, Correct Position, site marked, Risks and benefits discussed,  Surgical consent,  Pre-op evaluation,  At surgeon's request and post-op pain management  Laterality: Right  Prep: chloraprep       Needles:  Injection technique: Single-shot  Needle Type: Stimiplex     Needle Length: 9cm  Needle Gauge: 21     Additional Needles:   Narrative:  Start time: 12/04/2018 2:15 PM End time: 12/04/2018 2:26 PM  Performed by: Personally  Anesthesiologist: Durenda Hurt, MD

## 2018-12-04 NOTE — Anesthesia Procedure Notes (Signed)
Procedure Name: Intubation Date/Time: 12/04/2018 3:32 PM Performed by: Jonna Clark, CRNA Pre-anesthesia Checklist: Patient identified, Patient being monitored, Timeout performed, Emergency Drugs available and Suction available Patient Re-evaluated:Patient Re-evaluated prior to induction Oxygen Delivery Method: Circle system utilized Preoxygenation: Pre-oxygenation with 100% oxygen Induction Type: IV induction Ventilation: Mask ventilation without difficulty Laryngoscope Size: 4 Grade View: Grade II Tube type: Oral Tube size: 7.5 mm Number of attempts: 1 Airway Equipment and Method: Stylet Placement Confirmation: ETT inserted through vocal cords under direct vision,  positive ETCO2 and breath sounds checked- equal and bilateral Secured at: 22 cm Tube secured with: Tape Dental Injury: Teeth and Oropharynx as per pre-operative assessment

## 2018-12-04 NOTE — Anesthesia Post-op Follow-up Note (Signed)
Anesthesia QCDR form completed.        

## 2018-12-05 ENCOUNTER — Encounter: Payer: Self-pay | Admitting: Surgery

## 2018-12-05 DIAGNOSIS — M75111 Incomplete rotator cuff tear or rupture of right shoulder, not specified as traumatic: Secondary | ICD-10-CM | POA: Insufficient documentation

## 2018-12-16 ENCOUNTER — Ambulatory Visit (INDEPENDENT_AMBULATORY_CARE_PROVIDER_SITE_OTHER): Payer: Medicare Other | Admitting: Urology

## 2018-12-16 ENCOUNTER — Encounter: Payer: Self-pay | Admitting: Urology

## 2018-12-16 VITALS — BP 180/100 | HR 97 | Ht 72.0 in | Wt 188.6 lb

## 2018-12-16 DIAGNOSIS — A630 Anogenital (venereal) warts: Secondary | ICD-10-CM | POA: Diagnosis not present

## 2018-12-16 NOTE — Progress Notes (Signed)
12/16/2018 5:15 PM   Sergio Callahan 12/10/1950 [id number]  Referring provider: Baxter Hire, MD [place], [place] [address]  CC: Penile lesions  HPI: I saw Sergio Callahan in urology clinic today in consultation for penile lesions from Dr. Edwina Barth.  He has a number of co-morbidities including CAD and PVD with stents on Plavix.  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  He reports enlarging lesions that are nonpainful at the base of the penis over the last year.  He was previously managed by Dr. Burt Ek and [facility] with an office freezing treatments and imiquimod cream.  He reports he has had over 10 of these treatments.  Last treatment was over 1 year ago.  He is very rarely sexually active, but does take sildenafil occasionally for erections.  He takes Flomax for mild urinary symptoms including weak stream.  There are no aggravating factors.  Severity is mild.  PMH: Past Medical History:  Diagnosis Date  . Anemia    takes B12 injections monthly  . Anxiety    takes Xanax daily as needed  . Arthritis    back   . Back pain    synovial cyst  . Collagen vascular disease (HCC)    Hx Lupus.  . Coronary artery disease   . Enlarged prostate    takes Flomax daily-slightly  . GERD (gastroesophageal reflux disease)    takes Omeprazole daily  . History of colon polyps    benign  . History of shingles   . Hyperlipidemia    takes Pravastatin nightly  . Hypertension    takes Lisinopril-HCTZ and Metoprolol daily  . Lupus ([place])    takes Plaquenil and Imran daily  . Nocturia   . Numbness and tingling    left hip/leg/foot  . Peripheral vascular disease ([place])   . Urinary frequency     Surgical History: Past Surgical History:  Procedure Laterality Date  . CARDIAC CATHETERIZATION  2012  . COLONOSCOPY    . CORONARY ANGIOPLASTY     1 stent  . ESOPHAGOGASTRODUODENOSCOPY    . LUMBAR LAMINECTOMY/DECOMPRESSION MICRODISCECTOMY Left 02/03/2015   Procedure:  LUMBAR LAMINECTOMY/DECOMPRESSION MICRODISCECTOMY LEFT LUMBAR FOUR-FIVE WITH REMOVAL OF SYNOVIAL CYST;  Surgeon: Newman Pies, MD;  Location: [facility];  Service: Neurosurgery;  Laterality: Left;  . PELVIC ANGIOGRAPHY N/A 05/02/2018   Procedure: PELVIC ANGIOGRAPHY;  Surgeon: Katha Cabal, MD;  Location: [facility];  Service: Cardiovascular;  Laterality: N/A;  . SHOULDER ARTHROSCOPY WITH SUBACROMIAL DECOMPRESSION AND BICEP TENDON REPAIR Right 12/04/2018   Procedure: SHOULDER ARTHROSCOPY WITH DEBRIDEMENT, DECOMPRESSION, BICEP TENODESIS;  Surgeon: Corky Mull, MD;  Location: [facility];  Service: Orthopedics;  Laterality: Right;    Allergies:  Allergies  Allergen Reactions  . Fosamax [Alendronate Sodium]     Leg pain    Family History: Family History  Problem Relation Age of Onset  . Hyperlipidemia Father   . Hypertension Father   . Diabetes Father   . Colon cancer Father   . Hearing loss Father   . Vascular Disease Father     Social History:  reports that he quit smoking about 4 weeks ago. He has a 45.00 pack-year smoking history. He has never used smokeless tobacco. He reports current alcohol use. He reports that he does not use drugs.  ROS: Please see flowsheet from today's date for complete review of systems.  Physical Exam: BP (!) 180/100 (BP Location: Left Arm, Patient Position: Sitting, Cuff  Size: Normal)   Pulse 97   Ht 6' (1.829 m)   Wt 188 lb 9.6 oz (85.5 kg)   BMI 25.58 kg/m    Constitutional:  Alert and oriented, No acute distress. Cardiovascular: No clubbing, cyanosis, or edema. Respiratory: Normal respiratory effort, no increased work of breathing. GI: Abdomen is soft, nontender, nondistended, no abdominal masses GU: No CVA tenderness, circumcised phallus with multiple penile warts at the left base and right side of the shaft Lymph: No cervical or inguinal lymphadenopathy. Neurologic: Right arm in sling Psychiatric: Normal mood and  affect.  Laboratory Data: None to review  Pertinent Imaging: None to review  Assessment & Plan:   In summary, the patient is a 68 year old comorbid male on anticoagulation, and history of HPV, who presents with recurrent penile warts.  He has previously undergone successful freezing treatments of these in clinic, and would like to pursue this again.  I discussed that he would not be a good candidate for excision secondary to the extent of his lesions, as well as his anticoagulation.  RTC 1 to 2 weeks for lesion removal with cryotherapy  [name], MD  [facility] [address] [place], [place] [address] [phone]

## 2018-12-22 ENCOUNTER — Encounter: Payer: Self-pay | Admitting: Urology

## 2018-12-22 ENCOUNTER — Ambulatory Visit (INDEPENDENT_AMBULATORY_CARE_PROVIDER_SITE_OTHER): Payer: Medicare Other | Admitting: Urology

## 2018-12-22 VITALS — BP 160/91 | HR 118 | Ht 72.0 in | Wt 189.0 lb

## 2018-12-22 DIAGNOSIS — A630 Anogenital (venereal) warts: Secondary | ICD-10-CM | POA: Diagnosis not present

## 2018-12-22 NOTE — Progress Notes (Addendum)
Procedure Note:  Indication: Genital warts, history of HPV  After informed consent and discussion of the procedure and its risks, Sergio Callahan was positioned and prepped in the standard fashion.  The warts were located at the left base of the penis and the right ventral shaft.  These were treated with Histofreeze with application for 45 seconds to each lesion.  No bleeding or complications.   Findings: Uncomplicated treatment of genital warts  Assessment and Plan: RTC as needed  Nickolas Madrid, MD 12/22/2018

## 2019-03-27 ENCOUNTER — Ambulatory Visit: Admit: 2019-03-27 | Payer: PRIVATE HEALTH INSURANCE | Admitting: Unknown Physician Specialty

## 2019-03-27 SURGERY — COLONOSCOPY WITH PROPOFOL
Anesthesia: General

## 2019-08-21 ENCOUNTER — Other Ambulatory Visit: Admission: RE | Admit: 2019-08-21 | Payer: PRIVATE HEALTH INSURANCE | Source: Ambulatory Visit

## 2019-09-07 ENCOUNTER — Other Ambulatory Visit: Payer: Self-pay | Admitting: Optometrist

## 2019-09-07 DIAGNOSIS — M545 Low back pain, unspecified: Secondary | ICD-10-CM

## 2019-09-07 DIAGNOSIS — M359 Systemic involvement of connective tissue, unspecified: Secondary | ICD-10-CM

## 2019-09-07 DIAGNOSIS — R899 Unspecified abnormal finding in specimens from other organs, systems and tissues: Secondary | ICD-10-CM

## 2019-09-10 ENCOUNTER — Other Ambulatory Visit: Payer: Self-pay | Admitting: Rheumatology

## 2019-09-10 DIAGNOSIS — M359 Systemic involvement of connective tissue, unspecified: Secondary | ICD-10-CM

## 2019-09-10 DIAGNOSIS — R899 Unspecified abnormal finding in specimens from other organs, systems and tissues: Secondary | ICD-10-CM

## 2019-09-10 DIAGNOSIS — M545 Low back pain, unspecified: Secondary | ICD-10-CM

## 2019-09-11 ENCOUNTER — Ambulatory Visit
Admission: RE | Admit: 2019-09-11 | Discharge: 2019-09-11 | Disposition: A | Discharge status: Home / Self Care | Payer: Medicare Other | Source: Ambulatory Visit | Attending: Optometrist | Admitting: Optometrist

## 2019-09-11 ENCOUNTER — Other Ambulatory Visit: Payer: Self-pay

## 2019-09-11 ENCOUNTER — Other Ambulatory Visit: Payer: PRIVATE HEALTH INSURANCE

## 2019-09-11 DIAGNOSIS — R899 Unspecified abnormal finding in specimens from other organs, systems and tissues: Secondary | ICD-10-CM | POA: Diagnosis present

## 2019-09-16 ENCOUNTER — Ambulatory Visit
Admission: RE | Admit: 2019-09-16 | Discharge: 2019-09-16 | Disposition: A | Discharge status: Home / Self Care | Payer: Medicare Other | Source: Ambulatory Visit | Attending: Optometrist | Admitting: Optometrist

## 2019-09-16 ENCOUNTER — Other Ambulatory Visit: Payer: Self-pay

## 2019-09-16 DIAGNOSIS — M545 Low back pain, unspecified: Secondary | ICD-10-CM

## 2019-09-16 DIAGNOSIS — M359 Systemic involvement of connective tissue, unspecified: Secondary | ICD-10-CM | POA: Diagnosis present

## 2019-09-25 ENCOUNTER — Other Ambulatory Visit
Admission: RE | Admit: 2019-09-25 | Discharge: 2019-09-25 | Disposition: A | Discharge status: Home / Self Care | Payer: Medicare Other | Source: Ambulatory Visit | Attending: General Surgery | Admitting: General Surgery

## 2019-09-25 ENCOUNTER — Other Ambulatory Visit: Payer: Self-pay

## 2019-09-25 DIAGNOSIS — Z01812 Encounter for preprocedural laboratory examination: Secondary | ICD-10-CM | POA: Diagnosis present

## 2019-09-25 DIAGNOSIS — Z20828 Contact with and (suspected) exposure to other viral communicable diseases: Secondary | ICD-10-CM | POA: Diagnosis not present

## 2019-09-25 LAB — SARS CORONAVIRUS 2 (TAT 6-24 HRS): SARS Coronavirus 2: NEGATIVE

## 2019-09-30 ENCOUNTER — Encounter
Admission: RE | Disposition: A | Discharge status: Home / Self Care | Payer: Self-pay | Source: Home / Self Care | Attending: General Surgery

## 2019-09-30 ENCOUNTER — Ambulatory Visit: Payer: Medicare Other | Admitting: Anesthesiology

## 2019-09-30 ENCOUNTER — Other Ambulatory Visit: Payer: Self-pay

## 2019-09-30 ENCOUNTER — Ambulatory Visit
Admission: RE | Admit: 2019-09-30 | Discharge: 2019-09-30 | Disposition: A | Discharge status: Home / Self Care | Payer: Medicare Other | Attending: General Surgery | Admitting: General Surgery

## 2019-09-30 ENCOUNTER — Encounter: Payer: Self-pay | Admitting: *Deleted

## 2019-09-30 DIAGNOSIS — Z86010 Personal history of colonic polyps: Secondary | ICD-10-CM | POA: Insufficient documentation

## 2019-09-30 DIAGNOSIS — Z1211 Encounter for screening for malignant neoplasm of colon: Secondary | ICD-10-CM | POA: Diagnosis not present

## 2019-09-30 DIAGNOSIS — Z7982 Long term (current) use of aspirin: Secondary | ICD-10-CM | POA: Diagnosis not present

## 2019-09-30 DIAGNOSIS — F419 Anxiety disorder, unspecified: Secondary | ICD-10-CM | POA: Diagnosis not present

## 2019-09-30 DIAGNOSIS — Z888 Allergy status to other drugs, medicaments and biological substances status: Secondary | ICD-10-CM | POA: Insufficient documentation

## 2019-09-30 DIAGNOSIS — I10 Essential (primary) hypertension: Secondary | ICD-10-CM | POA: Diagnosis not present

## 2019-09-30 DIAGNOSIS — K219 Gastro-esophageal reflux disease without esophagitis: Secondary | ICD-10-CM | POA: Insufficient documentation

## 2019-09-30 DIAGNOSIS — I739 Peripheral vascular disease, unspecified: Secondary | ICD-10-CM | POA: Insufficient documentation

## 2019-09-30 DIAGNOSIS — Z7902 Long term (current) use of antithrombotics/antiplatelets: Secondary | ICD-10-CM | POA: Insufficient documentation

## 2019-09-30 DIAGNOSIS — E785 Hyperlipidemia, unspecified: Secondary | ICD-10-CM | POA: Diagnosis not present

## 2019-09-30 DIAGNOSIS — N400 Enlarged prostate without lower urinary tract symptoms: Secondary | ICD-10-CM | POA: Diagnosis not present

## 2019-09-30 DIAGNOSIS — M329 Systemic lupus erythematosus, unspecified: Secondary | ICD-10-CM | POA: Insufficient documentation

## 2019-09-30 DIAGNOSIS — Z79899 Other long term (current) drug therapy: Secondary | ICD-10-CM | POA: Insufficient documentation

## 2019-09-30 DIAGNOSIS — I2510 Atherosclerotic heart disease of native coronary artery without angina pectoris: Secondary | ICD-10-CM | POA: Diagnosis not present

## 2019-09-30 DIAGNOSIS — M479 Spondylosis, unspecified: Secondary | ICD-10-CM | POA: Insufficient documentation

## 2019-09-30 HISTORY — PX: COLONOSCOPY WITH PROPOFOL: SHX5780

## 2019-09-30 SURGERY — COLONOSCOPY WITH PROPOFOL
Anesthesia: General

## 2019-09-30 MED ORDER — PROPOFOL 500 MG/50ML IV EMUL
INTRAVENOUS | Status: DC | PRN
  Administered 2019-09-30: 140 ug/kg/min via INTRAVENOUS

## 2019-09-30 MED ORDER — PROPOFOL 10 MG/ML IV BOLUS
INTRAVENOUS | Status: DC | PRN
  Administered 2019-09-30: 70 mg via INTRAVENOUS
  Administered 2019-09-30: 30 mg via INTRAVENOUS

## 2019-09-30 MED ORDER — SODIUM CHLORIDE 0.9 % IV SOLN
INTRAVENOUS | Status: DC
  Administered 2019-09-30: 09:00:00 via INTRAVENOUS

## 2019-09-30 NOTE — Anesthesia Postprocedure Evaluation (Signed)
Anesthesia Post Note  Patient: Sergio Callahan  Procedure(s) Performed: COLONOSCOPY WITH PROPOFOL (N/A )  Patient location during evaluation: PACU Anesthesia Type: General Level of consciousness: awake and alert Pain management: pain level controlled Vital Signs Assessment: post-procedure vital signs reviewed and stable Respiratory status: spontaneous breathing, nonlabored ventilation and respiratory function stable Cardiovascular status: blood pressure returned to baseline and stable Postop Assessment: no apparent nausea or vomiting Anesthetic complications: no     Last Vitals:  Vitals:   09/30/19 1042 09/30/19 1052  BP: (!) 150/93 (!) 140/92  Pulse: 74 74  Resp: 14 19  Temp:    SpO2: 98% 100%    Last Pain:  Vitals:   09/30/19 1052  TempSrc:   PainSc: 0-No pain                 Durenda Hurt

## 2019-09-30 NOTE — Anesthesia Post-op Follow-up Note (Signed)
Anesthesia QCDR form completed.        

## 2019-09-30 NOTE — Op Note (Signed)
[facility] Gastroenterology Patient Name: Sergio Callahan Procedure Date: 09/30/2019 9:44 AM MRN: [id number] Account #: [id number] Date of Birth: July 13, 1951 Admit Type: Outpatient Age: 68 Room: [facility] ENDO ROOM 1 Gender: Male Note Status: Finalized Procedure:             Colonoscopy Indications:           High risk colon cancer surveillance: Personal history                         of colonic polyps Providers:             Robert Bellow, MD Medicines:             Monitored Anesthesia Care Complications:         No immediate complications. Procedure:             Pre-Anesthesia Assessment:                        - Prior to the procedure, a History and Physical was                         performed, and patient medications, allergies and                         sensitivities were reviewed. The patient's tolerance                         of previous anesthesia was reviewed.                        - The risks and benefits of the procedure and the                         sedation options and risks were discussed with the                         patient. All questions were answered and informed                         consent was obtained.                        After obtaining informed consent, the colonoscope was                         passed under direct vision. Throughout the procedure,                         the patient's blood pressure, pulse, and oxygen                         saturations were monitored continuously. The                         Colonoscope was introduced through the anus and                         advanced to the the cecum, identified by appendiceal  orifice and ileocecal valve. The colonoscopy was                         performed without difficulty. The patient tolerated                         the procedure well. The quality of the bowel                         preparation was excellent. Findings:      The entire  examined colon appeared normal on direct and retroflexion       views. Impression:            - The entire examined colon is normal on direct and                         retroflexion views.                        - No specimens collected. Recommendation:        - Repeat colonoscopy in 5 years for surveillance. Procedure Code(s):     --- Professional ---                        [id number], Colonoscopy, flexible; diagnostic, including                         collection of specimen(s) by brushing or washing, when                         performed (separate procedure) Diagnosis Code(s):     --- Professional ---                        Z86.010, Personal history of colonic polyps CPT copyright 2019 [employer]. All rights reserved. The codes documented in this report are preliminary and upon coder review may  be revised to meet current compliance requirements. Robert Bellow, MD 09/30/2019 10:11:55 AM This report has been signed electronically. Number of Addenda: 0 Note Initiated On: 09/30/2019 9:44 AM Scope Withdrawal Time: 0 hours 9 minutes 42 seconds  Total Procedure Duration: 0 hours 12 minutes 18 seconds       [facility]

## 2019-09-30 NOTE — H&P (Signed)
Sergio Callahan [id number] 05/07/1951     HPI: 68 y/o male with past history of colon polyps.  For follow up exam. Tolerated prep well.  Held Plavix and ASA x 1 week as requested.   Medications Prior to Admission  Medication Sig Dispense Refill Last Dose  . ALPRAZolam (XANAX) 0.5 MG tablet Take 0.5 mg by mouth 3 (three) times daily as needed for anxiety.    Past Week at Unknown time  . aspirin 81 MG EC tablet Take 1 tablet (81 mg total) by mouth daily. Swallow whole. (Patient taking differently: Take 81 mg by mouth daily. ) 30 tablet 12 Past Week at Unknown time  . azaTHIOprine (IMURAN) 50 MG tablet Take 100 mg by mouth daily.    09/29/2019 at 200  . clobetasol (CORMAX SCALP APPLICATION) [id number] % external solution Apply 1 application topically 2 (two) times daily.     . cyanocobalamin (,VITAMIN B-12,) 1000 MCG/ML injection Inject 1,000 mcg into the muscle every 30 (thirty) days.    Past Week at Unknown time  . hydroxychloroquine (PLAQUENIL) 200 MG tablet Take 200 mg by mouth 2 (two) times daily.   09/29/2019 at 2000  . ibuprofen (ADVIL,MOTRIN) 200 MG tablet Take 600 mg by mouth as needed for headache or moderate pain.    Past Week at Unknown time  . imiquimod (ALDARA) 5 % cream Apply topically 3 (three) times a week. At bedtime     . lisinopril-hydrochlorothiazide (PRINZIDE,ZESTORETIC) 20-25 MG per tablet Take 1 tablet by mouth daily.   09/29/2019 at 0800  . metoprolol succinate (TOPROL-XL) 100 MG 24 hr tablet Take 100 mg by mouth at bedtime. Take with or immediately following a meal.   09/29/2019 at 8000  . omeprazole (PRILOSEC) 20 MG capsule Take 20 mg by mouth daily.    Past Week at Unknown time  . pravastatin (PRAVACHOL) 40 MG tablet Take 40 mg by mouth at bedtime.   09/29/2019 at 2000  . sildenafil (REVATIO) 20 MG tablet Take 20 mg by mouth daily as needed for erectile dysfunction.   5 Past Week at Unknown time  . tamsulosin (FLOMAX) 0.4 MG CAPS capsule Take 0.4 mg by mouth daily after  breakfast.   09/29/2019 at 800  . Triamcinolone Acetonide (NASACORT ALLERGY 24HR CHILDREN NA) Place into the nose.     . clopidogrel (PLAVIX) 75 MG tablet TAKE 1 TABLET EVERY DAY (Patient taking differently: Take 75 mg by mouth daily. ) 90 tablet 3   . desonide (VERDESO) 0.05 % foam Apply 1 application topically 2 (two) times daily as needed (rash).     . fexofenadine (ALLEGRA) 180 MG tablet Take 180 mg by mouth daily.     . fluticasone (FLONASE) 50 MCG/ACT nasal spray Place 2 sprays into both nostrils daily as needed for allergies.   11   . HYDROcodone-acetaminophen (NORCO/VICODIN) 5-325 MG tablet Take 1-2 tablets by mouth every 6 (six) hours as needed for moderate pain. 40 tablet 0   . nicotine (NICODERM CQ - DOSED IN MG/24 HOURS) 21 mg/24hr patch Place 21 mg onto the skin daily.      Allergies  Allergen Reactions  . Fosamax [Alendronate Sodium]     Leg pain   Past Medical History:  Diagnosis Date  . Anemia    takes B12 injections monthly  . Anxiety    takes Xanax daily as needed  . Arthritis    back   . Back pain    synovial cyst  . Collagen vascular  disease ([place])    Hx Lupus.  . Coronary artery disease   . Enlarged prostate    takes Flomax daily-slightly  . GERD (gastroesophageal reflux disease)    takes Omeprazole daily  . History of colon polyps    benign  . History of shingles   . Hyperlipidemia    takes Pravastatin nightly  . Hypertension    takes Lisinopril-HCTZ and Metoprolol daily  . Lupus ([place])    takes Plaquenil and Imran daily  . Nocturia   . Numbness and tingling    left hip/leg/foot  . Peripheral vascular disease ([place])   . Urinary frequency    Past Surgical History:  Procedure Laterality Date  . CARDIAC CATHETERIZATION  2012  . COLONOSCOPY    . CORONARY ANGIOPLASTY     1 stent  . ESOPHAGOGASTRODUODENOSCOPY    . LUMBAR LAMINECTOMY/DECOMPRESSION MICRODISCECTOMY Left 02/03/2015   Procedure: LUMBAR LAMINECTOMY/DECOMPRESSION MICRODISCECTOMY LEFT LUMBAR  FOUR-FIVE WITH REMOVAL OF SYNOVIAL CYST;  Surgeon: Newman Pies, MD;  Location: [place] [facility];  Service: Neurosurgery;  Laterality: Left;  . PELVIC ANGIOGRAPHY N/A 05/02/2018   Procedure: PELVIC ANGIOGRAPHY;  Surgeon: Katha Cabal, MD;  Location: [place] [facility];  Service: Cardiovascular;  Laterality: N/A;  . SHOULDER ARTHROSCOPY WITH SUBACROMIAL DECOMPRESSION AND BICEP TENDON REPAIR Right 12/04/2018   Procedure: SHOULDER ARTHROSCOPY WITH DEBRIDEMENT, DECOMPRESSION, BICEP TENODESIS;  Surgeon: Corky Mull, MD;  Location: [facility] ORS;  Service: Orthopedics;  Laterality: Right;   Social History   Socioeconomic History  . Marital status: Married    Spouse name: Not on file  . Number of children: Not on file  . Years of education: Not on file  . Highest education level: Not on file  Occupational History  . Not on file  Social Needs  . Financial resource strain: Not on file  . Food insecurity    Worry: Not on file    Inability: Not on file  . Transportation needs    Medical: Not on file    Non-medical: Not on file  Tobacco Use  . Smoking status: Former Smoker    Packs/day: 1.50    Years: 30.00    Pack years: 45.00    Quit date: 11/12/2018    Years since quitting: 0.8  . Smokeless tobacco: Never Used  Substance and Sexual Activity  . Alcohol use: Yes    Comment: cocktails about 2 daily  . Drug use: No  . Sexual activity: Yes  Lifestyle  . Physical activity    Days per week: Not on file    Minutes per session: Not on file  . Stress: Not on file  Relationships  . Social Herbalist on phone: Not on file    Gets together: Not on file    Attends religious service: Not on file    Active member of club or organization: Not on file    Attends meetings of clubs or organizations: Not on file    Relationship status: Not on file  . Intimate partner violence    Fear of current or ex partner: Not on file    Emotionally abused: Not on file    Physically abused: Not on  file    Forced sexual activity: Not on file  Other Topics Concern  . Not on file  Social History Narrative  . Not on file   Social History   Social History Narrative  . Not on file     ROS: Negative.  PE: HEENT: Negative. Lungs: Clear. Cardio: RR.   Assessment/Plan:  Proceed with planned endoscopy.   Forest Gleason Banessa Mao 09/30/2019

## 2019-09-30 NOTE — Transfer of Care (Signed)
Immediate Anesthesia Transfer of Care Note  Patient: Sergio Callahan  Procedure(s) Performed: COLONOSCOPY WITH PROPOFOL (N/A )  Patient Location: PACU  Anesthesia Type:General  Level of Consciousness: sedated  Airway & Oxygen Therapy: Patient Spontanous Breathing  Post-op Assessment: Report given to RN and Post -op Vital signs reviewed and stable  Post vital signs: Reviewed and stable  Last Vitals:  Vitals Value Taken Time  BP 137/79 09/30/19 1015  Temp 36.4 C 09/30/19 1015  Pulse 79 09/30/19 1015  Resp 12 09/30/19 1015  SpO2 96 % 09/30/19 1015    Last Pain:  Vitals:   09/30/19 1015  TempSrc:   PainSc: 0-No pain         Complications: No apparent anesthesia complications

## 2019-09-30 NOTE — Anesthesia Preprocedure Evaluation (Signed)
Anesthesia Evaluation  Patient identified by MRN, date of birth, ID band Patient awake    Reviewed: Allergy & Precautions, H&P , NPO status , Patient's Chart, lab work & pertinent test results  History of Anesthesia Complications Negative for: history of anesthetic complications  Airway Mallampati: II  TM Distance: >3 FB Neck ROM: full    Dental  (+) Chipped   Pulmonary neg pulmonary ROS, neg shortness of breath, neg COPD, neg recent URI, former smoker,           Cardiovascular hypertension, (-) angina+ CAD and + Peripheral Vascular Disease  (-) Past MI and (-) Cardiac Stents (-) dysrhythmias      Neuro/Psych PSYCHIATRIC DISORDERS Anxiety negative neurological ROS     GI/Hepatic Neg liver ROS, GERD  Controlled,  Endo/Other  negative endocrine ROS  Renal/GU negative Renal ROS  negative genitourinary   Musculoskeletal   Abdominal   Peds  Hematology negative hematology ROS (+)   Anesthesia Other Findings Past Medical History: No date: Anemia     Comment:  takes B12 injections monthly No date: Anxiety     Comment:  takes Xanax daily as needed No date: Arthritis     Comment:  back  No date: Back pain     Comment:  synovial cyst No date: Collagen vascular disease ([facility])     Comment:  Hx Lupus. No date: Coronary artery disease No date: Enlarged prostate     Comment:  takes Flomax daily-slightly No date: GERD (gastroesophageal reflux disease)     Comment:  takes Omeprazole daily No date: History of colon polyps     Comment:  benign No date: History of shingles No date: Hyperlipidemia     Comment:  takes Pravastatin nightly No date: Hypertension     Comment:  takes Lisinopril-HCTZ and Metoprolol daily No date: Lupus ([facility])     Comment:  takes Plaquenil and Imran daily No date: Nocturia No date: Numbness and tingling     Comment:  left hip/leg/foot No date: Peripheral vascular disease ([place]) No date: Urinary  frequency  Past Surgical History: 2012: CARDIAC CATHETERIZATION No date: COLONOSCOPY No date: CORONARY ANGIOPLASTY     Comment:  1 stent No date: ESOPHAGOGASTRODUODENOSCOPY 02/03/2015: LUMBAR LAMINECTOMY/DECOMPRESSION MICRODISCECTOMY; Left     Comment:  Procedure: LUMBAR LAMINECTOMY/DECOMPRESSION               MICRODISCECTOMY LEFT LUMBAR FOUR-FIVE WITH REMOVAL OF               SYNOVIAL CYST;  Surgeon: Newman Pies, MD;  Location:               [place] [facility]UR[facility] OR[facility];  Service: Neurosurgery;  Laterality: Left; 05/02/2018: PELVIC ANGIOGRAPHY; N/A     Comment:  Procedure: PELVIC ANGIOGRAPHY;  Surgeon: Katha Cabal, MD;  Location: [place] [facility];  Service:              Cardiovascular;  Laterality: N/A; 12/04/2018: SHOULDER ARTHROSCOPY WITH SUBACROMIAL DECOMPRESSION AND  BICEP TENDON REPAIR; Right     Comment:  Procedure: SHOULDER ARTHROSCOPY WITH DEBRIDEMENT,               DECOMPRESSION, BICEP TENODESIS;  Surgeon: Corky Mull,               MD;  Location: [facility] ORS;  Service: Orthopedics;  Laterality: Right;  BMI    Body Mass Index: 26.45 kg/m      Reproductive/Obstetrics negative OB ROS                             Anesthesia Physical Anesthesia Plan  ASA: II  Anesthesia Plan: General   Post-op Pain Management:    Induction:   PONV Risk Score and Plan: Propofol infusion and TIVA  Airway Management Planned: Natural Airway and Nasal Cannula  Additional Equipment:   Intra-op Plan:   Post-operative Plan:   Informed Consent: I have reviewed the patients History and Physical, chart, labs and discussed the procedure including the risks, benefits and alternatives for the proposed anesthesia with the patient or authorized representative who has indicated his/her understanding and acceptance.     Dental Advisory Given  Plan Discussed with: Anesthesiologist, CRNA and Surgeon  Anesthesia Plan Comments:          Anesthesia Quick Evaluation

## 2019-10-01 ENCOUNTER — Encounter: Payer: Self-pay | Admitting: General Surgery

## 2019-10-15 ENCOUNTER — Other Ambulatory Visit: Payer: Self-pay

## 2019-10-15 ENCOUNTER — Encounter: Payer: Self-pay | Admitting: *Deleted

## 2019-10-20 ENCOUNTER — Other Ambulatory Visit
Admission: RE | Admit: 2019-10-20 | Discharge: 2019-10-20 | Disposition: A | Discharge status: Home / Self Care | Payer: Medicare Other | Source: Ambulatory Visit | Attending: Unknown Physician Specialty | Admitting: Unknown Physician Specialty

## 2019-10-20 DIAGNOSIS — Z20828 Contact with and (suspected) exposure to other viral communicable diseases: Secondary | ICD-10-CM | POA: Diagnosis not present

## 2019-10-20 DIAGNOSIS — Z01812 Encounter for preprocedural laboratory examination: Secondary | ICD-10-CM | POA: Diagnosis present

## 2019-10-21 LAB — SARS CORONAVIRUS 2 (TAT 6-24 HRS): SARS Coronavirus 2: NEGATIVE

## 2019-10-23 ENCOUNTER — Ambulatory Visit: Payer: Medicare Other | Admitting: Anesthesiology

## 2019-10-23 ENCOUNTER — Encounter: Payer: Self-pay | Admitting: Unknown Physician Specialty

## 2019-10-23 ENCOUNTER — Encounter
Admission: RE | Disposition: A | Discharge status: Home / Self Care | Payer: Self-pay | Source: Home / Self Care | Attending: Unknown Physician Specialty

## 2019-10-23 ENCOUNTER — Ambulatory Visit
Admission: RE | Admit: 2019-10-23 | Discharge: 2019-10-23 | Disposition: A | Discharge status: Home / Self Care | Payer: Medicare Other | Attending: Unknown Physician Specialty | Admitting: Unknown Physician Specialty

## 2019-10-23 ENCOUNTER — Other Ambulatory Visit: Payer: Self-pay

## 2019-10-23 DIAGNOSIS — I739 Peripheral vascular disease, unspecified: Secondary | ICD-10-CM | POA: Insufficient documentation

## 2019-10-23 DIAGNOSIS — Z79899 Other long term (current) drug therapy: Secondary | ICD-10-CM | POA: Diagnosis not present

## 2019-10-23 DIAGNOSIS — Z888 Allergy status to other drugs, medicaments and biological substances status: Secondary | ICD-10-CM | POA: Insufficient documentation

## 2019-10-23 DIAGNOSIS — J343 Hypertrophy of nasal turbinates: Secondary | ICD-10-CM | POA: Diagnosis not present

## 2019-10-23 DIAGNOSIS — Z885 Allergy status to narcotic agent status: Secondary | ICD-10-CM | POA: Diagnosis not present

## 2019-10-23 DIAGNOSIS — I10 Essential (primary) hypertension: Secondary | ICD-10-CM | POA: Diagnosis not present

## 2019-10-23 DIAGNOSIS — E785 Hyperlipidemia, unspecified: Secondary | ICD-10-CM | POA: Insufficient documentation

## 2019-10-23 DIAGNOSIS — M479 Spondylosis, unspecified: Secondary | ICD-10-CM | POA: Insufficient documentation

## 2019-10-23 DIAGNOSIS — Z87891 Personal history of nicotine dependence: Secondary | ICD-10-CM | POA: Insufficient documentation

## 2019-10-23 DIAGNOSIS — Z955 Presence of coronary angioplasty implant and graft: Secondary | ICD-10-CM | POA: Diagnosis not present

## 2019-10-23 DIAGNOSIS — L930 Discoid lupus erythematosus: Secondary | ICD-10-CM | POA: Insufficient documentation

## 2019-10-23 DIAGNOSIS — Z7982 Long term (current) use of aspirin: Secondary | ICD-10-CM | POA: Diagnosis not present

## 2019-10-23 DIAGNOSIS — K219 Gastro-esophageal reflux disease without esophagitis: Secondary | ICD-10-CM | POA: Diagnosis not present

## 2019-10-23 DIAGNOSIS — F419 Anxiety disorder, unspecified: Secondary | ICD-10-CM | POA: Insufficient documentation

## 2019-10-23 DIAGNOSIS — I2510 Atherosclerotic heart disease of native coronary artery without angina pectoris: Secondary | ICD-10-CM | POA: Diagnosis not present

## 2019-10-23 DIAGNOSIS — Z7902 Long term (current) use of antithrombotics/antiplatelets: Secondary | ICD-10-CM | POA: Diagnosis not present

## 2019-10-23 DIAGNOSIS — J342 Deviated nasal septum: Secondary | ICD-10-CM | POA: Diagnosis present

## 2019-10-23 DIAGNOSIS — D649 Anemia, unspecified: Secondary | ICD-10-CM | POA: Diagnosis not present

## 2019-10-23 HISTORY — PX: NASAL SEPTOPLASTY W/ TURBINOPLASTY: SHX2070

## 2019-10-23 SURGERY — SEPTOPLASTY, NOSE, WITH NASAL TURBINATE REDUCTION
Anesthesia: General | Site: Nose | Laterality: Bilateral

## 2019-10-23 MED ORDER — ONDANSETRON HCL 4 MG/2ML IJ SOLN
INTRAMUSCULAR | Status: DC | PRN
  Administered 2019-10-23: 4 mg via INTRAVENOUS

## 2019-10-23 MED ORDER — DEXAMETHASONE SODIUM PHOSPHATE 4 MG/ML IJ SOLN
INTRAMUSCULAR | Status: DC | PRN
  Administered 2019-10-23: 8 mg via INTRAVENOUS

## 2019-10-23 MED ORDER — OXYMETAZOLINE HCL 0.05 % NA SOLN
6.0000 | Freq: Once | NASAL | Status: AC
  Administered 2019-10-23: 6 via NASAL

## 2019-10-23 MED ORDER — GLYCOPYRROLATE 0.2 MG/ML IJ SOLN
INTRAMUSCULAR | Status: DC | PRN
  Administered 2019-10-23: .1 mg via INTRAVENOUS

## 2019-10-23 MED ORDER — MIDAZOLAM HCL 5 MG/5ML IJ SOLN
INTRAMUSCULAR | Status: DC | PRN
  Administered 2019-10-23: 2 mg via INTRAVENOUS

## 2019-10-23 MED ORDER — ONDANSETRON HCL 4 MG PO TABS: 4.0000 mg | ORAL_TABLET | Freq: Three times a day (TID) | ORAL | 1 refills | Status: AC | PRN

## 2019-10-23 MED ORDER — FENTANYL CITRATE (PF) 100 MCG/2ML IJ SOLN
INTRAMUSCULAR | Status: DC | PRN
  Administered 2019-10-23: 100 ug via INTRAVENOUS

## 2019-10-23 MED ORDER — SUCCINYLCHOLINE CHLORIDE 20 MG/ML IJ SOLN
INTRAMUSCULAR | Status: DC | PRN
  Administered 2019-10-23: 100 mg via INTRAVENOUS

## 2019-10-23 MED ORDER — LIDOCAINE HCL (CARDIAC) PF 100 MG/5ML IV SOSY
PREFILLED_SYRINGE | INTRAVENOUS | Status: DC | PRN
  Administered 2019-10-23: 20 mg via INTRAVENOUS

## 2019-10-23 MED ORDER — LACTATED RINGERS IV SOLN
100.0000 mL/h | INTRAVENOUS | Status: DC
  Administered 2019-10-23: 100 mL/h via INTRAVENOUS

## 2019-10-23 MED ORDER — SCOPOLAMINE 1 MG/3DAYS TD PT72
1.0000 | MEDICATED_PATCH | Freq: Once | TRANSDERMAL | Status: DC
  Administered 2019-10-23: 1.5 mg via TRANSDERMAL

## 2019-10-23 MED ORDER — PHENYLEPHRINE HCL 0.5 % NA SOLN
NASAL | Status: DC | PRN
  Administered 2019-10-23: 12:00:00 15 mL via TOPICAL

## 2019-10-23 MED ORDER — ACETAMINOPHEN 10 MG/ML IV SOLN
1000.0000 mg | Freq: Once | INTRAVENOUS | Status: AC
  Administered 2019-10-23: 1000 mg via INTRAVENOUS

## 2019-10-23 MED ORDER — PROPOFOL 10 MG/ML IV BOLUS
INTRAVENOUS | Status: DC | PRN
  Administered 2019-10-23: 180 mg via INTRAVENOUS

## 2019-10-23 MED ORDER — SULFAMETHOXAZOLE-TRIMETHOPRIM 800-160 MG PO TABS: 1.0000 | ORAL_TABLET | Freq: Two times a day (BID) | ORAL | 0 refills | Status: DC

## 2019-10-23 MED ORDER — LIDOCAINE-EPINEPHRINE 1 %-1:100000 IJ SOLN
INTRAMUSCULAR | Status: DC | PRN
  Administered 2019-10-23: 12 mL

## 2019-10-23 MED ORDER — HYDROCODONE-ACETAMINOPHEN 5-300 MG PO TABS: 1.0000 | ORAL_TABLET | ORAL | 0 refills | Status: DC | PRN

## 2019-10-23 SURGICAL SUPPLY — 22 items
COAG SUCT 10F 3.5MM HAND CTRL (MISCELLANEOUS) ×3 IMPLANT
DRAPE HEAD BAR (DRAPES) ×3 IMPLANT
DRESSING NASL FOAM PST OP SINU (MISCELLANEOUS) ×2 IMPLANT
DRSG NASAL FOAM POST OP SINU (MISCELLANEOUS) ×6
ELECT REM PT RETURN 9FT ADLT (ELECTROSURGICAL) ×3
ELECTRODE REM PT RTRN 9FT ADLT (ELECTROSURGICAL) ×1 IMPLANT
GLOVE BIO SURGEON STRL SZ7.5 (GLOVE) ×6 IMPLANT
HANDLE YANKAUER SUCT BULB TIP (MISCELLANEOUS) ×3 IMPLANT
KIT TURNOVER KIT A (KITS) ×3 IMPLANT
NEEDLE HYPO 25GX1X1/2 BEV (NEEDLE) ×3 IMPLANT
PACK ENT CUSTOM (PACKS) ×3 IMPLANT
SPLINT NASAL SEPTAL BLV .25 LG (MISCELLANEOUS) ×3 IMPLANT
SPONGE NEURO XRAY DETECT 1X3 (DISPOSABLE) ×3 IMPLANT
STRAP BODY AND KNEE 60X3 (MISCELLANEOUS) ×3 IMPLANT
SUT CHROMIC 3-0 (SUTURE) ×2
SUT CHROMIC 3-0 KS 27XMFL CR (SUTURE) ×1
SUT ETHILON 3-0 KS 30 BLK (SUTURE) ×3 IMPLANT
SUT PLAIN GUT 4-0 (SUTURE) ×3 IMPLANT
SUTURE CHRMC 3-0 KS 27XMFL CR (SUTURE) ×1 IMPLANT
SYR 10ML LL (SYRINGE) ×3 IMPLANT
TOWEL OR 17X26 4PK STRL BLUE (TOWEL DISPOSABLE) ×3 IMPLANT
WATER STERILE IRR 250ML POUR (IV SOLUTION) ×3 IMPLANT

## 2019-10-23 NOTE — Op Note (Signed)
PREOPERATIVE DIAGNOSIS:  Chronic nasal obstruction.  POSTOPERATIVE DIAGNOSIS:  Chronic nasal obstruction.  SURGEON:  Roena Malady, M.D.  NAME OF PROCEDURE:  1. Nasal septoplasty. 2. Submucous resection of inferior turbinates.  OPERATIVE FINDINGS:  Severe nasal septal deformity, hypertrophy of the inferior turbinates.   DESCRIPTION OF THE PROCEDURE:  Sergio GUILLETTE was identified in the holding area and taken to the operating room and placed in the supine position.  After general endotracheal anesthesia was induced, the table was turned 45 degrees and the patient was placed in a semi-Fowler position.  The nose was then topically anesthetized with Lidocaine, cotton pledgets were placed within each nostril. After approximately 5 minutes, this was removed at which time a local anesthetic of 1% Lidocaine 1:100,000 units of Epinephrine was used to inject the inferior turbinates in the nasal septum. A total of 33ml ml was used. Examination of the nose showed a severe right nasal septal deformity and tremendous hypertrophied inferior turbinate.  Beginning on the right hand side a hemitransfixion incision was then created on the leading edge of the septum on the right.  A subperichondrial plane was elevated posteriorly on the left and taken back to the perpendicular plate of the ethmoid where subperiosteal plane was elevated posteriorly on the left. A large septal spur was identified on the right hand side impacting on the inferior turbinate.  An inferior rim of cartilage was removed anteriorly with care taken to leave an anterior strut to prevent nasal collapse. With this strut removed the perpendicular plate of the ethmoid was separated from the quadrangular cartilage. The large septal spur was removed.  The septum was then replaced in the midline. Reinspection through each nostril showed excellent reduction of the septal deformity. A left posterior inferior fenestration was then created to allow hematoma  drainage.  With the septoplasty completed, beginning on the left-hand side, a 15 blade was used to incise along the inferior edge of the inferior turbinate. A superior laterally based flap was then elevated. The underlying conchal bone of mucosa was excised using Knight scissors. The flap was then laid back over the turbinate stump and cauterized using suction cautery. In a similar fashion the submucous resection was performed on the right.  With the submucous resection completed bilaterally and no active bleeding, the hemitransfixion incision was then closed using two interrupted 3-0 chromic sutures.  Plastic nasal septal splints were placed within each nostril and affixed to the septum using a 3-0 nylon suture. Stammberger was then used beneath each inferior turbinate for hemostasis.    The patient tolerated the procedure well, was returned to anesthesia, extubated in the operating room, and taken to the recovery room in stable condition.    CULTURES:  None.  SPECIMENS:  None.  ESTIMATED BLOOD LOSS:  25 cc.  Roena Malady  10/23/2019  12:30 PM

## 2019-10-23 NOTE — Transfer of Care (Signed)
Immediate Anesthesia Transfer of Care Note  Patient: Sergio Callahan  Procedure(s) Performed: NASAL SEPTOPLASTY WITH SUBMUCOUS RESECTION OF TURBINATE (Bilateral Nose)  Patient Location: PACU  Anesthesia Type: General ETT  Level of Consciousness: awake, alert  and patient cooperative  Airway and Oxygen Therapy: Patient Spontanous Breathing and Patient connected to supplemental oxygen  Post-op Assessment: Post-op Vital signs reviewed, Patient's Cardiovascular Status Stable, Respiratory Function Stable, Patent Airway and No signs of Nausea or vomiting  Post-op Vital Signs: Reviewed and stable  Complications: No apparent anesthesia complications

## 2019-10-23 NOTE — Anesthesia Procedure Notes (Signed)
Procedure Name: Intubation Date/Time: 10/23/2019 11:58 AM Performed by: Georga Bora, CRNA Pre-anesthesia Checklist: Patient identified, Emergency Drugs available, Suction available, Patient being monitored and Timeout performed Patient Re-evaluated:Patient Re-evaluated prior to induction Oxygen Delivery Method: Circle system utilized Preoxygenation: Pre-oxygenation with 100% oxygen Induction Type: IV induction Ventilation: Mask ventilation without difficulty and Oral airway inserted - appropriate to patient size Laryngoscope Size: Mac and 4 Grade View: Grade II Tube type: Oral Rae Tube size: 7.5 mm Number of attempts: 1 Airway Equipment and Method: Bougie stylet and Oral airway Placement Confirmation: ETT inserted through vocal cords under direct vision,  positive ETCO2 and breath sounds checked- equal and bilateral Tube secured with: Tape Dental Injury: Teeth and Oropharynx as per pre-operative assessment and Injury to lip  Comments: Anterior. 2 attempts by CRNA, first attempt by Dr Junious Dresser successful with bougie.

## 2019-10-23 NOTE — H&P (Signed)
The patient's history has been reviewed, patient examined, no change in status, stable for surgery.  Questions were answered to the patients satisfaction.  

## 2019-10-23 NOTE — Anesthesia Preprocedure Evaluation (Signed)
Anesthesia Evaluation  Patient identified by MRN, date of birth, ID band Patient awake    Reviewed: Allergy & Precautions, H&P , NPO status , Patient's Chart, lab work & pertinent test results  Airway Mallampati: III  TM Distance: >3 FB Neck ROM: full    Dental no notable dental hx.    Pulmonary former smoker,    Pulmonary exam normal breath sounds clear to auscultation       Cardiovascular hypertension, + CAD, + Cardiac Stents and + Peripheral Vascular Disease  Normal cardiovascular exam Rhythm:regular Rate:Normal     Neuro/Psych    GI/Hepatic GERD  ,  Endo/Other    Renal/GU      Musculoskeletal   Abdominal   Peds  Hematology   Anesthesia Other Findings   Reproductive/Obstetrics                             Anesthesia Physical Anesthesia Plan  ASA: III  Anesthesia Plan: General ETT   Post-op Pain Management:    Induction:   PONV Risk Score and Plan: 2 and Midazolam, Ondansetron, Dexamethasone and Scopolamine patch - Pre-op  Airway Management Planned:   Additional Equipment:   Intra-op Plan:   Post-operative Plan:   Informed Consent: I have reviewed the patients History and Physical, chart, labs and discussed the procedure including the risks, benefits and alternatives for the proposed anesthesia with the patient or authorized representative who has indicated his/her understanding and acceptance.       Plan Discussed with: CRNA  Anesthesia Plan Comments:         Anesthesia Quick Evaluation

## 2019-10-23 NOTE — Anesthesia Postprocedure Evaluation (Signed)
Anesthesia Post Note  Patient: Sergio Callahan  Procedure(s) Performed: NASAL SEPTOPLASTY WITH SUBMUCOUS RESECTION OF TURBINATE (Bilateral Nose)     Patient location during evaluation: PACU Anesthesia Type: General Level of consciousness: awake and alert and oriented Pain management: satisfactory to patient Vital Signs Assessment: post-procedure vital signs reviewed and stable Respiratory status: spontaneous breathing, nonlabored ventilation and respiratory function stable Cardiovascular status: blood pressure returned to baseline and stable Postop Assessment: Adequate PO intake and No signs of nausea or vomiting Anesthetic complications: no    [name]

## 2019-10-23 NOTE — Discharge Instructions (Signed)
Scopolamine skin patches What is this medicine? SCOPOLAMINE (skoe POL a meen) is used to prevent nausea and vomiting caused by motion sickness, anesthesia and surgery. This medicine may be used for other purposes; ask your health care provider or pharmacist if you have questions. COMMON BRAND NAME(S): Transderm Scop What should I tell my health care provider before I take this medicine? They need to know if you have any of these conditions:  are scheduled to have a gastric secretion test  glaucoma  heart disease  kidney disease  liver disease  lung or breathing disease, like asthma  mental illness  prostate disease  seizures  stomach or intestine problems  trouble passing urine  an unusual or allergic reaction to scopolamine, atropine, other medicines, foods, dyes, or preservatives  pregnant or trying to get pregnant  breast-feeding How should I use this medicine? This medicine is for external use only. Follow the directions on the prescription label. Wear only 1 patch at a time. Choose an area behind the ear, that is clean, dry, hairless and free from any cuts or irritation. Wipe the area with a clean dry tissue. Peel off the plastic backing of the skin patch, trying not to touch the adhesive side with your hands. Do not cut the patches. Firmly apply to the area you have chosen, with the metallic side of the patch to the skin and the tan-colored side showing. Once firmly in place, wash your hands well with soap and water. Do not get this medicine into your eyes. After removing the patch, wash your hands and the area behind your ear thoroughly with soap and water. The patch will still contain some medicine after use. To avoid accidental contact or ingestion by children or pets, fold the used patch in half with the sticky side together and throw away in the trash out of the reach of children and pets. If you need to use a second patch after you remove the first, place it behind  the other ear. A special MedGuide will be given to you by the pharmacist with each prescription and refill. Be sure to read this information carefully each time. Talk to your pediatrician regarding the use of this medicine in children. Special care may be needed. Overdosage: If you think you have taken too much of this medicine contact a poison control center or emergency room at once. NOTE: This medicine is only for you. Do not share this medicine with others. What if I miss a dose? This does not apply. This medicine is not for regular use. What may interact with this medicine?  alcohol  antihistamines for allergy cough and cold  atropine  certain medicines for anxiety or sleep  certain medicines for bladder problems like oxybutynin, tolterodine  certain medicines for depression like amitriptyline, fluoxetine, sertraline  certain medicines for stomach problems like dicyclomine, hyoscyamine  certain medicines for Parkinson's disease like benztropine, trihexyphenidyl  certain medicines for seizures like phenobarbital, primidone  general anesthetics like halothane, isoflurane, methoxyflurane, propofol  ipratropium  local anesthetics like lidocaine, pramoxine, tetracaine  medicines that relax muscles for surgery  phenothiazines like chlorpromazine, mesoridazine, prochlorperazine, thioridazine  narcotic medicines for pain  other belladonna alkaloids This list may not describe all possible interactions. Give your health care provider a list of all the medicines, herbs, non-prescription drugs, or dietary supplements you use. Also tell them if you smoke, drink alcohol, or use illegal drugs. Some items may interact with your medicine. What should I watch for while using  this medicine? Limit contact with water while swimming and bathing because the patch may fall off. If the patch falls off, throw it away and put a new one behind the other ear. You may get drowsy or dizzy. Do not  drive, use machinery, or do anything that needs mental alertness until you know how this medicine affects you. Do not stand or sit up quickly, especially if you are an older patient. This reduces the risk of dizzy or fainting spells. Alcohol may interfere with the effect of this medicine. Avoid alcoholic drinks. Your mouth may get dry. Chewing sugarless gum or sucking hard candy, and drinking plenty of water may help. Contact your healthcare professional if the problem does not go away or is severe. This medicine may cause dry eyes and blurred vision. If you wear contact lenses, you may feel some discomfort. Lubricating drops may help. See your healthcare professional if the problem does not go away or is severe. If you are going to need surgery, an MRI, CT scan, or other procedure, tell your healthcare professional that you are using this medicine. You may need to remove the patch before the procedure. What side effects may I notice from receiving this medicine? Side effects that you should report to your doctor or health care professional as soon as possible:  allergic reactions like skin rash, itching or hives; swelling of the face, lips, or tongue  blurred vision  changes in vision  confusion  dizziness  eye pain  fast, irregular heartbeat  hallucinations, loss of contact with reality  nausea, vomiting  pain or trouble passing urine  restlessness  seizures  skin irritation  stomach pain Side effects that usually do not require medical attention (report to your doctor or health care professional if they continue or are bothersome):  drowsiness  dry mouth  headache  sore throat This list may not describe all possible side effects. Call your doctor for medical advice about side effects. You may report side effects to FDA at 1-800-FDA-1088. Where should I keep my medicine? Keep out of the reach of children. Store at room temperature between 20 and 25 degrees C (68 and 77  degrees F). Keep this medicine in the foil package until ready to use. Throw away any unused medicine after the expiration date. NOTE: This sheet is a summary. It may not cover all possible information. If you have questions about this medicine, talk to your doctor, pharmacist, or health care provider.  2020 Elsevier/Gold Standard (2018-01-17 16:14:46) [place] [facility]  What is Functional Endoscopic Sinus Surgery?  The Surgery involves making the natural openings of the sinuses larger by removing the bony partitions that separate the sinuses from the nasal cavity.  The natural sinus lining is preserved as much as possible to allow the sinuses to resume normal function after the surgery.  In some patients nasal polyps (excessively swollen lining of the sinuses) may be removed to relieve obstruction of the sinus openings.  The surgery is performed through the nose using lighted scopes, which eliminates the need for incisions on the face.  A septoplasty is a different procedure which is sometimes performed with sinus surgery.  It involves straightening the boy partition that separates the two sides of your nose.  A crooked or deviated septum may need repair if is obstructing the sinuses or nasal airflow.  Turbinate reduction is also often performed during sinus surgery.  The  turbinates are bony proturberances from the side walls of the nose which swell and can obstruct the nose in patients with sinus and allergy problems.  Their size can be surgically reduced to help relieve nasal obstruction.  What Can Sinus Surgery Do For Me?  Sinus surgery can reduce the frequency of sinus infections requiring antibiotic treatment.  This can provide improvement in nasal congestion, post-nasal drainage, facial pressure and nasal obstruction.  Surgery will NOT prevent you from ever having an infection again, so it usually  only for patients who get infections 4 or more times yearly requiring antibiotics, or for infections that do not clear with antibiotics.  It will not cure nasal allergies, so patients with allergies may still require medication to treat their allergies after surgery. Surgery may improve headaches related to sinusitis, however, some people will continue to require medication to control sinus headaches related to allergies.  Surgery will do nothing for other forms of headache (migraine, tension or cluster).  What Are the Risks of Endoscopic Sinus Surgery?  Current techniques allow surgery to be performed safely with little risk, however, there are rare complications that patients should be aware of.  Because the sinuses are located around the eyes, there is risk of eye injury, including blindness, though again, this would be quite rare. This is usually a result of bleeding behind the eye during surgery, which puts the vision oat risk, though there are treatments to protect the vision and prevent permanent disrupted by surgery causing a leak of the spinal fluid that surrounds the brain.  More serious complications would include bleeding inside the brain cavity or damage to the brain.  Again, all of these complications are uncommon, and spinal fluid leaks can be safely managed surgically if they occur.  The most common complication of sinus surgery is bleeding from the nose, which may require packing or cauterization of the nose.  Continued sinus have polyps may experience recurrence of the polyps requiring revision surgery.  Alterations of sense of smell or injury to the tear ducts are also rare complications.   What is the Surgery Like, and what is the Recovery?  The Surgery usually takes a couple of hours to perform, and is usually performed under a general anesthetic (completely asleep).  Patients are usually discharged home after a couple of hours.  Sometimes during surgery it is necessary to pack the nose to  control bleeding, and the packing is left in place for 24 - 48 hours, and removed by your surgeon.  If a septoplasty was performed during the procedure, there is often a splint placed which must be removed after 5-7 days.   Discomfort: Pain is usually mild to moderate, and can be controlled by prescription pain medication or acetaminophen (Tylenol).  Aspirin, Ibuprofen (Advil, Motrin), or Naprosyn (Aleve) should be avoided, as they can cause increased bleeding.  Most patients feel sinus pressure like they have a bad head cold for several days.  Sleeping with your head elevated can help reduce swelling and facial pressure, as can ice packs over the face.  A humidifier may be helpful to keep the mucous and blood from drying in the nose.   Diet: There are no specific diet restrictions, however, you should generally start with clear liquids and a light diet of bland foods because the anesthetic can cause some nausea.  Advance your diet depending on how your stomach feels.  Taking your pain medication with food will often help reduce stomach upset which pain medications  can cause.  Nasal Saline Irrigation: It is important to remove blood clots and dried mucous from the nose as it is healing.  This is done by having you irrigate the nose at least 3 - 4 times daily with a salt water solution.  We recommend using NeilMed Sinus Rinse (available at the drug store).  Fill the squeeze bottle with the solution, bend over a sink, and insert the tip of the squeeze bottle into the nose  of an inch.  Point the tip of the squeeze bottle towards the inside corner of the eye on the same side your irrigating.  Squeeze the bottle and gently irrigate the nose.  If you bend forward as you do this, most of the fluid will flow back out of the nose, instead of down your throat.   The solution should be warm, near body temperature, when you irrigate.   Each time you irrigate, you should use a full squeeze bottle.   Note that if you are  instructed to use Nasal Steroid Sprays at any time after your surgery, irrigate with saline BEFORE using the steroid spray, so you do not wash it all out of the nose. Another product, Nasal Saline Gel (such as AYR Nasal Saline Gel) can be applied in each nostril 3 - 4 times daily to moisture the nose and reduce scabbing or crusting.  Bleeding:  Bloody drainage from the nose can be expected for several days, and patients are instructed to irrigate their nose frequently with salt water to help remove mucous and blood clots.  The drainage may be dark red or brown, though some fresh blood may be seen intermittently, especially after irrigation.  Do not blow you nose, as bleeding may occur. If you must sneeze, keep your mouth open to allow air to escape through your mouth.  If heavy bleeding occurs: Irrigate the nose with saline to rinse out clots, then spray the nose 3 - 4 times with Afrin Nasal Decongestant Spray.  The spray will constrict the blood vessels to slow bleeding.  Pinch the lower half of your nose shut to apply pressure, and lay down with your head elevated.  Ice packs over the nose may help as well. If bleeding persists despite these measures, you should notify your doctor.  Do not use the Afrin routinely to control nasal congestion after surgery, as it can result in worsening congestion and may affect healing.     Activity: Return to work varies among patients. Most patients will be out of work at least 5 - 7 days to recover.  Patient may return to work after they are off of narcotic pain medication, and feeling well enough to perform the functions of their job.  Patients must avoid heavy lifting (over 10 pounds) or strenuous physical for 2 weeks after surgery, so your employer may need to assign you to light duty, or keep you out of work longer if light duty is not possible.  NOTE: you should not drive, operate dangerous machinery, do any mentally demanding tasks or make any important legal or  financial decisions while on narcotic pain medication and recovering from the general anesthetic.    Call Your Doctor Immediately if You Have Any of the Following: 1. Bleeding that you cannot control with the above measures 2. Loss of vision, double vision, bulging of the eye or black eyes. 3. Fever over 101 degrees 4. Neck stiffness with severe headache, fever, nausea and change in mental state. You are always  encourage to call anytime with concerns, however, please call with requests for pain medication refills during office hours.  Office Endoscopy: During follow-up visits your doctor will remove any packing or splints that may have been placed and evaluate and clean your sinuses endoscopically.  Topical anesthetic will be used to make this as comfortable as possible, though you may want to take your pain medication prior to the visit.  How often this will need to be done varies from patient to patient.  After complete recovery from the surgery, you may need follow-up endoscopy from time to time, particularly if there is concern of recurrent infection or nasal polyps.    General Anesthesia, Adult, Care After This sheet gives you information about how to care for yourself after your procedure. Your health care provider may also give you more specific instructions. If you have problems or questions, contact your health care provider. What can I expect after the procedure? After the procedure, the following side effects are common:  Pain or discomfort at the IV site.  Nausea.  Vomiting.  Sore throat.  Trouble concentrating.  Feeling cold or chills.  Weak or tired.  Sleepiness and fatigue.  Soreness and body aches. These side effects can affect parts of the body that were not involved in surgery. Follow these instructions at home:  For at least 24 hours after the procedure:  Have a responsible adult stay with you. It is important to have someone help care for you until you are  awake and alert.  Rest as needed.  Do not: ? Participate in activities in which you could fall or become injured. ? Drive. ? Use heavy machinery. ? Drink alcohol. ? Take sleeping pills or medicines that cause drowsiness. ? Make important decisions or sign legal documents. ? Take care of children on your own. Eating and drinking  Follow any instructions from your health care provider about eating or drinking restrictions.  When you feel hungry, start by eating small amounts of foods that are soft and easy to digest (bland), such as toast. Gradually return to your regular diet.  Drink enough fluid to keep your urine pale yellow.  If you vomit, rehydrate by drinking water, juice, or clear broth. General instructions  If you have sleep apnea, surgery and certain medicines can increase your risk for breathing problems. Follow instructions from your health care provider about wearing your sleep device: ? Anytime you are sleeping, including during daytime naps. ? While taking prescription pain medicines, sleeping medicines, or medicines that make you drowsy.  Return to your normal activities as told by your health care provider. Ask your health care provider what activities are safe for you.  Take over-the-counter and prescription medicines only as told by your health care provider.  If you smoke, do not smoke without supervision.  Keep all follow-up visits as told by your health care provider. This is important. Contact a health care provider if:  You have nausea or vomiting that does not get better with medicine.  You cannot eat or drink without vomiting.  You have pain that does not get better with medicine.  You are unable to pass urine.  You develop a skin rash.  You have a fever.  You have redness around your IV site that gets worse. Get help right away if:  You have difficulty breathing.  You have chest pain.  You have blood in your urine or stool, or you vomit  blood. Summary  After the procedure, it  is common to have a sore throat or nausea. It is also common to feel tired.  Have a responsible adult stay with you for the first 24 hours after general anesthesia. It is important to have someone help care for you until you are awake and alert.  When you feel hungry, start by eating small amounts of foods that are soft and easy to digest (bland), such as toast. Gradually return to your regular diet.  Drink enough fluid to keep your urine pale yellow.  Return to your normal activities as told by your health care provider. Ask your health care provider what activities are safe for you. This information is not intended to replace advice given to you by your health care provider. Make sure you discuss any questions you have with your health care provider. Document Released: 02/04/2001 Document Revised: 11/01/2017 Document Reviewed: 06/14/2017 Elsevier Patient Education  2020 [employer].

## 2019-10-26 ENCOUNTER — Encounter: Payer: Self-pay | Admitting: *Deleted

## 2019-11-19 ENCOUNTER — Encounter (INDEPENDENT_AMBULATORY_CARE_PROVIDER_SITE_OTHER): Payer: Medicare Other

## 2019-11-19 ENCOUNTER — Ambulatory Visit (INDEPENDENT_AMBULATORY_CARE_PROVIDER_SITE_OTHER): Payer: Medicare Other | Admitting: Nurse Practitioner

## 2019-12-14 ENCOUNTER — Other Ambulatory Visit: Payer: Self-pay

## 2019-12-14 ENCOUNTER — Ambulatory Visit (INDEPENDENT_AMBULATORY_CARE_PROVIDER_SITE_OTHER): Payer: Medicare Other | Admitting: Nurse Practitioner

## 2019-12-14 ENCOUNTER — Encounter (INDEPENDENT_AMBULATORY_CARE_PROVIDER_SITE_OTHER): Payer: Self-pay | Admitting: Nurse Practitioner

## 2019-12-14 ENCOUNTER — Ambulatory Visit (INDEPENDENT_AMBULATORY_CARE_PROVIDER_SITE_OTHER): Payer: Medicare Other

## 2019-12-14 VITALS — BP 171/96 | HR 89 | Resp 15 | Wt 192.4 lb

## 2019-12-14 DIAGNOSIS — I714 Abdominal aortic aneurysm, without rupture, unspecified: Secondary | ICD-10-CM

## 2019-12-14 DIAGNOSIS — I723 Aneurysm of iliac artery: Secondary | ICD-10-CM | POA: Diagnosis not present

## 2019-12-14 DIAGNOSIS — I70211 Atherosclerosis of native arteries of extremities with intermittent claudication, right leg: Secondary | ICD-10-CM

## 2019-12-14 DIAGNOSIS — I10 Essential (primary) hypertension: Secondary | ICD-10-CM | POA: Diagnosis not present

## 2019-12-14 NOTE — Progress Notes (Signed)
SUBJECTIVE:  Patient ID: Sergio Callahan, male    DOB: 1951-05-17, 69 y.o.   MRN: [id number] Chief Complaint  Patient presents with  . Follow-up    ultrasound follow up    HPI  Sergio Callahan is a 69 y.o. male The patient returns to the office for followup and review of the noninvasive studies.  Patient had bilateral common iliac stents placed in 2019.  There have been no interval changes in lower extremity symptoms. No interval shortening of the patient's claudication distance or development of rest pain symptoms. No new ulcers or wounds have occurred since the last visit.  There have been no significant changes to the patient's overall health care.  The patient denies amaurosis fugax or recent TIA symptoms. There are no recent neurological changes noted. The patient denies history of DVT, PE or superficial thrombophlebitis. The patient denies recent episodes of angina or shortness of breath.   ABI Rt=1.23 and Lt=1.19  (previous ABI's Rt=1.14 and Lt=1.11) Duplex ultrasound of the bilateral tibial arteries revealed triphasic waveforms with strong toe waveforms bilaterally.  The patient also underwent an iliac duplex.  The abdominal aortic aneurysm measured 1.6 cm at largest.  The right common iliac artery measures 0.9 cm and the left common iliac artery measures 0.8 cm at the renal arteries.  Previously placed stents are patent. Past Medical History:  Diagnosis Date  . Anemia    takes B12 injections monthly  . Anxiety    takes Xanax daily as needed  . Arthritis    back   . Back pain    synovial cyst  . Collagen vascular disease (HCC)    Hx Lupus.  . Coronary artery disease   . Enlarged prostate    takes Flomax daily-slightly  . GERD (gastroesophageal reflux disease)    takes Omeprazole daily  . History of colon polyps    benign  . History of shingles   . Hyperlipidemia    takes Pravastatin nightly  . Hypertension    takes Lisinopril-HCTZ and Metoprolol daily  . Lupus  ([place])    takes Plaquenil and Imran daily  . Nocturia   . Numbness and tingling    left hip/leg/foot  . Peripheral vascular disease ([place])   . Urinary frequency     Past Surgical History:  Procedure Laterality Date  . CARDIAC CATHETERIZATION  2012  . COLONOSCOPY    . COLONOSCOPY WITH PROPOFOL N/A 09/30/2019   Procedure: COLONOSCOPY WITH PROPOFOL;  Surgeon: Robert Bellow, MD;  Location: [facility];  Service: Endoscopy;  Laterality: N/A;  . CORONARY ANGIOPLASTY     1 stent  . ESOPHAGOGASTRODUODENOSCOPY    . LUMBAR LAMINECTOMY/DECOMPRESSION MICRODISCECTOMY Left 02/03/2015   Procedure: LUMBAR LAMINECTOMY/DECOMPRESSION MICRODISCECTOMY LEFT LUMBAR FOUR-FIVE WITH REMOVAL OF SYNOVIAL CYST;  Surgeon: Newman Pies, MD;  Location: [place] [facility];  Service: Neurosurgery;  Laterality: Left;  . NASAL SEPTOPLASTY W/ TURBINOPLASTY Bilateral 10/23/2019   Procedure: NASAL SEPTOPLASTY WITH SUBMUCOUS RESECTION OF TURBINATE;  Surgeon: Beverly Gust, MD;  Location: [place];  Service: ENT;  Laterality: Bilateral;  . PELVIC ANGIOGRAPHY N/A 05/02/2018   Procedure: PELVIC ANGIOGRAPHY;  Surgeon: Katha Cabal, MD;  Location: [place] [facility];  Service: Cardiovascular;  Laterality: N/A;  . SHOULDER ARTHROSCOPY WITH SUBACROMIAL DECOMPRESSION AND BICEP TENDON REPAIR Right 12/04/2018   Procedure: SHOULDER ARTHROSCOPY WITH DEBRIDEMENT, DECOMPRESSION, BICEP TENODESIS;  Surgeon: Corky Mull, MD;  Location: [facility];  Service: Orthopedics;  Laterality: Right;    Social History  Socioeconomic History  . Marital status: Married    Spouse name: Not on file  . Number of children: Not on file  . Years of education: Not on file  . Highest education level: Not on file  Occupational History  . Not on file  Tobacco Use  . Smoking status: Former Smoker    Packs/day: 1.50    Years: 30.00    Pack years: 45.00    Types: Cigarettes    Quit date: 09/12/2019    Years since quitting: 0.2  .  Smokeless tobacco: Never Used  Substance and Sexual Activity  . Alcohol use: Yes    Comment: cocktails about 2 daily  . Drug use: No  . Sexual activity: Yes  Other Topics Concern  . Not on file  Social History Narrative  . Not on file   Social Determinants of Health   Financial Resource Strain:   . Difficulty of Paying Living Expenses: Not on file  Food Insecurity:   . Worried About Charity fundraiser in the Last Year: Not on file  . Ran Out of Food in the Last Year: Not on file  Transportation Needs:   . Lack of Transportation (Medical): Not on file  . Lack of Transportation (Non-Medical): Not on file  Physical Activity:   . Days of Exercise per Week: Not on file  . Minutes of Exercise per Session: Not on file  Stress:   . Feeling of Stress : Not on file  Social Connections:   . Frequency of Communication with Friends and Family: Not on file  . Frequency of Social Gatherings with Friends and Family: Not on file  . Attends Religious Services: Not on file  . Active Member of Clubs or Organizations: Not on file  . Attends Archivist Meetings: Not on file  . Marital Status: Not on file  Intimate Partner Violence:   . Fear of Current or Ex-Partner: Not on file  . Emotionally Abused: Not on file  . Physically Abused: Not on file  . Sexually Abused: Not on file    Family History  Problem Relation Age of Onset  . Hyperlipidemia Father   . Hypertension Father   . Diabetes Father   . Colon cancer Father   . Hearing loss Father   . Vascular Disease Father     Allergies  Allergen Reactions  . Fosamax [Alendronate Sodium]     Leg pain  . Percocet [Oxycodone-Acetaminophen] Nausea And Vomiting     Review of Systems   Review of Systems: Negative Unless Checked Constitutional: [] Weight loss  [] Fever  [] Chills Cardiac: [] Chest pain   []  Atrial Fibrillation  [] Palpitations   [] Shortness of breath when laying flat   [] Shortness of breath with exertion.  [] Shortness of breath at rest Vascular:  [] Pain in legs with walking   [] Pain in legs with standing [] Pain in legs when laying flat   [x] Claudication    [] Pain in feet when laying flat    [] History of DVT   [] Phlebitis   [] Swelling in legs   [] Varicose veins   [] Non-healing ulcers Pulmonary:   [] Uses home oxygen   [] Productive cough   [] Hemoptysis   [] Wheeze  [] COPD   [] Asthma Neurologic:  [] Dizziness   [] Seizures  [] Blackouts [] History of stroke   [] History of TIA  [] Aphasia   [] Temporary Blindness   [] Weakness or numbness in arm   [] Weakness or numbness in leg Musculoskeletal:   [] Joint swelling   [] Joint pain   [  x]Low back pain  []  History of Knee Replacement [x] Arthritis [] back Surgeries  []  Spinal Stenosis    Hematologic:  [] Easy bruising  [] Easy bleeding   [] Hypercoagulable state   [] Anemic Gastrointestinal:  [] Diarrhea   [] Vomiting  [] Gastroesophageal reflux/heartburn   [] Difficulty swallowing. [] Abdominal pain Genitourinary:  [] Chronic kidney disease   [] Difficult urination  [] Anuric   [] Blood in urine [] Frequent urination  [] Burning with urination   [] Hematuria Skin:  [] Rashes   [] Ulcers [] Wounds Psychological:  [] History of anxiety   []  History of major depression  []  Memory Difficulties      OBJECTIVE:   Physical Exam  BP (!) 171/96 (BP Location: Right Arm)   Pulse 89   Resp 15   Wt 192 lb 6.4 oz (87.3 kg)   BMI 26.09 kg/m   Gen: WD/WN, NAD Head: [place]/AT, No temporalis wasting.  Ear/Nose/Throat: Hearing grossly intact, nares w/o erythema or drainage Eyes: PER, EOMI, sclera nonicteric.  Neck: Supple, no masses.  No JVD.  Pulmonary:  Good air movement, no use of accessory muscles.  Cardiac: RRR Vascular:  Vessel Right Left  Radial Palpable Palpable  Popliteal  not palpable  not palpable  Dorsalis Pedis Palpable Palpable  Posterior Tibial Palpable Palpable   Gastrointestinal: soft, non-distended. No guarding/no peritoneal signs.  Musculoskeletal: M/S 5/5 throughout.  No  deformity or atrophy.  Neurologic: Pain and light touch intact in extremities.  Symmetrical.  Speech is fluent. Motor exam as listed above. Psychiatric: Judgment intact, Mood & affect appropriate for pt's clinical situation. Dermatologic: No Venous rashes. No Ulcers Noted.  No changes consistent with cellulitis. Lymph : No Cervical lymphadenopathy, no lichenification or skin changes of chronic lymphedema.       ASSESSMENT AND PLAN:  1. Atherosclerosis of native artery of right lower extremity with intermittent claudication (HCC)  Recommend:  The patient has evidence of atherosclerosis of the lower extremities with claudication.  The patient does not voice lifestyle limiting changes at this point in time.  Noninvasive studies do not suggest clinically significant change.  No invasive studies, angiography or surgery at this time The patient should continue walking and begin a more formal exercise program.  The patient should continue antiplatelet therapy and aggressive treatment of the lipid abnormalities  No changes in the patient's medications at this time  The patient should continue wearing graduated compression socks 10-15 mmHg strength to control the mild edema.   - VAS [place] ABI WITH/WO TBI; Future - VAS US AORTA/IVC/ILIACS; Future  2. AAA (abdominal aortic aneurysm) without rupture (HCC) Previously the largest diameter was 2.63 cm at the proximal portion of the abdominal aortic aorta however today the largest measurement was at 1.6 cm in the midportion.  In either case this is less than 3 cm.  We will continue to monitor on an annual basis. - VAS US AORTA/IVC/ILIACS; Future  3. Iliac artery aneurysm (HCC) There is a slight difference between last year and this year study.  The right common iliac had a maximal diameter of 1.3 cm however today it is 0.9 cm.  We will repeat studies next year to continue to evaluate possible iliac artery aneurysms. - VAS US AORTA/IVC/ILIACS;  Future  4. Essential hypertension Continue antihypertensive medications as already ordered, these medications have been reviewed and there are no changes at this time.    Current Outpatient Medications on File Prior to Visit  Medication Sig Dispense Refill  . ALPRAZolam (XANAX) 0.5 MG tablet Take 0.5 mg by mouth 3 (three) times daily as  needed for anxiety.     Marland Kitchen aspirin EC 81 MG tablet Take 81 mg by mouth daily.    Marland Kitchen azaTHIOprine (IMURAN) 50 MG tablet Take 125 mg by mouth daily.     . cetirizine (ZYRTEC) 10 MG tablet Take 10 mg by mouth daily.    . cyanocobalamin (,VITAMIN B-12,) 1000 MCG/ML injection Inject 1,000 mcg into the muscle every 14 (fourteen) days.     Marland Kitchen desonide (VERDESO) 0.05 % foam Apply 1 application topically 2 (two) times daily as needed (rash).    Marland Kitchen ELDERBERRY PO Take by mouth daily.    . finasteride (PROSCAR) 5 MG tablet Take 5 mg by mouth daily.    . hydrochlorothiazide (HYDRODIURIL) 25 MG tablet Take 25 mg by mouth daily.    . hydroxychloroquine (PLAQUENIL) 200 MG tablet Take 200 mg by mouth 2 (two) times daily.    Marland Kitchen ibuprofen (ADVIL,MOTRIN) 200 MG tablet Take 600 mg by mouth as needed for headache or moderate pain.     Marland Kitchen imiquimod (ALDARA) 5 % cream Apply topically 3 (three) times a week. At bedtime    . lisinopril (ZESTRIL) 40 MG tablet Take 40 mg by mouth daily.    . metoprolol succinate (TOPROL-XL) 100 MG 24 hr tablet Take 100 mg by mouth at bedtime. Take with or immediately following a meal.    . nicotine (NICODERM CQ - DOSED IN MG/24 HOURS) 21 mg/24hr patch Place 21 mg onto the skin daily.    . nitroGLYCERIN (NITROLINGUAL) 0.4 MG/SPRAY spray Place 1 spray under the tongue every 5 (five) minutes x 3 doses as needed for chest pain.    Marland Kitchen omeprazole (PRILOSEC) 20 MG capsule Take 20 mg by mouth daily.     . pravastatin (PRAVACHOL) 40 MG tablet Take 40 mg by mouth at bedtime.    . sildenafil (REVATIO) 20 MG tablet Take 20 mg by mouth daily as needed for erectile  dysfunction.   5  . HYDROcodone-Acetaminophen 5-300 MG TABS Take 1-2 tablets by mouth every 4 (four) hours as needed. (Patient not taking: Reported on 12/14/2019) 40 tablet 0  . ondansetron (ZOFRAN) 4 MG tablet Take 1 tablet (4 mg total) by mouth every 8 (eight) hours as needed for nausea or vomiting. (Patient not taking: Reported on 12/14/2019) 30 tablet 1  . sulfamethoxazole-trimethoprim (BACTRIM DS) 800-160 MG tablet Take 1 tablet by mouth 2 (two) times daily. (Patient not taking: Reported on 12/14/2019) 20 tablet 0  . tamsulosin (FLOMAX) 0.4 MG CAPS capsule Take 0.4 mg by mouth daily after breakfast.     No current facility-administered medications on file prior to visit.    There are no Patient Instructions on file for this visit. No follow-ups on file.   Kris Hartmann, NP  This note was completed with Sales executive.  Any errors are purely unintentional.

## 2020-03-10 ENCOUNTER — Other Ambulatory Visit: Payer: Self-pay | Admitting: Rehabilitative and Restorative Service Providers"

## 2020-03-10 DIAGNOSIS — I739 Peripheral vascular disease, unspecified: Secondary | ICD-10-CM

## 2020-03-10 DIAGNOSIS — I2510 Atherosclerotic heart disease of native coronary artery without angina pectoris: Secondary | ICD-10-CM

## 2020-03-10 DIAGNOSIS — I10 Essential (primary) hypertension: Secondary | ICD-10-CM

## 2020-03-10 DIAGNOSIS — I723 Aneurysm of iliac artery: Secondary | ICD-10-CM

## 2020-03-16 ENCOUNTER — Other Ambulatory Visit: Payer: Self-pay | Admitting: Rehabilitative and Restorative Service Providers"

## 2020-03-16 DIAGNOSIS — I701 Atherosclerosis of renal artery: Secondary | ICD-10-CM

## 2020-03-16 DIAGNOSIS — I10 Essential (primary) hypertension: Secondary | ICD-10-CM

## 2020-12-12 DIAGNOSIS — E871 Hypo-osmolality and hyponatremia: Secondary | ICD-10-CM | POA: Insufficient documentation

## 2020-12-13 ENCOUNTER — Ambulatory Visit (INDEPENDENT_AMBULATORY_CARE_PROVIDER_SITE_OTHER): Payer: Medicare Other

## 2020-12-13 ENCOUNTER — Encounter (INDEPENDENT_AMBULATORY_CARE_PROVIDER_SITE_OTHER): Payer: Self-pay | Admitting: Vascular Surgery

## 2020-12-13 ENCOUNTER — Other Ambulatory Visit: Payer: Self-pay

## 2020-12-13 ENCOUNTER — Ambulatory Visit (INDEPENDENT_AMBULATORY_CARE_PROVIDER_SITE_OTHER): Payer: Medicare Other | Admitting: Vascular Surgery

## 2020-12-13 VITALS — BP 159/97 | HR 83 | Resp 16 | Wt 200.0 lb

## 2020-12-13 DIAGNOSIS — E785 Hyperlipidemia, unspecified: Secondary | ICD-10-CM | POA: Diagnosis not present

## 2020-12-13 DIAGNOSIS — I723 Aneurysm of iliac artery: Secondary | ICD-10-CM

## 2020-12-13 DIAGNOSIS — I714 Abdominal aortic aneurysm, without rupture, unspecified: Secondary | ICD-10-CM

## 2020-12-13 DIAGNOSIS — I70211 Atherosclerosis of native arteries of extremities with intermittent claudication, right leg: Secondary | ICD-10-CM

## 2020-12-13 DIAGNOSIS — I10 Essential (primary) hypertension: Secondary | ICD-10-CM | POA: Diagnosis not present

## 2020-12-13 NOTE — Assessment & Plan Note (Signed)
blood pressure control important in reducing the progression of atherosclerotic disease. On appropriate oral medications.  

## 2020-12-13 NOTE — Progress Notes (Signed)
MRN : [id number]  Sergio Callahan is a 70 y.o. (06/11/1951) male who presents with chief complaint of  Chief Complaint  Patient presents with  . Follow-up    Ultrasound follow up  .  History of Present Illness: Patient returns today in follow up of his PAD.  He is about 3 to 4 years status post iliac intervention for PAD.  Since that time, he has no disabling claudication, rest pain, or ulceration.  Noninvasive studies today are normal.  ABIs are 1.19 on the right and 1.13 on the left with triphasic waveforms and normal digital pressures.  His aorta measures 2.8 cm in maximal diameter so it is mildly ectatic but not frankly aneurysmal.  Current Outpatient Medications  Medication Sig Dispense Refill  . ALPRAZolam (XANAX) 0.5 MG tablet Take 0.5 mg by mouth 3 (three) times daily as needed for anxiety.     Marland Kitchen. aspirin EC 81 MG tablet Take 81 mg by mouth daily.    Marland Kitchen. azaTHIOprine (IMURAN) 50 MG tablet Take 125 mg by mouth daily.     . cetirizine (ZYRTEC) 10 MG tablet Take 10 mg by mouth daily.    . cyanocobalamin (,VITAMIN B-12,) 1000 MCG/ML injection Inject 1,000 mcg into the muscle every 14 (fourteen) days.     Marland Kitchen. desonide (VERDESO) 0.05 % foam Apply 1 application topically 2 (two) times daily as needed (rash).    Marland Kitchen. ELDERBERRY PO Take by mouth daily.    . finasteride (PROSCAR) 5 MG tablet Take 5 mg by mouth daily.    . hydrochlorothiazide (HYDRODIURIL) 25 MG tablet Take 25 mg by mouth daily.    . hydroxychloroquine (PLAQUENIL) 200 MG tablet Take 200 mg by mouth 2 (two) times daily.    Marland Kitchen. ibuprofen (ADVIL,MOTRIN) 200 MG tablet Take 600 mg by mouth as needed for headache or moderate pain.     Marland Kitchen. imiquimod (ALDARA) 5 % cream Apply topically 3 (three) times a week. At bedtime    . lisinopril (ZESTRIL) 40 MG tablet Take 40 mg by mouth daily.    . metoprolol succinate (TOPROL-XL) 100 MG 24 hr tablet Take 100 mg by mouth at bedtime. Take with or immediately following a meal.    . nicotine (NICODERM CQ  - DOSED IN MG/24 HOURS) 21 mg/24hr patch Place 21 mg onto the skin daily.    . nitroGLYCERIN (NITROLINGUAL) 0.4 MG/SPRAY spray Place 1 spray under the tongue every 5 (five) minutes x 3 doses as needed for chest pain.    Marland Kitchen. omeprazole (PRILOSEC) 20 MG capsule Take 20 mg by mouth daily.     . pravastatin (PRAVACHOL) 40 MG tablet Take 40 mg by mouth at bedtime.    . sildenafil (REVATIO) 20 MG tablet Take 20 mg by mouth daily as needed for erectile dysfunction.   5  . spironolactone (ALDACTONE) 25 MG tablet Take 25 mg by mouth daily.    . tamsulosin (FLOMAX) 0.4 MG CAPS capsule Take 0.4 mg by mouth daily after breakfast.    . HYDROcodone-Acetaminophen 5-300 MG TABS Take 1-2 tablets by mouth every 4 (four) hours as needed. (Patient not taking: No sig reported) 40 tablet 0  . sulfamethoxazole-trimethoprim (BACTRIM DS) 800-160 MG tablet Take 1 tablet by mouth 2 (two) times daily. (Patient not taking: No sig reported) 20 tablet 0   No current facility-administered medications for this visit.    Past Medical History:  Diagnosis Date  . Anemia    takes B12 injections monthly  . Anxiety  takes Xanax daily as needed  . Arthritis    back   . Back pain    synovial cyst  . Collagen vascular disease (HCC)    Hx Lupus.  . Coronary artery disease   . Enlarged prostate    takes Flomax daily-slightly  . GERD (gastroesophageal reflux disease)    takes Omeprazole daily  . History of colon polyps    benign  . History of shingles   . Hyperlipidemia    takes Pravastatin nightly  . Hypertension    takes Lisinopril-HCTZ and Metoprolol daily  . Lupus ([place])    takes Plaquenil and Imran daily  . Nocturia   . Numbness and tingling    left hip/leg/foot  . Peripheral vascular disease ([place])   . Urinary frequency     Past Surgical History:  Procedure Laterality Date  . CARDIAC CATHETERIZATION  2012  . COLONOSCOPY    . COLONOSCOPY WITH PROPOFOL N/A 09/30/2019   Procedure: COLONOSCOPY WITH PROPOFOL;   Surgeon: Robert Bellow, MD;  Location: [facility];  Service: Endoscopy;  Laterality: N/A;  . CORONARY ANGIOPLASTY     1 stent  . ESOPHAGOGASTRODUODENOSCOPY    . LUMBAR LAMINECTOMY/DECOMPRESSION MICRODISCECTOMY Left 02/03/2015   Procedure: LUMBAR LAMINECTOMY/DECOMPRESSION MICRODISCECTOMY LEFT LUMBAR FOUR-FIVE WITH REMOVAL OF SYNOVIAL CYST;  Surgeon: Newman Pies, MD;  Location: [place] [facility];  Service: Neurosurgery;  Laterality: Left;  . NASAL SEPTOPLASTY W/ TURBINOPLASTY Bilateral 10/23/2019   Procedure: NASAL SEPTOPLASTY WITH SUBMUCOUS RESECTION OF TURBINATE;  Surgeon: Beverly Gust, MD;  Location: [place];  Service: ENT;  Laterality: Bilateral;  . PELVIC ANGIOGRAPHY N/A 05/02/2018   Procedure: PELVIC ANGIOGRAPHY;  Surgeon: Katha Cabal, MD;  Location: [place] [facility];  Service: Cardiovascular;  Laterality: N/A;  . SHOULDER ARTHROSCOPY WITH SUBACROMIAL DECOMPRESSION AND BICEP TENDON REPAIR Right 12/04/2018   Procedure: SHOULDER ARTHROSCOPY WITH DEBRIDEMENT, DECOMPRESSION, BICEP TENODESIS;  Surgeon: Corky Mull, MD;  Location: [facility];  Service: Orthopedics;  Laterality: Right;     Social History   Tobacco Use  . Smoking status: Former Smoker    Packs/day: 1.50    Years: 30.00    Pack years: 45.00    Types: Cigarettes    Quit date: 09/12/2019    Years since quitting: 1.2  . Smokeless tobacco: Never Used  Vaping Use  . Vaping Use: Never used  Substance Use Topics  . Alcohol use: Yes    Comment: cocktails about 2 daily  . Drug use: No       Family History  Problem Relation Age of Onset  . Hyperlipidemia Father   . Hypertension Father   . Diabetes Father   . Colon cancer Father   . Hearing loss Father   . Vascular Disease Father     Allergies  Allergen Reactions  . Fosamax [Alendronate Sodium]     Leg pain  . Percocet [Oxycodone-Acetaminophen] Nausea And Vomiting    Review of Systems   Review of Systems: Negative Unless  Checked Constitutional: [] ?Weight loss[] ?Fever[] ?Chills Cardiac:[] ?Chest pain[] ? Atrial Fibrillation[] ?Palpitations [] ?Shortness of breath when laying flat [] ?Shortness of breath with exertion. [] ?Shortness of breath at rest Vascular: [] ?Pain in legs with walking[] ?Pain in legswith standing[] ?Pain in legs when laying flat [x] ?Claudication  [] ?Pain in feet when laying flat [] ?History of DVT [] ?Phlebitis [] ?Swelling in legs [] ?Varicose veins [] ?Non-healing ulcers Pulmonary: [] ?Uses home oxygen [] ?Productive cough[] ?Hemoptysis [] ?Wheeze [] ?COPD [] ?Asthma Neurologic: [] ?Dizziness[] ?Seizures [] ?Blackouts[] ?History of stroke [] ?History of TIA[] ?Aphasia [] ?Temporary Blindness[] ?Weaknessor numbness in arm [] ?Weakness or numbnessin leg Musculoskeletal:[] ?Joint swelling [] ?Joint pain [  x]?Low back pain  [] ? History of Knee Replacement [x] ?Arthritis [] ?back Surgeries[] ? Spinal Stenosis  Hematologic:[] ?Easy bruising[] ?Easy bleeding [] ?Hypercoagulable state [] ?Anemic Gastrointestinal:[] ?Diarrhea [] ?Vomiting[] ?Gastroesophageal reflux/heartburn[] ?Difficulty swallowing. [] ?Abdominal pain Genitourinary: [] ?Chronic kidney disease [] ?Difficulturination [] ?Anuric[] ?Blood in urine [] ?Frequenturination [] ?Burning with urination[] ?Hematuria Skin: [] ?Rashes [] ?Ulcers [] ?Wounds Psychological: [] ?History of anxiety[] ?History of major depression  [] ? Memory Difficulties  Physical Examination  BP (!) 159/97 (BP Location: Left Arm)   Pulse 83   Resp 16   Wt 200 lb (90.7 kg)   BMI 27.12 kg/m  Gen:  WD/WN, NAD Head: [place]/AT, No temporalis wasting. Ear/Nose/Throat: Hearing grossly intact, nares w/o erythema or drainage Eyes: Conjunctiva clear. Sclera non-icteric Neck: Supple.  Trachea midline Pulmonary:  Good air movement, no use of accessory muscles.  Cardiac: RRR, no JVD Vascular:  Vessel Right Left   Radial Palpable Palpable                          PT Palpable Palpable  DP Palpable Palpable   Gastrointestinal: soft, non-tender/non-distended. No guarding/reflex.  Musculoskeletal: M/S 5/5 throughout.  No deformity or atrophy.  No edema. Neurologic: Sensation grossly intact in extremities.  Symmetrical.  Speech is fluent.  Psychiatric: Judgment intact, Mood & affect appropriate for pt's clinical situation. Dermatologic: No rashes or ulcers noted.  No cellulitis or open wounds.       Labs No results found for this or any previous visit (from the past 2160 hour(s)).  Radiology No results found.  Assessment/Plan  Essential hypertension blood pressure control important in reducing the progression of atherosclerotic disease. On appropriate oral medications.   Hyperlipidemia lipid control important in reducing the progression of atherosclerotic disease. Continue statin therapy   AAA (abdominal aortic aneurysm) without rupture (HCC) Aorta measured 2.8 cm on studies today so ectatic but not frankly aneurysmal.  Atherosclerosis of native arteries of extremity with intermittent claudication (HCC) Noninvasive studies today are normal.  ABIs are 1.19 on the right and 1.13 on the left with triphasic waveforms and normal digital pressures.  His aorta measures 2.8 cm in maximal diameter so it is mildly ectatic but not frankly aneurysmal.  No change in medical regimen.  Recheck in 1 year.    Leotis Pain, MD  12/13/2020 10:53 AM    This note was created with Dragon medical transcription system.  Any errors from dictation are purely unintentional

## 2020-12-13 NOTE — Assessment & Plan Note (Signed)
lipid control important in reducing the progression of atherosclerotic disease. Continue statin therapy  

## 2020-12-13 NOTE — Assessment & Plan Note (Signed)
Noninvasive studies today are normal.  ABIs are 1.19 on the right and 1.13 on the left with triphasic waveforms and normal digital pressures.  His aorta measures 2.8 cm in maximal diameter so it is mildly ectatic but not frankly aneurysmal.  No change in medical regimen.  Recheck in 1 year.

## 2020-12-13 NOTE — Assessment & Plan Note (Signed)
Aorta measured 2.8 cm on studies today so ectatic but not frankly aneurysmal.

## 2020-12-26 ENCOUNTER — Other Ambulatory Visit: Payer: Self-pay

## 2020-12-26 ENCOUNTER — Ambulatory Visit (INDEPENDENT_AMBULATORY_CARE_PROVIDER_SITE_OTHER): Payer: Medicare Other | Admitting: Urology

## 2020-12-26 ENCOUNTER — Encounter: Payer: Self-pay | Admitting: Urology

## 2020-12-26 VITALS — BP 144/83 | HR 88 | Ht 72.0 in | Wt 201.5 lb

## 2020-12-26 DIAGNOSIS — N401 Enlarged prostate with lower urinary tract symptoms: Secondary | ICD-10-CM

## 2020-12-26 DIAGNOSIS — R3121 Asymptomatic microscopic hematuria: Secondary | ICD-10-CM

## 2020-12-26 DIAGNOSIS — R3129 Other microscopic hematuria: Secondary | ICD-10-CM | POA: Diagnosis not present

## 2020-12-26 DIAGNOSIS — N138 Other obstructive and reflux uropathy: Secondary | ICD-10-CM

## 2020-12-26 LAB — MICROSCOPIC EXAMINATION: WBC, UA: NONE SEEN /hpf (ref 0–5)

## 2020-12-26 LAB — URINALYSIS, COMPLETE
Bilirubin, UA: NEGATIVE
Glucose, UA: NEGATIVE
Ketones, UA: NEGATIVE
Leukocytes,UA: NEGATIVE
Nitrite, UA: NEGATIVE
Protein,UA: NEGATIVE
Specific Gravity, UA: 1.025 (ref 1.005–1.030)
Urobilinogen, Ur: 1 mg/dL (ref 0.2–1.0)
pH, UA: 7 (ref 5.0–7.5)

## 2020-12-26 LAB — BLADDER SCAN AMB NON-IMAGING

## 2020-12-26 NOTE — Patient Instructions (Signed)
Benign Prostatic Hyperplasia  Benign prostatic hyperplasia (BPH) is an enlarged prostate gland that is caused by the normal aging process and not by cancer. The prostate is a walnut-sized gland that is involved in the production of semen. It is located in front of the rectum and below the bladder. The bladder stores urine and the urethra is the tube that carries the urine out of the body. The prostate may get bigger as a man gets older. An enlarged prostate can press on the urethra. This can make it harder to pass urine. The build-up of urine in the bladder can cause infection. Back pressure and infection may progress to bladder damage and kidney (renal) failure. What are the causes? This condition is part of a normal aging process. However, not all men develop problems from this condition. If the prostate enlarges away from the urethra, urine flow will not be blocked. If it enlarges toward the urethra and compresses it, there will be problems passing urine. What increases the risk? This condition is more likely to develop in men over the age of 50 years. What are the signs or symptoms? Symptoms of this condition include:  Getting up often during the night to urinate.  Needing to urinate frequently during the day.  Difficulty starting urine flow.  Decrease in size and strength of your urine stream.  Leaking (dribbling) after urinating.  Inability to pass urine. This needs immediate treatment.  Inability to completely empty your bladder.  Pain when you pass urine. This is more common if there is also an infection.  Urinary tract infection (UTI). How is this diagnosed? This condition is diagnosed based on your medical history, a physical exam, and your symptoms. Tests will also be done, such as:  A post-void bladder scan. This measures any amount of urine that may remain in your bladder after you finish urinating.  A digital rectal exam. In a rectal exam, your health care provider  checks your prostate by putting a lubricated, gloved finger into your rectum to feel the back of your prostate gland. This exam detects the size of your gland and any abnormal lumps or growths.  An exam of your urine (urinalysis).  A prostate specific antigen (PSA) screening. This is a blood test used to screen for prostate cancer.  An ultrasound. This test uses sound waves to electronically produce a picture of your prostate gland. Your health care provider may refer you to a specialist in kidney and prostate diseases (urologist). How is this treated? Once symptoms begin, your health care provider will monitor your condition (active surveillance or watchful waiting). Treatment for this condition will depend on the severity of your condition. Treatment may include:  Observation and yearly exams. This may be the only treatment needed if your condition and symptoms are mild.  Medicines to relieve your symptoms, including: ? Medicines to shrink the prostate. ? Medicines to relax the muscle of the prostate.  Surgery in severe cases. Surgery may include: ? Prostatectomy. In this procedure, the prostate tissue is removed completely through an open incision or with a laparoscope or robotics. ? Transurethral resection of the prostate (TURP). In this procedure, a tool is inserted through the opening at the tip of the penis (urethra). It is used to cut away tissue of the inner core of the prostate. The pieces are removed through the same opening of the penis. This removes the blockage. ? Transurethral incision (TUIP). In this procedure, small cuts are made in the prostate. This lessens   the prostate's pressure on the urethra. ? Transurethral microwave thermotherapy (TUMT). This procedure uses microwaves to create heat. The heat destroys and removes a small amount of prostate tissue. ? Transurethral needle ablation (TUNA). This procedure uses radio frequencies to destroy and remove a small amount of  prostate tissue. ? Interstitial laser coagulation ([place]). This procedure uses a laser to destroy and remove a small amount of prostate tissue. ? Transurethral electrovaporization (TUVP). This procedure uses electrodes to destroy and remove a small amount of prostate tissue. ? Prostatic urethral lift. This procedure inserts an implant to push the lobes of the prostate away from the urethra. Follow these instructions at home:  Take over-the-counter and prescription medicines only as told by your health care provider.  Monitor your symptoms for any changes. Contact your health care provider with any changes.  Avoid drinking large amounts of liquid before going to bed or out in public.  Avoid or reduce how much caffeine or alcohol you drink.  Give yourself time when you urinate.  Keep all follow-up visits as told by your health care provider. This is important. Contact a health care provider if:  You have unexplained back pain.  Your symptoms do not get better with treatment.  You develop side effects from the medicine you are taking.  Your urine becomes very dark or has a bad smell.  Your lower abdomen becomes distended and you have trouble passing your urine. Get help right away if:  You have a fever or chills.  You suddenly cannot urinate.  You feel lightheaded, or very dizzy, or you faint.  There are large amounts of blood or clots in the urine.  Your urinary problems become hard to manage.  You develop moderate to severe low back or flank pain. The flank is the side of your body between the ribs and the hip. These symptoms may represent a serious problem that is an emergency. Do not wait to see if the symptoms will go away. Get medical help right away. Call your local emergency services (911 in the [place] Do not drive yourself to the hospital. Summary  Benign prostatic hyperplasia (BPH) is an enlarged prostate that is caused by the normal aging process and not by  cancer.  An enlarged prostate can press on the urethra. This can make it hard to pass urine.  This condition is part of a normal aging process and is more likely to develop in men over the age of 25 years.  Get help right away if you suddenly cannot urinate. This information is not intended to replace advice given to you by your health care provider. Make sure you discuss any questions you have with your health care provider. Document Revised: 07/07/2020 Document Reviewed: 07/07/2020 Elsevier Patient Education  2021 [place].    Cystoscopy Cystoscopy is a procedure that is used to help diagnose and sometimes treat conditions that affect the lower urinary tract. The lower urinary tract includes the bladder and the urethra. The urethra is the tube that drains urine from the bladder. Cystoscopy is done using a thin, tube-shaped instrument with a light and camera at the end (cystoscope). The cystoscope may be hard or flexible, depending on the goal of the procedure. The cystoscope is inserted through the urethra, into the bladder. Cystoscopy may be recommended if you have:  Urinary tract infections that keep coming back.  Blood in the urine (hematuria).  An inability to control when you urinate (urinary incontinence) or an overactive bladder.  Unusual  cells found in a urine sample.  A blockage in the urethra, such as a urinary stone.  Painful urination.  An abnormality in the bladder found during an intravenous pyelogram (IVP) or CT scan. Cystoscopy may also be done to remove a sample of tissue to be examined under a microscope (biopsy). Tell a health care provider about:  Any allergies you have.  All medicines you are taking, including vitamins, herbs, eye drops, creams, and over-the-counter medicines.  Any problems you or family members have had with anesthetic medicines.  Any blood disorders you have.  Any surgeries you have had.  Any medical conditions you  have.  Whether you are pregnant or may be pregnant. What are the risks? Generally, this is a safe procedure. However, problems may occur, including:  Infection.  Bleeding.  Allergic reactions to medicines.  Damage to other structures or organs. What happens before the procedure? Medicines Ask your health care provider about:  Changing or stopping your regular medicines. This is especially important if you are taking diabetes medicines or blood thinners.  Taking medicines such as aspirin and ibuprofen. These medicines can thin your blood. Do not take these medicines unless your health care provider tells you to take them.  Taking over-the-counter medicines, vitamins, herbs, and supplements. Tests You may have an exam or testing, such as:  X-rays of the bladder, urethra, or kidneys.  CT scan of the abdomen or pelvis.  Urine tests to check for signs of infection. General instructions  Follow instructions from your health care provider about eating or drinking restrictions.  Ask your health care provider what steps will be taken to help prevent infection. These steps may include: ? Washing skin with a germ-killing soap. ? Taking antibiotic medicine.  Plan to have a responsible adult take you home from the hospital or clinic. What happens during the procedure?  You will be given one or more of the following: ? A medicine to help you relax (sedative). ? A medicine to numb the area (local anesthetic).  The area around the opening of your urethra will be cleaned.  The cystoscope will be passed through your urethra into your bladder.  Germ-free (sterile) fluid will flow through the cystoscope to fill your bladder. The fluid will stretch your bladder so that your health care provider can clearly examine your bladder walls.  Your doctor will look at the urethra and bladder. Your doctor may take a biopsy or remove stones.  The cystoscope will be removed, and your bladder will  be emptied. The procedure may vary among health care providers and hospitals.   What can I expect after the procedure? After the procedure, it is common to have:  Some soreness or pain in your abdomen and urethra.  Urinary symptoms. These include: ? Mild pain or burning when you urinate. Pain should stop within a few minutes after you urinate. This may last for up to 1 week. ? A small amount of blood in your urine for several days. ? Feeling like you need to urinate but producing only a small amount of urine. Follow these instructions at home: Medicines  Take over-the-counter and prescription medicines only as told by your health care provider.  If you were prescribed an antibiotic medicine, take it as told by your health care provider. Do not stop taking the antibiotic even if you start to feel better. General instructions  Return to your normal activities as told by your health care provider. Ask your health care provider what  activities are safe for you.  If you were given a sedative during the procedure, it can affect you for several hours. Do not drive or operate machinery until your health care provider says that it is safe.  Watch for any blood in your urine. If the amount of blood in your urine increases, call your health care provider.  Follow instructions from your health care provider about eating or drinking restrictions.  If a tissue sample was removed for testing (biopsy) during your procedure, it is up to you to get your test results. Ask your health care provider, or the department that is doing the test, when your results will be ready.  Drink enough fluid to keep your urine pale yellow.  Keep all follow-up visits. This is important. Contact a health care provider if:  You have pain that gets worse or does not get better with medicine, especially pain when you urinate.  You have trouble urinating.  You have more blood in your urine. Get help right away  if:  You have blood clots in your urine.  You have abdominal pain.  You have a fever or chills.  You are unable to urinate. Summary  Cystoscopy is a procedure that is used to help diagnose and sometimes treat conditions that affect the lower urinary tract.  Cystoscopy is done using a thin, tube-shaped instrument with a light and camera at the end.  After the procedure, it is common to have some soreness or pain in your abdomen and urethra.  Watch for any blood in your urine. If the amount of blood in your urine increases, call your health care provider.  If you were prescribed an antibiotic medicine, take it as told by your health care provider. Do not stop taking the antibiotic even if you start to feel better. This information is not intended to replace advice given to you by your health care provider. Make sure you discuss any questions you have with your health care provider. Document Revised: 06/10/2020 Document Reviewed: 06/10/2020 Elsevier Patient Education  [place].

## 2020-12-26 NOTE — Progress Notes (Signed)
   12/26/2020 4:51 PM   Sergio Callahan September 24, 1951 [id number]  Reason for visit: Urinary symptoms, retrograde ejaculation  HPI: I saw Sergio Callahan for the above issues.  I previously saw him in February 2020 for cryoablation of genital warts.  He reports some new retrograde ejaculation that is not bothersome to him, as well as some mild urinary symptoms of weak stream and nocturia x2 on Flomax.  He denies any gross hematuria.  He has an extensive smoking history.  Urinalysis today notable for 3-10 RBCs, few bacteria, 0 WBCs, nitrite negative, no leukocytes.  I personally viewed and interpreted his CT from June 2019 that shows a 51 g prostate but no other urologic abnormalities.  We discussed common possible etiologies of microscopic hematuria including idiopathic, urolithiasis, medical renal disease, and malignancy. We discussed the new asymptomatic microscopic hematuria guidelines and risk categories of low, intermediate, and high risk that are based on age, risk factors like smoking, and degree of microscopic hematuria. We discussed work-up can range from repeat urinalysis, renal ultrasound and cystoscopy, to CT urogram and cystoscopy.  I recommended a renal ultrasound and cystoscopy for further evaluation of his low-grade microscopic hematuria, as well as to evaluate for possible UroLift or other outlet procedures with his urinary symptoms despite Flomax.  We discussed the correlation between retrograde ejaculation and Flomax and reassurance was provided.  RTC for renal ultrasound and cystoscopy to complete microscopic hematuria work-up   [name], [place] [address], [place], [place] [address] [phone]

## 2021-01-16 ENCOUNTER — Other Ambulatory Visit: Payer: Self-pay

## 2021-01-16 ENCOUNTER — Ambulatory Visit
Admission: RE | Admit: 2021-01-16 | Discharge: 2021-01-16 | Disposition: A | Discharge status: Home / Self Care | Payer: Medicare Other | Source: Ambulatory Visit | Attending: Urology | Admitting: Urology

## 2021-01-16 DIAGNOSIS — R3129 Other microscopic hematuria: Secondary | ICD-10-CM | POA: Insufficient documentation

## 2021-01-19 ENCOUNTER — Ambulatory Visit (INDEPENDENT_AMBULATORY_CARE_PROVIDER_SITE_OTHER): Payer: Medicare Other | Admitting: Urology

## 2021-01-19 ENCOUNTER — Other Ambulatory Visit: Payer: Self-pay

## 2021-01-19 ENCOUNTER — Encounter: Payer: Self-pay | Admitting: Urology

## 2021-01-19 VITALS — BP 137/83 | HR 82 | Ht 72.0 in | Wt 200.0 lb

## 2021-01-19 DIAGNOSIS — R3129 Other microscopic hematuria: Secondary | ICD-10-CM | POA: Diagnosis not present

## 2021-01-19 LAB — MICROSCOPIC EXAMINATION: Bacteria, UA: NONE SEEN

## 2021-01-19 LAB — URINALYSIS, COMPLETE
Bilirubin, UA: NEGATIVE
Glucose, UA: NEGATIVE
Ketones, UA: NEGATIVE
Leukocytes,UA: NEGATIVE
Nitrite, UA: NEGATIVE
Protein,UA: NEGATIVE
RBC, UA: NEGATIVE
Specific Gravity, UA: 1.015 (ref 1.005–1.030)
Urobilinogen, Ur: 0.2 mg/dL (ref 0.2–1.0)
pH, UA: 7 (ref 5.0–7.5)

## 2021-01-19 MED ORDER — LIDOCAINE HCL URETHRAL/MUCOSAL 2 % EX GEL
1.0000 | Freq: Once | CUTANEOUS | Status: AC
Start: 2021-01-19 — End: 2021-01-19
  Administered 2021-01-19: 1 via URETHRAL

## 2021-01-19 NOTE — Progress Notes (Signed)
Cystoscopy Procedure Note:  Indication: Microscopic hematuria, BPH  After informed consent and discussion of the procedure and its risks, Sergio Callahan was positioned and prepped in the standard fashion. Cystoscopy was performed with a flexible cystoscope. The urethra, bladder neck and entire bladder was visualized in a standard fashion. The prostate was moderate in size with obstructing lateral lobes, small amount of anterior tissue but no median lobe. The ureteral orifices were visualized in their normal location and orientation.  Mild bladder trabeculations, bladder grossly normal throughout, no abnormalities on retroflexion.  Imaging: Renal ultrasound with no significant abnormalities  Findings: Normal cystoscopy, moderate size prostate  Assessment and Plan: Continue Flomax for BPH, consider UroLift in the future if worsening urinary symptoms  Nickolas Madrid, MD 01/19/2021

## 2021-01-23 ENCOUNTER — Other Ambulatory Visit: Payer: Self-pay

## 2021-01-23 ENCOUNTER — Encounter: Payer: Self-pay | Admitting: Ophthalmology

## 2021-01-25 ENCOUNTER — Other Ambulatory Visit
Admission: RE | Admit: 2021-01-25 | Discharge: 2021-01-25 | Disposition: A | Discharge status: Home / Self Care | Payer: Medicare Other | Source: Ambulatory Visit | Attending: Ophthalmology | Admitting: Ophthalmology

## 2021-01-25 ENCOUNTER — Other Ambulatory Visit: Payer: Self-pay

## 2021-01-25 DIAGNOSIS — Z20822 Contact with and (suspected) exposure to covid-19: Secondary | ICD-10-CM | POA: Insufficient documentation

## 2021-01-25 DIAGNOSIS — Z01812 Encounter for preprocedural laboratory examination: Secondary | ICD-10-CM | POA: Diagnosis present

## 2021-01-25 LAB — SARS CORONAVIRUS 2 (TAT 6-24 HRS): SARS Coronavirus 2: NEGATIVE

## 2021-01-26 NOTE — Discharge Instructions (Signed)
INSTRUCTIONS FOLLOWING OCULOPLASTIC SURGERY AMY M. FOWLER, MD  AFTER YOUR EYE SURGERY, THER ARE MANY THINGS WHICH YOU, THE PATIENT, CAN DO TO ASSURE THE BEST POSSIBLE RESULT FROM YOUR OPERATION.  THIS SHEET SHOULD BE REFERRED TO WHENEVER QUESTIONS ARISE.  IF THERE ARE ANY QUESTIONS NOT ANSWERED HERE, DO NOT HESITATE TO CALL OUR OFFICE AT [phone] OR [phone].  THERE IS ALWAYS SOMEONE AVAILABLE TO CALL IF QUESTIONS OR PROBLEMS ARISE.  VISION: Your vision may be blurred and out of focus after surgery until you are able to stop using your ointment, swelling resolves and your eye(s) heal. This may take 1 to 2 weeks at the least.  If your vision becomes gradually more dim or dark, this is not normal and you need to call our office immediately.  EYE CARE: For the first 48 hours after surgery, use ice packs frequently - "20 minutes on, 20 minutes off" - to help reduce swelling and bruising.  Small bags of frozen peas or corn make good ice packs along with cloths soaked in ice water.  If you are wearing a patch or other type of dressing following surgery, keep this on for the amount of time specified by your doctor.  For the first week following surgery, you will need to treat your stitches with great care.  It is OK to shower, but take care to not allow soapy water to run into your eye(s) to help reduce chances of infection.  You may gently clean the eyelashes and around the eye(s) with cotton balls and sterile water, BUT DO NOT RUB THE STITCHES VIGOROUSLY.  Keeping your stitches moist with ointment will help promote healing with minimal scar formation.  ACTIVITY: When you leave the surgery center, you should go home, rest and be inactive.  The eye(s) may feel scratchy and keeping the eyes closed will allow for faster healing.  The first week following surgery, avoid straining (anything making the face turn red) or lifting over 20 pounds.  Additionally, avoid bending which causes your head to go below  your waist.  Using your eyes will NOT harm them, so feel free to read, watch television, use the computer, etc as desired.  Driving depends on each individual, so check with your doctor if you have questions about driving. Do not wear contact lenses for about 2 weeks.  Do not wear eye makeup for 2 weeks.  Avoid swimming, hot tubs, gardening, and dusting for 1 to 2 weeks to reduce the risk of an infection.  MEDICATIONS:  You will be given a prescription for an ointment to use 4 times a day on your stitches.  You can use the ointment in your eyes if they feel scratchy or irritated.  If you eyelid(s) don't close completely when you sleep, put some ointment in your eyes before bedtime.  EMERGENCY: If you experience SEVERE EYE PAIN OR HEADACHE UNRELIEVED BY TYLENOL OR TRAMADOL, NAUSEA OR VOMITING, WORSENING REDNESS, OR WORSENING VISION (ESPECIALLY VISION THAT WAS INITIALLY BETTER) CALL [phone] OR [phone] DURING BUSINESS HOURS OR AFTER HOURS. General Anesthesia, Adult, Care After This sheet gives you information about how to care for yourself after your procedure. Your health care provider may also give you more specific instructions. If you have problems or questions, contact your health care provider. What can I expect after the procedure? After the procedure, the following side effects are common:  Pain or discomfort at the IV site.  Nausea.  Vomiting.  Sore throat.  Trouble concentrating.  Feeling   cold or chills.  Feeling weak or tired.  Sleepiness and fatigue.  Soreness and body aches. These side effects can affect parts of the body that were not involved in surgery. Follow these instructions at home: For the time period you were told by your health care provider:  Rest.  Do not participate in activities where you could fall or become injured.  Do not drive or use machinery.  Do not drink alcohol.  Do not take sleeping pills or medicines that cause drowsiness.  Do  not make important decisions or sign legal documents.  Do not take care of children on your own.   Eating and drinking  Follow any instructions from your health care provider about eating or drinking restrictions.  When you feel hungry, start by eating small amounts of foods that are soft and easy to digest (bland), such as toast. Gradually return to your regular diet.  Drink enough fluid to keep your urine pale yellow.  If you vomit, rehydrate by drinking water, juice, or clear broth. General instructions  If you have sleep apnea, surgery and certain medicines can increase your risk for breathing problems. Follow instructions from your health care provider about wearing your sleep device: ? Anytime you are sleeping, including during daytime naps. ? While taking prescription pain medicines, sleeping medicines, or medicines that make you drowsy.  Have a responsible adult stay with you for the time you are told. It is important to have someone help care for you until you are awake and alert.  Return to your normal activities as told by your health care provider. Ask your health care provider what activities are safe for you.  Take over-the-counter and prescription medicines only as told by your health care provider.  If you smoke, do not smoke without supervision.  Keep all follow-up visits as told by your health care provider. This is important. Contact a health care provider if:  You have nausea or vomiting that does not get better with medicine.  You cannot eat or drink without vomiting.  You have pain that does not get better with medicine.  You are unable to pass urine.  You develop a skin rash.  You have a fever.  You have redness around your IV site that gets worse. Get help right away if:  You have difficulty breathing.  You have chest pain.  You have blood in your urine or stool, or you vomit blood. Summary  After the procedure, it is common to have a sore  throat or nausea. It is also common to feel tired.  Have a responsible adult stay with you for the time you are told. It is important to have someone help care for you until you are awake and alert.  When you feel hungry, start by eating small amounts of foods that are soft and easy to digest (bland), such as toast. Gradually return to your regular diet.  Drink enough fluid to keep your urine pale yellow.  Return to your normal activities as told by your health care provider. Ask your health care provider what activities are safe for you. This information is not intended to replace advice given to you by your health care provider. Make sure you discuss any questions you have with your health care provider. Document Revised: 07/14/2020 Document Reviewed: 02/11/2020 Elsevier Patient Education  2021 [employer]  

## 2021-01-27 ENCOUNTER — Ambulatory Visit: Payer: Medicare Other | Admitting: Anesthesiology

## 2021-01-27 ENCOUNTER — Other Ambulatory Visit: Payer: Self-pay

## 2021-01-27 ENCOUNTER — Encounter: Payer: Self-pay | Admitting: Ophthalmology

## 2021-01-27 ENCOUNTER — Ambulatory Visit
Admission: RE | Admit: 2021-01-27 | Discharge: 2021-01-27 | Disposition: A | Discharge status: Home / Self Care | Payer: Medicare Other | Attending: Ophthalmology | Admitting: Ophthalmology

## 2021-01-27 ENCOUNTER — Encounter
Admission: RE | Disposition: A | Discharge status: Home / Self Care | Payer: Self-pay | Source: Home / Self Care | Attending: Ophthalmology

## 2021-01-27 DIAGNOSIS — H02831 Dermatochalasis of right upper eyelid: Secondary | ICD-10-CM | POA: Insufficient documentation

## 2021-01-27 DIAGNOSIS — H02403 Unspecified ptosis of bilateral eyelids: Secondary | ICD-10-CM | POA: Diagnosis not present

## 2021-01-27 DIAGNOSIS — Z7982 Long term (current) use of aspirin: Secondary | ICD-10-CM | POA: Diagnosis not present

## 2021-01-27 DIAGNOSIS — H02834 Dermatochalasis of left upper eyelid: Secondary | ICD-10-CM | POA: Insufficient documentation

## 2021-01-27 DIAGNOSIS — H57813 Brow ptosis, bilateral: Secondary | ICD-10-CM | POA: Insufficient documentation

## 2021-01-27 DIAGNOSIS — Z79899 Other long term (current) drug therapy: Secondary | ICD-10-CM | POA: Insufficient documentation

## 2021-01-27 DIAGNOSIS — Z955 Presence of coronary angioplasty implant and graft: Secondary | ICD-10-CM | POA: Insufficient documentation

## 2021-01-27 DIAGNOSIS — Z87891 Personal history of nicotine dependence: Secondary | ICD-10-CM | POA: Diagnosis not present

## 2021-01-27 HISTORY — PX: BROW LIFT: SHX178

## 2021-01-27 SURGERY — BLEPHAROPLASTY
Anesthesia: General | Laterality: Bilateral

## 2021-01-27 MED ORDER — ONDANSETRON HCL 4 MG/2ML IJ SOLN: 4.0000 mg | Freq: Once | INTRAMUSCULAR | Status: DC | PRN

## 2021-01-27 MED ORDER — ALFENTANIL 500 MCG/ML IJ INJ
INJECTION | INTRAVENOUS | Status: DC | PRN
  Administered 2021-01-27: 500 ug via INTRAVENOUS
  Administered 2021-01-27 (×2): 250 ug via INTRAVENOUS

## 2021-01-27 MED ORDER — ERYTHROMYCIN 5 MG/GM OP OINT
TOPICAL_OINTMENT | OPHTHALMIC | Status: DC | PRN
  Administered 2021-01-27: 1 via OPHTHALMIC

## 2021-01-27 MED ORDER — ERYTHROMYCIN 5 MG/GM OP OINT: TOPICAL_OINTMENT | OPHTHALMIC | 2 refills | Status: DC

## 2021-01-27 MED ORDER — TRAMADOL HCL 50 MG PO TABS: ORAL_TABLET | ORAL | 0 refills | Status: DC

## 2021-01-27 MED ORDER — LACTATED RINGERS IV SOLN: INTRAVENOUS | Status: DC

## 2021-01-27 MED ORDER — BSS IO SOLN
INTRAOCULAR | Status: DC | PRN
  Administered 2021-01-27: 15 mL

## 2021-01-27 MED ORDER — FENTANYL CITRATE (PF) 100 MCG/2ML IJ SOLN: 25.0000 ug | INTRAMUSCULAR | Status: DC | PRN

## 2021-01-27 MED ORDER — HYDROCODONE-ACETAMINOPHEN 7.5-325 MG PO TABS: 1.0000 | ORAL_TABLET | Freq: Once | ORAL | Status: DC | PRN

## 2021-01-27 MED ORDER — MIDAZOLAM HCL 2 MG/2ML IJ SOLN
INTRAMUSCULAR | Status: DC | PRN
  Administered 2021-01-27: 2 mg via INTRAVENOUS

## 2021-01-27 MED ORDER — LIDOCAINE-EPINEPHRINE 2 %-1:100000 IJ SOLN
INTRAMUSCULAR | Status: DC | PRN
  Administered 2021-01-27: 6 mL via OPHTHALMIC

## 2021-01-27 MED ORDER — TETRACAINE HCL 0.5 % OP SOLN
OPHTHALMIC | Status: DC | PRN
  Administered 2021-01-27: 2 [drp] via OPHTHALMIC

## 2021-01-27 MED ORDER — DEXMEDETOMIDINE HCL 200 MCG/2ML IV SOLN
INTRAVENOUS | Status: DC | PRN
  Administered 2021-01-27 (×2): 10 ug via INTRAVENOUS

## 2021-01-27 SURGICAL SUPPLY — 25 items
APPLICATOR COTTON TIP WD 3 STR (MISCELLANEOUS) ×4 IMPLANT
BLADE SURG 15 STRL LF DISP TIS (BLADE) ×1 IMPLANT
BLADE SURG 15 STRL SS (BLADE) ×2
CORD BIP STRL DISP 12FT (MISCELLANEOUS) ×2 IMPLANT
DRAPE HEAD BAR (DRAPES) ×2 IMPLANT
GAUZE SPONGE 4X4 12PLY STRL (GAUZE/BANDAGES/DRESSINGS) ×2 IMPLANT
GLOVE SURG LX 7.0 MICRO (GLOVE) ×2
GLOVE SURG LX STRL 7.0 MICRO (GLOVE) ×2 IMPLANT
GOWN STRL REUS W/ TWL LRG LVL3 (GOWN DISPOSABLE) ×1 IMPLANT
GOWN STRL REUS W/TWL LRG LVL3 (GOWN DISPOSABLE) ×2
MARKER SKIN XFINE TIP W/RULER (MISCELLANEOUS) ×2 IMPLANT
NEEDLE FILTER BLUNT 18X 1/2SAF (NEEDLE) ×1
NEEDLE FILTER BLUNT 18X1 1/2 (NEEDLE) ×1 IMPLANT
NEEDLE HYPO 30X.5 LL (NEEDLE) ×4 IMPLANT
PACK ENT CUSTOM (PACKS) ×2 IMPLANT
SOL PREP PVP 2OZ (MISCELLANEOUS) ×2
SOLUTION PREP PVP 2OZ (MISCELLANEOUS) ×1 IMPLANT
SPONGE GAUZE 2X2 8PLY STRL LF (GAUZE/BANDAGES/DRESSINGS) ×20 IMPLANT
SUT CHROMIC 5 0 P 3 (SUTURE) ×4 IMPLANT
SUT GUT PLAIN 6-0 1X18 ABS (SUTURE) ×2 IMPLANT
SUT PROLENE 5 0 P 3 (SUTURE) ×4 IMPLANT
SUT PROLENE 6 0 P 1 18 (SUTURE) ×4 IMPLANT
SYR 10ML LL (SYRINGE) ×2 IMPLANT
SYR 3ML LL SCALE MARK (SYRINGE) ×2 IMPLANT
WATER STERILE IRR 250ML POUR (IV SOLUTION) ×2 IMPLANT

## 2021-01-27 NOTE — Anesthesia Postprocedure Evaluation (Signed)
Anesthesia Post Note  Patient: Sergio Callahan  Procedure(s) Performed: BLEPHAROPLASTY UPPER EYELID; W/EXCESS SKIN BROW PTOSIS REPAIR AND BLEPHAROPTOSIS REPAIR; RESECT EX BILATERAL (Bilateral )     Patient location during evaluation: PACU Anesthesia Type: General Level of consciousness: awake and alert Pain management: pain level controlled Vital Signs Assessment: post-procedure vital signs reviewed and stable Respiratory status: spontaneous breathing, nonlabored ventilation, respiratory function stable and patient connected to nasal cannula oxygen Cardiovascular status: blood pressure returned to baseline and stable Postop Assessment: no apparent nausea or vomiting Anesthetic complications: no   No complications documented.  Alisa Graff

## 2021-01-27 NOTE — Anesthesia Preprocedure Evaluation (Signed)
Anesthesia Evaluation  Patient identified by MRN, date of birth, ID band Patient awake    Reviewed: Allergy & Precautions, H&P , NPO status , Patient's Chart, lab work & pertinent test results, reviewed documented beta blocker date and time   Airway Mallampati: II  TM Distance: >3 FB Neck ROM: full    Dental no notable dental hx.    Pulmonary neg pulmonary ROS, former smoker,    Pulmonary exam normal breath sounds clear to auscultation       Cardiovascular Exercise Tolerance: Good hypertension, + CAD (normal myocardial perfusion scan 2019) and + Peripheral Vascular Disease   Rhythm:regular Rate:Normal  H/o collagen vascular dz (lupus)   Neuro/Psych Anxiety negative neurological ROS     GI/Hepatic Neg liver ROS, GERD  ,  Endo/Other  negative endocrine ROS  Renal/GU negative Renal ROS  negative genitourinary   Musculoskeletal   Abdominal   Peds  Hematology negative hematology ROS (+)   Anesthesia Other Findings   Reproductive/Obstetrics negative OB ROS                             Anesthesia Physical Anesthesia Plan  ASA: III  Anesthesia Plan: General   Post-op Pain Management:    Induction:   PONV Risk Score and Plan: 2 and Midazolam and Ondansetron  Airway Management Planned:   Additional Equipment:   Intra-op Plan:   Post-operative Plan:   Informed Consent: I have reviewed the patients History and Physical, chart, labs and discussed the procedure including the risks, benefits and alternatives for the proposed anesthesia with the patient or authorized representative who has indicated his/her understanding and acceptance.     Dental Advisory Given  Plan Discussed with: CRNA  Anesthesia Plan Comments:         Anesthesia Quick Evaluation

## 2021-01-27 NOTE — Interval H&P Note (Signed)
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  The various methods of treatment have been discussed with the patient and family. After consideration of risks, benefits and other options for treatment, the patient has consented to  Procedure(s): BLEPHAROPLASTY UPPER EYELID; W/EXCESS SKIN BROW PTOSIS REPAIR AND BLEPHAROPTOSIS REPAIR; RESECT EX BILATERAL (Bilateral) as a surgical intervention.  The patient's history has been reviewed, patient examined, no change in status, stable for surgery.  I have reviewed the patient's chart and labs.  Questions were answered to the patient's satisfaction.     Vickki Muff, Kyia Rhude M

## 2021-01-27 NOTE — H&P (Signed)
See the history and physical completed at [facility] on 01/18/21 and scanned into the chart.

## 2021-01-27 NOTE — Transfer of Care (Signed)
Immediate Anesthesia Transfer of Care Note  Patient: Sergio Callahan  Procedure(s) Performed: BLEPHAROPLASTY UPPER EYELID; W/EXCESS SKIN BROW PTOSIS REPAIR AND BLEPHAROPTOSIS REPAIR; RESECT EX BILATERAL (Bilateral )  Patient Location: PACU  Anesthesia Type: General  Level of Consciousness: awake, alert  and patient cooperative  Airway and Oxygen Therapy: Patient Spontanous Breathing and Patient connected to supplemental oxygen  Post-op Assessment: Post-op Vital signs reviewed, Patient's Cardiovascular Status Stable, Respiratory Function Stable, Patent Airway and No signs of Nausea or vomiting  Post-op Vital Signs: Reviewed and stable  Complications: No complications documented.

## 2021-01-27 NOTE — Op Note (Signed)
Preoperative Diagnosis:   1.  Visually significant bilateral  brow ptosis.  2.  Visually significant blepharoptosis both  Upper Eyelid(s) 3.  Visually significant dermatochalasis bilateral  Upper Eyelid(s)  Postoperative Diagnosis: Same.   Procedure(s) Performed:   1. bilateral  Direct brow lift to improve vision.  2.  Blepharoptosis repair with levator aponeurosis advancement bilateral  Upper Eyelid(s) 3.  Upper eyelid blepharoplasty with excess skin excision  bilateral  Upper Eyelid(s)  Surgeon: Philis Pique. Vickki Muff, M.D.   Assistants: None   Anesthesia: MAC  Specimens: None.  Estimated Blood Loss: Minimal.  Complications: None.  Operative Findings: None Dictated  PROCEDURE:   Allergies were reviewed and the patient is allergic to fosamax [alendronate sodium] and percocet [oxycodone-acetaminophen]..   After the risks, benefits, complications and alternatives were discussed with the patient, appropriate informed consent was obtained. While seated in an upright position and looking in primary gaze, the amount of supra-brow skin to be removed was measured and marked in an elliptical pattern. The patient was then brought to the operating suite and reclined supine.  Timeout was conducted and the patient was sedated. Local anesthetic consisting of a 50-50 mixture of 2% lidocaine with epinephrine and 0.75% bupivacaine with added Hylenex was injected subcutaneously to the bilateral  brow region(s) and down to the periosteum and  subcutaneously to both  upper eyelid(s). After adequate local was instilled, the patient was prepped and draped in the usual sterile fashion for eyelid surgery.   Attention was turned to the right brow region. A #15 blade was used to create a bevelled incision along the premarked incision line. A skin and subcutaneous tissue flap was then excised and hemostasis was obtained with bipolar cautery. The deep tissues were reapproximated with interrupted vertical 5-0 chromic  sutures. The skin margin was reapproximated with a running locking 5-0 Prolene suture. Attention was then turned to the opposite brow region where the same procedure was performed in the same manner.    Attention was then turned to the upper eyelids. A 58m upper eyelid crease incision line was marked with calipers on both  upper eyelid(s).  A pinch test was used to estimate the amount of excess skin to remove and this was marked in standard blepharoplasty style fashion. Attention was turned to the right  upper eyelid. A #15 blade was used to open the premarked incision line. A Skin only flap was excised and hemostasis was obtained with bipolar cautery.   Westcott scissors were then used to transect through orbicularis for the length of the incision down to the tarsal plate. Epitarsus was dissected to create a smooth surface to suture to. Dissection was then carried superiorly in the plane between orbicularis and orbital septum. Once the preaponeurotic fat pocket was identified, the orbital septum was opened. This revealed the levator and its aponeurosis.    Attention was then turned to the opposite eyelid where the same procedure was performed in the same manner.   3 interrupted 6-0 Prolene sutures were then passed partial thickness through the tarsal plates of both  upper eyelid(s). These sutures were placed in line with the mid pupillary, medial limbal, and lateral limbal lines. The sutures were fixed to the levator aponeurosis and adjusted until a nice lid height and contour were achieved. Once nice symmetry was achieved, the skin incisions were closed with a combination of interrupted and  running 6-0 plain gut suture.   The patient tolerated the procedure well. Erythromycin ophthalmic ointment was applied to the incision  site(s) followed by ice packs.The patient was taken to the recovery area where he recovered without difficulty.  Post-Op Plan/Instructions:   The patient was instructed to use ice  packs frequently for the next 48 hours. he was instructed to use Erythromycin ophthalmic ointment on his incisions 4 times a day for the next 12 to 14 days. he was given a prescription for tramadol (or similar) for pain control should Tylenol not be effective. he was asked to to follow up in 10-12 days time for suture removal or sooner as needed for problems.  Tallon Gertz M. Vickki Muff, M.D. Ophthalmology

## 2021-01-27 NOTE — Anesthesia Procedure Notes (Signed)
Procedure Name: MAC Date/Time: 01/27/2021 8:17 AM Performed by: Jeannene Patella, CRNA Pre-anesthesia Checklist: Patient identified, Emergency Drugs available, Suction available, Timeout performed and Patient being monitored Patient Re-evaluated:Patient Re-evaluated prior to induction Oxygen Delivery Method: Nasal cannula Placement Confirmation: positive ETCO2

## 2021-01-30 ENCOUNTER — Encounter: Payer: Self-pay | Admitting: Ophthalmology

## 2021-05-05 ENCOUNTER — Encounter
Admission: RE | Disposition: A | Discharge status: Home / Self Care | Payer: Self-pay | Source: Home / Self Care | Attending: Gastroenterology

## 2021-05-05 ENCOUNTER — Ambulatory Visit: Payer: Medicare Other | Admitting: Anesthesiology

## 2021-05-05 ENCOUNTER — Ambulatory Visit
Admission: RE | Admit: 2021-05-05 | Discharge: 2021-05-05 | Disposition: A | Discharge status: Home / Self Care | Payer: Medicare Other | Attending: Gastroenterology | Admitting: Gastroenterology

## 2021-05-05 ENCOUNTER — Encounter: Payer: Self-pay | Admitting: *Deleted

## 2021-05-05 DIAGNOSIS — M329 Systemic lupus erythematosus, unspecified: Secondary | ICD-10-CM | POA: Insufficient documentation

## 2021-05-05 DIAGNOSIS — K2970 Gastritis, unspecified, without bleeding: Secondary | ICD-10-CM | POA: Diagnosis not present

## 2021-05-05 DIAGNOSIS — Z79899 Other long term (current) drug therapy: Secondary | ICD-10-CM | POA: Insufficient documentation

## 2021-05-05 DIAGNOSIS — C7A8 Other malignant neuroendocrine tumors: Secondary | ICD-10-CM | POA: Insufficient documentation

## 2021-05-05 DIAGNOSIS — Z888 Allergy status to other drugs, medicaments and biological substances status: Secondary | ICD-10-CM | POA: Diagnosis not present

## 2021-05-05 DIAGNOSIS — K640 First degree hemorrhoids: Secondary | ICD-10-CM | POA: Insufficient documentation

## 2021-05-05 DIAGNOSIS — Z7982 Long term (current) use of aspirin: Secondary | ICD-10-CM | POA: Diagnosis not present

## 2021-05-05 DIAGNOSIS — K5730 Diverticulosis of large intestine without perforation or abscess without bleeding: Secondary | ICD-10-CM | POA: Insufficient documentation

## 2021-05-05 DIAGNOSIS — Z800 Family history of malignant neoplasm of digestive organs: Secondary | ICD-10-CM | POA: Insufficient documentation

## 2021-05-05 DIAGNOSIS — Z955 Presence of coronary angioplasty implant and graft: Secondary | ICD-10-CM | POA: Diagnosis not present

## 2021-05-05 DIAGNOSIS — D120 Benign neoplasm of cecum: Secondary | ICD-10-CM | POA: Insufficient documentation

## 2021-05-05 DIAGNOSIS — K635 Polyp of colon: Secondary | ICD-10-CM | POA: Diagnosis not present

## 2021-05-05 DIAGNOSIS — K3189 Other diseases of stomach and duodenum: Secondary | ICD-10-CM | POA: Insufficient documentation

## 2021-05-05 DIAGNOSIS — K317 Polyp of stomach and duodenum: Secondary | ICD-10-CM | POA: Insufficient documentation

## 2021-05-05 DIAGNOSIS — Z885 Allergy status to narcotic agent status: Secondary | ICD-10-CM | POA: Diagnosis not present

## 2021-05-05 DIAGNOSIS — R195 Other fecal abnormalities: Secondary | ICD-10-CM | POA: Insufficient documentation

## 2021-05-05 DIAGNOSIS — K449 Diaphragmatic hernia without obstruction or gangrene: Secondary | ICD-10-CM | POA: Diagnosis not present

## 2021-05-05 HISTORY — DX: Unspecified osteoarthritis, unspecified site: M19.90

## 2021-05-05 HISTORY — DX: Male erectile dysfunction, unspecified: N52.9

## 2021-05-05 HISTORY — DX: Abdominal aortic aneurysm, without rupture: I71.4

## 2021-05-05 HISTORY — DX: Abdominal aortic aneurysm, without rupture, unspecified: I71.40

## 2021-05-05 HISTORY — PX: ESOPHAGOGASTRODUODENOSCOPY (EGD) WITH PROPOFOL: SHX5813

## 2021-05-05 HISTORY — DX: Aneurysm of iliac artery: I72.3

## 2021-05-05 HISTORY — PX: COLONOSCOPY WITH PROPOFOL: SHX5780

## 2021-05-05 HISTORY — DX: Carpal tunnel syndrome, unspecified upper limb: G56.00

## 2021-05-05 HISTORY — DX: Hypo-osmolality and hyponatremia: E87.1

## 2021-05-05 HISTORY — DX: Other shoulder lesions, unspecified shoulder: M75.80

## 2021-05-05 HISTORY — DX: Personal history of adenomatous and serrated colon polyps: Z86.0101

## 2021-05-05 HISTORY — DX: Papillomavirus as the cause of diseases classified elsewhere: B97.7

## 2021-05-05 HISTORY — DX: Anogenital (venereal) warts: A63.0

## 2021-05-05 HISTORY — DX: Personal history of colonic polyps: Z86.010

## 2021-05-05 HISTORY — DX: Atherosclerosis of other arteries: I70.8

## 2021-05-05 SURGERY — COLONOSCOPY WITH PROPOFOL
Anesthesia: General

## 2021-05-05 MED ORDER — PROPOFOL 500 MG/50ML IV EMUL
INTRAVENOUS | Status: DC | PRN
  Administered 2021-05-05: 150 ug/kg/min via INTRAVENOUS

## 2021-05-05 MED ORDER — LIDOCAINE HCL (CARDIAC) PF 100 MG/5ML IV SOSY
PREFILLED_SYRINGE | INTRAVENOUS | Status: DC | PRN
  Administered 2021-05-05: 40 mg via INTRAVENOUS

## 2021-05-05 MED ORDER — LIDOCAINE HCL (PF) 2 % IJ SOLN
INTRAMUSCULAR | Status: AC
  Filled 2021-05-05: qty 20

## 2021-05-05 MED ORDER — SODIUM CHLORIDE 0.9 % IV SOLN: INTRAVENOUS | Status: DC

## 2021-05-05 MED ORDER — PROPOFOL 10 MG/ML IV BOLUS
INTRAVENOUS | Status: DC | PRN
  Administered 2021-05-05: 80 mg via INTRAVENOUS
  Administered 2021-05-05: 20 mg via INTRAVENOUS

## 2021-05-05 NOTE — Op Note (Signed)
[facility] Gastroenterology Patient Name: Sergio Callahan Procedure Date: 05/05/2021 10:18 AM MRN: [id number] Account #: [id number] Date of Birth: Feb 03, 1951 Admit Type: Outpatient Age: 70 Room: [facility] ENDO ROOM 3 Gender: Male Note Status: Finalized Procedure:             Colonoscopy Indications:           Hemoccult Positive Stool Providers:             Andrey Farmer MD, MD Medicines:             Monitored Anesthesia Care Complications:         No immediate complications. Estimated blood loss:                         Minimal. Procedure:             Pre-Anesthesia Assessment:                        - Prior to the procedure, a History and Physical was                         performed, and patient medications and allergies were                         reviewed. The patient is competent. The risks and                         benefits of the procedure and the sedation options and                         risks were discussed with the patient. All questions                         were answered and informed consent was obtained.                         Patient identification and proposed procedure were                         verified by the physician, the nurse, the anesthetist                         and the technician in the endoscopy suite. Mental                         Status Examination: alert and oriented. Airway                         Examination: normal oropharyngeal airway and neck                         mobility. Respiratory Examination: clear to                         auscultation. CV Examination: normal. Prophylactic                         Antibiotics: The patient does not require prophylactic  antibiotics. Prior Anticoagulants: The patient has                         taken no previous anticoagulant or antiplatelet                         agents. ASA Grade Assessment: III - A patient with                         severe systemic  disease. After reviewing the risks and                         benefits, the patient was deemed in satisfactory                         condition to undergo the procedure. The anesthesia                         plan was to use monitored anesthesia care (MAC).                         Immediately prior to administration of medications,                         the patient was re-assessed for adequacy to receive                         sedatives. The heart rate, respiratory rate, oxygen                         saturations, blood pressure, adequacy of pulmonary                         ventilation, and response to care were monitored                         throughout the procedure. The physical status of the                         patient was re-assessed after the procedure.                        After obtaining informed consent, the colonoscope was                         passed under direct vision. Throughout the procedure,                         the patient's blood pressure, pulse, and oxygen                         saturations were monitored continuously. The                         Colonoscope was introduced through the anus and                         advanced to the the terminal ileum. The colonoscopy  was performed without difficulty. The patient                         tolerated the procedure well. The quality of the bowel                         preparation was good. Findings:      The perianal and digital rectal examinations were normal.      The terminal ileum appeared normal.      A 1 mm polyp was found in the cecum. The polyp was sessile. The polyp       was removed with a jumbo cold forceps. Resection and retrieval were       complete. Estimated blood loss was minimal.      A 3 mm polyp was found in the transverse colon. The polyp was sessile.       The polyp was removed with a cold snare. Resection and retrieval were       complete. Estimated blood  loss was minimal.      A less than 1 mm polyp was found in the transverse colon. The polyp was       sessile. The polyp was removed with a jumbo cold forceps. Resection and       retrieval were complete. Estimated blood loss was minimal.      A 2 mm polyp was found in the sigmoid colon. The polyp was sessile. The       polyp was removed with a jumbo cold forceps. Resection and retrieval       were complete. Estimated blood loss was minimal.      A less than 1 mm polyp was found in the recto-sigmoid colon. The polyp       was sessile. The polyp was removed with a jumbo cold forceps. Resection       and retrieval were complete. Estimated blood loss was minimal.      A localized area of mildly erythematous mucosa was found in the sigmoid       colon. Biopsies were taken with a cold forceps for histology. Estimated       blood loss was minimal.      A few small-mouthed diverticula were found in the sigmoid colon.      Non-bleeding internal hemorrhoids were found during retroflexion. The       hemorrhoids were Grade I (internal hemorrhoids that do not prolapse).      The exam was otherwise without abnormality on direct and retroflexion       views. Impression:            - The examined portion of the ileum was normal.                        - One 1 mm polyp in the cecum, removed with a jumbo                         cold forceps. Resected and retrieved.                        - One 3 mm polyp in the transverse colon, removed with                         a cold snare. Resected  and retrieved.                        - One less than 1 mm polyp in the transverse colon,                         removed with a jumbo cold forceps. Resected and                         retrieved.                        - One 2 mm polyp in the sigmoid colon, removed with a                         jumbo cold forceps. Resected and retrieved.                        - One less than 1 mm polyp at the recto-sigmoid colon,                          removed with a jumbo cold forceps. Resected and                         retrieved.                        - Erythematous mucosa in the sigmoid colon. Biopsied.                        - Diverticulosis in the sigmoid colon.                        - Non-bleeding internal hemorrhoids.                        - The examination was otherwise normal on direct and                         retroflexion views. Recommendation:        - Discharge patient to home.                        - Resume previous diet.                        - Continue present medications.                        - Await pathology results.                        - Repeat colonoscopy for surveillance based on                         pathology results.                        - Return to referring physician as previously                         scheduled.  Procedure Code(s):     --- Professional ---                        [phone], Colonoscopy, flexible; with removal of                         tumor(s), polyp(s), or other lesion(s) by snare                         technique                        45380, 14, Colonoscopy, flexible; with biopsy, single                         or multiple Diagnosis Code(s):     --- Professional ---                        K64.0, First degree hemorrhoids                        K63.5, Polyp of colon                        K63.89, Other specified diseases of intestine                        K57.30, Diverticulosis of large intestine without                         perforation or abscess without bleeding CPT copyright 2019 [employer]. All rights reserved. The codes documented in this report are preliminary and upon coder review may  be revised to meet current compliance requirements. Andrey Farmer MD, MD 05/05/2021 11:14:24 AM Number of Addenda: 0 Note Initiated On: 05/05/2021 10:18 AM Scope Withdrawal Time: 0 hours 11 minutes 45 seconds  Total Procedure Duration: 0  hours 15 minutes 15 seconds  Estimated Blood Loss:  Estimated blood loss was minimal.      [facility]

## 2021-05-05 NOTE — Anesthesia Preprocedure Evaluation (Signed)
Anesthesia Evaluation  Patient identified by MRN, date of birth, ID band Patient awake    Reviewed: Allergy & Precautions, NPO status , Patient's Chart, lab work & pertinent test results  History of Anesthesia Complications Negative for: history of anesthetic complications  Airway Mallampati: II       Dental   Pulmonary neg sleep apnea, neg COPD, Not current smoker, former smoker,           Cardiovascular hypertension, Pt. on medications + CAD and + Cardiac Stents  (-) CHF (-) dysrhythmias (-) Valvular Problems/Murmurs     Neuro/Psych neg Seizures Anxiety    GI/Hepatic Neg liver ROS, GERD  Medicated and Controlled,  Endo/Other  neg diabetes  Renal/GU negative Renal ROS     Musculoskeletal   Abdominal   Peds  Hematology  (+) anemia ,   Anesthesia Other Findings   Reproductive/Obstetrics                             Anesthesia Physical Anesthesia Plan  ASA: 3  Anesthesia Plan: General   Post-op Pain Management:    Induction: Intravenous  PONV Risk Score and Plan: 2 and Propofol infusion and TIVA  Airway Management Planned: Nasal Cannula  Additional Equipment:   Intra-op Plan:   Post-operative Plan:   Informed Consent: I have reviewed the patients History and Physical, chart, labs and discussed the procedure including the risks, benefits and alternatives for the proposed anesthesia with the patient or authorized representative who has indicated his/her understanding and acceptance.       Plan Discussed with:   Anesthesia Plan Comments:         Anesthesia Quick Evaluation

## 2021-05-05 NOTE — Anesthesia Postprocedure Evaluation (Signed)
Anesthesia Post Note  Patient: Sergio Callahan  Procedure(s) Performed: COLONOSCOPY WITH PROPOFOL ESOPHAGOGASTRODUODENOSCOPY (EGD) WITH PROPOFOL  Patient location during evaluation: Endoscopy Anesthesia Type: General Level of consciousness: awake and alert Pain management: pain level controlled Vital Signs Assessment: post-procedure vital signs reviewed and stable Respiratory status: spontaneous breathing and respiratory function stable Cardiovascular status: stable Anesthetic complications: no   No notable events documented.   Last Vitals:  Vitals:   05/05/21 1102 05/05/21 1103  BP:  103/69  Pulse:  77  Resp:    Temp: (!) 35.8 C   SpO2:  94%    Last Pain:  Vitals:   05/05/21 1102  TempSrc: Temporal  PainSc: Asleep                 Annette Liotta K

## 2021-05-05 NOTE — Transfer of Care (Signed)
Immediate Anesthesia Transfer of Care Note  Patient: Sergio Callahan  Procedure(s) Performed: COLONOSCOPY WITH PROPOFOL ESOPHAGOGASTRODUODENOSCOPY (EGD) WITH PROPOFOL  Patient Location: PACU  Anesthesia Type:General  Level of Consciousness: drowsy  Airway & Oxygen Therapy: Patient Spontanous Breathing  Post-op Assessment: Report given to RN and Post -op Vital signs reviewed and stable  Post vital signs: Reviewed and stable  Last Vitals:  Vitals Value Taken Time  BP 103/69 05/05/21 1103  Temp 35.8 C 05/05/21 1102  Pulse 77 05/05/21 1103  Resp    SpO2 94 % 05/05/21 1103    Last Pain:  Vitals:   05/05/21 1102  TempSrc: Temporal  PainSc: Asleep         Complications: No notable events documented.

## 2021-05-05 NOTE — H&P (Signed)
Outpatient short stay form Pre-procedure 05/05/2021 10:22 AM Raylene Miyamoto MD, MPH  Primary Physician: Dr. Edwina Barth  Reason for visit:  FOBT positive  History of present illness:   70 y/o gentleman with history of lupus here for positive hemoccult. Had normal colonoscopy in 2020. Father with colon cancer in his 62's. No abdominal surgeries. No blood thinners. Has had some more loose stools recently. No significant NSAID use.    Current Facility-Administered Medications:    0.9 %  sodium chloride infusion, , Intravenous, Continuous, Demitrus Francisco, Hilton Cork, MD, Last Rate: 20 mL/hr at 05/05/21 1001, New Bag at 05/05/21 1001  Medications Prior to Admission  Medication Sig Dispense Refill Last Dose   ALPRAZolam (XANAX) 0.5 MG tablet Take 0.5 mg by mouth 3 (three) times daily as needed for anxiety.    05/04/2021   aspirin EC 81 MG tablet Take 81 mg by mouth daily.   05/04/2021   azaTHIOprine (IMURAN) 50 MG tablet Take 125 mg by mouth daily.    05/04/2021   cetirizine (ZYRTEC) 10 MG tablet Take 10 mg by mouth daily.   05/04/2021   finasteride (PROSCAR) 5 MG tablet Take 5 mg by mouth daily.   05/04/2021   fluticasone (FLONASE) 50 MCG/ACT nasal spray Place into both nostrils daily.      gabapentin (NEURONTIN) 300 MG capsule Take 300 mg by mouth 3 (three) times daily.      hydrochlorothiazide (HYDRODIURIL) 25 MG tablet Take 25 mg by mouth daily.   05/05/2021   hydroxychloroquine (PLAQUENIL) 200 MG tablet Take 200 mg by mouth 2 (two) times daily.   05/04/2021   lisinopril (ZESTRIL) 40 MG tablet Take 40 mg by mouth daily.   05/04/2021   metoprolol succinate (TOPROL-XL) 100 MG 24 hr tablet Take 100 mg by mouth at bedtime. Take with or immediately following a meal.   05/04/2021   omeprazole (PRILOSEC) 20 MG capsule Take 20 mg by mouth daily.    05/04/2021   pravastatin (PRAVACHOL) 40 MG tablet Take 40 mg by mouth at bedtime.   05/04/2021   spironolactone (ALDACTONE) 25 MG tablet Take 25 mg by mouth daily.    05/04/2021   tamsulosin (FLOMAX) 0.4 MG CAPS capsule Take 0.4 mg by mouth daily after breakfast.   05/04/2021   cyanocobalamin (,VITAMIN B-12,) 1000 MCG/ML injection Inject 1,000 mcg into the muscle every 14 (fourteen) days.       ELDERBERRY PO Take by mouth daily.      erythromycin ophthalmic ointment Apply to sutures 4 times a day for 10-12 days.  Discontinue if allergy develops and call our office 3.5 g 2    ibuprofen (ADVIL,MOTRIN) 200 MG tablet Take 600 mg by mouth as needed for headache or moderate pain.       nicotine (NICODERM CQ - DOSED IN MG/24 HOURS) 21 mg/24hr patch Place 21 mg onto the skin daily.      nitroGLYCERIN (NITROLINGUAL) 0.4 MG/SPRAY spray Place 1 spray under the tongue every 5 (five) minutes x 3 doses as needed for chest pain.      sildenafil (REVATIO) 20 MG tablet Take 20 mg by mouth daily as needed for erectile dysfunction.   5    traMADol (ULTRAM) 50 MG tablet Take 1 every 4-6 hours as needed for pain not controlled by Tylenol 6 tablet 0      Allergies  Allergen Reactions   Fosamax [Alendronate Sodium]     Leg pain   Percocet [Oxycodone-Acetaminophen] Nausea And Vomiting  Past Medical History:  Diagnosis Date   Abdominal aortic aneurysm without rupture (HCC)    Anemia    takes B12 injections monthly   Anogenital (venereal) warts    Anxiety    takes Xanax daily as needed   Arthritis    back    Back pain    synovial cyst   Carpal tunnel syndrome    Celiac artery atherosclerosis    Collagen vascular disease (HCC)    Hx Lupus.   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    Physical Exam  Gen: Alert, oriented. Appears stated age.  HEENT: PERRLA. Lungs: No respiratory distress CV: RRR Abd: soft, benign, no masses Ext: No edema    Planned procedures: Proceed with EGD/colonoscopy. The patient understands the nature of the planned procedure, indications, risks, alternatives and potential complications including but not limited to bleeding, infection, perforation, damage to internal organs and possible oversedation/side effects from anesthesia. The patient agrees and gives consent to proceed.  Please refer to procedure notes for findings, recommendations and patient disposition/instructions.     Raylene Miyamoto MD, MPH Gastroenterology 05/05/2021  10:22 AM

## 2021-05-05 NOTE — Interval H&P Note (Signed)
History and Physical Interval Note:  05/05/2021 10:25 AM  Sergio Callahan  has presented today for surgery, with the diagnosis of GERD HEME +.  The various methods of treatment have been discussed with the patient and family. After consideration of risks, benefits and other options for treatment, the patient has consented to  Procedure(s): COLONOSCOPY WITH PROPOFOL (N/A) ESOPHAGOGASTRODUODENOSCOPY (EGD) WITH PROPOFOL (N/A) as a surgical intervention.  The patient's history has been reviewed, patient examined, no change in status, stable for surgery.  I have reviewed the patient's chart and labs.  Questions were answered to the patient's satisfaction.     [name]  Ok to proceed with EGD/Colonoscopy

## 2021-05-05 NOTE — Op Note (Signed)
[facility] Gastroenterology Patient Name: Sergio Callahan Procedure Date: 05/05/2021 10:18 AM MRN: [id number] Account #: [id number] Date of Birth: [birth year] Admit Type: Outpatient Age: 70 Room: [facility] ENDO ROOM 3 Gender: Male Note Status: Finalized Procedure:             Upper GI endoscopy Indications:           Gastro-esophageal reflux disease Providers:             Andrey Farmer MD, MD Medicines:             Monitored Anesthesia Care Complications:         No immediate complications. Estimated blood loss:                         Minimal. Procedure:             Pre-Anesthesia Assessment:                        - Prior to the procedure, a History and Physical was                         performed, and patient medications and allergies were                         reviewed. The patient is competent. The risks and                         benefits of the procedure and the sedation options and                         risks were discussed with the patient. All questions                         were answered and informed consent was obtained.                         Patient identification and proposed procedure were                         verified by the physician, the nurse, the anesthetist                         and the technician in the endoscopy suite. Mental                         Status Examination: alert and oriented. Airway                         Examination: normal oropharyngeal airway and neck                         mobility. Respiratory Examination: clear to                         auscultation. CV Examination: normal. Prophylactic                         Antibiotics: The patient does not require prophylactic  antibiotics. Prior Anticoagulants: The patient has                         taken no previous anticoagulant or antiplatelet                         agents. ASA Grade Assessment: III - A patient with                          severe systemic disease. After reviewing the risks and                         benefits, the patient was deemed in satisfactory                         condition to undergo the procedure. The anesthesia                         plan was to use monitored anesthesia care (MAC).                         Immediately prior to administration of medications,                         the patient was re-assessed for adequacy to receive                         sedatives. The heart rate, respiratory rate, oxygen                         saturations, blood pressure, adequacy of pulmonary                         ventilation, and response to care were monitored                         throughout the procedure. The physical status of the                         patient was re-assessed after the procedure.                        After obtaining informed consent, the endoscope was                         passed under direct vision. Throughout the procedure,                         the patient's blood pressure, pulse, and oxygen                         saturations were monitored continuously. The Endoscope                         was introduced through the mouth, and advanced to the                         second part of duodenum. The upper GI endoscopy was  accomplished without difficulty. The patient tolerated                         the procedure well. Findings:      A small hiatal hernia was present.      The exam of the esophagus was otherwise normal.      Localized moderate inflammation characterized by erythema was found in       the gastric antrum. Biopsies were taken with a cold forceps for       Helicobacter pylori testing. Estimated blood loss was minimal.      Localized mildly erythematous mucosa without bleeding was found in the       gastric fundus. Biopsies were taken with a cold forceps for histology.       Estimated blood loss was minimal.      Multiple 1 to 8 mm sessile  fundic gland polyps with a larger polyp       oozing a small amount of blood were found in the gastric body. One polyp       was sampled with a jumbo cold forceps. Resection and retrieval were       complete. The fundic gland polyp that was oozing a small amount of blood       was removed with a cold snare. Resection and retrieval were complete.       Estimated blood loss was minimal.      The examined duodenum was normal. Impression:            - Small hiatal hernia.                        - Gastritis. Biopsied.                        - Erythematous mucosa in the gastric fundus. Biopsied.                        - Multiple fundic gland polyps. Resected and retrieved.                        - Normal examined duodenum. Recommendation:        - Perform a colonoscopy today. Procedure Code(s):     --- Professional ---                        [phone], Esophagogastroduodenoscopy, flexible,                         transoral; with removal of tumor(s), polyp(s), or                         other lesion(s) by snare technique Diagnosis Code(s):     --- Professional ---                        K44.9, Diaphragmatic hernia without obstruction or                         gangrene                        K29.70, Gastritis, unspecified, without bleeding  K31.89, Other diseases of stomach and duodenum                        K31.7, Polyp of stomach and duodenum                        K21.9, Gastro-esophageal reflux disease without                         esophagitis CPT copyright 2019 [employer]. All rights reserved. The codes documented in this report are preliminary and upon coder review may  be revised to meet current compliance requirements. Andrey Farmer MD, MD 05/05/2021 11:08:52 AM Number of Addenda: 0 Note Initiated On: 05/05/2021 10:18 AM Estimated Blood Loss:  Estimated blood loss was minimal.      [facility]

## 2021-05-09 ENCOUNTER — Other Ambulatory Visit: Payer: Self-pay | Admitting: Anatomic Pathology & Clinical Pathology

## 2021-05-09 LAB — SURGICAL PATHOLOGY

## 2021-06-06 ENCOUNTER — Inpatient Hospital Stay: Payer: Medicare Other

## 2021-06-06 ENCOUNTER — Inpatient Hospital Stay: Payer: Medicare Other | Attending: Oncology | Admitting: Oncology

## 2021-06-06 ENCOUNTER — Encounter: Payer: Self-pay | Admitting: Oncology

## 2021-06-06 VITALS — BP 155/87 | HR 87 | Temp 97.6°F | Resp 16 | Ht 72.0 in | Wt 199.2 lb

## 2021-06-06 DIAGNOSIS — N6459 Other signs and symptoms in breast: Secondary | ICD-10-CM

## 2021-06-06 DIAGNOSIS — I739 Peripheral vascular disease, unspecified: Secondary | ICD-10-CM | POA: Insufficient documentation

## 2021-06-06 DIAGNOSIS — R6889 Other general symptoms and signs: Secondary | ICD-10-CM | POA: Diagnosis not present

## 2021-06-06 DIAGNOSIS — N6320 Unspecified lump in the left breast, unspecified quadrant: Secondary | ICD-10-CM | POA: Diagnosis not present

## 2021-06-06 DIAGNOSIS — I714 Abdominal aortic aneurysm, without rupture: Secondary | ICD-10-CM | POA: Diagnosis not present

## 2021-06-06 DIAGNOSIS — Z79899 Other long term (current) drug therapy: Secondary | ICD-10-CM | POA: Insufficient documentation

## 2021-06-06 DIAGNOSIS — Z87891 Personal history of nicotine dependence: Secondary | ICD-10-CM | POA: Diagnosis not present

## 2021-06-06 DIAGNOSIS — Z789 Other specified health status: Secondary | ICD-10-CM

## 2021-06-06 DIAGNOSIS — C7A8 Other malignant neuroendocrine tumors: Secondary | ICD-10-CM | POA: Diagnosis present

## 2021-06-06 DIAGNOSIS — I2510 Atherosclerotic heart disease of native coronary artery without angina pectoris: Secondary | ICD-10-CM | POA: Diagnosis not present

## 2021-06-06 DIAGNOSIS — E785 Hyperlipidemia, unspecified: Secondary | ICD-10-CM | POA: Insufficient documentation

## 2021-06-06 DIAGNOSIS — R7401 Elevation of levels of liver transaminase levels: Secondary | ICD-10-CM | POA: Diagnosis not present

## 2021-06-06 DIAGNOSIS — I10 Essential (primary) hypertension: Secondary | ICD-10-CM | POA: Insufficient documentation

## 2021-06-06 DIAGNOSIS — K219 Gastro-esophageal reflux disease without esophagitis: Secondary | ICD-10-CM | POA: Insufficient documentation

## 2021-06-06 DIAGNOSIS — M329 Systemic lupus erythematosus, unspecified: Secondary | ICD-10-CM | POA: Diagnosis not present

## 2021-06-06 DIAGNOSIS — N400 Enlarged prostate without lower urinary tract symptoms: Secondary | ICD-10-CM | POA: Insufficient documentation

## 2021-06-06 DIAGNOSIS — Z7289 Other problems related to lifestyle: Secondary | ICD-10-CM

## 2021-06-06 DIAGNOSIS — K317 Polyp of stomach and duodenum: Secondary | ICD-10-CM | POA: Diagnosis not present

## 2021-06-06 DIAGNOSIS — D649 Anemia, unspecified: Secondary | ICD-10-CM | POA: Insufficient documentation

## 2021-06-06 DIAGNOSIS — D472 Monoclonal gammopathy: Secondary | ICD-10-CM

## 2021-06-06 DIAGNOSIS — E871 Hypo-osmolality and hyponatremia: Secondary | ICD-10-CM | POA: Diagnosis not present

## 2021-06-06 DIAGNOSIS — Z7189 Other specified counseling: Secondary | ICD-10-CM | POA: Diagnosis not present

## 2021-06-06 DIAGNOSIS — Z7982 Long term (current) use of aspirin: Secondary | ICD-10-CM | POA: Insufficient documentation

## 2021-06-06 DIAGNOSIS — Z86010 Personal history of colonic polyps: Secondary | ICD-10-CM | POA: Diagnosis not present

## 2021-06-06 LAB — CBC WITH DIFFERENTIAL/PLATELET
Abs Immature Granulocytes: 0.04 10*3/uL (ref 0.00–0.07)
Basophils Absolute: 0 10*3/uL (ref 0.0–0.1)
Basophils Relative: 1 %
Eosinophils Absolute: 0.1 10*3/uL (ref 0.0–0.5)
Eosinophils Relative: 2 %
HCT: 40.8 % (ref 39.0–52.0)
Hemoglobin: 14.2 g/dL (ref 13.0–17.0)
Immature Granulocytes: 1 %
Lymphocytes Relative: 25 %
Lymphs Abs: 1 10*3/uL (ref 0.7–4.0)
MCH: 33.3 pg (ref 26.0–34.0)
MCHC: 34.8 g/dL (ref 30.0–36.0)
MCV: 95.8 fL (ref 80.0–100.0)
Monocytes Absolute: 0.3 10*3/uL (ref 0.1–1.0)
Monocytes Relative: 8 %
Neutro Abs: 2.5 10*3/uL (ref 1.7–7.7)
Neutrophils Relative %: 63 %
Platelets: 219 10*3/uL (ref 150–400)
RBC: 4.26 MIL/uL (ref 4.22–5.81)
RDW: 12.8 % (ref 11.5–15.5)
WBC: 4 10*3/uL (ref 4.0–10.5)
nRBC: 0 % (ref 0.0–0.2)

## 2021-06-06 LAB — COMPREHENSIVE METABOLIC PANEL
ALT: 45 U/L — ABNORMAL HIGH (ref 0–44)
AST: 42 U/L — ABNORMAL HIGH (ref 15–41)
Albumin: 4.1 g/dL (ref 3.5–5.0)
Alkaline Phosphatase: 51 U/L (ref 38–126)
Anion gap: 8 (ref 5–15)
BUN: 14 mg/dL (ref 8–23)
CO2: 25 mmol/L (ref 22–32)
Calcium: 9.1 mg/dL (ref 8.9–10.3)
Chloride: 94 mmol/L — ABNORMAL LOW (ref 98–111)
Creatinine, Ser: 0.73 mg/dL (ref 0.61–1.24)
GFR, Estimated: >60 mL/min (ref >60)
Glucose, Bld: 122 mg/dL — ABNORMAL HIGH (ref 70–99)
Potassium: 3.6 mmol/L (ref 3.5–5.1)
Sodium: 127 mmol/L — ABNORMAL LOW (ref 135–145)
Total Bilirubin: 1 mg/dL (ref 0.3–1.2)
Total Protein: 7 g/dL (ref 6.5–8.1)

## 2021-06-06 LAB — VITAMIN B12: Vitamin B-12: 546 pg/mL (ref 180–914)

## 2021-06-06 NOTE — Progress Notes (Signed)
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  Was referred to establish with gastroenterology. 05/05/2021, patient underwent EGD and colonoscopy by Dr. Haig Prophet. EGD showed gastritis, erythematous mucosa in the gastric fundus, multiple fundic gland polyps, resected and retrieved. Colonoscopy showed multiple colon polyps, resected and retrieved.  Diverticulosis.  Erythematous mucosa in the sigmoid colon.  CASE: [phone]  PATIENT: Sergio Callahan  Surgical Pathology Report  Specimen Submitted:  A. Stomach; cbx  B. Stomach polyp; cbx  C. Stomach, abnormal mucosa; cbx  D. Stomach polyp, inflammatory; cold snare  E. Colon polyp, rectosigmoid; cbx  F. Colon polyp, cecum; cbx  G. Colon polyp x2, transverse; cold snare (1) and cbx (1)  H. Colon, sigmoid, abnormal mucosa; cbx  I. Colon polyp, sigmoid; cbx   Clinical History: GERD, heme positive. Findings: Gastric polyps,  gastritis, colon polyps    DIAGNOSIS:  A.  STOMACH; COLD BIOPSY:  - ANTRAL-TYPE MUCOSA WITH REACTIVE FOVEOLAR HYPERPLASIA.  - OXYNTIC MUCOSA WITH PROTON PUMP INHIBITOR EFFECT.  - NEGATIVE FOR H. PYLORI, INTESTINAL METAPLASIA, DYSPLASIA, AND  MALIGNANCY.   B.  STOMACH POLYP; COLD BIOPSY:  - FUNDIC GLAND POLYP.  - NEGATIVE FOR DYSPLASIA AND MALIGNANCY.   C.  STOMACH, ABNORMAL MUCOSA; COLD BIOPSY:  - OXYNTIC-TYPE MUCOSA WITH  REACTIVE FOVEOLAR HYPERPLASIA, HEMORRHAGE,  AND STROMAL CHANGES SUGGESTIVE OF HEALED MUCOSAL INJURY.  - NEGATIVE FOR INTESTINAL METAPLASIA, DYSPLASIA, AND MALIGNANCY.   D.  STOMACH POLYP, "INFLAMMATORY"; COLD SNARE:  - WELL-DIFFERENTIATED NEUROENDOCRINE TUMOR (NET G2), AT LEAST 5 MM IN  SIZE, SEE COMMENT.  - TUMOR INVOLVES THE MUSCULARIS MUCOSAE AND EXTENDS TO THE INKED DEEP  MARGIN.   E.  COLON POLYP, RECTOSIGMOID; COLD BIOPSY:  - HYPERPLASTIC POLYP.  - NEGATIVE FOR DYSPLASIA AND MALIGNANCY.   F.  COLON POLYP, CECUM; COLD BIOPSY:  - TUBULAR ADENOMA.  - NEGATIVE FOR HIGH-GRADE DYSPLASIA AND MALIGNANCY.   G.  COLON POLYP X 2; COLD SNARE (1) AND COLD BIOPSY (1):  - TUBULAR ADENOMA.  - NEGATIVE FOR HIGH-GRADE DYSPLASIA AND MALIGNANCY.  - ONLY 1 PIECE OF TISSUE FOUND IN THE CONTAINER.   H.  SIGMOID COLON, ABNORMAL MUCOSA; COLD BIOPSY:  - COLONIC MUCOSA WITH INTACT CRYPT ARCHITECTURE.  - LYMPHOID AGGREGATES AND CONGESTION.  - NEGATIVE FOR DYSPLASIA AND MALIGNANCY.   I.  COLON POLYP, SIGMOID; COLD BIOPSY:  - HYPERPLASTIC POLYP.  - NEGATIVE FOR DYSPLASIA AND MALIGNANCY.   Patient denies any stomach discomfort today. He is on omeprazole 20 mg daily chronically.  He drinks alcohol on some days. Lupus, follows up with Dr. Jefm Bryant.  He is on Imuran and Plaquenil  Today he also reports left breast mass which he noticed about 2 weeks ago.  It is getting more tender and he is concerned.  Denies any unintentional weight loss, night sweats, diarrhea, facial flushing, fever.  Review of Systems  Constitutional:  Negative for appetite change, chills, fatigue, fever and unexpected weight change.  HENT:  Negative for hearing loss and voice change.   Eyes:  Negative for eye problems and icterus.  Respiratory:  Negative for chest tightness, cough and shortness of breath.   Cardiovascular:  Negative for chest pain and leg swelling.  Gastrointestinal:  Negative for abdominal distention and  abdominal pain.  Endocrine: Negative for hot flashes.  Genitourinary:  Negative for difficulty urinating, dysuria and frequency.   Musculoskeletal:  Negative for arthralgias.  Skin:  Negative for itching and rash.  Neurological:  Negative for light-headedness and numbness.  Hematological:  Negative for adenopathy. Does not bruise/bleed easily.  Psychiatric/Behavioral:  Negative for confusion.   Left breast tender mass MEDICAL HISTORY:  Past Medical History:  Diagnosis Date   Abdominal aortic aneurysm without rupture (HCC)    Anemia    takes B12 injections monthly   Anogenital (venereal) warts    Anxiety    takes Xanax daily as needed   Arthritis    back    Back pain    synovial cyst   Carpal tunnel syndrome    Celiac artery atherosclerosis    Collagen vascular disease (HCC)    Hx Lupus.   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    Hx of adenomatous colonic polyps    Hyperlipidemia    takes Pravastatin nightly   Hypertension    takes Lisinopril-HCTZ and Metoprolol daily   Hyponatremia    Iliac artery aneurysm (HCC)    Lupus (HCC)    takes Plaquenil and Imran daily   Nocturia    Numbness and tingling    left hip/leg/foot   Peripheral vascular disease (HCC)    Peripheral vascular disease (HCC)    Rotator cuff tendonitis    Urinary frequency     SURGICAL HISTORY: Past Surgical History:  Procedure Laterality Date   BROW LIFT Bilateral 01/27/2021   Procedure: BLEPHAROPLASTY UPPER EYELID; W/EXCESS SKIN BROW PTOSIS REPAIR AND BLEPHAROPTOSIS REPAIR; RESECT EX BILATERAL;  Surgeon: Karle Starch, MD;  Location: [place];  Service: Ophthalmology;  Laterality: Bilateral;   CARDIAC  CATHETERIZATION  2012   COLONOSCOPY     COLONOSCOPY WITH PROPOFOL N/A 09/30/2019   Procedure: COLONOSCOPY WITH PROPOFOL;  Surgeon: Robert Bellow, MD;  Location: [facility];  Service: Endoscopy;  Laterality: N/A;   COLONOSCOPY WITH PROPOFOL N/A 05/05/2021   Procedure: COLONOSCOPY WITH PROPOFOL;  Surgeon: [name], MD;  Location: [facility];  Service: Endoscopy;  Laterality: N/A;   CORONARY ANGIOPLASTY     1 stent   ESOPHAGOGASTRODUODENOSCOPY     ESOPHAGOGASTRODUODENOSCOPY (EGD) WITH PROPOFOL N/A 05/05/2021   Procedure: ESOPHAGOGASTRODUODENOSCOPY (EGD) WITH PROPOFOL;  Surgeon: [name], MD;  Location: [facility];  Service: Endoscopy;  Laterality: N/A;   LUMBAR LAMINECTOMY/DECOMPRESSION MICRODISCECTOMY Left 02/03/2015   Procedure: LUMBAR LAMINECTOMY/DECOMPRESSION MICRODISCECTOMY LEFT LUMBAR FOUR-FIVE WITH REMOVAL OF SYNOVIAL CYST;  Surgeon: Newman Pies, MD;  Location: [place] [facility];  Service: Neurosurgery;  Laterality: Left;   NASAL SEPTOPLASTY W/ TURBINOPLASTY Bilateral 10/23/2019   Procedure: NASAL SEPTOPLASTY WITH SUBMUCOUS RESECTION OF TURBINATE;  Surgeon: Beverly Gust, MD;  Location: [place];  Service: ENT;  Laterality: Bilateral;   PELVIC ANGIOGRAPHY N/A 05/02/2018  Procedure: PELVIC ANGIOGRAPHY;  Surgeon: Katha Cabal, MD;  Location: [place] [facility];  Service: Cardiovascular;  Laterality: N/A;   SHOULDER ARTHROSCOPY WITH SUBACROMIAL DECOMPRESSION AND BICEP TENDON REPAIR Right 12/04/2018   Procedure: SHOULDER ARTHROSCOPY WITH DEBRIDEMENT, DECOMPRESSION, BICEP TENODESIS;  Surgeon: Corky Mull, MD;  Location: [facility];  Service: Orthopedics;  Laterality: Right;    SOCIAL HISTORY: Social History   Socioeconomic History   Marital status: Married    Spouse name: Not on file   Number of children: Not on file   Years of education: Not on file   Highest education level: Not on file  Occupational History   Not on file   Tobacco Use   Smoking status: Former    Packs/day: 1.50    Years: 30.00    Pack years: 45.00    Types: Cigarettes    Quit date: 03/31/2021    Years since quitting: 0.1   Smokeless tobacco: Never  Vaping Use   Vaping Use: Never used  Substance and Sexual Activity   Alcohol use: Yes    Comment: cocktails about 2 daily   Drug use: No   Sexual activity: Yes  Other Topics Concern   Not on file  Social History Narrative   Not on file   Social Determinants of Health   Financial Resource Strain: Not on file  Food Insecurity: Not on file  Transportation Needs: Not on file  Physical Activity: Not on file  Stress: Not on file  Social Connections: Not on file  Intimate Partner Violence: Not on file    FAMILY HISTORY: Family History  Problem Relation Age of Onset   Leukemia Mother    Hyperlipidemia Father    Hypertension Father    Diabetes Father    Colon cancer Father    Hearing loss Father    Vascular Disease Father     ALLERGIES:  is allergic to fosamax [alendronate sodium] and percocet [oxycodone-acetaminophen].  MEDICATIONS:  Current Outpatient Medications  Medication Sig Dispense Refill   ALPRAZolam (XANAX) 0.5 MG tablet Take 0.5 mg by mouth 3 (three) times daily as needed for anxiety.      aspirin EC 81 MG tablet Take 81 mg by mouth daily.     azaTHIOprine (IMURAN) 50 MG tablet Take 125 mg by mouth daily.      cetirizine (ZYRTEC) 10 MG tablet Take 10 mg by mouth daily.     cyanocobalamin (,VITAMIN B-12,) 1000 MCG/ML injection Inject 1,000 mcg into the muscle every 14 (fourteen) days.      ELDERBERRY PO Take by mouth daily.     erythromycin ophthalmic ointment Apply to sutures 4 times a day for 10-12 days.  Discontinue if allergy develops and call our office 3.5 g 2   finasteride (PROSCAR) 5 MG tablet Take 5 mg by mouth daily.     hydrochlorothiazide (HYDRODIURIL) 25 MG tablet Take 25 mg by mouth daily.     hydroxychloroquine (PLAQUENIL) 200 MG tablet Take 200 mg  by mouth 2 (two) times daily.     ibuprofen (ADVIL,MOTRIN) 200 MG tablet Take 600 mg by mouth as needed for headache or moderate pain.      lisinopril (ZESTRIL) 40 MG tablet Take 40 mg by mouth daily.     metoprolol succinate (TOPROL-XL) 100 MG 24 hr tablet Take 100 mg by mouth at bedtime. Take with or immediately following a meal.     nicotine (NICODERM CQ - DOSED IN MG/24 HOURS) 21 mg/24hr patch Place 21 mg  onto the skin daily.     nitroGLYCERIN (NITROLINGUAL) 0.4 MG/SPRAY spray Place 1 spray under the tongue every 5 (five) minutes x 3 doses as needed for chest pain.     omeprazole (PRILOSEC) 20 MG capsule Take 20 mg by mouth daily.      pravastatin (PRAVACHOL) 40 MG tablet Take 40 mg by mouth at bedtime.     sildenafil (REVATIO) 20 MG tablet Take 20 mg by mouth daily as needed for erectile dysfunction.   5   spironolactone (ALDACTONE) 25 MG tablet Take 25 mg by mouth daily.     tamsulosin (FLOMAX) 0.4 MG CAPS capsule Take 0.4 mg by mouth daily after breakfast.     traMADol (ULTRAM) 50 MG tablet Take 1 every 4-6 hours as needed for pain not controlled by Tylenol 6 tablet 0   No current facility-administered medications for this visit.     PHYSICAL EXAMINATION: ECOG PERFORMANCE STATUS: 0 - Asymptomatic Vitals:   06/06/21 1522  BP: (!) 155/87  Pulse: 87  Resp: 16  Temp: 97.6 F (36.4 C)  SpO2: 100%   Filed Weights   06/06/21 1522  Weight: 199 lb 3.2 oz (90.4 kg)    Physical Exam Constitutional:      General: He is not in acute distress. HENT:     Head: Normocephalic and atraumatic.  Eyes:     General: No scleral icterus. Cardiovascular:     Rate and Rhythm: Normal rate and regular rhythm.     Heart sounds: Normal heart sounds.  Pulmonary:     Effort: Pulmonary effort is normal. No respiratory distress.     Breath sounds: No wheezing.  Abdominal:     General: Bowel sounds are normal. There is no distension.     Palpations: Abdomen is soft.  Musculoskeletal:         General: No deformity. Normal range of motion.     Cervical back: Normal range of motion and neck supple.  Skin:    General: Skin is warm and dry.     Findings: No erythema or rash.  Neurological:     Mental Status: He is alert and oriented to person, place, and time. Mental status is at baseline.     Cranial Nerves: No cranial nerve deficit.     Coordination: Coordination normal.  Psychiatric:        Mood and Affect: Mood normal.   Breast examination was performed.  Left breast 12/1:00 tissue thickening questionable mass, close to left nipple, tender on palpation.  No palpable right breast mass  LABORATORY DATA:  I have reviewed the data as listed Lab Results  Component Value Date   WBC 4.0 06/06/2021   HGB 14.2 06/06/2021   HCT 40.8 06/06/2021   MCV 95.8 06/06/2021   PLT 219 06/06/2021   Recent Labs    06/06/21 1624  NA 127*  K 3.6  CL 94*  CO2 25  GLUCOSE 122*  BUN 14  CREATININE 0.73  CALCIUM 9.1  GFRNONAA >60  PROT 7.0  ALBUMIN 4.1  AST 42*  ALT 45*  ALKPHOS 51  BILITOT 1.0   Iron/TIBC/Ferritin/ %Sat No results found for: IRON, TIBC, FERRITIN, IRONPCTSAT    RADIOGRAPHIC STUDIES: I have personally reviewed the radiological images as listed and agreed with the findings in the report. No results found.    ASSESSMENT & PLAN:  1. Neuroendocrine carcinoma of stomach ([place])   2. Goals of care, counseling/discussion   3. Alcohol use   4. Other  general symptoms and signs    5. MGUS (monoclonal gammopathy of unknown significance)   6. Hyponatremia   7. Other signs and symptoms in breast     #Gastric neuroendocrine carcinoma-well differentiated. Patient is on chronic PPI.  Multiple gastric polyps.  Possible type I. Check gastrin, vitamin B12 level.  Chromogranin a level, 5 HIAA quantification.  CBC and CMP Patient has been on vitamin B12 injection monthly, therefore B12 level may not be helpful in his case. Discussed about the possibility of dotatate PET  scan.  #IgG MGUS I discussed with patient about the diagnosis of IgG MGUS which is an asymptomatic condition which has a small risk of progression to smoldering multiple myeloma and to symptomatic multiple myeloma. Less frequently, these patients progress to AL amyloidosis, light chain deposition disease, or another lymphoproliferative disorder. For now I recommend observation. Check SPEP and light chain ratio now and every 6 months.  #Left breast mass, etiology unclear.  Questionable inflammation/infection versus underlying malignancy. Recommend patient to get a diagnostic mammogram for further evaluation. #Chronic hyponatremia, probably due to chronic alcohol use. #Transaminitis, pending above work-up Family history of colon cancer.  May benefit from genetic counseling.  We will further discuss with him at the next visit.  Orders Placed This Encounter  Procedures   MM DIAG BREAST TOMO BILATERAL    L breast mass 1:00 1 cm from nipple    Standing Status:   Future    Standing Expiration Date:   06/06/2022    Order Specific Question:   Reason for Exam (SYMPTOM  OR DIAGNOSIS REQUIRED)    Answer:   L breast mass    Order Specific Question:   Preferred imaging location?    Answer:   [facility]   CBC with Differential/Platelet    Standing Status:   Future    Number of Occurrences:   1    Standing Expiration Date:   06/06/2022   Comprehensive metabolic panel    Standing Status:   Future    Number of Occurrences:   1    Standing Expiration Date:   06/06/2022   Gastrin    Standing Status:   Future    Number of Occurrences:   1    Standing Expiration Date:   06/06/2022   Vitamin B12    Standing Status:   Future    Number of Occurrences:   1    Standing Expiration Date:   06/06/2022   5 HIAA, quantitative, urine, 24 hour    Standing Status:   Future    Standing Expiration Date:   06/06/2022   Chromogranin A    Standing Status:   Future    Number of Occurrences:   1    Standing  Expiration Date:   06/06/2022   Kappa/lambda light chains    Standing Status:   Future    Number of Occurrences:   1    Standing Expiration Date:   06/06/2022   Multiple Myeloma Panel (SPEP&IFE w/QIG)    Standing Status:   Future    Number of Occurrences:   1    Standing Expiration Date:   06/06/2022    All questions were answered. The patient knows to call the clinic with any problems questions or concerns.  cc Baxter Hire, MD    Return of visit: Pending above blood work studies. Thank you for this kind referral and the opportunity to participate in the care of this patient. A copy of today's note is  routed to referring provider    Earlie Server, MD, PhD Hematology Oncology [facility] at [facility] Pager- [phone] 06/06/2021

## 2021-06-07 ENCOUNTER — Other Ambulatory Visit: Payer: Self-pay

## 2021-06-07 ENCOUNTER — Telehealth: Payer: Self-pay | Admitting: Oncology

## 2021-06-07 DIAGNOSIS — N6320 Unspecified lump in the left breast, unspecified quadrant: Secondary | ICD-10-CM

## 2021-06-07 DIAGNOSIS — C7A8 Other malignant neuroendocrine tumors: Secondary | ICD-10-CM

## 2021-06-07 LAB — GASTRIN: Gastrin: 759 pg/mL — ABNORMAL HIGH (ref 0–115)

## 2021-06-07 LAB — CHROMOGRANIN A: Chromogranin A (ng/mL): 2088 ng/mL — ABNORMAL HIGH (ref 0.0–101.8)

## 2021-06-07 LAB — KAPPA/LAMBDA LIGHT CHAINS
Kappa free light chain: 16.5 mg/L (ref 3.3–19.4)
Kappa, lambda light chain ratio: 1.25 (ref 0.26–1.65)
Lambda free light chains: 13.2 mg/L (ref 5.7–26.3)

## 2021-06-07 NOTE — Telephone Encounter (Signed)
Left voicemail notifying patient of scheduled Mammogram and [place] for 8/1 at 1040am.

## 2021-06-10 LAB — MULTIPLE MYELOMA PANEL, SERUM
Albumin SerPl Elph-Mcnc: 3.8 g/dL (ref 2.9–4.4)
Albumin/Glob SerPl: 1.4 (ref 0.7–1.7)
Alpha 1: 0.2 g/dL (ref 0.0–0.4)
Alpha2 Glob SerPl Elph-Mcnc: 0.8 g/dL (ref 0.4–1.0)
B-Globulin SerPl Elph-Mcnc: 0.8 g/dL (ref 0.7–1.3)
Gamma Glob SerPl Elph-Mcnc: 1 g/dL (ref 0.4–1.8)
Globulin, Total: 2.8 g/dL (ref 2.2–3.9)
IgA: 213 mg/dL (ref 61–437)
IgG (Immunoglobin G), Serum: 1019 mg/dL (ref 603–1613)
IgM (Immunoglobulin M), Srm: 102 mg/dL (ref 20–172)
M Protein SerPl Elph-Mcnc: 0.4 g/dL — ABNORMAL HIGH
Total Protein ELP: 6.6 g/dL (ref 6.0–8.5)

## 2021-06-12 ENCOUNTER — Ambulatory Visit
Admission: RE | Admit: 2021-06-12 | Discharge: 2021-06-12 | Disposition: A | Discharge status: Home / Self Care | Payer: Medicare Other | Source: Ambulatory Visit | Attending: Oncology | Admitting: Oncology

## 2021-06-12 ENCOUNTER — Other Ambulatory Visit: Payer: Self-pay

## 2021-06-12 ENCOUNTER — Other Ambulatory Visit: Payer: Self-pay | Admitting: Oncology

## 2021-06-12 ENCOUNTER — Telehealth: Payer: Self-pay

## 2021-06-12 DIAGNOSIS — N6320 Unspecified lump in the left breast, unspecified quadrant: Secondary | ICD-10-CM

## 2021-06-12 DIAGNOSIS — R2231 Localized swelling, mass and lump, right upper limb: Secondary | ICD-10-CM | POA: Insufficient documentation

## 2021-06-12 DIAGNOSIS — E871 Hypo-osmolality and hyponatremia: Secondary | ICD-10-CM | POA: Insufficient documentation

## 2021-06-12 DIAGNOSIS — N6459 Other signs and symptoms in breast: Secondary | ICD-10-CM | POA: Insufficient documentation

## 2021-06-12 DIAGNOSIS — C7A8 Other malignant neuroendocrine tumors: Secondary | ICD-10-CM

## 2021-06-12 NOTE — Telephone Encounter (Signed)
Pt scheduled for 8/9 @ 11:00am for DOT PET

## 2021-06-12 NOTE — Telephone Encounter (Signed)
Pt scheduled to have Mammogram today.   Please schedule DOT PET and MD a few days after PET and notify pt of appt. Thanks

## 2021-06-12 NOTE — Telephone Encounter (Signed)
-----   Message from Earlie Server, MD sent at 06/11/2021  1:36 PM EDT ----- Please arrange him to get dotadate PET scan. Discussed about this possibility during visit.

## 2021-06-12 NOTE — Telephone Encounter (Signed)
Contacted [facility]. They will message me whenever scan is scheduled. Will inform patient when appt is scheduled.

## 2021-06-12 NOTE — Telephone Encounter (Signed)
Please schedule MD follow up a few days after DOT PET and notify pt of appts. Thanks

## 2021-06-13 DIAGNOSIS — N62 Hypertrophy of breast: Secondary | ICD-10-CM | POA: Insufficient documentation

## 2021-06-20 ENCOUNTER — Other Ambulatory Visit: Payer: Self-pay

## 2021-06-20 ENCOUNTER — Ambulatory Visit
Admission: RE | Admit: 2021-06-20 | Discharge: 2021-06-20 | Disposition: A | Discharge status: Home / Self Care | Payer: Medicare Other | Source: Ambulatory Visit | Attending: Oncology | Admitting: Oncology

## 2021-06-20 ENCOUNTER — Other Ambulatory Visit: Payer: PRIVATE HEALTH INSURANCE

## 2021-06-20 DIAGNOSIS — R911 Solitary pulmonary nodule: Secondary | ICD-10-CM

## 2021-06-20 DIAGNOSIS — C7A8 Other malignant neuroendocrine tumors: Secondary | ICD-10-CM | POA: Insufficient documentation

## 2021-06-20 HISTORY — DX: Solitary pulmonary nodule: R91.1

## 2021-06-20 MED ORDER — GALLIUM GA 68 DOTATATE IV KIT
4.9000 | PACK | Freq: Once | INTRAVENOUS | Status: AC | PRN
  Administered 2021-06-20: 5.3 via INTRAVENOUS

## 2021-06-23 ENCOUNTER — Inpatient Hospital Stay: Payer: Medicare Other | Attending: Oncology | Admitting: Oncology

## 2021-06-23 ENCOUNTER — Encounter: Payer: Self-pay | Admitting: Oncology

## 2021-06-23 VITALS — BP 149/91 | HR 77 | Temp 98.0°F | Resp 18 | Wt 195.2 lb

## 2021-06-23 DIAGNOSIS — Z800 Family history of malignant neoplasm of digestive organs: Secondary | ICD-10-CM | POA: Diagnosis not present

## 2021-06-23 DIAGNOSIS — Z79899 Other long term (current) drug therapy: Secondary | ICD-10-CM | POA: Diagnosis not present

## 2021-06-23 DIAGNOSIS — Z7982 Long term (current) use of aspirin: Secondary | ICD-10-CM | POA: Insufficient documentation

## 2021-06-23 DIAGNOSIS — Z86010 Personal history of colonic polyps: Secondary | ICD-10-CM | POA: Diagnosis not present

## 2021-06-23 DIAGNOSIS — C7A1 Malignant poorly differentiated neuroendocrine tumors: Secondary | ICD-10-CM | POA: Insufficient documentation

## 2021-06-23 DIAGNOSIS — I10 Essential (primary) hypertension: Secondary | ICD-10-CM | POA: Insufficient documentation

## 2021-06-23 DIAGNOSIS — Z789 Other specified health status: Secondary | ICD-10-CM

## 2021-06-23 DIAGNOSIS — I739 Peripheral vascular disease, unspecified: Secondary | ICD-10-CM | POA: Insufficient documentation

## 2021-06-23 DIAGNOSIS — R7401 Elevation of levels of liver transaminase levels: Secondary | ICD-10-CM | POA: Insufficient documentation

## 2021-06-23 DIAGNOSIS — D472 Monoclonal gammopathy: Secondary | ICD-10-CM | POA: Diagnosis not present

## 2021-06-23 DIAGNOSIS — C7A8 Other malignant neuroendocrine tumors: Secondary | ICD-10-CM

## 2021-06-23 DIAGNOSIS — N400 Enlarged prostate without lower urinary tract symptoms: Secondary | ICD-10-CM | POA: Diagnosis not present

## 2021-06-23 DIAGNOSIS — I2510 Atherosclerotic heart disease of native coronary artery without angina pectoris: Secondary | ICD-10-CM | POA: Diagnosis not present

## 2021-06-23 DIAGNOSIS — E785 Hyperlipidemia, unspecified: Secondary | ICD-10-CM | POA: Insufficient documentation

## 2021-06-23 DIAGNOSIS — N62 Hypertrophy of breast: Secondary | ICD-10-CM | POA: Insufficient documentation

## 2021-06-23 DIAGNOSIS — Z7289 Other problems related to lifestyle: Secondary | ICD-10-CM

## 2021-06-23 DIAGNOSIS — E871 Hypo-osmolality and hyponatremia: Secondary | ICD-10-CM | POA: Diagnosis not present

## 2021-06-23 DIAGNOSIS — K219 Gastro-esophageal reflux disease without esophagitis: Secondary | ICD-10-CM | POA: Insufficient documentation

## 2021-06-23 NOTE — Progress Notes (Signed)
Patient here for follow up. No new concerns voiced.  °

## 2021-06-23 NOTE — Progress Notes (Signed)
Hematology/Oncology Consult note [facility] Telephone:([phone] [phone]   Patient Care Team: Baxter Hire, MD as PCP - General (Internal Medicine) Clent Jacks, RN as Oncology Nurse Navigator  REFERRING PROVIDER: Baxter Hire, MD  CHIEF COMPLAINTS/REASON FOR VISIT:  gastric neuroendocrine carcinoma  HISTORY OF PRESENTING ILLNESS:   Sergio Callahan is a  70 y.o.  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  Was referred to establish with gastroenterology. 05/05/2021, patient underwent EGD and colonoscopy by Dr. Haig Prophet. EGD showed gastritis, erythematous mucosa in the gastric fundus, multiple fundic gland polyps, resected and retrieved. Colonoscopy showed multiple colon polyps, resected and retrieved.  Diverticulosis.  Erythematous mucosa in the sigmoid colon.  CASE: [id number]  PATIENT: Sergio Callahan  Surgical Pathology Report  Specimen Submitted:  A. Stomach; cbx  B. Stomach polyp; cbx  C. Stomach, abnormal mucosa; cbx  D. Stomach polyp, inflammatory; cold snare  E. Colon polyp, rectosigmoid; cbx  F. Colon polyp, cecum; cbx  G. Colon polyp x2, transverse; cold snare (1) and cbx (1)  H. Colon, sigmoid, abnormal mucosa; cbx  I. Colon polyp, sigmoid; cbx   Clinical History: GERD, heme positive. Findings: Gastric polyps,  gastritis, colon polyps    DIAGNOSIS:  A.  STOMACH; COLD BIOPSY:  - ANTRAL-TYPE MUCOSA WITH REACTIVE FOVEOLAR HYPERPLASIA.  - OXYNTIC MUCOSA WITH PROTON PUMP INHIBITOR EFFECT.  - NEGATIVE FOR H. PYLORI, INTESTINAL METAPLASIA, DYSPLASIA, AND  MALIGNANCY.   B.  STOMACH POLYP; COLD BIOPSY:  - FUNDIC GLAND POLYP.  - NEGATIVE FOR DYSPLASIA AND MALIGNANCY.   C.  STOMACH, ABNORMAL MUCOSA; COLD BIOPSY:  - OXYNTIC-TYPE MUCOSA WITH REACTIVE FOVEOLAR  HYPERPLASIA, HEMORRHAGE,  AND STROMAL CHANGES SUGGESTIVE OF HEALED MUCOSAL INJURY.  - NEGATIVE FOR INTESTINAL METAPLASIA, DYSPLASIA, AND MALIGNANCY.   D.  STOMACH POLYP, "INFLAMMATORY"; COLD SNARE:  - WELL-DIFFERENTIATED NEUROENDOCRINE TUMOR (NET G2), AT LEAST 5 MM IN  SIZE, SEE COMMENT.  - TUMOR INVOLVES THE MUSCULARIS MUCOSAE AND EXTENDS TO THE INKED DEEP  MARGIN.   E.  COLON POLYP, RECTOSIGMOID; COLD BIOPSY:  - HYPERPLASTIC POLYP.  - NEGATIVE FOR DYSPLASIA AND MALIGNANCY.   F.  COLON POLYP, CECUM; COLD BIOPSY:  - TUBULAR ADENOMA.  - NEGATIVE FOR HIGH-GRADE DYSPLASIA AND MALIGNANCY.   G.  COLON POLYP X 2; COLD SNARE (1) AND COLD BIOPSY (1):  - TUBULAR ADENOMA.  - NEGATIVE FOR HIGH-GRADE DYSPLASIA AND MALIGNANCY.  - ONLY 1 PIECE OF TISSUE FOUND IN THE CONTAINER.   H.  SIGMOID COLON, ABNORMAL MUCOSA; COLD BIOPSY:  - COLONIC MUCOSA WITH INTACT CRYPT ARCHITECTURE.  - LYMPHOID AGGREGATES AND CONGESTION.  - NEGATIVE FOR DYSPLASIA AND MALIGNANCY.   I.  COLON POLYP, SIGMOID; COLD BIOPSY:  - HYPERPLASTIC POLYP.  - NEGATIVE FOR DYSPLASIA AND MALIGNANCY.   Patient denies any stomach discomfort today. He is on omeprazole 20 mg daily chronically.  He drinks alcohol on some days. Lupus, follows up with Dr. Jefm Bryant.  He is on Imuran and Plaquenil  Today he also reports left breast mass which he noticed about 2 weeks ago.  It is getting more tender and he is concerned.  Denies any unintentional weight loss, night sweats, diarrhea, facial flushing, fever.   INTERVAL HISTORY Sergio Callahan is a 70 y.o. male who has above history reviewed by me today presents for follow  up visit for management of gastric neuroendocrine carcinoma Problems and complaints are listed below:  No new symptoms.  During the interval he has had bilateral mammogram for work up of left breast tenderness.  Also had Dotatate PET scan done. Presents to discuss results.    Review of Systems  Constitutional:   Negative for appetite change, chills, fatigue, fever and unexpected weight change.  HENT:   Negative for hearing loss and voice change.   Eyes:  Negative for eye problems and icterus.  Respiratory:  Negative for chest tightness, cough and shortness of breath.   Cardiovascular:  Negative for chest pain and leg swelling.  Gastrointestinal:  Negative for abdominal distention and abdominal pain.  Endocrine: Negative for hot flashes.  Genitourinary:  Negative for difficulty urinating, dysuria and frequency.   Musculoskeletal:  Negative for arthralgias.  Skin:  Negative for itching and rash.  Neurological:  Negative for light-headedness and numbness.  Hematological:  Negative for adenopathy. Does not bruise/bleed easily.  Psychiatric/Behavioral:  Negative for confusion.   Left breast tender mass MEDICAL HISTORY:  Past Medical History:  Diagnosis Date   Abdominal aortic aneurysm without rupture (HCC)    Anemia    takes B12 injections monthly   Anogenital (venereal) warts    Anxiety    takes Xanax daily as needed   Arthritis    back    Back pain    synovial cyst   Carpal tunnel syndrome    Celiac artery atherosclerosis    Collagen vascular disease (HCC)    Hx Lupus.   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    Hx of adenomatous colonic polyps    Hyperlipidemia    takes Pravastatin nightly   Hypertension    takes Lisinopril-HCTZ and Metoprolol daily   Hyponatremia    Iliac artery aneurysm (HCC)    Lupus (HCC)    takes Plaquenil and Imran daily   Nocturia    Numbness and tingling    left hip/leg/foot   Peripheral vascular disease (HCC)    Peripheral vascular disease (HCC)    Rotator cuff tendonitis     Urinary frequency     SURGICAL HISTORY: Past Surgical History:  Procedure Laterality Date   BROW LIFT Bilateral 01/27/2021   Procedure: BLEPHAROPLASTY UPPER EYELID; W/EXCESS SKIN BROW PTOSIS REPAIR AND BLEPHAROPTOSIS REPAIR; RESECT EX BILATERAL;  Surgeon: Karle Starch, MD;  Location: [place];  Service: Ophthalmology;  Laterality: Bilateral;   CARDIAC CATHETERIZATION  2012   COLONOSCOPY     COLONOSCOPY WITH PROPOFOL N/A 09/30/2019   Procedure: COLONOSCOPY WITH PROPOFOL;  Surgeon: Robert Bellow, MD;  Location: [facility];  Service: Endoscopy;  Laterality: N/A;   COLONOSCOPY WITH PROPOFOL N/A 05/05/2021   Procedure: COLONOSCOPY WITH PROPOFOL;  Surgeon: [name], MD;  Location: [facility];  Service: Endoscopy;  Laterality: N/A;   CORONARY ANGIOPLASTY     1 stent   ESOPHAGOGASTRODUODENOSCOPY     ESOPHAGOGASTRODUODENOSCOPY (EGD) WITH PROPOFOL N/A 05/05/2021   Procedure: ESOPHAGOGASTRODUODENOSCOPY (EGD) WITH PROPOFOL;  Surgeon: [name], MD;  Location: [facility];  Service: Endoscopy;  Laterality: N/A;   LUMBAR LAMINECTOMY/DECOMPRESSION MICRODISCECTOMY Left 02/03/2015  Procedure: LUMBAR LAMINECTOMY/DECOMPRESSION MICRODISCECTOMY LEFT LUMBAR FOUR-FIVE WITH REMOVAL OF SYNOVIAL CYST;  Surgeon: Newman Pies, MD;  Location: [place] [facility];  Service: Neurosurgery;  Laterality: Left;   NASAL SEPTOPLASTY W/ TURBINOPLASTY Bilateral 10/23/2019   Procedure: NASAL SEPTOPLASTY WITH SUBMUCOUS RESECTION OF TURBINATE;  Surgeon: Beverly Gust, MD;  Location: [place];  Service: ENT;  Laterality: Bilateral;   PELVIC ANGIOGRAPHY N/A 05/02/2018   Procedure: PELVIC ANGIOGRAPHY;  Surgeon: Katha Cabal, MD;  Location: [place] [facility];  Service: Cardiovascular;  Laterality: N/A;   SHOULDER ARTHROSCOPY WITH SUBACROMIAL DECOMPRESSION AND BICEP TENDON REPAIR Right 12/04/2018   Procedure: SHOULDER ARTHROSCOPY WITH DEBRIDEMENT, DECOMPRESSION, BICEP  TENODESIS;  Surgeon: Corky Mull, MD;  Location: [facility];  Service: Orthopedics;  Laterality: Right;    SOCIAL HISTORY: Social History   Socioeconomic History   Marital status: Married    Spouse name: Not on file   Number of children: Not on file   Years of education: Not on file   Highest education level: Not on file  Occupational History   Not on file  Tobacco Use   Smoking status: Former    Packs/day: 1.50    Years: 30.00    Pack years: 45.00    Types: Cigarettes    Quit date: 03/31/2021    Years since quitting: 0.2   Smokeless tobacco: Never  Vaping Use   Vaping Use: Never used  Substance and Sexual Activity   Alcohol use: Yes    Comment: cocktails about 2 daily   Drug use: No   Sexual activity: Yes  Other Topics Concern   Not on file  Social History Narrative   Not on file   Social Determinants of Health   Financial Resource Strain: Not on file  Food Insecurity: Not on file  Transportation Needs: Not on file  Physical Activity: Not on file  Stress: Not on file  Social Connections: Not on file  Intimate Partner Violence: Not on file    FAMILY HISTORY: Family History  Problem Relation Age of Onset   Leukemia Mother    Hyperlipidemia Father    Hypertension Father    Diabetes Father    Colon cancer Father    Hearing loss Father    Vascular Disease Father     ALLERGIES:  is allergic to fosamax [alendronate sodium] and percocet [oxycodone-acetaminophen].  MEDICATIONS:  Current Outpatient Medications  Medication Sig Dispense Refill   ALPRAZolam (XANAX) 0.5 MG tablet Take 0.5 mg by mouth 3 (three) times daily as needed for anxiety.      aspirin EC 81 MG tablet Take 81 mg by mouth daily.     azaTHIOprine (IMURAN) 50 MG tablet Take 125 mg by mouth daily.      cetirizine (ZYRTEC) 10 MG tablet Take 10 mg by mouth daily.     cyanocobalamin (,VITAMIN B-12,) 1000 MCG/ML injection Inject 1,000 mcg into the muscle every 14 (fourteen) days.      ELDERBERRY PO  Take by mouth daily.     erythromycin ophthalmic ointment Apply to sutures 4 times a day for 10-12 days.  Discontinue if allergy develops and call our office 3.5 g 2   finasteride (PROSCAR) 5 MG tablet Take 5 mg by mouth daily.     hydrochlorothiazide (HYDRODIURIL) 25 MG tablet Take 25 mg by mouth daily.     hydroxychloroquine (PLAQUENIL) 200 MG tablet Take 200 mg by mouth 2 (two) times daily.     ibuprofen (ADVIL,MOTRIN) 200 MG tablet Take 600  mg by mouth as needed for headache or moderate pain.      lisinopril (ZESTRIL) 40 MG tablet Take 40 mg by mouth daily.     metoprolol succinate (TOPROL-XL) 100 MG 24 hr tablet Take 100 mg by mouth at bedtime. Take with or immediately following a meal.     nicotine (NICODERM CQ - DOSED IN MG/24 HOURS) 21 mg/24hr patch Place 21 mg onto the skin daily.     omeprazole (PRILOSEC) 20 MG capsule Take 20 mg by mouth daily.      pravastatin (PRAVACHOL) 40 MG tablet Take 40 mg by mouth at bedtime.     sildenafil (REVATIO) 20 MG tablet Take 20 mg by mouth daily as needed for erectile dysfunction.   5   tamsulosin (FLOMAX) 0.4 MG CAPS capsule Take 0.4 mg by mouth daily after breakfast.     traMADol (ULTRAM) 50 MG tablet Take 1 every 4-6 hours as needed for pain not controlled by Tylenol 6 tablet 0   nitroGLYCERIN (NITROLINGUAL) 0.4 MG/SPRAY spray Place 1 spray under the tongue every 5 (five) minutes x 3 doses as needed for chest pain. (Patient not taking: Reported on 06/23/2021)     No current facility-administered medications for this visit.     PHYSICAL EXAMINATION: ECOG PERFORMANCE STATUS: 0 - Asymptomatic Vitals:   06/23/21 0926  BP: (!) 149/91  Pulse: 77  Resp: 18  Temp: 98 F (36.7 C)   Filed Weights   06/23/21 0926  Weight: 195 lb 3.2 oz (88.5 kg)    Physical Exam Constitutional:      General: He is not in acute distress. HENT:     Head: Normocephalic and atraumatic.  Eyes:     General: No scleral icterus. Cardiovascular:     Rate and  Rhythm: Normal rate and regular rhythm.     Heart sounds: Normal heart sounds.  Pulmonary:     Effort: Pulmonary effort is normal. No respiratory distress.     Breath sounds: No wheezing.  Abdominal:     General: Bowel sounds are normal. There is no distension.     Palpations: Abdomen is soft.  Musculoskeletal:        General: No deformity. Normal range of motion.     Cervical back: Normal range of motion and neck supple.  Skin:    General: Skin is warm and dry.     Findings: No erythema or rash.  Neurological:     Mental Status: He is alert and oriented to person, place, and time. Mental status is at baseline.     Cranial Nerves: No cranial nerve deficit.     Coordination: Coordination normal.  Psychiatric:        Mood and Affect: Mood normal.   Breast examination was performed.  Left breast 12/1:00 tissue thickening questionable mass, close to left nipple, tender on palpation.  No palpable right breast mass  LABORATORY DATA:  I have reviewed the data as listed Lab Results  Component Value Date   WBC 4.0 06/06/2021   HGB 14.2 06/06/2021   HCT 40.8 06/06/2021   MCV 95.8 06/06/2021   PLT 219 06/06/2021   Recent Labs    06/06/21 1624  NA 127*  K 3.6  CL 94*  CO2 25  GLUCOSE 122*  BUN 14  CREATININE 0.73  CALCIUM 9.1  GFRNONAA >60  PROT 7.0  ALBUMIN 4.1  AST 42*  ALT 45*  ALKPHOS 51  BILITOT 1.0    Iron/TIBC/Ferritin/ %Sat No results found  for: IRON, TIBC, FERRITIN, IRONPCTSAT    RADIOGRAPHIC STUDIES: I have personally reviewed the radiological images as listed and agreed with the findings in the report. US BREAST LTD UNI RIGHT INC AXILLA  Result Date: 06/12/2021 CLINICAL DATA:  70 year old male presenting with a new lump and tenderness in the retroareolar left breast for approximately 3 weeks. Recently diagnosed with gastric neuroendocrine tumor. EXAM: DIGITAL DIAGNOSTIC BILATERAL MAMMOGRAM WITH TOMOSYNTHESIS AND CAD; ULTRASOUND RIGHT BREAST LIMITED  TECHNIQUE: Bilateral digital diagnostic mammography and breast tomosynthesis was performed. The images were evaluated with computer-aided detection.; Targeted ultrasound examination of the right breast was performed COMPARISON:  None. ACR Breast Density Category b: There are scattered areas of fibroglandular density. FINDINGS: Mammogram: Right breast: No suspicious mass, distortion, or microcalcifications are identified to suggest presence of malignancy. There is a very small amount of glandular tissue in the retroareolar aspect of the breast. In the right axilla there is a prominent lymph node. Left breast: A skin BB marks the site of concern reported by the patient in the retroareolar left breast. A spot tangential view of this area is performed in addition to standard views. There is a mild amount of glandular tissue in the retroareolar aspect of the breast. On physical exam at site of concern reported by the patient in the retroareolar left breast I feel a firm area but no fixed discrete mass. There are no obvious skin changes. Ultrasound: Targeted ultrasound performed of the right axilla demonstrating a prominent lymph node with normal morphology and retained fatty hilum with cortical thickness measuring 0.3 cm, within normal limits. This corresponds to the mammographic finding. IMPRESSION: 1.  Benign left greater than right gynecomastia. 2.  Normal right axillary ultrasound. RECOMMENDATION: Clinical follow-up as needed for gynecomastia. I discussed with the patient the fact that gynecomastia can occur in men as testosterone levels decrease with age or in younger men with low testosterone levels, causing a change in the serum testosterone:estrogen ratio. We also discussed other potential etiologies of gynecomastia including numerous prescription medications and recreational drugs (marijuana and anabolic steroids in particular). Approximately 20% of cases of gynecomastia are idiopathic. We also discussed the  possibility of surgical excision if symptoms continue and if an etiology of the gynecomastia cannot be determined and therefore corrected. BI-RADS CATEGORY  2: Benign. Electronically Signed   By: Audie Pinto M.D.   On: 06/12/2021 11:29  MM DIAG BREAST TOMO BILATERAL  Result Date: 06/12/2021 CLINICAL DATA:  70 year old male presenting with a new lump and tenderness in the retroareolar left breast for approximately 3 weeks. Recently diagnosed with gastric neuroendocrine tumor. EXAM: DIGITAL DIAGNOSTIC BILATERAL MAMMOGRAM WITH TOMOSYNTHESIS AND CAD; ULTRASOUND RIGHT BREAST LIMITED TECHNIQUE: Bilateral digital diagnostic mammography and breast tomosynthesis was performed. The images were evaluated with computer-aided detection.; Targeted ultrasound examination of the right breast was performed COMPARISON:  None. ACR Breast Density Category b: There are scattered areas of fibroglandular density. FINDINGS: Mammogram: Right breast: No suspicious mass, distortion, or microcalcifications are identified to suggest presence of malignancy. There is a very small amount of glandular tissue in the retroareolar aspect of the breast. In the right axilla there is a prominent lymph node. Left breast: A skin BB marks the site of concern reported by the patient in the retroareolar left breast. A spot tangential view of this area is performed in addition to standard views. There is a mild amount of glandular tissue in the retroareolar aspect of the breast. On physical exam at site of concern reported by the  patient in the retroareolar left breast I feel a firm area but no fixed discrete mass. There are no obvious skin changes. Ultrasound: Targeted ultrasound performed of the right axilla demonstrating a prominent lymph node with normal morphology and retained fatty hilum with cortical thickness measuring 0.3 cm, within normal limits. This corresponds to the mammographic finding. IMPRESSION: 1.  Benign left greater than right  gynecomastia. 2.  Normal right axillary ultrasound. RECOMMENDATION: Clinical follow-up as needed for gynecomastia. I discussed with the patient the fact that gynecomastia can occur in men as testosterone levels decrease with age or in younger men with low testosterone levels, causing a change in the serum testosterone:estrogen ratio. We also discussed other potential etiologies of gynecomastia including numerous prescription medications and recreational drugs (marijuana and anabolic steroids in particular). Approximately 20% of cases of gynecomastia are idiopathic. We also discussed the possibility of surgical excision if symptoms continue and if an etiology of the gynecomastia cannot be determined and therefore corrected. BI-RADS CATEGORY  2: Benign. Electronically Signed   By: Audie Pinto M.D.   On: 06/12/2021 11:29  NM PET (NETSPOT GA 25 DOTATATE) SKULL BASE TO MID THIGH  Result Date: 06/20/2021 CLINICAL DATA:  Well differentiated neuroendocrine tumor of the stomach. EXAM: NUCLEAR MEDICINE PET SKULL BASE TO THIGH TECHNIQUE: 5.3 mCi gallium 68 DOTATATE was injected intravenously. Full-ring PET imaging was performed from the skull base to thigh after the radiotracer. CT data was obtained and used for attenuation correction and anatomic localization. COMPARISON:  None available FINDINGS: NECK No radiotracer activity in neck lymph nodes. Incidental CT findings: None CHEST No radiotracer accumulation within mediastinal or hilar lymph nodes. No suspicious pulmonary nodules on the CT scan. Incidental CT finding:None ABDOMEN/PELVIS There is diffuse physiologic radiotracer activity within the stomach without focality. No abnormal radiotracer activity in upper abdominal lymph or nodes. No distant abnormal radiotracer activity in the liver. Physiologic activity noted in the liver, spleen, adrenal glands and kidneys. Incidental CT findings:Atherosclerotic calcification of the aorta. SKELETON No focal activity to  suggest skeletal metastasis. Incidental CT findings:None IMPRESSION: 1. No focal radiotracer activity within stomach to localize well differentiated neuroendocrine tumor. Diffuse physiologic activity throughout the stomach. 2. No evidence of well differentiated metastatic neuroendocrine tumor or adenopathy. Electronically Signed   By: Suzy Bouchard M.D.   On: 06/20/2021 12:48      ASSESSMENT & PLAN:  1. Neuroendocrine carcinoma of stomach ([place])   2. MGUS (monoclonal gammopathy of unknown significance)   3. Hyponatremia   4. Alcohol use   5. Gynecomastia   6. Transaminitis    #Gastric neuroendocrine carcinoma-well differentiated, Grade 2. Patient is on chronic PPI.  Multiple gastric polyps.   Patient has been on vitamin B12 injection monthly, therefore B12 level may not be helpful in his case. Significantly elevated chromogranin A level.  dotatate PET scan showed no lymph node involvement and distant metastasis.  I discussed with pathologist Dr.Olney. Gastric biopsies collected 05/05/21 are negative for intestinal metaplasia. There is proton pump inhibitor effect and a fundic gland polyp, both of which represent INCREASED volume of oxyntic glands, no evidence of oxyntic atrophy as would be seen in atrophic gastritis  chromogranin A can be increased in lupus  Gastrin is elevated, will repeat another level after holding PPI for 2 weeks.  I wonder if his gastric NET is due to the hypergastrinemia secondary to prolonged use of PPI, like Type 1, without atrophic gastritis.    #IgG MGUS recommend observation. Check SPEP and light chain  ratio now and every 6 months.  #Left breast tenderness is likely due to gynecomastia, possibly due to ACE inhibitor. diagnostic mammogram findings were discussed with patient.   Marland Kitchen#Chronic hyponatremia, probably due to chronic alcohol use. #Transaminitis, probabaly due to ETOH/fatty liver disease.  Family history of colon cancer.  May benefit from genetic  counseling.  We will further discuss with him at the next visit.  Orders Placed This Encounter  Procedures   Gastrin    Standing Status:   Future    Standing Expiration Date:   06/23/2022    All questions were answered. The patient knows to call the clinic with any problems questions or concerns.  cc Baxter Hire, MD    Return of visit: Pending above blood work studies.   Earlie Server, MD, PhD Hematology Oncology [place] [facility] 06/23/2021

## 2021-07-10 ENCOUNTER — Inpatient Hospital Stay: Payer: Medicare Other

## 2021-07-10 DIAGNOSIS — C7A8 Other malignant neuroendocrine tumors: Secondary | ICD-10-CM

## 2021-07-10 DIAGNOSIS — C7A1 Malignant poorly differentiated neuroendocrine tumors: Secondary | ICD-10-CM | POA: Diagnosis not present

## 2021-07-11 DIAGNOSIS — C7A1 Malignant poorly differentiated neuroendocrine tumors: Secondary | ICD-10-CM | POA: Diagnosis not present

## 2021-07-12 ENCOUNTER — Other Ambulatory Visit: Payer: Self-pay

## 2021-07-12 DIAGNOSIS — Z789 Other specified health status: Secondary | ICD-10-CM

## 2021-07-12 DIAGNOSIS — C7A8 Other malignant neuroendocrine tumors: Secondary | ICD-10-CM

## 2021-07-12 DIAGNOSIS — Z7189 Other specified counseling: Secondary | ICD-10-CM

## 2021-07-12 DIAGNOSIS — Z7289 Other problems related to lifestyle: Secondary | ICD-10-CM

## 2021-07-12 LAB — GASTRIN: Gastrin: 76 pg/mL (ref 0–115)

## 2021-07-13 ENCOUNTER — Ambulatory Visit: Payer: Medicare Other | Admitting: Urology

## 2021-07-18 ENCOUNTER — Telehealth: Payer: Self-pay

## 2021-07-18 LAB — 5 HIAA, QUANTITATIVE, URINE, 24 HOUR
5-HIAA, Ur: 2.3 mg/L
5-HIAA,Quant.,24 Hr Urine: 4.4 mg/24 hr (ref 0.0–14.9)
Total Volume: 1900

## 2021-07-18 NOTE — Progress Notes (Signed)
Mychart message sent to pt. Will send referral once he tell me he agrees.

## 2021-07-18 NOTE — Telephone Encounter (Signed)
-----   Message from Earlie Server, MD sent at 07/17/2021  3:04 PM EDT ----- Please let him know that gastrin level is normal after you stopped PPI.  I think the gastric neuroendocrine carcinoma is a Type 1 like neuroendocrine tumor caused by the increased gastrin due to PPI.  You may resume PPI if you need to for your gastric reflux disease. I recommend you to see Dr.Masouraty for evaluation of EUS as the tumor extend to the deep margin and resect if any residue tumor.  Thanks.  If he agrees, please arrange thanks.

## 2021-07-18 NOTE — Telephone Encounter (Signed)
Called pt and no answer. Detailed Mychart message sent to pt. Asked him to let me know if he is agreeable to being referred to Dr. Allison Quarry.

## 2021-07-19 ENCOUNTER — Other Ambulatory Visit: Payer: Self-pay

## 2021-07-19 DIAGNOSIS — C7A8 Other malignant neuroendocrine tumors: Secondary | ICD-10-CM

## 2021-07-19 NOTE — Telephone Encounter (Signed)
Patient agreeable to referral. Referral to Dr. Donneta Romberg office has been faxed by Silverio Lay.

## 2021-07-19 NOTE — Progress Notes (Signed)
Referral sent to Dr. Rush Landmark.

## 2021-07-25 NOTE — Telephone Encounter (Signed)
Please advise 

## 2021-07-26 ENCOUNTER — Other Ambulatory Visit: Payer: Self-pay

## 2021-07-26 ENCOUNTER — Telehealth: Payer: Self-pay

## 2021-07-26 DIAGNOSIS — C7A8 Other malignant neuroendocrine tumors: Secondary | ICD-10-CM

## 2021-07-26 NOTE — Telephone Encounter (Signed)
EUS-EMR-EGD scheduled, pt instructed and medications reviewed.  Patient instructions mailed to home.  Patient to call with any questions or concerns.

## 2021-07-26 NOTE — Telephone Encounter (Signed)
Appt has been changed to 145 pm instructions altered and mailed.

## 2021-07-26 NOTE — Telephone Encounter (Signed)
The pt has been scheduled for EGD, EMR, EUS on 08/28/21 at 1130 am with [facility] at [facility]

## 2021-07-26 NOTE — Telephone Encounter (Signed)
-----   Message from Irving Copas., MD sent at 07/25/2021  5:03 PM EDT ----- Regarding: RE: needed EUS Storm Dulski, Please move forward with scheduling this patient with an EGD/EUS with EMR 75-minute case with me to evaluate the stomach and the prior polypectomy site to see if anything is persisting and resect anything left due to the lesion going to the margin of the resection. Thanks. GM  ----- Message ----- From: Timothy Lasso, RN Sent: 07/25/2021   9:22 AM EDT To: Milus Banister, MD, Clent Jacks, RN, # Subject: Melton Alar: needed EUS                                 Please review for EUS thanks  ----- Message ----- From: Clent Jacks, RN Sent: 07/25/2021   9:17 AM EDT To: Timothy Lasso, RN Subject: needed EUS                                     Hi Jiyaan Steinhauser, I was hoping you could look into the referrel we sent for him. He has a NET and  needs an EUS to assess for any further  intervention.

## 2021-08-17 ENCOUNTER — Encounter (HOSPITAL_COMMUNITY): Payer: Self-pay | Admitting: Gastroenterology

## 2021-08-21 DIAGNOSIS — Z23 Encounter for immunization: Secondary | ICD-10-CM | POA: Diagnosis not present

## 2021-08-28 ENCOUNTER — Ambulatory Visit (HOSPITAL_COMMUNITY)
Admission: RE | Admit: 2021-08-28 | Discharge: 2021-08-28 | Disposition: A | Discharge status: Home / Self Care | Payer: Medicare Other | Attending: Gastroenterology | Admitting: Gastroenterology

## 2021-08-28 ENCOUNTER — Other Ambulatory Visit: Payer: Self-pay

## 2021-08-28 ENCOUNTER — Encounter (HOSPITAL_COMMUNITY): Payer: Self-pay | Admitting: Gastroenterology

## 2021-08-28 ENCOUNTER — Ambulatory Visit (HOSPITAL_COMMUNITY): Payer: Medicare Other | Admitting: Certified Registered"

## 2021-08-28 ENCOUNTER — Encounter (HOSPITAL_COMMUNITY)
Admission: RE | Disposition: A | Discharge status: Home / Self Care | Payer: Self-pay | Source: Home / Self Care | Attending: Gastroenterology

## 2021-08-28 DIAGNOSIS — Z87891 Personal history of nicotine dependence: Secondary | ICD-10-CM | POA: Diagnosis not present

## 2021-08-28 DIAGNOSIS — Z885 Allergy status to narcotic agent status: Secondary | ICD-10-CM | POA: Insufficient documentation

## 2021-08-28 DIAGNOSIS — Z888 Allergy status to other drugs, medicaments and biological substances status: Secondary | ICD-10-CM | POA: Insufficient documentation

## 2021-08-28 DIAGNOSIS — K317 Polyp of stomach and duodenum: Secondary | ICD-10-CM | POA: Diagnosis not present

## 2021-08-28 DIAGNOSIS — K3189 Other diseases of stomach and duodenum: Secondary | ICD-10-CM

## 2021-08-28 DIAGNOSIS — K449 Diaphragmatic hernia without obstruction or gangrene: Secondary | ICD-10-CM | POA: Insufficient documentation

## 2021-08-28 DIAGNOSIS — K2289 Other specified disease of esophagus: Secondary | ICD-10-CM | POA: Insufficient documentation

## 2021-08-28 DIAGNOSIS — D3A8 Other benign neuroendocrine tumors: Secondary | ICD-10-CM | POA: Diagnosis present

## 2021-08-28 DIAGNOSIS — K2950 Unspecified chronic gastritis without bleeding: Secondary | ICD-10-CM | POA: Insufficient documentation

## 2021-08-28 DIAGNOSIS — C7A8 Other malignant neuroendocrine tumors: Secondary | ICD-10-CM

## 2021-08-28 HISTORY — PX: EUS: SHX5427

## 2021-08-28 HISTORY — PX: POLYPECTOMY: SHX5525

## 2021-08-28 HISTORY — PX: ESOPHAGOGASTRODUODENOSCOPY (EGD) WITH PROPOFOL: SHX5813

## 2021-08-28 HISTORY — PX: SUBMUCOSAL INJECTION: SHX5543

## 2021-08-28 HISTORY — PX: BIOPSY: SHX5522

## 2021-08-28 LAB — POCT I-STAT, CHEM 8
BUN: 19 mg/dL (ref 8–23)
Calcium, Ion: 1.17 mmol/L (ref 1.15–1.40)
Chloride: 102 mmol/L (ref 98–111)
Creatinine, Ser: 0.8 mg/dL (ref 0.61–1.24)
Glucose, Bld: 92 mg/dL (ref 70–99)
HCT: 47 % (ref 39.0–52.0)
Hemoglobin: 16 g/dL (ref 13.0–17.0)
Potassium: 4.4 mmol/L (ref 3.5–5.1)
Sodium: 134 mmol/L — ABNORMAL LOW (ref 135–145)
TCO2: 23 mmol/L (ref 22–32)

## 2021-08-28 SURGERY — ESOPHAGOGASTRODUODENOSCOPY (EGD) WITH PROPOFOL
Anesthesia: Monitor Anesthesia Care

## 2021-08-28 MED ORDER — PROPOFOL 1000 MG/100ML IV EMUL
INTRAVENOUS | Status: AC
  Filled 2021-08-28: qty 200

## 2021-08-28 MED ORDER — LIDOCAINE 2% (20 MG/ML) 5 ML SYRINGE
INTRAMUSCULAR | Status: DC | PRN
  Administered 2021-08-28: 40 mg via INTRAVENOUS

## 2021-08-28 MED ORDER — PROPOFOL 10 MG/ML IV BOLUS
INTRAVENOUS | Status: DC | PRN
  Administered 2021-08-28 (×2): 20 mg via INTRAVENOUS

## 2021-08-28 MED ORDER — SODIUM CHLORIDE (PF) 0.9 % IJ SOLN
INTRAMUSCULAR | Status: DC | PRN
  Administered 2021-08-28: 9 mL

## 2021-08-28 MED ORDER — SODIUM CHLORIDE 0.9 % IV SOLN: INTRAVENOUS | Status: DC

## 2021-08-28 MED ORDER — PROPOFOL 500 MG/50ML IV EMUL
INTRAVENOUS | Status: DC | PRN
  Administered 2021-08-28: 150 ug/kg/min via INTRAVENOUS

## 2021-08-28 MED ORDER — PROPOFOL 500 MG/50ML IV EMUL
INTRAVENOUS | Status: AC
  Filled 2021-08-28: qty 250

## 2021-08-28 MED ORDER — PHENYLEPHRINE 40 MCG/ML (10ML) SYRINGE FOR IV PUSH (FOR BLOOD PRESSURE SUPPORT)
PREFILLED_SYRINGE | INTRAVENOUS | Status: DC | PRN
  Administered 2021-08-28 (×2): 60 ug via INTRAVENOUS

## 2021-08-28 MED ORDER — LACTATED RINGERS IV SOLN
INTRAVENOUS | Status: DC | PRN
  Administered 2021-08-28: 1000 mL via INTRAVENOUS

## 2021-08-28 SURGICAL SUPPLY — 14 items

## 2021-08-28 NOTE — Anesthesia Preprocedure Evaluation (Addendum)
Anesthesia Evaluation  Patient identified by MRN, date of birth, ID band Patient awake    Reviewed: Allergy & Precautions, NPO status   Airway Mallampati: II  TM Distance: >3 FB     Dental   Pulmonary former smoker,    breath sounds clear to auscultation       Cardiovascular hypertension, + CAD and + Peripheral Vascular Disease   Rhythm:Regular Rate:Normal     Neuro/Psych  Neuromuscular disease    GI/Hepatic Neg liver ROS, GERD  ,  Endo/Other  negative endocrine ROS  Renal/GU negative Renal ROS     Musculoskeletal   Abdominal   Peds  Hematology   Anesthesia Other Findings   Reproductive/Obstetrics                             Anesthesia Physical Anesthesia Plan  ASA: 3  Anesthesia Plan: MAC   Post-op Pain Management:    Induction: Intravenous  PONV Risk Score and Plan: Ondansetron and Propofol infusion  Airway Management Planned: Nasal Cannula and Simple Face Mask  Additional Equipment:   Intra-op Plan:   Post-operative Plan:   Informed Consent: I have reviewed the patients History and Physical, chart, labs and discussed the procedure including the risks, benefits and alternatives for the proposed anesthesia with the patient or authorized representative who has indicated his/her understanding and acceptance.     Dental advisory given  Plan Discussed with: CRNA and Anesthesiologist  Anesthesia Plan Comments:         Anesthesia Quick Evaluation

## 2021-08-28 NOTE — Anesthesia Postprocedure Evaluation (Signed)
Anesthesia Post Note  Patient: Sergio Callahan  Procedure(s) Performed: ESOPHAGOGASTRODUODENOSCOPY (EGD) WITH PROPOFOL UPPER ENDOSCOPIC ULTRASOUND (EUS) RADIAL POLYPECTOMY BIOPSY SUBMUCOSAL INJECTION     Patient location during evaluation: Endoscopy Anesthesia Type: MAC Level of consciousness: awake Pain management: pain level controlled Vital Signs Assessment: post-procedure vital signs reviewed and stable Respiratory status: spontaneous breathing Cardiovascular status: stable Postop Assessment: no apparent nausea or vomiting Anesthetic complications: no   No notable events documented.  Last Vitals:  Vitals:   08/28/21 1310 08/28/21 1501  BP: (!) 142/88 96/62  Pulse: 80 72  Resp: 17 (!) 21  Temp: 36.9 C 36.4 C  SpO2: 97% 100%    Last Pain:  Vitals:   08/28/21 1501  TempSrc: Axillary  PainSc: Asleep                 Melenie Minniear

## 2021-08-28 NOTE — Op Note (Signed)
[facility] Patient Name: Sergio Callahan Procedure Date: 08/28/2021 MRN: [id number] Attending MD: Justice Britain , MD Date of Birth: 1951/05/15 CSN: [id number] Age: 70 Admit Type: Outpatient Procedure:                Upper EUS Indications:              Abnormal endoscopy, Neuroendocrine Tumor Providers:                Justice Britain, MD, Burtis Junes, RN, Hinton Dyer Referring MD:             Dr. Tasia Catchings, Andrey Farmer MD, MD, Baxter Hire,                            MD Medicines:                Monitored Anesthesia Care Complications:            No immediate complications. Estimated Blood Loss:     Estimated blood loss was minimal. Procedure:                Pre-Anesthesia Assessment:                           - Prior to the procedure, a History and Physical                            was performed, and patient medications and                            allergies were reviewed. The patient's tolerance of                            previous anesthesia was also reviewed. The risks                            and benefits of the procedure and the sedation                            options and risks were discussed with the patient.                            All questions were answered, and informed consent                            was obtained. Prior Anticoagulants: The patient has                            taken no previous anticoagulant or antiplatelet                            agents except for NSAID medication. ASA Grade                            Assessment: III - A patient with severe systemic  disease. After reviewing the risks and benefits,                            the patient was deemed in satisfactory condition to                            undergo the procedure.                           After obtaining informed consent, the endoscope was                            passed under direct vision. Throughout the                             procedure, the patient's blood pressure, pulse, and                            oxygen saturations were monitored continuously. The                            [id number]-[id number] ([id number]) Olympus therapeutic endoscope                            was introduced through the mouth, and advanced to                            the second part of duodenum. The [id number]-[id number]                            ([id number]) Olympus radial ultrasound scope was                            introduced through the mouth, and advanced to the                            duodenum for ultrasound examination from the                            stomach and duodenum. The upper EUS was                            accomplished without difficulty. The patient                            tolerated the procedure. Scope In: Scope Out: Findings:      ENDOSCOPIC FINDING: :      No gross lesions were noted in the entire esophagus.      The Z-line was irregular and was found 38 cm from the incisors.      A 3 cm hiatal hernia was present.      A single 4 mm semi-sessile polyp with no bleeding and no stigmata of       recent bleeding was found in the gastric body - in what seemed to be a  similar area to the previous polypectomy. Area was successfully injected       with 3 mL saline for lesion assessment, and this injection appeared to       lift the lesion adequately. The polyp was removed with a saline       injection-lift technique using a snare. Resection and retrieval were       complete.      Patchy mildly erythematous mucosa without bleeding was found in the       entire examined stomach. To evaluate and see if atrophic gastritis is       present, gastric mapping was performed. Biopsies were taken with a cold       forceps for histology from the antrum. Biopsies were taken with a cold       forceps for histology from the greater curve. Biopsies were taken with a       cold forceps for histology from the lesser curve.  Biopsies were taken       with a cold forceps for histology from the incisura. Biopsies were taken       with a cold forceps for histology from the cardia/fundus.      No gross lesions were noted in the duodenal bulb, in the first portion       of the duodenum and in the second portion of the duodenum.      ENDOSONOGRAPHIC FINDING: :      Endosonographic imaging in the entire stomach showed no extrinsic       compression, intramural (subepithelial) lesion, mass, varices or wall       thickening.      No malignant-appearing lymph nodes were visualized in the paracardial       region (level 16), left gastric region (level 17), gastrohepatic       ligament (level 18) and celiac region (level 20).      The celiac region was visualized. Impression:               EGD Impression:                           - No gross lesions in esophagus. Z-line irregular,                            38 cm from the incisors.                           - 3 cm hiatal hernia.                           - A single gastric polyp. Injected. Resected and                            retrieved.                           - Erythematous mucosa in the stomach. Biopsied via                            mapping biopsies to see if any evidence of chronic  gastritis.                           - No gross lesions in the duodenal bulb, in the                            first portion of the duodenum and in the second                            portion of the duodenum.                           EUS Impression:                           - Endosonographic imaging in the entire stomach                            showed no extrinsic compression, intramural                            (subepithelial) lesion, mass, varices or wall                            thickening.                           - No malignant-appearing lymph nodes were                            visualized in the paracardial region (level 16),                             left gastric region (level 17), gastrohepatic                            ligament (level 18) and celiac region (level 20). Moderate Sedation:      Not Applicable - Patient had care per Anesthesia. Recommendation:           - The patient will be observed post-procedure,                            until all discharge criteria are met.                           - Discharge patient to home.                           - Patient has a contact number available for                            emergencies. The signs and symptoms of potential                            delayed complications were discussed with the  patient. Return to normal activities tomorrow.                            Written discharge instructions were provided to the                            patient.                           - Observe patient's clinical course.                           - Await path results.                           - Repeat the upper endoscopy +/- EUS in 1 year for                            surveillance. 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                           - Follow up with Oncology as previous scheduled and                            with primary GI.                           - The findings and recommendations were discussed                            with the patient.                           - The findings and recommendations were discussed                            with the patient's family. Procedure Code(s):        --- Professional ---                           [phone], Esophagogastroduodenoscopy, flexible,                            transoral; with removal of tumor(s), polyp(s), or                            other lesion(s)  by snare technique                            [id number], Esophagogastroduodenoscopy, flexible,                            transoral; with endoscopic ultrasound examination                            limited to the esophagus, stomach or duodenum, and                            adjacent structures Diagnosis Code(s):        --- Professional ---                           K22.8, Other specified diseases of esophagus                           K44.9, Diaphragmatic hernia without obstruction or                            gangrene                           K31.7, Polyp of stomach and duodenum                           K31.89, Other diseases of stomach and duodenum                           I89.9, Noninfective disorder of lymphatic vessels                            and lymph nodes, unspecified                           K92.9, Disease of digestive system, unspecified CPT copyright 2019 American Medical Association. All rights reserved. The codes documented in this report are preliminary and upon coder review may  be revised to meet current compliance requirements. Justice Britain, MD 08/28/2021 3:31:57 PM Number of Addenda: 0

## 2021-08-28 NOTE — Anesthesia Procedure Notes (Signed)
Procedure Name: MAC Date/Time: 08/28/2021 1:59 PM Performed by: Eben Burow, CRNA Pre-anesthesia Checklist: Patient identified, Emergency Drugs available, Suction available, Patient being monitored and Timeout performed Oxygen Delivery Method: Simple face mask Placement Confirmation: positive ETCO2

## 2021-08-28 NOTE — H&P (Signed)
GASTROENTEROLOGY PROCEDURE H&P NOTE   Primary Care Physician: Baxter Hire, MD  HPI: Sergio Callahan is a 70 y.o. 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  Past Medical History:  Diagnosis Date   Abdominal aortic aneurysm without rupture    Anemia    takes B12 injections monthly   Anogenital (venereal) warts    Anxiety    takes Xanax daily as needed   Arthritis    back    Back pain    synovial cyst   Carpal tunnel syndrome    Celiac artery atherosclerosis    Collagen vascular disease (HCC)    Hx Lupus.   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    Hx of adenomatous colonic polyps    Hyperlipidemia    takes Pravastatin nightly   Hypertension    takes Lisinopril-HCTZ and Metoprolol daily   Hyponatremia    Iliac artery aneurysm (HCC)    Lupus (HCC)    takes Plaquenil and Imran daily   Nocturia    Numbness and tingling    left hip/leg/foot   Peripheral vascular disease (HCC)    Peripheral vascular disease (HCC)    Rotator cuff tendonitis    Urinary frequency    Past Surgical History:  Procedure Laterality Date   BROW LIFT Bilateral 01/27/2021   Procedure: BLEPHAROPLASTY UPPER EYELID; W/EXCESS SKIN BROW PTOSIS REPAIR AND BLEPHAROPTOSIS REPAIR; RESECT EX BILATERAL;  Surgeon: Karle Starch, MD;  Location: [place];  Service: Ophthalmology;  Laterality: Bilateral;   CARDIAC CATHETERIZATION  2012   COLONOSCOPY     COLONOSCOPY WITH PROPOFOL N/A 09/30/2019   Procedure: COLONOSCOPY WITH PROPOFOL;  Surgeon: Robert Bellow, MD;  Location: [facility];   Service: Endoscopy;  Laterality: N/A;   COLONOSCOPY WITH PROPOFOL N/A 05/05/2021   Procedure: COLONOSCOPY WITH PROPOFOL;  Surgeon: [name], MD;  Location: [facility];  Service: Endoscopy;  Laterality: N/A;   CORONARY ANGIOPLASTY     1 stent   ESOPHAGOGASTRODUODENOSCOPY     ESOPHAGOGASTRODUODENOSCOPY (EGD) WITH PROPOFOL N/A 05/05/2021   Procedure: ESOPHAGOGASTRODUODENOSCOPY (EGD) WITH PROPOFOL;  Surgeon: [name], MD;  Location: [facility];  Service: Endoscopy;  Laterality: N/A;   LUMBAR LAMINECTOMY/DECOMPRESSION MICRODISCECTOMY Left 02/03/2015   Procedure: LUMBAR LAMINECTOMY/DECOMPRESSION MICRODISCECTOMY LEFT LUMBAR FOUR-FIVE WITH REMOVAL OF SYNOVIAL CYST;  Surgeon: Newman Pies, MD;  Location: [place] [facility];  Service: Neurosurgery;  Laterality: Left;   NASAL SEPTOPLASTY W/ TURBINOPLASTY Bilateral 10/23/2019   Procedure: NASAL SEPTOPLASTY WITH SUBMUCOUS RESECTION OF TURBINATE;  Surgeon: Beverly Gust, MD;  Location: [place];  Service: ENT;  Laterality: Bilateral;   PELVIC ANGIOGRAPHY N/A 05/02/2018   Procedure: PELVIC ANGIOGRAPHY;  Surgeon: Katha Cabal, MD;  Location: [place] [facility];  Service: Cardiovascular;  Laterality: N/A;   SHOULDER ARTHROSCOPY WITH SUBACROMIAL DECOMPRESSION AND BICEP TENDON REPAIR Right 12/04/2018   Procedure: SHOULDER ARTHROSCOPY WITH DEBRIDEMENT, DECOMPRESSION, BICEP TENODESIS;  Surgeon: Corky Mull, MD;  Location: [facility];  Service: Orthopedics;  Laterality: Right;   No current facility-administered medications for this  encounter.   No current facility-administered medications for this encounter. Allergies  Allergen Reactions   Fosamax [Alendronate Sodium]     Leg pain   Percocet [Oxycodone-Acetaminophen] Nausea And Vomiting   Family History  Problem Relation Age of Onset   Leukemia Mother    Hyperlipidemia Father    Hypertension Father    Diabetes Father    Colon cancer Father    Hearing loss Father     Vascular Disease Father    Social History   Socioeconomic History   Marital status: Married    Spouse name: Not on file   Number of children: Not on file   Years of education: Not on file   Highest education level: Not on file  Occupational History   Not on file  Tobacco Use   Smoking status: Former    Packs/day: 1.50    Years: 30.00    Pack years: 45.00    Types: Cigarettes    Quit date: 03/31/2021    Years since quitting: 0.4   Smokeless tobacco: Never  Vaping Use   Vaping Use: Never used  Substance and Sexual Activity   Alcohol use: Yes    Comment: cocktails about 2 daily   Drug use: No   Sexual activity: Yes  Other Topics Concern   Not on file  Social History Narrative   Not on file   Social Determinants of Health   Financial Resource Strain: Not on file  Food Insecurity: Not on file  Transportation Needs: Not on file  Physical Activity: Not on file  Stress: Not on file  Social Connections: Not on file  Intimate Partner Violence: Not on file    Physical Exam: There were no vitals filed for this visit. There is no height or weight on file to calculate BMI. GEN: NAD EYE: Sclerae anicteric ENT: MMM CV: Non-tachycardic GI: Soft, NT/ND NEURO:  Alert & Oriented x 3  Lab Results: No results for input(s): WBC, HGB, HCT, PLT in the last 72 hours. BMET No results for input(s): NA, K, CL, CO2, GLUCOSE, BUN, CREATININE, CALCIUM in the last 72 hours. LFT No results for input(s): PROT, ALBUMIN, AST, ALT, ALKPHOS, BILITOT, BILIDIR, IBILI in the last 72 hours. PT/INR No results for input(s): LABPROT, INR in the last 72 hours.   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  The risks of an EUS including intestinal perforation, bleeding, infection, aspiration, and medication effects were discussed as  was the possibility it may not give a definitive diagnosis if a biopsy is performed.  When a biopsy of the pancreas is done as part of the EUS, there is an additional risk of pancreatitis at the rate of about 1-2%.  It was explained that procedure related pancreatitis is typically mild, although it can be severe and even life threatening, which is why we do not perform random pancreatic biopsies and only biopsy a lesion/area we feel is concerning enough to warrant the risk.   The risks and benefits of endoscopic evaluation/treatment were discussed with the patient and/or family; these include but are not limited to the risk of perforation, infection, bleeding, missed lesions, lack of diagnosis, severe illness requiring hospitalization, as well as anesthesia and sedation related illnesses.  The patient's history has been reviewed, patient examined, no change in status, and deemed stable for procedure.  The patient and/or  family is agreeable to proceed.    Justice Britain, MD [facility] Office # [phone]

## 2021-08-28 NOTE — Discharge Instructions (Signed)
YOU HAD AN ENDOSCOPIC PROCEDURE TODAY: Refer to the procedure report and other information in the discharge instructions given to you for any specific questions about what was found during the examination. If this information does not answer your questions, please call [place] office at [phone] to clarify.   YOU SHOULD EXPECT: Some feelings of bloating in the abdomen. Passage of more gas than usual. Walking can help get rid of the air that was put into your GI tract during the procedure and reduce the bloating. If you had a lower endoscopy (such as a colonoscopy or flexible sigmoidoscopy) you may notice spotting of blood in your stool or on the toilet paper. Some abdominal soreness may be present for a day or two, also.  DIET: Your first meal following the procedure should be a light meal and then it is ok to progress to your normal diet. A half-sandwich or bowl of soup is an example of a good first meal. Heavy or fried foods are harder to digest and may make you feel nauseous or bloated. Drink plenty of fluids but you should avoid alcoholic beverages for 24 hours. If you had a esophageal dilation, please see attached instructions for diet.    ACTIVITY: Your care partner should take you home directly after the procedure. You should plan to take it easy, moving slowly for the rest of the day. You can resume normal activity the day after the procedure however YOU SHOULD NOT DRIVE, use power tools, machinery or perform tasks that involve climbing or major physical exertion for 24 hours (because of the sedation medicines used during the test).   SYMPTOMS TO REPORT IMMEDIATELY: A gastroenterologist can be reached at any hour. Please call [phone]  for any of the following symptoms:   Following upper endoscopy (EGD, EUS, ERCP, esophageal dilation) Vomiting of blood or coffee ground material  New, significant abdominal pain  New, significant chest pain or pain under the shoulder blades  Painful or  persistently difficult swallowing  New shortness of breath  Black, tarry-looking or red, bloody stools  FOLLOW UP:  If any biopsies were taken you will be contacted by phone or by letter within the next 1-3 weeks. Call [phone]  if you have not heard about the biopsies in 3 weeks.  Please also call with any specific questions about appointments or follow up tests.  

## 2021-08-28 NOTE — Transfer of Care (Signed)
Immediate Anesthesia Transfer of Care Note  Patient: Sergio Callahan  Procedure(s) Performed: ESOPHAGOGASTRODUODENOSCOPY (EGD) WITH PROPOFOL UPPER ENDOSCOPIC ULTRASOUND (EUS) RADIAL POLYPECTOMY BIOPSY SUBMUCOSAL INJECTION  Patient Location: PACU  Anesthesia Type:MAC  Level of Consciousness: awake, alert  and patient cooperative  Airway & Oxygen Therapy: Patient Spontanous Breathing and Patient connected to face mask oxygen  Post-op Assessment: Report given to RN and Post -op Vital signs reviewed and stable  Post vital signs: Reviewed and stable  Last Vitals:  Vitals Value Taken Time  BP    Temp    Pulse 71 08/28/21 1503  Resp 21 08/28/21 1503  SpO2 100 % 08/28/21 1503  Vitals shown include unvalidated device data.  Last Pain:  Vitals:   08/28/21 1310  TempSrc: Oral  PainSc: 0-No pain         Complications: No notable events documented.

## 2021-08-29 ENCOUNTER — Encounter (HOSPITAL_COMMUNITY): Payer: Self-pay | Admitting: Gastroenterology

## 2021-08-29 LAB — SURGICAL PATHOLOGY

## 2021-08-30 ENCOUNTER — Encounter: Payer: Self-pay | Admitting: Gastroenterology

## 2021-10-02 ENCOUNTER — Telehealth: Payer: Self-pay

## 2021-10-02 DIAGNOSIS — C7A8 Other malignant neuroendocrine tumors: Secondary | ICD-10-CM

## 2021-10-02 NOTE — Telephone Encounter (Signed)
-----   Message from Earlie Server, MD sent at 09/30/2021  5:17 PM EST ----- Please arrange him to get labs, cbc cmp multiple myleoma panel, light chain ratio, B12 in Jan, and see me 1 week later. Thanks.

## 2021-10-02 NOTE — Telephone Encounter (Signed)
Sergio Callahan, Please schedule patient for labs in January and MD 1 week later. Thanks

## 2021-10-24 ENCOUNTER — Other Ambulatory Visit: Payer: Self-pay | Admitting: General Surgery

## 2021-10-26 LAB — SURGICAL PATHOLOGY

## 2021-10-27 ENCOUNTER — Encounter: Admission: RE | Payer: Self-pay | Source: Home / Self Care

## 2021-10-27 ENCOUNTER — Ambulatory Visit: Admission: RE | Admit: 2021-10-27 | Payer: Medicare Other | Source: Home / Self Care

## 2021-10-27 SURGERY — ESOPHAGOGASTRODUODENOSCOPY (EGD) WITH PROPOFOL

## 2021-11-10 ENCOUNTER — Encounter: Payer: Self-pay | Admitting: Urology

## 2021-11-12 DIAGNOSIS — C7A8 Other malignant neuroendocrine tumors: Secondary | ICD-10-CM

## 2021-11-12 DIAGNOSIS — C801 Malignant (primary) neoplasm, unspecified: Secondary | ICD-10-CM

## 2021-11-12 HISTORY — DX: Malignant (primary) neoplasm, unspecified: C80.1

## 2021-11-12 HISTORY — DX: Other malignant neuroendocrine tumors: C7A.8

## 2021-11-20 ENCOUNTER — Other Ambulatory Visit: Payer: PRIVATE HEALTH INSURANCE

## 2021-11-27 ENCOUNTER — Other Ambulatory Visit: Payer: PRIVATE HEALTH INSURANCE

## 2021-11-28 ENCOUNTER — Inpatient Hospital Stay: Payer: Medicare Other | Admitting: Oncology

## 2021-11-28 ENCOUNTER — Inpatient Hospital Stay: Payer: Medicare Other | Attending: Oncology

## 2021-11-28 ENCOUNTER — Other Ambulatory Visit: Payer: Self-pay

## 2021-11-28 DIAGNOSIS — C7A8 Other malignant neuroendocrine tumors: Secondary | ICD-10-CM | POA: Diagnosis not present

## 2021-11-28 LAB — CBC WITH DIFFERENTIAL/PLATELET
Abs Immature Granulocytes: 0.04 10*3/uL (ref 0.00–0.07)
Basophils Absolute: 0 10*3/uL (ref 0.0–0.1)
Basophils Relative: 1 %
Eosinophils Absolute: 0.1 10*3/uL (ref 0.0–0.5)
Eosinophils Relative: 2 %
HCT: 43.7 % (ref 39.0–52.0)
Hemoglobin: 15.2 g/dL (ref 13.0–17.0)
Immature Granulocytes: 1 %
Lymphocytes Relative: 22 %
Lymphs Abs: 1.2 10*3/uL (ref 0.7–4.0)
MCH: 33.2 pg (ref 26.0–34.0)
MCHC: 34.8 g/dL (ref 30.0–36.0)
MCV: 95.4 fL (ref 80.0–100.0)
Monocytes Absolute: 0.6 10*3/uL (ref 0.1–1.0)
Monocytes Relative: 11 %
Neutro Abs: 3.3 10*3/uL (ref 1.7–7.7)
Neutrophils Relative %: 63 %
Platelets: 251 10*3/uL (ref 150–400)
RBC: 4.58 MIL/uL (ref 4.22–5.81)
RDW: 13.1 % (ref 11.5–15.5)
WBC: 5.2 10*3/uL (ref 4.0–10.5)
nRBC: 0 % (ref 0.0–0.2)

## 2021-11-28 LAB — COMPREHENSIVE METABOLIC PANEL
ALT: 39 U/L (ref 0–44)
AST: 37 U/L (ref 15–41)
Albumin: 4.3 g/dL (ref 3.5–5.0)
Alkaline Phosphatase: 63 U/L (ref 38–126)
Anion gap: 7 (ref 5–15)
BUN: 12 mg/dL (ref 8–23)
CO2: 29 mmol/L (ref 22–32)
Calcium: 9 mg/dL (ref 8.9–10.3)
Chloride: 100 mmol/L (ref 98–111)
Creatinine, Ser: 0.94 mg/dL (ref 0.61–1.24)
GFR, Estimated: >60 mL/min (ref >60)
Glucose, Bld: 89 mg/dL (ref 70–99)
Potassium: 4.1 mmol/L (ref 3.5–5.1)
Sodium: 136 mmol/L (ref 135–145)
Total Bilirubin: 0.7 mg/dL (ref 0.3–1.2)
Total Protein: 7.5 g/dL (ref 6.5–8.1)

## 2021-11-28 LAB — VITAMIN B12: Vitamin B-12: 390 pg/mL (ref 180–914)

## 2021-11-29 LAB — KAPPA/LAMBDA LIGHT CHAINS
Kappa free light chain: 20.2 mg/L — ABNORMAL HIGH (ref 3.3–19.4)
Kappa, lambda light chain ratio: 1.53 (ref 0.26–1.65)
Lambda free light chains: 13.2 mg/L (ref 5.7–26.3)

## 2021-12-04 ENCOUNTER — Encounter: Payer: Self-pay | Admitting: Oncology

## 2021-12-04 ENCOUNTER — Other Ambulatory Visit: Payer: Self-pay

## 2021-12-04 ENCOUNTER — Inpatient Hospital Stay (HOSPITAL_BASED_OUTPATIENT_CLINIC_OR_DEPARTMENT_OTHER): Payer: Medicare Other | Admitting: Oncology

## 2021-12-04 VITALS — BP 108/83 | HR 88 | Temp 96.4°F | Resp 18 | Wt 199.7 lb

## 2021-12-04 DIAGNOSIS — Z809 Family history of malignant neoplasm, unspecified: Secondary | ICD-10-CM | POA: Diagnosis not present

## 2021-12-04 DIAGNOSIS — C7A8 Other malignant neuroendocrine tumors: Secondary | ICD-10-CM

## 2021-12-04 DIAGNOSIS — Z789 Other specified health status: Secondary | ICD-10-CM

## 2021-12-04 DIAGNOSIS — K760 Fatty (change of) liver, not elsewhere classified: Secondary | ICD-10-CM | POA: Diagnosis not present

## 2021-12-04 DIAGNOSIS — R6889 Other general symptoms and signs: Secondary | ICD-10-CM

## 2021-12-04 LAB — MULTIPLE MYELOMA PANEL, SERUM
Albumin SerPl Elph-Mcnc: 4.2 g/dL (ref 2.9–4.4)
Albumin/Glob SerPl: 1.6 (ref 0.7–1.7)
Alpha 1: 0.2 g/dL (ref 0.0–0.4)
Alpha2 Glob SerPl Elph-Mcnc: 0.7 g/dL (ref 0.4–1.0)
B-Globulin SerPl Elph-Mcnc: 0.9 g/dL (ref 0.7–1.3)
Gamma Glob SerPl Elph-Mcnc: 1 g/dL (ref 0.4–1.8)
Globulin, Total: 2.8 g/dL (ref 2.2–3.9)
IgA: 208 mg/dL (ref 61–437)
IgG (Immunoglobin G), Serum: 1075 mg/dL (ref 603–1613)
IgM (Immunoglobulin M), Srm: 98 mg/dL (ref 20–172)
M Protein SerPl Elph-Mcnc: 0.4 g/dL — ABNORMAL HIGH
Total Protein ELP: 7 g/dL (ref 6.0–8.5)

## 2021-12-04 NOTE — Progress Notes (Signed)
Hematology/Oncology Progress note [phone] [phone]   Patient Care Team: Baxter Hire, MD as PCP - General (Internal Medicine) Clent Jacks, RN as Oncology Nurse Navigator  REFERRING PROVIDER: Baxter Hire, MD  CHIEF COMPLAINTS/REASON FOR VISIT:  gastric neuroendocrine carcinoma  HISTORY OF PRESENTING ILLNESS:   Sergio Callahan is a  71 y.o.  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  Was referred to establish with gastroenterology. 05/05/2021, patient underwent EGD and colonoscopy by Dr. Haig Prophet. EGD showed gastritis, erythematous mucosa in the gastric fundus, multiple fundic gland polyps, resected and retrieved. Colonoscopy showed multiple colon polyps, resected and retrieved.  Diverticulosis.  Erythematous mucosa in the sigmoid colon.  CASE: [phone]  PATIENT: Sergio Callahan  Surgical Pathology Report  Specimen Submitted:  A. Stomach; cbx  B. Stomach polyp; cbx  C. Stomach, abnormal mucosa; cbx  D. Stomach polyp, inflammatory; cold snare  E. Colon polyp, rectosigmoid; cbx  F. Colon polyp, cecum; cbx  G. Colon polyp x2, transverse; cold snare (1) and cbx (1)  H. Colon, sigmoid, abnormal mucosa; cbx  I. Colon polyp, sigmoid; cbx   Clinical History: GERD, heme positive. Findings: Gastric polyps,  gastritis, colon polyps    DIAGNOSIS:  A.  STOMACH; COLD BIOPSY:  - ANTRAL-TYPE MUCOSA WITH REACTIVE FOVEOLAR HYPERPLASIA.  - OXYNTIC MUCOSA WITH PROTON PUMP INHIBITOR EFFECT.  - NEGATIVE FOR H. PYLORI, INTESTINAL METAPLASIA, DYSPLASIA, AND  MALIGNANCY.   B.  STOMACH POLYP; COLD BIOPSY:  - FUNDIC GLAND POLYP.  - NEGATIVE FOR DYSPLASIA AND MALIGNANCY.   C.  STOMACH, ABNORMAL MUCOSA; COLD BIOPSY:  - OXYNTIC-TYPE MUCOSA WITH REACTIVE FOVEOLAR HYPERPLASIA, HEMORRHAGE,  AND  STROMAL CHANGES SUGGESTIVE OF HEALED MUCOSAL INJURY.  - NEGATIVE FOR INTESTINAL METAPLASIA, DYSPLASIA, AND MALIGNANCY.   D.  STOMACH POLYP, "INFLAMMATORY"; COLD SNARE:  - WELL-DIFFERENTIATED NEUROENDOCRINE TUMOR (NET G2), AT LEAST 5 MM IN  SIZE, SEE COMMENT.  - TUMOR INVOLVES THE MUSCULARIS MUCOSAE AND EXTENDS TO THE INKED DEEP  MARGIN.   E.  COLON POLYP, RECTOSIGMOID; COLD BIOPSY:  - HYPERPLASTIC POLYP.  - NEGATIVE FOR DYSPLASIA AND MALIGNANCY.   F.  COLON POLYP, CECUM; COLD BIOPSY:  - TUBULAR ADENOMA.  - NEGATIVE FOR HIGH-GRADE DYSPLASIA AND MALIGNANCY.   G.  COLON POLYP X 2; COLD SNARE (1) AND COLD BIOPSY (1):  - TUBULAR ADENOMA.  - NEGATIVE FOR HIGH-GRADE DYSPLASIA AND MALIGNANCY.  - ONLY 1 PIECE OF TISSUE FOUND IN THE CONTAINER.   H.  SIGMOID COLON, ABNORMAL MUCOSA; COLD BIOPSY:  - COLONIC MUCOSA WITH INTACT CRYPT ARCHITECTURE.  - LYMPHOID AGGREGATES AND CONGESTION.  - NEGATIVE FOR DYSPLASIA AND MALIGNANCY.   I.  COLON POLYP, SIGMOID; COLD BIOPSY:  - HYPERPLASTIC POLYP.  - NEGATIVE FOR DYSPLASIA AND MALIGNANCY.   Patient denies any stomach discomfort today. He is on omeprazole 20 mg daily chronically.  He drinks alcohol on some days. Lupus, follows up with Dr. Jefm Bryant.  He is on Imuran and Plaquenil Denies any unintentional weight loss, night sweats, diarrhea, facial flushing, fever.   INTERVAL HISTORY Sergio Callahan is a 71 y.o. male who has above history reviewed by me today presents for follow up visit for management of gastric neuroendocrine carcinoma  Gastrin level was initially elevated on  with a level of 759, repeat after holding PPI for 2 weeks on 07/10/2021  showed a normalized level of 76.   06/20/2021 Dotatate PET scan showed no focal radial tracer activity within the stomach to localize well differentiated neuroendocrine tumor.  Diffuse physiological activity throughout the stomach.  No evidence of well-differentiated metastatic neuroendocrine tumor or  adenopathy.  08/28/2021, patient underwent EUS by Dr.Mansouraty FINAL MICROSCOPIC DIAGNOSIS:  A. STOMACH, BODY, POLYPECTOMY:  - Fundic gland polyp  - Negative for dysplasia  B. STOMACH, ANTRUM, BIOPSY:  - Gastric antral mucosa with mild chronic gastritis  - Warthin Starry stain is negative for Helicobacter pylori  C. STOMACH, INCISURA, BIOPSY:  - Gastric oxyntic mucosa with parietal cell hyperplasia as can be seen  in hypergastrinemic states such as PPI therapy.  - Negative for atrophy or intestinal metaplasia  D. STOMACH, GREATER CURVE, BIOPSY:  - Gastric oxyntic mucosa with parietal cell hyperplasia as can be seen  in hypergastrinemic states such as PPI therapy.  - Negative for atrophy or intestinal metaplasia  E. STOMACH, LESSER CURVE, BIOPSY:  - Gastric oxyntic mucosa with parietal cell hyperplasia as can be seen  in hypergastrinemic states such as PPI therapy.  - Negative for atrophy or intestinal metaplasia  F. STOMACH, CARDIA AND FUNDUS, BIOPSY:  - Gastric oxyntic mucosa with parietal cell hyperplasia as can be seen  in hypergastrinemic states such as PPI therapy.  - Negative for atrophy or intestinal metaplasia  For gynecomastia work-up.  Patient underwent bilateral diagnostic mammogram on 06/12/2021.  Benign left greater than right gynecomastia.  Normal right axillary ultrasound. Patient underwent left breast vacuum-assisted core needle biopsy recommended by Dr. Bary Castilla, pathology showed benign mammary parenchyma with gynecomastia.  Negative for atypical proliferation breast disease.  Review of Systems  Constitutional:  Negative for appetite change, chills, fatigue, fever and unexpected weight change.  HENT:   Negative for hearing loss and voice change.   Eyes:  Negative for eye problems and icterus.  Respiratory:  Negative for chest tightness, cough and shortness of breath.   Cardiovascular:  Negative for chest pain and leg swelling.  Gastrointestinal:  Negative for  abdominal distention and abdominal pain.  Endocrine: Negative for hot flashes.  Genitourinary:  Negative for difficulty urinating, dysuria and frequency.   Musculoskeletal:  Negative for arthralgias.  Skin:  Negative for itching and rash.  Neurological:  Negative for light-headedness and numbness.  Hematological:  Negative for adenopathy. Does not bruise/bleed easily.  Psychiatric/Behavioral:  Negative for confusion.    MEDICAL HISTORY:  Past Medical History:  Diagnosis Date   Abdominal aortic aneurysm without rupture    Anemia    takes B12 injections monthly   Anogenital (venereal) warts    Anxiety    takes Xanax daily as needed   Arthritis    back    Back pain    synovial cyst   Carpal tunnel syndrome    Celiac artery atherosclerosis    Collagen vascular disease (HCC)    Hx Lupus.   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    Hx of adenomatous colonic polyps    Hyperlipidemia    takes Pravastatin nightly   Hypertension    takes Lisinopril-HCTZ and Metoprolol daily   Hyponatremia  Iliac artery aneurysm (HCC)    Lupus (HCC)    takes Plaquenil and Imran daily   Nocturia    Numbness and tingling    left hip/leg/foot   Peripheral vascular disease (HCC)    Peripheral vascular disease (HCC)    Rotator cuff tendonitis    Urinary frequency     SURGICAL HISTORY: Past Surgical History:  Procedure Laterality Date   BIOPSY  08/28/2021   Procedure: BIOPSY;  Surgeon: Rush Landmark Telford Nab., MD;  Location: [facility];  Service: Gastroenterology;;   BROW LIFT Bilateral 01/27/2021   Procedure: BLEPHAROPLASTY UPPER EYELID; W/EXCESS SKIN BROW PTOSIS REPAIR AND BLEPHAROPTOSIS REPAIR; RESECT EX BILATERAL;   Surgeon: Karle Starch, MD;  Location: [facility];  Service: Ophthalmology;  Laterality: Bilateral;   CARDIAC CATHETERIZATION  2012   COLONOSCOPY     COLONOSCOPY WITH PROPOFOL N/A 09/30/2019   Procedure: COLONOSCOPY WITH PROPOFOL;  Surgeon: Robert Bellow, MD;  Location: [facility];  Service: Endoscopy;  Laterality: N/A;   COLONOSCOPY WITH PROPOFOL N/A 05/05/2021   Procedure: COLONOSCOPY WITH PROPOFOL;  Surgeon: [name], MD;  Location: [facility];  Service: Endoscopy;  Laterality: N/A;   CORONARY ANGIOPLASTY     1 stent   ESOPHAGOGASTRODUODENOSCOPY     ESOPHAGOGASTRODUODENOSCOPY (EGD) WITH PROPOFOL N/A 05/05/2021   Procedure: ESOPHAGOGASTRODUODENOSCOPY (EGD) WITH PROPOFOL;  Surgeon: [name], MD;  Location: [facility];  Service: Endoscopy;  Laterality: N/A;   ESOPHAGOGASTRODUODENOSCOPY (EGD) WITH PROPOFOL N/A 08/28/2021   Procedure: ESOPHAGOGASTRODUODENOSCOPY (EGD) WITH PROPOFOL;  Surgeon: Rush Landmark Telford Nab., MD;  Location: [facility];  Service: Gastroenterology;  Laterality: N/A;   EUS N/A 08/28/2021   Procedure: UPPER ENDOSCOPIC ULTRASOUND (EUS) RADIAL;  Surgeon: Irving Copas., MD;  Location: [facility];  Service: Gastroenterology;  Laterality: N/A;   LUMBAR LAMINECTOMY/DECOMPRESSION MICRODISCECTOMY Left 02/03/2015   Procedure: LUMBAR LAMINECTOMY/DECOMPRESSION MICRODISCECTOMY LEFT LUMBAR FOUR-FIVE WITH REMOVAL OF SYNOVIAL CYST;  Surgeon: Newman Pies, MD;  Location: [facility];  Service: Neurosurgery;  Laterality: Left;   NASAL SEPTOPLASTY W/ TURBINOPLASTY Bilateral 10/23/2019   Procedure: NASAL SEPTOPLASTY WITH SUBMUCOUS RESECTION OF TURBINATE;  Surgeon: Beverly Gust, MD;  Location: [facility];  Service: ENT;  Laterality: Bilateral;   PELVIC ANGIOGRAPHY N/A 05/02/2018   Procedure: PELVIC ANGIOGRAPHY;  Surgeon: Katha Cabal, MD;  Location: [facility];  Service: Cardiovascular;  Laterality: N/A;    POLYPECTOMY  08/28/2021   Procedure: POLYPECTOMY;  Surgeon: Rush Landmark Telford Nab., MD;  Location: [facility];  Service: Gastroenterology;;   SHOULDER ARTHROSCOPY WITH SUBACROMIAL DECOMPRESSION AND BICEP TENDON REPAIR Right 12/04/2018   Procedure: SHOULDER ARTHROSCOPY WITH DEBRIDEMENT, DECOMPRESSION, BICEP TENODESIS;  Surgeon: Corky Mull, MD;  Location: [facility];  Service: Orthopedics;  Laterality: Right;   SUBMUCOSAL INJECTION  08/28/2021   Procedure: SUBMUCOSAL INJECTION;  Surgeon: Rush Landmark Telford Nab., MD;  Location: [facility];  Service: Gastroenterology;;    SOCIAL HISTORY: Social History   Socioeconomic History   Marital status: Married    Spouse name: Not on file   Number of children: Not on file   Years of education: Not on file   Highest education level: Not on file  Occupational History   Not on file  Tobacco Use   Smoking status: Former    Packs/day: 1.50    Years: 30.00    Pack years: 45.00    Types: Cigarettes    Quit date: 03/31/2021    Years since quitting: 0.6   Smokeless tobacco: Never  Vaping Use  Vaping Use: Never used  Substance and Sexual Activity   Alcohol use: Yes    Comment: cocktails about 2 daily   Drug use: No   Sexual activity: Yes  Other Topics Concern   Not on file  Social History Narrative   Not on file   Social Determinants of Health   Financial Resource Strain: Not on file  Food Insecurity: Not on file  Transportation Needs: Not on file  Physical Activity: Not on file  Stress: Not on file  Social Connections: Not on file  Intimate Partner Violence: Not on file    FAMILY HISTORY: Family History  Problem Relation Age of Onset   Leukemia Mother    Hyperlipidemia Father    Hypertension Father    Diabetes Father    Colon cancer Father    Hearing loss Father    Vascular Disease Father     ALLERGIES:  is allergic to fosamax [alendronate sodium] and percocet [oxycodone-acetaminophen].  MEDICATIONS:  Current  Outpatient Medications  Medication Sig Dispense Refill   ALPRAZolam (XANAX) 0.5 MG tablet Take 0.5 mg by mouth 3 (three) times daily as needed for anxiety.      amLODipine (NORVASC) 5 MG tablet Take 5 mg by mouth daily.     aspirin EC 81 MG tablet Take 81 mg by mouth daily.     azaTHIOprine (IMURAN) 50 MG tablet Take 125 mg by mouth daily.      cetirizine (ZYRTEC) 10 MG tablet Take 10 mg by mouth daily.     Cholecalciferol (VITAMIN D) 50 MCG (2000 UT) tablet Take 2,000 Units by mouth daily.     cyanocobalamin (,VITAMIN B-12,) 1000 MCG/ML injection Inject 1,000 mcg into the muscle every 14 (fourteen) days.      ELDERBERRY PO Take 2 tablets by mouth daily.     finasteride (PROSCAR) 5 MG tablet Take 5 mg by mouth daily.     hydrochlorothiazide (HYDRODIURIL) 12.5 MG tablet Take 12.5 mg by mouth daily.     hydroxychloroquine (PLAQUENIL) 200 MG tablet Take 200 mg by mouth 2 (two) times daily.     ibuprofen (ADVIL,MOTRIN) 200 MG tablet Take 400 mg by mouth every 8 (eight) hours as needed for headache or moderate pain.     lisinopril (ZESTRIL) 40 MG tablet Take 40 mg by mouth daily.     metoprolol succinate (TOPROL-XL) 100 MG 24 hr tablet Take 100 mg by mouth at bedtime. Take with or immediately following a meal.     nicotine (NICODERM CQ - DOSED IN MG/24 HOURS) 21 mg/24hr patch Place 21 mg onto the skin daily.     omeprazole (PRILOSEC) 20 MG capsule Take 20 mg by mouth at bedtime.     pravastatin (PRAVACHOL) 40 MG tablet Take 40 mg by mouth at bedtime.     Probiotic Product (PROBIOTIC DAILY PO) Take 1 capsule by mouth daily.     tamsulosin (FLOMAX) 0.4 MG CAPS capsule Take 0.4 mg by mouth daily after breakfast.     triamcinolone (NASACORT) 55 MCG/ACT AERO nasal inhaler Place 1 spray into the nose daily as needed (allergies).     nitroGLYCERIN (NITROLINGUAL) 0.4 MG/SPRAY spray Place 1 spray under the tongue every 5 (five) minutes x 3 doses as needed for chest pain. (Patient not taking: Reported on  12/04/2021)     sildenafil (REVATIO) 20 MG tablet Take 20 mg by mouth daily as needed for erectile dysfunction.  (Patient not taking: Reported on 12/04/2021)  5   spironolactone (ALDACTONE) 25  MG tablet Take 25 mg by mouth daily. (Patient not taking: Reported on 12/04/2021)     No current facility-administered medications for this visit.     PHYSICAL EXAMINATION: ECOG PERFORMANCE STATUS: 0 - Asymptomatic Vitals:   12/04/21 1425  BP: 108/83  Pulse: 88  Resp: 18  Temp: (!) 96.4 F (35.8 C)   Filed Weights   12/04/21 1425  Weight: 199 lb 11.2 oz (90.6 kg)    Physical Exam Constitutional:      General: He is not in acute distress. HENT:     Head: Normocephalic and atraumatic.  Eyes:     General: No scleral icterus. Cardiovascular:     Rate and Rhythm: Normal rate and regular rhythm.     Heart sounds: Normal heart sounds.  Pulmonary:     Effort: Pulmonary effort is normal. No respiratory distress.     Breath sounds: No wheezing.  Abdominal:     General: Bowel sounds are normal. There is no distension.     Palpations: Abdomen is soft.  Musculoskeletal:        General: No deformity. Normal range of motion.     Cervical back: Normal range of motion and neck supple.  Skin:    General: Skin is warm and dry.     Findings: No erythema or rash.  Neurological:     Mental Status: He is alert and oriented to person, place, and time. Mental status is at baseline.     Cranial Nerves: No cranial nerve deficit.     Coordination: Coordination normal.  Psychiatric:        Mood and Affect: Mood normal.     LABORATORY DATA:  I have reviewed the data as listed Lab Results  Component Value Date   WBC 5.2 11/28/2021   HGB 15.2 11/28/2021   HCT 43.7 11/28/2021   MCV 95.4 11/28/2021   PLT 251 11/28/2021   Recent Labs    06/06/21 1624 08/28/21 1321 11/28/21 1340  NA 127* 134* 136  K 3.6 4.4 4.1  CL 94* 102 100  CO2 25  --  29  GLUCOSE 122* 92 89  BUN 14 19 12   CREATININE  0.73 0.80 0.94  CALCIUM 9.1  --  9.0  GFRNONAA >60  --  >60  PROT 7.0  --  7.5  ALBUMIN 4.1  --  4.3  AST 42*  --  37  ALT 45*  --  39  ALKPHOS 51  --  63  BILITOT 1.0  --  0.7    Iron/TIBC/Ferritin/ %Sat No results found for: IRON, TIBC, FERRITIN, IRONPCTSAT    RADIOGRAPHIC STUDIES: I have personally reviewed the radiological images as listed and agreed with the findings in the report. No results found.    ASSESSMENT & PLAN:  1. Neuroendocrine carcinoma of stomach ([place])   2. Alcohol use   3. Fatty liver   4. Family history of cancer    #Gastric neuroendocrine carcinoma-well differentiated, Grade 2. Patient is on chronic PPI with initial high gastrin level.  Multiple gastric polyps.   Based on my previous discussion with pathology Dr. Dicie Beam, there is no typical findings of  chronic atrophic gastritis to suggest a typical Type I. The complete normalization of hypergastrinemia after PPI is not typical for type II hypergastrinemia secondary to gastrinoma. Type III is a possibility and hard to exclude. 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 Tumors arising in  the setting of chronic PPI use appear to behave in a less malignant fashion than true-sporadic tumors. I recommend endoscopy surveillance.   #IgG MGUS, M protein is stable. recommend observation. Check SPEP and light chain ratio now and every 6 months.  # gynecomastia, possibly due to ACE inhibitor. No intervention needed.  Marland Kitchen#Chronic hyponatremia, probably due to chronic alcohol use. #Transaminitis, probabaly due to ETOH/fatty liver disease.  Family history of colon cancer.  May benefit from genetic counseling.  We will further discuss with him at the next visit.  Orders Placed This Encounter  Procedures   Comprehensive metabolic panel    Standing Status:   Future    Standing Expiration Date:   12/04/2022   CBC with  Differential/Platelet    Standing Status:   Future    Standing Expiration Date:   12/04/2022   Kappa/lambda light chains    Standing Status:   Future    Standing Expiration Date:   12/04/2022   Multiple Myeloma Panel (SPEP&IFE w/QIG)    Standing Status:   Future    Standing Expiration Date:   12/04/2022   Vitamin B12    Standing Status:   Future    Standing Expiration Date:   12/04/2022    All questions were answered. The patient knows to call the clinic with any problems questions or concerns.  cc Baxter Hire, MD    Return of visit: 6 months.    Earlie Server, MD, PhD  12/04/2021

## 2021-12-04 NOTE — Progress Notes (Signed)
Pt here for follow up. No new concerns voiced.   

## 2021-12-18 ENCOUNTER — Other Ambulatory Visit (INDEPENDENT_AMBULATORY_CARE_PROVIDER_SITE_OTHER): Payer: Self-pay | Admitting: Vascular Surgery

## 2021-12-18 ENCOUNTER — Encounter (INDEPENDENT_AMBULATORY_CARE_PROVIDER_SITE_OTHER): Payer: Self-pay | Admitting: Vascular Surgery

## 2021-12-18 ENCOUNTER — Ambulatory Visit (INDEPENDENT_AMBULATORY_CARE_PROVIDER_SITE_OTHER): Payer: Medicare Other

## 2021-12-18 ENCOUNTER — Ambulatory Visit (INDEPENDENT_AMBULATORY_CARE_PROVIDER_SITE_OTHER): Payer: Medicare Other | Admitting: Vascular Surgery

## 2021-12-18 ENCOUNTER — Other Ambulatory Visit: Payer: Self-pay

## 2021-12-18 VITALS — BP 140/89 | HR 74 | Ht 72.0 in | Wt 198.0 lb

## 2021-12-18 DIAGNOSIS — I7143 Infrarenal abdominal aortic aneurysm, without rupture: Secondary | ICD-10-CM

## 2021-12-18 DIAGNOSIS — I10 Essential (primary) hypertension: Secondary | ICD-10-CM

## 2021-12-18 DIAGNOSIS — I708 Atherosclerosis of other arteries: Secondary | ICD-10-CM | POA: Diagnosis not present

## 2021-12-18 DIAGNOSIS — I739 Peripheral vascular disease, unspecified: Secondary | ICD-10-CM

## 2021-12-18 DIAGNOSIS — I2510 Atherosclerotic heart disease of native coronary artery without angina pectoris: Secondary | ICD-10-CM

## 2021-12-18 DIAGNOSIS — E785 Hyperlipidemia, unspecified: Secondary | ICD-10-CM

## 2021-12-18 DIAGNOSIS — I70211 Atherosclerosis of native arteries of extremities with intermittent claudication, right leg: Secondary | ICD-10-CM | POA: Diagnosis not present

## 2021-12-18 DIAGNOSIS — Z9889 Other specified postprocedural states: Secondary | ICD-10-CM | POA: Diagnosis not present

## 2021-12-18 NOTE — Progress Notes (Signed)
MRN : [id number]  Sergio Callahan is a 71 y.o. (January 16, 1951) male who presents with chief complaint of follow up circulation.  History of Present Illness:   Patient returns today in follow up of his PAD.  He is about 3 to 4 years status post iliac intervention for PAD.    Since that time, he has no disabling claudication, rest pain, or ulceration.  Noninvasive studies today are normal.    ABIs are 1.13 on the right and 1.11 on the left with triphasic waveforms and normal digital pressures.    Previous aorta measures 2.8 cm in maximal diameter so it is mildly ectatic but not frankly aneurysmal.  No outpatient medications have been marked as taking for the 12/18/21 encounter (Appointment) with Delana Meyer, Dolores Lory, MD.    Past Medical History:  Diagnosis Date   Abdominal aortic aneurysm without rupture    Anemia    takes B12 injections monthly   Anogenital (venereal) warts    Anxiety    takes Xanax daily as needed   Arthritis    back    Back pain    synovial cyst   Carpal tunnel syndrome    Celiac artery atherosclerosis    Collagen vascular disease (HCC)    Hx Lupus.   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    Hx of adenomatous colonic polyps    Hyperlipidemia    takes Pravastatin nightly   Hypertension    takes Lisinopril-HCTZ and Metoprolol daily   Hyponatremia    Iliac artery aneurysm (HCC)    Lupus (HCC)    takes Plaquenil and Imran daily   Nocturia    Numbness and tingling    left hip/leg/foot   Peripheral vascular disease (HCC)    Peripheral vascular disease (HCC)    Rotator cuff tendonitis    Urinary frequency     Past Surgical History:  Procedure Laterality Date   BIOPSY  08/28/2021    Procedure: BIOPSY;  Surgeon: Irving Copas., MD;  Location: [facility];  Service: Gastroenterology;;   Delphina Cahill LIFT Bilateral 01/27/2021   Procedure: BLEPHAROPLASTY UPPER EYELID; W/EXCESS SKIN BROW PTOSIS REPAIR AND BLEPHAROPTOSIS REPAIR; RESECT EX BILATERAL;  Surgeon: Karle Starch, MD;  Location: [place];  Service: Ophthalmology;  Laterality: Bilateral;   CARDIAC CATHETERIZATION  2012   COLONOSCOPY     COLONOSCOPY WITH PROPOFOL N/A 09/30/2019   Procedure: COLONOSCOPY WITH PROPOFOL;  Surgeon: Robert Bellow, MD;  Location: [facility];  Service: Endoscopy;  Laterality: N/A;   COLONOSCOPY WITH PROPOFOL N/A 05/05/2021   Procedure: COLONOSCOPY WITH PROPOFOL;  Surgeon: [name], MD;  Location: [facility];  Service: Endoscopy;  Laterality: N/A;   CORONARY ANGIOPLASTY     1 stent   ESOPHAGOGASTRODUODENOSCOPY     ESOPHAGOGASTRODUODENOSCOPY (EGD) WITH PROPOFOL N/A 05/05/2021   Procedure: ESOPHAGOGASTRODUODENOSCOPY (EGD) WITH PROPOFOL;  Surgeon: [name], MD;  Location: [facility];  Service: Endoscopy;  Laterality: N/A;   ESOPHAGOGASTRODUODENOSCOPY (EGD) WITH PROPOFOL N/A 08/28/2021   Procedure: ESOPHAGOGASTRODUODENOSCOPY (EGD) WITH PROPOFOL;  Surgeon: Rush Landmark Telford Nab., MD;  Location: [facility];  Service: Gastroenterology;  Laterality: N/A;   EUS N/A  08/28/2021   Procedure: UPPER ENDOSCOPIC ULTRASOUND (EUS) RADIAL;  Surgeon: Irving Copas., MD;  Location: [facility];  Service: Gastroenterology;  Laterality: N/A;   LUMBAR LAMINECTOMY/DECOMPRESSION MICRODISCECTOMY Left 02/03/2015   Procedure: LUMBAR LAMINECTOMY/DECOMPRESSION MICRODISCECTOMY LEFT LUMBAR FOUR-FIVE WITH REMOVAL OF SYNOVIAL CYST;  Surgeon: Newman Pies, MD;  Location: [facility];  Service: Neurosurgery;  Laterality: Left;   NASAL SEPTOPLASTY W/ TURBINOPLASTY Bilateral 10/23/2019   Procedure: NASAL SEPTOPLASTY WITH SUBMUCOUS RESECTION OF TURBINATE;  Surgeon:  Beverly Gust, MD;  Location: [place];  Service: ENT;  Laterality: Bilateral;   PELVIC ANGIOGRAPHY N/A 05/02/2018   Procedure: PELVIC ANGIOGRAPHY;  Surgeon: Katha Cabal, MD;  Location: [facility];  Service: Cardiovascular;  Laterality: N/A;   POLYPECTOMY  08/28/2021   Procedure: POLYPECTOMY;  Surgeon: Rush Landmark Telford Nab., MD;  Location: [facility];  Service: Gastroenterology;;   SHOULDER ARTHROSCOPY WITH SUBACROMIAL DECOMPRESSION AND BICEP TENDON REPAIR Right 12/04/2018   Procedure: SHOULDER ARTHROSCOPY WITH DEBRIDEMENT, DECOMPRESSION, BICEP TENODESIS;  Surgeon: Corky Mull, MD;  Location: [facility];  Service: Orthopedics;  Laterality: Right;   SUBMUCOSAL INJECTION  08/28/2021   Procedure: SUBMUCOSAL INJECTION;  Surgeon: Irving Copas., MD;  Location: [facility];  Service: Gastroenterology;;    Social History Social History   Tobacco Use   Smoking status: Former    Packs/day: 1.50    Years: 30.00    Pack years: 45.00    Types: Cigarettes    Quit date: 03/31/2021    Years since quitting: 0.7   Smokeless tobacco: Never  Vaping Use   Vaping Use: Never used  Substance Use Topics   Alcohol use: Yes    Comment: cocktails about 2 daily   Drug use: No    Family History Family History  Problem Relation Age of Onset   Leukemia Mother    Hyperlipidemia Father    Hypertension Father    Diabetes Father    Colon cancer Father    Hearing loss Father    Vascular Disease Father     Allergies  Allergen Reactions   Fosamax [Alendronate Sodium]     Leg pain   Percocet [Oxycodone-Acetaminophen] Nausea And Vomiting     REVIEW OF SYSTEMS (Negative unless checked)  Constitutional: [] Weight loss  [] Fever  [] Chills Cardiac: [] Chest pain   [] Chest pressure   [] Palpitations   [] Shortness of breath when laying flat   [] Shortness of breath with exertion. Vascular:  [] Pain in legs with walking   [] Pain in legs at rest  [] History of DVT    [] Phlebitis   [] Swelling in legs   [] Varicose veins   [] Non-healing ulcers Pulmonary:   [] Uses home oxygen   [] Productive cough   [] Hemoptysis   [] Wheeze  [] COPD   [] Asthma Neurologic:  [] Dizziness   [] Seizures   [] History of stroke   [] History of TIA  [] Aphasia   [] Vissual changes   [] Weakness or numbness in arm   [] Weakness or numbness in leg Musculoskeletal:   [] Joint swelling   [] Joint pain   [] Low back pain Hematologic:  [] Easy bruising  [] Easy bleeding   [] Hypercoagulable state   [] Anemic Gastrointestinal:  [] Diarrhea   [] Vomiting  [] Gastroesophageal reflux/heartburn   [] Difficulty swallowing. Genitourinary:  [] Chronic kidney disease   [] Difficult urination  [] Frequent urination   [] Blood in urine Skin:  [] Rashes   [] Ulcers  Psychological:  [] History of anxiety   []  History of major depression.  Physical Examination  There were no vitals filed for this visit. There is no  height or weight on file to calculate BMI. Gen: WD/WN, NAD Head: [place]/AT, No temporalis wasting.  Ear/Nose/Throat: Hearing grossly intact, nares w/o erythema or drainage Eyes: PER, EOMI, sclera nonicteric.  Neck: Supple, no masses.  No bruit or JVD.  Pulmonary:  Good air movement, no audible wheezing, no use of accessory muscles.  Cardiac: RRR, normal S1, S2, no Murmurs. Vascular:   No carotid bruits Vessel Right Left  Radial Palpable Palpable  Carotid Palpable Palpable  PT Palpable Palpable  DP Palpable Palpable  Gastrointestinal: soft, non-distended. No guarding/no peritoneal signs.  Musculoskeletal: M/S 5/5 throughout.  No visible deformity.  Neurologic: CN 2-12 intact. Pain and light touch intact in extremities.  Symmetrical.  Speech is fluent. Motor exam as listed above. Psychiatric: Judgment intact, Mood & affect appropriate for pt's clinical situation. Dermatologic: No rashes or ulcers noted.  No changes consistent with cellulitis.   CBC Lab Results  Component Value Date   WBC 5.2 11/28/2021   HGB  15.2 11/28/2021   HCT 43.7 11/28/2021   MCV 95.4 11/28/2021   PLT 251 11/28/2021    BMET    Component Value Date/Time   NA 136 11/28/2021 1340   K 4.1 11/28/2021 1340   CL 100 11/28/2021 1340   CO2 29 11/28/2021 1340   GLUCOSE 89 11/28/2021 1340   BUN 12 11/28/2021 1340   CREATININE 0.94 11/28/2021 1340   CALCIUM 9.0 11/28/2021 1340   GFRNONAA >60 11/28/2021 1340   GFRAA >60 05/02/2018 1209   Estimated Creatinine Clearance: 82.6 mL/min (by C-G formula based on SCr of 0.94 mg/dL).  COAG No results found for: INR, PROTIME  Radiology No results found.   Assessment/Plan  1. Atherosclerosis of native artery of right lower extremity with intermittent claudication (HCC)  Recommend:  The patient has evidence of atherosclerosis of the lower extremities with claudication.  The patient does not voice lifestyle limiting changes at this point in time.  Noninvasive studies do not suggest clinically significant change.  No invasive studies, angiography or surgery at this time The patient should continue walking and begin a more formal exercise program.  The patient should continue antiplatelet therapy and aggressive treatment of the lipid abnormalities  No changes in the patient's medications at this time  - VAS [place] ABI WITH/WO TBI; Future  2. Infrarenal abdominal aortic aneurysm (AAA) without rupture No surgery or intervention at this time. The patient has an asymptomatic abdominal aortic aneurysm that is less than 4 cm in maximal diameter.  I have discussed the natural history of abdominal aortic aneurysm and the small risk of rupture for aneurysm less than 5 cm in size.  However, as these small aneurysms tend to enlarge over time, continued surveillance with ultrasound or CT scan is mandatory.  I have also discussed optimizing medical management with hypertension and lipid control and the importance of abstinence from tobacco.  The patient is also encouraged to exercise a minimum  of 30 minutes 4 times a week.  Should the patient develop new onset abdominal or back pain or signs of peripheral embolization they are instructed to seek medical attention immediately and to alert the physician providing care that they have an aneurysm.  The patient voices their understanding. The patient will return in 24 months with an aortic duplex.   3. Celiac artery atherosclerosis Remains asymptomatic therefore no intervention indicated at this time  4. Essential hypertension Continue antihypertensive medications as already ordered, these medications have been reviewed and there are no changes at this  time.   5. Coronary arteriosclerosis in native artery Continue cardiac and antihypertensive medications as already ordered and reviewed, no changes at this time.  Continue statin as ordered and reviewed, no changes at this time  Nitrates PRN for chest pain   6. Hyperlipidemia, unspecified hyperlipidemia type Continue statin as ordered and reviewed, no changes at this time     Hortencia Pilar, MD  12/18/2021 8:28 AM

## 2021-12-22 ENCOUNTER — Encounter: Payer: Self-pay | Admitting: Oncology

## 2021-12-22 ENCOUNTER — Other Ambulatory Visit: Payer: Self-pay | Admitting: Oncology

## 2021-12-22 DIAGNOSIS — C7A8 Other malignant neuroendocrine tumors: Secondary | ICD-10-CM

## 2022-01-24 ENCOUNTER — Ambulatory Visit: Payer: Medicare Other | Admitting: Urology

## 2022-01-25 ENCOUNTER — Ambulatory Visit: Payer: Self-pay | Admitting: Urology

## 2022-02-05 ENCOUNTER — Other Ambulatory Visit: Payer: Self-pay

## 2022-02-05 ENCOUNTER — Ambulatory Visit (INDEPENDENT_AMBULATORY_CARE_PROVIDER_SITE_OTHER): Payer: Medicare Other | Admitting: Urology

## 2022-02-05 ENCOUNTER — Encounter: Payer: Self-pay | Admitting: Urology

## 2022-02-05 VITALS — BP 134/80 | HR 70 | Ht 72.0 in | Wt 194.8 lb

## 2022-02-05 DIAGNOSIS — N529 Male erectile dysfunction, unspecified: Secondary | ICD-10-CM | POA: Diagnosis not present

## 2022-02-05 DIAGNOSIS — N401 Enlarged prostate with lower urinary tract symptoms: Secondary | ICD-10-CM

## 2022-02-05 DIAGNOSIS — N138 Other obstructive and reflux uropathy: Secondary | ICD-10-CM

## 2022-02-05 DIAGNOSIS — R3121 Asymptomatic microscopic hematuria: Secondary | ICD-10-CM

## 2022-02-05 LAB — BLADDER SCAN AMB NON-IMAGING

## 2022-02-05 MED ORDER — TADALAFIL 10 MG PO TABS
10.0000 mg | ORAL_TABLET | Freq: Every day | ORAL | 11 refills | Status: DC | PRN
Start: 2022-02-05 — End: 2023-02-14

## 2022-02-05 MED ORDER — TAMSULOSIN HCL 0.4 MG PO CAPS: 0.4000 mg | ORAL_CAPSULE | Freq: Every day | ORAL | 11 refills | Status: DC

## 2022-02-05 NOTE — Patient Instructions (Signed)
Prostatic Urethral Lift ?Prostatic urethral lift is a surgical procedure to treat symptoms of prostate gland enlargement that occurs with age (benign prostatic hypertrophy, BPH). ?The urethra passes between the two lobes of the prostate. The urethra is the part of the body that drains urine from the bladder. As the prostate enlarges, it can push on the urethra and cause problems with urinating. This procedure involves placing an implant that holds the prostate away from the urethra. The procedure is done using a thin device called a cystoscope. The device is inserted through the tip of the penis and moved up the urethra to the prostate. This is less invasive than other procedures that require an incision. ?You may have this procedure if: ?You have symptoms of BPH. ?Your prostate is not severely enlarged. ?Medicines to treat BPH are not working or not tolerated. ?You want to avoid possible sexual side effects from medicines or other procedures that are used to treat BPH. ?Tell a health care provider about: ?Any allergies you have. ?All medicines you are taking, including vitamins, herbs, eye drops, creams, and over-the-counter medicines. ?Any problems you or family members have had with anesthetic medicines. ?Any bleeding problems you have. ?Any surgeries you have had. ?Any medical conditions you have. ?What are the risks? ?Generally, this is a safe procedure. However, problems may occur, including: ?Bleeding. ?Infection. ?Leaking of urine (incontinence). ?Allergic reactions to medicines. ?Return of BPH symptoms after 2 years, requiring more treatment. ?What happens before the procedure? ?When to stop eating and drinking ?Follow instructions from your health care provider about what you may eat and drink before your procedure. These may include: ?8 hours before your procedure ?Stop eating most foods. Do not eat meat, fried foods, or fatty foods. ?Eat only light foods, such as toast or crackers. ?All liquids are okay  except energy drinks and alcohol. ?6 hours before your procedure ?Stop eating. ?Drink only clear liquids, such as water, clear fruit juice, black coffee, plain tea, and sports drinks. ?Do not drink energy drinks or alcohol. ?2 hours before your procedure ?Stop drinking all liquids. ?You may be allowed to take medicines with small sips of water. ?If you do not follow your health care provider's instructions, your procedure may be delayed or canceled. ?Medicines ?Ask your health care provider about: ?Changing or stopping your regular medicines. This is especially important if you are taking diabetes medicines or blood thinners. ?Taking medicines such as aspirin and ibuprofen. These medicines can thin your blood. Do not take these medicines unless your health care provider tells you to take them. ?Taking over-the-counter medicines, vitamins, herbs, and supplements. ?Surgery safety ?Ask your health care provider what steps will be taken to help prevent infection. These steps may include: ?Removing hair at the surgery site. ?Washing skin with a germ-killing soap. ?Taking antibiotic medicine. ?General instructions ?Do not use any products that contain nicotine or tobacco for at least 4 weeks before the procedure. These products include cigarettes, chewing tobacco, and vaping devices, such as e-cigarettes. If you need help quitting, ask your health care provider. ?If you will be going home right after the procedure, plan to have a responsible adult: ?Take you home from the hospital or clinic. You will not be allowed to drive. ?Care for you for the time you are told. ?What happens during the procedure? ?An IV may be inserted into one of your veins. ?You will be given one or more of the following: ?A medicine to help you relax (sedative). ?A medicine that is  injected into your urethra to numb the area (local anesthetic). ?A medicine to make you fall asleep (general anesthetic). ?A cystoscope will be inserted into your penis  and moved through your urethra to your prostate. ?A device will be inserted through the cystoscope and used to press the lobes of your prostate away from your urethra. ?Implants will be inserted through the device to hold the lobes of your prostate in the widened position. ?The device and cystoscope will be removed. ?The procedure may vary among health care providers and hospitals. ?What happens after the procedure? ?Your blood pressure, heart rate, breathing rate, and blood oxygen level be monitored until you leave the hospital or clinic. ?If you were given a sedative during the procedure, it can affect you for several hours. Do not drive or operate machinery until your health care provider says that it is safe. ?Summary ?Prostatic urethral lift is a surgical procedure to relieve symptoms of prostate gland enlargement that occurs with age (benign prostatic hypertrophy, BPH). ?The procedure is performed with a thin device called a cystoscope. This device is inserted through the tip of the penis and moved up the urethra to reach the prostate. This is less invasive than other procedures that require an incision. ?If you will be going home right after the procedure, plan to have a responsible adult take you home from the hospital or clinic. You will not be allowed to drive. ?This information is not intended to replace advice given to you by your health care provider. Make sure you discuss any questions you have with your health care provider. ?Document Revised: 05/26/2021 Document Reviewed: 05/26/2021 ?Elsevier Patient Education ? [place]. ? ?Tadalafil Tablets (Erectile Dysfunction, BPH) ?What is this medication? ?TADALAFIL (tah DA la fil) treats erectile dysfunction (ED). It works by increasing blood flow to the penis, which helps to maintain an erection. It may also be used to treat symptoms of an enlarged prostate (benign prostatic hyperplasia). ?This medicine may be used for other purposes; ask your  health care provider or pharmacist if you have questions. ?COMMON BRAND NAME(S): Kathaleen Bury, Cialis ?What should I tell my care team before I take this medication? ?They need to know if you have any of these conditions: ?Anatomical deformation of the penis, Peyronie's disease, or history of priapism (painful and prolonged erection) ?Bleeding disorders ?Eye or vision problems, including a rare inherited eye disease called retinitis pigmentosa ?Heart disease, angina, a history of heart attack, irregular heart beats, or other heart problems ?High or low blood pressure ?History of blood diseases, like sickle cell anemia or leukemia ?History of stomach bleeding ?Kidney disease ?Liver disease ?Stroke ?An unusual or allergic reaction to tadalafil, other medications, foods, dyes, or preservatives ?Pregnant or trying to get pregnant ?Breast-feeding ?How should I use this medication? ?Take this medication by mouth with a glass of water. Follow the directions on the prescription label. You may take this medication with or without meals. When this medication is used for erection problems, your care team may prescribe it to be taken once daily or as needed. If you are taking the medication as needed, you may be able to have sexual activity 30 minutes after taking it and for up to 36 hours after taking it. Whether you are taking the medication as needed or once daily, you should not take more than one dose per day. If you are taking this medication for symptoms of benign prostatic hyperplasia (BPH) or to treat both BPH and an erection problem,  take the dose once daily at about the same time each day. Do not take your medication more often than directed. ?Talk to your care team about the use of this medication in children. Special care may be needed. ?Overdosage: If you think you have taken too much of this medicine contact a poison control center or emergency room at once. ?NOTE: This medicine is only for you. Do not share  this medicine with others. ?What if I miss a dose? ?If you are taking this medication as needed for erection problems, this does not apply. If you miss a dose while taking this medication once daily for an er

## 2022-02-05 NOTE — Progress Notes (Signed)
? ?  02/05/2022 ?10:32 AM  ? ?Luisa Hart ?May 10, 1951 ?[id number] ? ?Reason for visit: Follow up BPH, microscopic hematuria, history of penile warts, ED, PSA screening ? ?HPI: ?71 year old male here for follow-up of the above issues.  He had a microscopic hematuria work-up in March 2022 with a normal renal ultrasound, as well as a normal cystoscopy with moderate size prostate and obstructing lateral lobes, normal-appearing bladder with mild trabeculations.  Prostate measured 50 g on CT from 2019.  PSA was normal in January 2023 at 0.9 urinalysis in February 2023 showed mild persistent microscopic hematuria with 4-10 RBCs.  He also was started on finasteride in the summer 2022 by his PCP, he is not sure if this has significantly changed his urinary symptoms.  IPSS score today is 13, with quality of life mixed.  PVR is normal at 55 mL.  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 ? ?He also reports problems with erections, and he has back pain and flushing with sildenafil.  He is interested in trying other medications.  I recommended a trial of Cialis 10 to 20 mg on demand for ED, and risks and benefits were discussed. ? ?He has a history of penile warts, and has a small recurrence but has follow-up already arranged with a dermatologist at [facility] for removal. ? ?Regarding his urinary symptoms we discussed continuing maximal medical therapy with Flomax and finasteride, or consideration of an outlet procedure like UroLift.  Extensive patient information was provided, and he would like to think about this moving forward. ? ?Trial of Cialis 10 to 20 mg on demand for ED ?Okay to schedule UroLift if patient desires, patient information given ?RTC 1 year IPSS, PVR ? ? ?[name], MD ? ?[place] ?[address], [address] ?[place], [place] [address] ?[phone] ? ?

## 2022-02-16 IMAGING — US US BREAST*R* LIMITED INC AXILLA
1 series · 6 of 6 positions shown · non-contrast
Comparison: None.

CLINICAL DATA: 69-year-old male presenting with a new lump and
tenderness in the retroareolar left breast for approximately 3
weeks. Recently diagnosed with gastric neuroendocrine tumor.

EXAM:
DIGITAL DIAGNOSTIC BILATERAL MAMMOGRAM WITH TOMOSYNTHESIS AND CAD;
ULTRASOUND RIGHT BREAST LIMITED
TECHNIQUE: Bilateral digital diagnostic mammography and breast tomosynthesis
was performed. The images were evaluated with computer-aided
detection.; Targeted ultrasound examination of the right breast was
performed

[Series 1: us breast*right* limited inc axilla · 0.07mm/px · 6 of 6 slices shown]
[im 1/6]
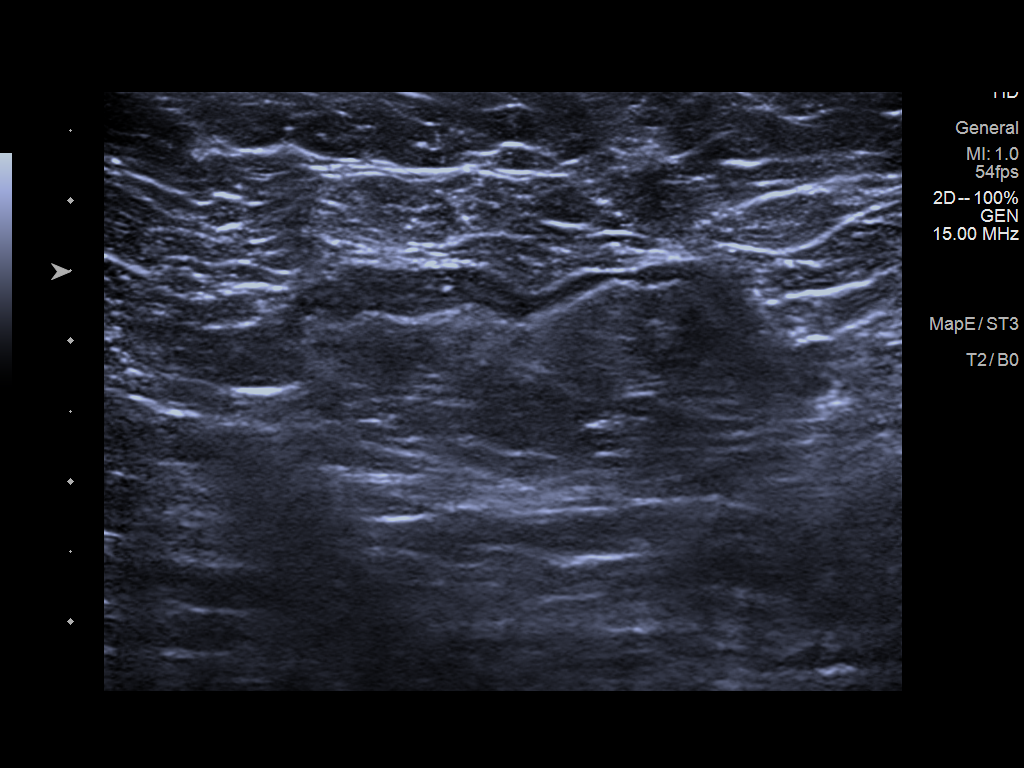
[im 2/6]
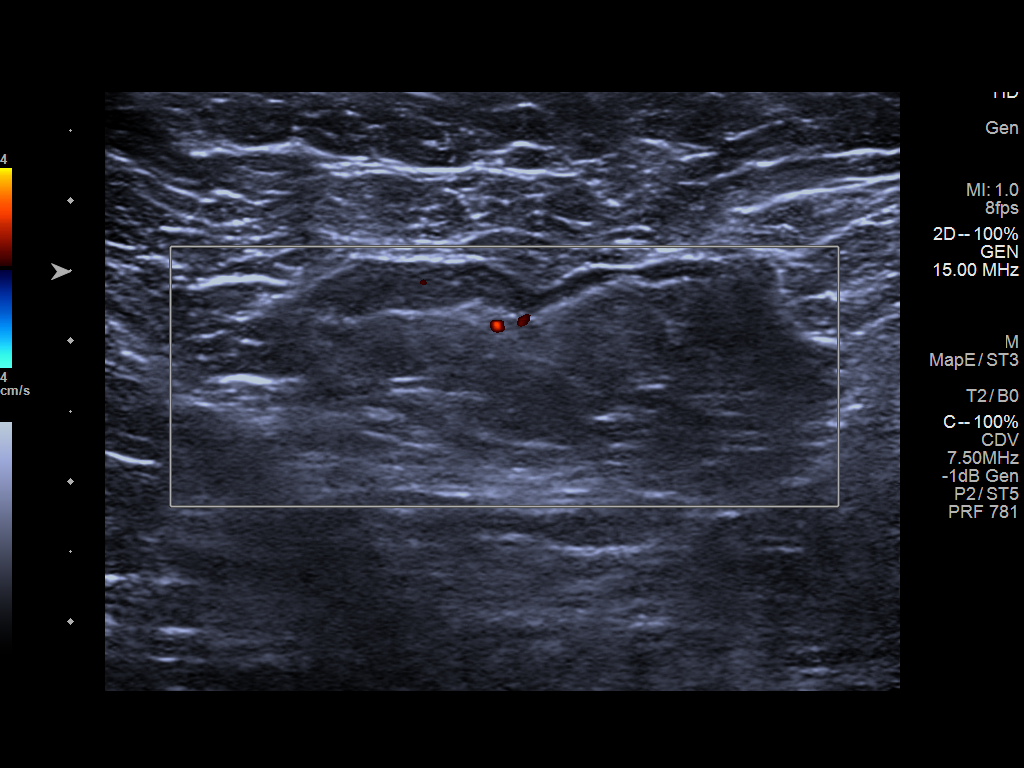
[im 3/6]
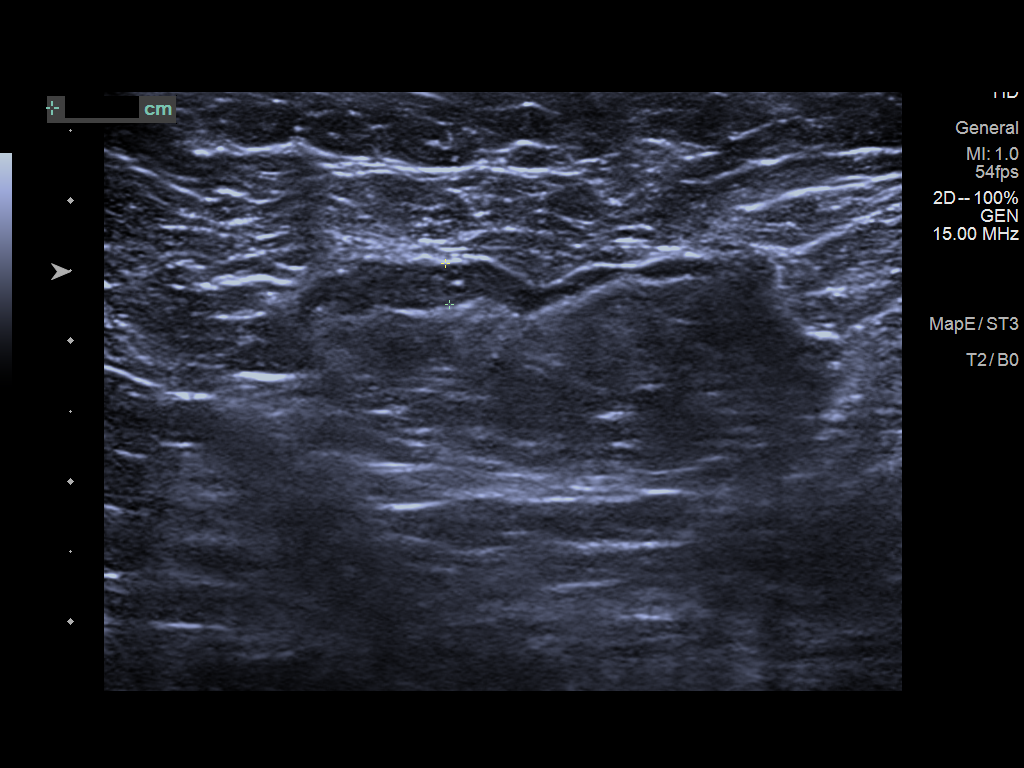
[im 4/6]
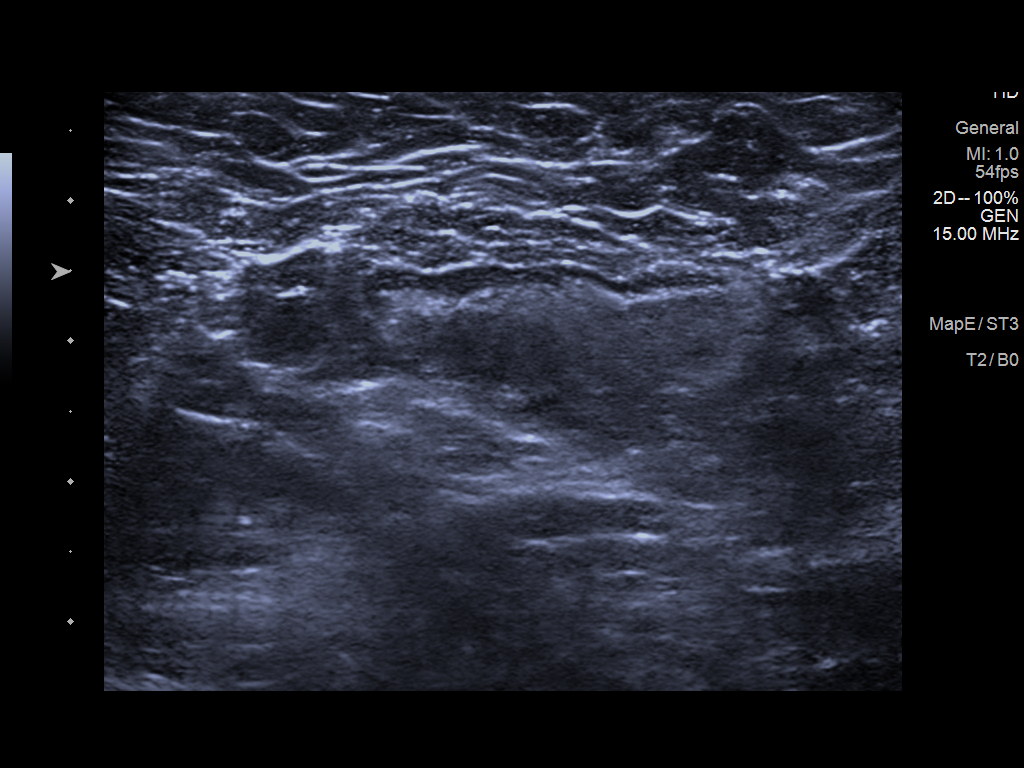
[im 5/6]
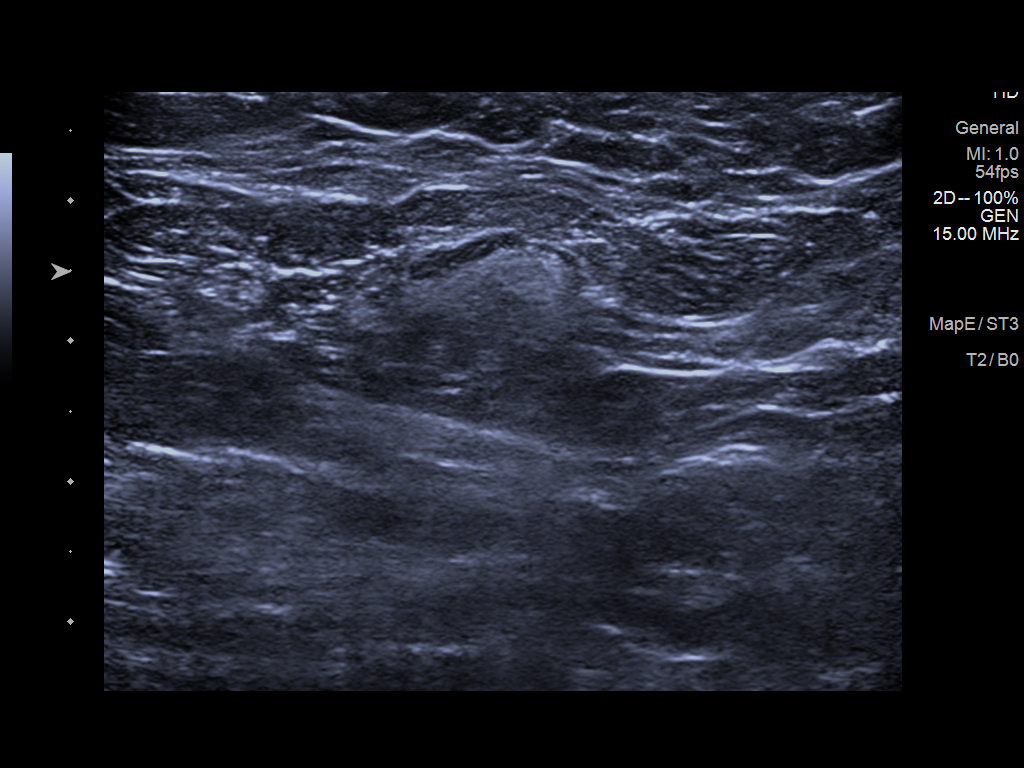
[im 6/6]
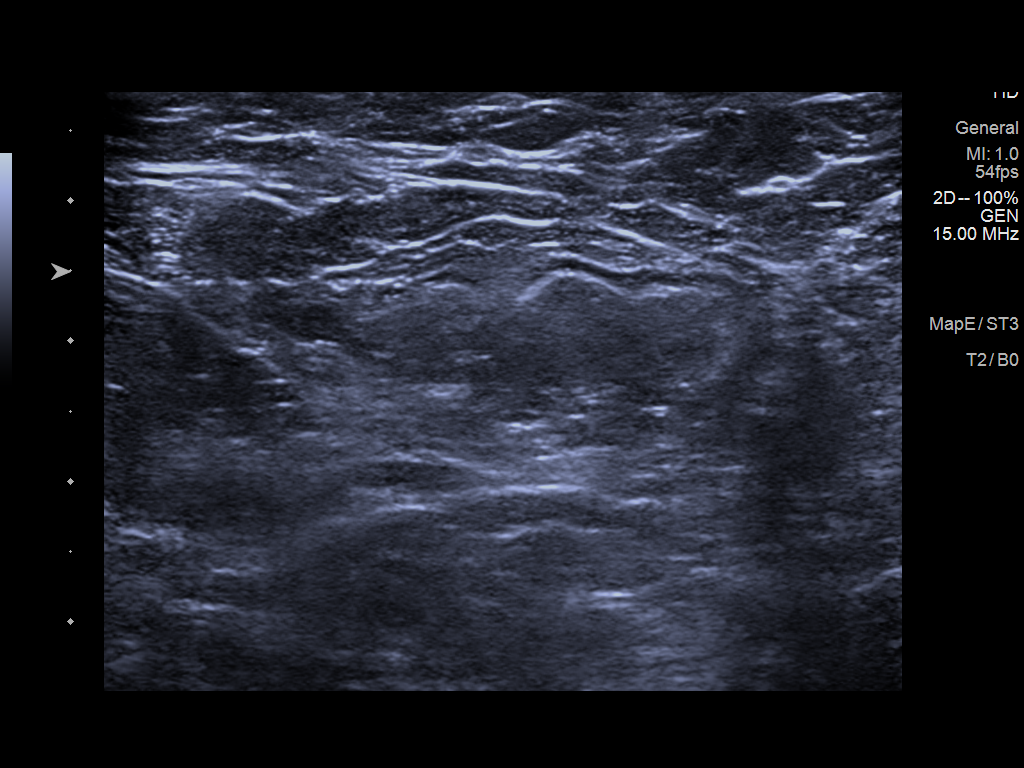

[6 of 6 positions shown; findings below may reference images not displayed]

ACR Breast Density Category b: There are scattered areas of
fibroglandular density.
FINDINGS: Mammogram:

Right breast: No suspicious mass, distortion, or microcalcifications
are identified to suggest presence of malignancy. There is a very
small amount of glandular tissue in the retroareolar aspect of the
breast.

In the right axilla there is a prominent lymph node.

Left breast: A skin BB marks the site of concern reported by the
patient in the retroareolar left breast. A spot tangential view of
this area is performed in addition to standard views. There is a
mild amount of glandular tissue in the retroareolar aspect of the
breast.

On physical exam at site of concern reported by the patient in the
retroareolar left breast I feel a firm area but no fixed discrete
mass. There are no obvious skin changes.

Ultrasound:

Targeted ultrasound performed of the right axilla demonstrating a
prominent lymph node with normal morphology and retained fatty hilum
with cortical thickness measuring 0.3 cm, within normal limits. This
corresponds to the mammographic finding.
IMPRESSION: 1.  Benign left greater than right gynecomastia.

2.  Normal right axillary ultrasound.

RECOMMENDATION:
Clinical follow-up as needed for gynecomastia.

I discussed with the patient the fact that gynecomastia can occur in
men as testosterone levels decrease with age or in younger men with
low testosterone levels, causing a change in the serum
testosterone:estrogen ratio. We also discussed other potential
etiologies of gynecomastia including numerous prescription
medications and recreational drugs (marijuana and anabolic steroids
in particular). Approximately 20% of cases of gynecomastia are
idiopathic.

We also discussed the possibility of surgical excision if symptoms
continue and if an etiology of the gynecomastia cannot be determined
and therefore corrected.

BI-RADS CATEGORY  2: Benign.

## 2022-04-02 ENCOUNTER — Telehealth (INDEPENDENT_AMBULATORY_CARE_PROVIDER_SITE_OTHER): Payer: Self-pay | Admitting: Vascular Surgery

## 2022-04-02 NOTE — Telephone Encounter (Signed)
LVM for pt regarding imaging report we received from [facility]. After consulting with Dr. Delana Meyer, I advised pt (via VM) that Dr. Delana Meyer is ok with the imaging as the Meriel Pica is the same size as when patient was last here in February, 2023. I advised pt to call us with any questions.

## 2022-06-04 ENCOUNTER — Inpatient Hospital Stay: Payer: Medicare Other | Attending: Oncology

## 2022-06-04 DIAGNOSIS — Z809 Family history of malignant neoplasm, unspecified: Secondary | ICD-10-CM

## 2022-06-04 DIAGNOSIS — C169 Malignant neoplasm of stomach, unspecified: Secondary | ICD-10-CM | POA: Diagnosis not present

## 2022-06-04 DIAGNOSIS — E871 Hypo-osmolality and hyponatremia: Secondary | ICD-10-CM | POA: Insufficient documentation

## 2022-06-04 DIAGNOSIS — D472 Monoclonal gammopathy: Secondary | ICD-10-CM | POA: Insufficient documentation

## 2022-06-04 DIAGNOSIS — K317 Polyp of stomach and duodenum: Secondary | ICD-10-CM | POA: Diagnosis not present

## 2022-06-04 DIAGNOSIS — Z800 Family history of malignant neoplasm of digestive organs: Secondary | ICD-10-CM | POA: Insufficient documentation

## 2022-06-04 DIAGNOSIS — C7A8 Other malignant neuroendocrine tumors: Secondary | ICD-10-CM

## 2022-06-04 LAB — CBC WITH DIFFERENTIAL/PLATELET
Abs Immature Granulocytes: 0.03 10*3/uL (ref 0.00–0.07)
Basophils Absolute: 0 10*3/uL (ref 0.0–0.1)
Basophils Relative: 1 %
Eosinophils Absolute: 0.1 10*3/uL (ref 0.0–0.5)
Eosinophils Relative: 2 %
HCT: 43.8 % (ref 39.0–52.0)
Hemoglobin: 15.3 g/dL (ref 13.0–17.0)
Immature Granulocytes: 1 %
Lymphocytes Relative: 20 %
Lymphs Abs: 1 10*3/uL (ref 0.7–4.0)
MCH: 33.6 pg (ref 26.0–34.0)
MCHC: 34.9 g/dL (ref 30.0–36.0)
MCV: 96.1 fL (ref 80.0–100.0)
Monocytes Absolute: 0.5 10*3/uL (ref 0.1–1.0)
Monocytes Relative: 9 %
Neutro Abs: 3.4 10*3/uL (ref 1.7–7.7)
Neutrophils Relative %: 67 %
Platelets: 226 10*3/uL (ref 150–400)
RBC: 4.56 MIL/uL (ref 4.22–5.81)
RDW: 13.1 % (ref 11.5–15.5)
WBC: 5 10*3/uL (ref 4.0–10.5)
nRBC: 0 % (ref 0.0–0.2)

## 2022-06-04 LAB — COMPREHENSIVE METABOLIC PANEL
ALT: 34 U/L (ref 0–44)
AST: 32 U/L (ref 15–41)
Albumin: 4.2 g/dL (ref 3.5–5.0)
Alkaline Phosphatase: 64 U/L (ref 38–126)
Anion gap: 4 — ABNORMAL LOW (ref 5–15)
BUN: 19 mg/dL (ref 8–23)
CO2: 23 mmol/L (ref 22–32)
Calcium: 8.4 mg/dL — ABNORMAL LOW (ref 8.9–10.3)
Chloride: 105 mmol/L (ref 98–111)
Creatinine, Ser: 0.79 mg/dL (ref 0.61–1.24)
GFR, Estimated: >60 mL/min (ref >60)
Glucose, Bld: 110 mg/dL — ABNORMAL HIGH (ref 70–99)
Potassium: 3.6 mmol/L (ref 3.5–5.1)
Sodium: 132 mmol/L — ABNORMAL LOW (ref 135–145)
Total Bilirubin: 0.9 mg/dL (ref 0.3–1.2)
Total Protein: 7.3 g/dL (ref 6.5–8.1)

## 2022-06-04 LAB — VITAMIN B12: Vitamin B-12: 267 pg/mL (ref 180–914)

## 2022-06-05 LAB — KAPPA/LAMBDA LIGHT CHAINS
Kappa free light chain: 21.4 mg/L — ABNORMAL HIGH (ref 3.3–19.4)
Kappa, lambda light chain ratio: 1.52 (ref 0.26–1.65)
Lambda free light chains: 14.1 mg/L (ref 5.7–26.3)

## 2022-06-06 ENCOUNTER — Other Ambulatory Visit: Payer: Self-pay

## 2022-06-06 DIAGNOSIS — C7A8 Other malignant neuroendocrine tumors: Secondary | ICD-10-CM

## 2022-06-06 DIAGNOSIS — C169 Malignant neoplasm of stomach, unspecified: Secondary | ICD-10-CM | POA: Diagnosis not present

## 2022-06-06 LAB — GASTRIN: Gastrin: 88 pg/mL (ref 0–115)

## 2022-06-06 LAB — CHROMOGRANIN A: Chromogranin A (ng/mL): 542.6 ng/mL — ABNORMAL HIGH (ref 0.0–101.8)

## 2022-06-08 LAB — MULTIPLE MYELOMA PANEL, SERUM
Albumin SerPl Elph-Mcnc: 3.6 g/dL (ref 2.9–4.4)
Albumin/Glob SerPl: 1.4 (ref 0.7–1.7)
Alpha 1: 0.2 g/dL (ref 0.0–0.4)
Alpha2 Glob SerPl Elph-Mcnc: 0.7 g/dL (ref 0.4–1.0)
B-Globulin SerPl Elph-Mcnc: 0.9 g/dL (ref 0.7–1.3)
Gamma Glob SerPl Elph-Mcnc: 0.9 g/dL (ref 0.4–1.8)
Globulin, Total: 2.7 g/dL (ref 2.2–3.9)
IgA: 198 mg/dL (ref 61–437)
IgG (Immunoglobin G), Serum: 935 mg/dL (ref 603–1613)
IgM (Immunoglobulin M), Srm: 98 mg/dL (ref 20–172)
M Protein SerPl Elph-Mcnc: 0.4 g/dL — ABNORMAL HIGH
Total Protein ELP: 6.3 g/dL (ref 6.0–8.5)

## 2022-06-08 LAB — 5 HIAA, QUANTITATIVE, URINE, 24 HOUR
5-HIAA, Ur: 2.1 mg/L
5-HIAA,Quant.,24 Hr Urine: 5.6 mg/24 hr (ref 0.0–14.9)
Total Volume: 2650

## 2022-06-11 ENCOUNTER — Encounter: Payer: Self-pay | Admitting: Oncology

## 2022-06-11 ENCOUNTER — Inpatient Hospital Stay (HOSPITAL_BASED_OUTPATIENT_CLINIC_OR_DEPARTMENT_OTHER): Payer: Medicare Other | Admitting: Oncology

## 2022-06-11 VITALS — BP 143/87 | HR 85 | Temp 98.1°F | Ht 72.0 in | Wt 196.0 lb

## 2022-06-11 DIAGNOSIS — D472 Monoclonal gammopathy: Secondary | ICD-10-CM | POA: Diagnosis not present

## 2022-06-11 DIAGNOSIS — C169 Malignant neoplasm of stomach, unspecified: Secondary | ICD-10-CM | POA: Diagnosis not present

## 2022-06-11 DIAGNOSIS — Z809 Family history of malignant neoplasm, unspecified: Secondary | ICD-10-CM

## 2022-06-11 DIAGNOSIS — E871 Hypo-osmolality and hyponatremia: Secondary | ICD-10-CM

## 2022-06-11 DIAGNOSIS — C7A8 Other malignant neuroendocrine tumors: Secondary | ICD-10-CM

## 2022-06-11 NOTE — Progress Notes (Signed)
Hematology/Oncology Progress note [phone] [phone]      Patient Care Team: Baxter Hire, MD as PCP - General (Internal Medicine) Clent Jacks, RN as Oncology Nurse Navigator  REFERRING PROVIDER: Baxter Hire, MD  CHIEF COMPLAINTS/REASON FOR VISIT:  gastric neuroendocrine carcinoma  HISTORY OF PRESENTING ILLNESS:   Sergio Callahan is a  71 y.o.  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  Was referred to establish with gastroenterology. 05/05/2021, patient underwent EGD and colonoscopy by Dr. Haig Prophet. EGD showed gastritis, erythematous mucosa in the gastric fundus, multiple fundic gland polyps, resected and retrieved. Colonoscopy showed multiple colon polyps, resected and retrieved.  Diverticulosis.  Erythematous mucosa in the sigmoid colon.  CASE: [id number]  PATIENT: Sergio Callahan  Surgical Pathology Report  Specimen Submitted:  A. Stomach; cbx  B. Stomach polyp; cbx  C. Stomach, abnormal mucosa; cbx  D. Stomach polyp, inflammatory; cold snare  E. Colon polyp, rectosigmoid; cbx  F. Colon polyp, cecum; cbx  G. Colon polyp x2, transverse; cold snare (1) and cbx (1)  H. Colon, sigmoid, abnormal mucosa; cbx  I. Colon polyp, sigmoid; cbx   Clinical History: GERD, heme positive. Findings: Gastric polyps,  gastritis, colon polyps    DIAGNOSIS:  A.  STOMACH; COLD BIOPSY:  - ANTRAL-TYPE MUCOSA WITH REACTIVE FOVEOLAR HYPERPLASIA.  - OXYNTIC MUCOSA WITH PROTON PUMP INHIBITOR EFFECT.  - NEGATIVE FOR H. PYLORI, INTESTINAL METAPLASIA, DYSPLASIA, AND  MALIGNANCY.   B.  STOMACH POLYP; COLD BIOPSY:  - FUNDIC GLAND POLYP.  - NEGATIVE FOR DYSPLASIA AND MALIGNANCY.   C.  STOMACH, ABNORMAL MUCOSA; COLD BIOPSY:  - OXYNTIC-TYPE MUCOSA WITH REACTIVE FOVEOLAR HYPERPLASIA, HEMORRHAGE,   AND STROMAL CHANGES SUGGESTIVE OF HEALED MUCOSAL INJURY.  - NEGATIVE FOR INTESTINAL METAPLASIA, DYSPLASIA, AND MALIGNANCY.   D.  STOMACH POLYP, "INFLAMMATORY"; COLD SNARE:  - WELL-DIFFERENTIATED NEUROENDOCRINE TUMOR (NET G2), AT LEAST 5 MM IN  SIZE, SEE COMMENT.  - TUMOR INVOLVES THE MUSCULARIS MUCOSAE AND EXTENDS TO THE INKED DEEP  MARGIN.   E.  COLON POLYP, RECTOSIGMOID; COLD BIOPSY:  - HYPERPLASTIC POLYP.  - NEGATIVE FOR DYSPLASIA AND MALIGNANCY.   F.  COLON POLYP, CECUM; COLD BIOPSY:  - TUBULAR ADENOMA.  - NEGATIVE FOR HIGH-GRADE DYSPLASIA AND MALIGNANCY.   G.  COLON POLYP X 2; COLD SNARE (1) AND COLD BIOPSY (1):  - TUBULAR ADENOMA.  - NEGATIVE FOR HIGH-GRADE DYSPLASIA AND MALIGNANCY.  - ONLY 1 PIECE OF TISSUE FOUND IN THE CONTAINER.   H.  SIGMOID COLON, ABNORMAL MUCOSA; COLD BIOPSY:  - COLONIC MUCOSA WITH INTACT CRYPT ARCHITECTURE.  - LYMPHOID AGGREGATES AND CONGESTION.  - NEGATIVE FOR DYSPLASIA AND MALIGNANCY.   I.  COLON POLYP, SIGMOID; COLD BIOPSY:  - HYPERPLASTIC POLYP.  - NEGATIVE FOR DYSPLASIA AND MALIGNANCY.   Patient denies any stomach discomfort today. He is on omeprazole 20 mg daily chronically.  He drinks alcohol on some days. Lupus, follows up with Dr. Jefm Bryant.  He is on Imuran and Plaquenil Denies any unintentional weight loss, night sweats, diarrhea, facial flushing, fever.   Gastrin level was initially elevated on  with a level of 759, repeat after holding PPI for 2 weeks on 07/10/2021 showed a normalized level of 76.   06/20/2021 Dotatate PET scan showed no focal radial tracer activity within the stomach to localize well differentiated neuroendocrine tumor.  Diffuse physiological activity throughout the stomach.  No evidence of well-differentiated metastatic neuroendocrine tumor or adenopathy.  08/28/2021, patient underwent EUS by Dr.Mansouraty FINAL MICROSCOPIC DIAGNOSIS:  A. STOMACH, BODY, POLYPECTOMY:  - Fundic gland polyp  - Negative for dysplasia   B. STOMACH, ANTRUM, BIOPSY:  - Gastric antral mucosa with mild chronic gastritis  - Warthin Starry stain is negative for Helicobacter pylori  C. STOMACH, INCISURA, BIOPSY:  - Gastric oxyntic mucosa with parietal cell hyperplasia as can be seen  in hypergastrinemic states such as PPI therapy.  - Negative for atrophy or intestinal metaplasia  D. STOMACH, GREATER CURVE, BIOPSY:  - Gastric oxyntic mucosa with parietal cell hyperplasia as can be seen  in hypergastrinemic states such as PPI therapy.  - Negative for atrophy or intestinal metaplasia  E. STOMACH, LESSER CURVE, BIOPSY:  - Gastric oxyntic mucosa with parietal cell hyperplasia as can be seen  in hypergastrinemic states such as PPI therapy.  - Negative for atrophy or intestinal metaplasia  F. STOMACH, CARDIA AND FUNDUS, BIOPSY:  - Gastric oxyntic mucosa with parietal cell hyperplasia as can be seen  in hypergastrinemic states such as PPI therapy.  - Negative for atrophy or intestinal metaplasia  For gynecomastia work-up.  Patient underwent bilateral diagnostic mammogram on 06/12/2021.  Benign left greater than right gynecomastia.  Normal right axillary ultrasound. Patient underwent left breast vacuum-assisted core needle biopsy recommended by Dr. Bary Castilla, pathology showed benign mammary parenchyma with gynecomastia.  Negative for atypical proliferation breast disease.  INTERVAL HISTORY Sergio Callahan is a 71 y.o. male who has above history reviewed by me today presents for follow up visit for gastric neuroendocrine carcinoma, MGUS.  For neuroendocrine carcinoma, patient has been on surveillance protocol with gastroenterology.  Patient gets EGD every 8 to 10 months.  Last EGD was on 08/28/2021.  Patient has next EGD scheduled in October 2023. He has no new complaints.  Continues to take PPI for acid reflux.  He tried to hold PPI 1 week prior to his blood work. Review of Systems  Constitutional:  Negative for appetite change, chills,  fatigue, fever and unexpected weight change.  HENT:   Negative for hearing loss and voice change.   Eyes:  Negative for eye problems and icterus.  Respiratory:  Negative for chest tightness, cough and shortness of breath.   Cardiovascular:  Negative for chest pain and leg swelling.  Gastrointestinal:  Negative for abdominal distention and abdominal pain.  Endocrine: Negative for hot flashes.  Genitourinary:  Negative for difficulty urinating, dysuria and frequency.   Musculoskeletal:  Negative for arthralgias.  Skin:  Negative for itching and rash.  Neurological:  Negative for light-headedness and numbness.  Hematological:  Negative for adenopathy. Does not bruise/bleed easily.  Psychiatric/Behavioral:  Negative for confusion.     MEDICAL HISTORY:  Past Medical History:  Diagnosis Date   Abdominal aortic aneurysm without rupture ([place])    Anemia    takes B12 injections monthly   Anogenital (venereal) warts    Anxiety    takes Xanax daily as needed   Arthritis    back    Back pain    synovial cyst   Carpal tunnel syndrome    Celiac artery atherosclerosis    Collagen vascular disease (HCC)    Hx Lupus.   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    Hx of adenomatous colonic polyps    Hyperlipidemia    takes Pravastatin nightly   Hypertension    takes Lisinopril-HCTZ and Metoprolol daily   Hyponatremia    Iliac artery aneurysm (HCC)    Lupus (HCC)    takes Plaquenil and Imran daily   Nocturia    Numbness and tingling    left hip/leg/foot   Peripheral vascular disease (HCC)    Peripheral vascular disease (HCC)    Rotator cuff tendonitis    Urinary frequency     SURGICAL HISTORY: Past Surgical  History:  Procedure Laterality Date   BIOPSY  08/28/2021   Procedure: BIOPSY;  Surgeon: Rush Landmark, Telford Nab., MD;  Location: [facility];  Service: Gastroenterology;;   Delphina Cahill LIFT Bilateral 01/27/2021   Procedure: BLEPHAROPLASTY UPPER EYELID; W/EXCESS SKIN BROW PTOSIS REPAIR AND BLEPHAROPTOSIS REPAIR; RESECT EX BILATERAL;  Surgeon: Karle Starch, MD;  Location: [place];  Service: Ophthalmology;  Laterality: Bilateral;   CARDIAC CATHETERIZATION  2012   COLONOSCOPY     COLONOSCOPY WITH PROPOFOL N/A 09/30/2019   Procedure: COLONOSCOPY WITH PROPOFOL;  Surgeon: Robert Bellow, MD;  Location: [facility];  Service: Endoscopy;  Laterality: N/A;   COLONOSCOPY WITH PROPOFOL N/A 05/05/2021   Procedure: COLONOSCOPY WITH PROPOFOL;  Surgeon: [name], MD;  Location: [facility];  Service: Endoscopy;  Laterality: N/A;   CORONARY ANGIOPLASTY     1 stent   ESOPHAGOGASTRODUODENOSCOPY     ESOPHAGOGASTRODUODENOSCOPY (EGD) WITH PROPOFOL N/A 05/05/2021   Procedure: ESOPHAGOGASTRODUODENOSCOPY (EGD) WITH PROPOFOL;  Surgeon: [name], MD;  Location: [facility];  Service: Endoscopy;  Laterality: N/A;   ESOPHAGOGASTRODUODENOSCOPY (EGD) WITH PROPOFOL N/A 08/28/2021   Procedure: ESOPHAGOGASTRODUODENOSCOPY (EGD) WITH PROPOFOL;  Surgeon: Rush Landmark Telford Nab., MD;  Location: [facility];  Service: Gastroenterology;  Laterality: N/A;   EUS N/A 08/28/2021   Procedure: UPPER ENDOSCOPIC ULTRASOUND (EUS) RADIAL;  Surgeon: Irving Copas., MD;  Location: [facility];  Service: Gastroenterology;  Laterality: N/A;   LUMBAR LAMINECTOMY/DECOMPRESSION MICRODISCECTOMY Left 02/03/2015   Procedure: LUMBAR LAMINECTOMY/DECOMPRESSION MICRODISCECTOMY LEFT LUMBAR FOUR-FIVE WITH REMOVAL OF SYNOVIAL CYST;  Surgeon: Newman Pies, MD;  Location: [facility];  Service: Neurosurgery;  Laterality: Left;   NASAL SEPTOPLASTY W/ TURBINOPLASTY Bilateral 10/23/2019   Procedure: NASAL  SEPTOPLASTY WITH SUBMUCOUS RESECTION OF TURBINATE;  Surgeon: Beverly Gust, MD;  Location: [facility];  Service: ENT;  Laterality: Bilateral;   PELVIC ANGIOGRAPHY N/A 05/02/2018   Procedure: PELVIC ANGIOGRAPHY;  Surgeon: Katha Cabal, MD;  Location: [facility];  Service: Cardiovascular;  Laterality: N/A;   POLYPECTOMY  08/28/2021   Procedure: POLYPECTOMY;  Surgeon: Rush Landmark Telford Nab., MD;  Location: [facility];  Service: Gastroenterology;;   SHOULDER ARTHROSCOPY WITH SUBACROMIAL DECOMPRESSION AND BICEP TENDON REPAIR Right 12/04/2018   Procedure: SHOULDER ARTHROSCOPY WITH DEBRIDEMENT, DECOMPRESSION, BICEP TENODESIS;  Surgeon: Corky Mull, MD;  Location: [facility];  Service: Orthopedics;  Laterality: Right;   SUBMUCOSAL INJECTION  08/28/2021   Procedure: SUBMUCOSAL INJECTION;  Surgeon: Rush Landmark Telford Nab., MD;  Location: [facility];  Service: Gastroenterology;;    SOCIAL HISTORY: Social History   Socioeconomic History   Marital status: Married    Spouse name: Not on file   Number of children: Not on file   Years of education: Not on file   Highest  education level: Not on file  Occupational History   Not on file  Tobacco Use   Smoking status: Former    Packs/day: 1.50    Years: 30.00    Total pack years: 45.00    Types: Cigarettes    Quit date: 03/31/2021    Years since quitting: 1.1   Smokeless tobacco: Never  Vaping Use   Vaping Use: Never used  Substance and Sexual Activity   Alcohol use: Yes    Comment: cocktails about 2 daily   Drug use: No   Sexual activity: Yes  Other Topics Concern   Not on file  Social History Narrative   Not on file   Social Determinants of Health   Financial Resource Strain: Not on file  Food Insecurity: Not on file  Transportation Needs: Not on file  Physical Activity: Not on file  Stress: Not on file  Social Connections: Not on file  Intimate Partner Violence: Not on file    FAMILY HISTORY: Family  History  Problem Relation Age of Onset   Leukemia Mother    Hyperlipidemia Father    Hypertension Father    Diabetes Father    Colon cancer Father    Hearing loss Father    Vascular Disease Father     ALLERGIES:  is allergic to fosamax [alendronate sodium] and percocet [oxycodone-acetaminophen].  MEDICATIONS:  Current Outpatient Medications  Medication Sig Dispense Refill   ALPRAZolam (XANAX) 0.5 MG tablet Take 0.5 mg by mouth 3 (three) times daily as needed for anxiety.      amLODipine (NORVASC) 5 MG tablet Take 5 mg by mouth daily.     aspirin EC 81 MG tablet Take 81 mg by mouth daily.     azaTHIOprine (IMURAN) 50 MG tablet Take 125 mg by mouth daily.      cetirizine (ZYRTEC) 10 MG tablet Take 10 mg by mouth daily.     cyanocobalamin (,VITAMIN B-12,) 1000 MCG/ML injection Inject 1,000 mcg into the muscle every 14 (fourteen) days.      ELDERBERRY PO Take 2 tablets by mouth daily.     finasteride (PROSCAR) 5 MG tablet Take 5 mg by mouth daily.     hydrochlorothiazide (HYDRODIURIL) 12.5 MG tablet Take 12.5 mg by mouth daily.     hydroxychloroquine (PLAQUENIL) 200 MG tablet Take 200 mg by mouth 2 (two) times daily.     ibuprofen (ADVIL,MOTRIN) 200 MG tablet Take 400 mg by mouth every 8 (eight) hours as needed for headache or moderate pain.     lisinopril (ZESTRIL) 40 MG tablet Take 40 mg by mouth daily.     metoprolol succinate (TOPROL-XL) 100 MG 24 hr tablet Take 100 mg by mouth at bedtime. Take with or immediately following a meal.     nicotine (NICODERM CQ - DOSED IN MG/24 HOURS) 21 mg/24hr patch Place 21 mg onto the skin daily.     nitroGLYCERIN (NITROLINGUAL) 0.4 MG/SPRAY spray Place 1 spray under the tongue every 5 (five) minutes x 3 doses as needed for chest pain.     omeprazole (PRILOSEC) 20 MG capsule Take 20 mg by mouth at bedtime.     pravastatin (PRAVACHOL) 40 MG tablet Take 40 mg by mouth at bedtime.     Probiotic Product (PROBIOTIC DAILY PO) Take 1 capsule by mouth daily.      spironolactone (ALDACTONE) 25 MG tablet Take 25 mg by mouth daily.     tadalafil (CIALIS) 10 MG tablet Take 1-2 tablets (10-20 mg total) by  mouth daily as needed for erectile dysfunction. 30 tablet 11   tamsulosin (FLOMAX) 0.4 MG CAPS capsule Take 1 capsule (0.4 mg total) by mouth daily after breakfast. 30 capsule 11   triamcinolone (NASACORT) 55 MCG/ACT AERO nasal inhaler Place 1 spray into the nose daily as needed (allergies).     No current facility-administered medications for this visit.     PHYSICAL EXAMINATION: ECOG PERFORMANCE STATUS: 0 - Asymptomatic Vitals:   06/11/22 1320  BP: (!) 143/87  Pulse: 85  Temp: 98.1 F (36.7 C)   Filed Weights   06/11/22 1320  Weight: 196 lb (88.9 kg)    Physical Exam Constitutional:      General: He is not in acute distress. HENT:     Head: Normocephalic and atraumatic.  Eyes:     General: No scleral icterus. Cardiovascular:     Rate and Rhythm: Normal rate and regular rhythm.     Heart sounds: Normal heart sounds.  Pulmonary:     Effort: Pulmonary effort is normal. No respiratory distress.     Breath sounds: No wheezing.  Abdominal:     General: Bowel sounds are normal. There is no distension.     Palpations: Abdomen is soft.  Musculoskeletal:        General: No deformity. Normal range of motion.     Cervical back: Normal range of motion and neck supple.  Skin:    General: Skin is warm and dry.     Findings: No erythema or rash.  Neurological:     Mental Status: He is alert and oriented to person, place, and time. Mental status is at baseline.     Cranial Nerves: No cranial nerve deficit.     Coordination: Coordination normal.  Psychiatric:        Mood and Affect: Mood normal.      LABORATORY DATA:  I have reviewed the data as listed Lab Results  Component Value Date   WBC 5.0 06/04/2022   HGB 15.3 06/04/2022   HCT 43.8 06/04/2022   MCV 96.1 06/04/2022   PLT 226 06/04/2022   Recent Labs    08/28/21 1321  11/28/21 1340 06/04/22 1355  NA 134* 136 132*  K 4.4 4.1 3.6  CL 102 100 105  CO2  --  29 23  GLUCOSE 92 89 110*  BUN 19 12 19   CREATININE 0.80 0.94 0.79  CALCIUM  --  9.0 8.4*  GFRNONAA  --  >60 >60  PROT  --  7.5 7.3  ALBUMIN  --  4.3 4.2  AST  --  37 32  ALT  --  39 34  ALKPHOS  --  63 64  BILITOT  --  0.7 0.9    Iron/TIBC/Ferritin/ %Sat No results found for: "IRON", "TIBC", "FERRITIN", "IRONPCTSAT"    RADIOGRAPHIC STUDIES: I have personally reviewed the radiological images as listed and agreed with the findings in the report. No results found.    ASSESSMENT & PLAN:  1. Neuroendocrine carcinoma of stomach ([place])   2. Family history of cancer   3. MGUS (monoclonal gammopathy of unknown significance)   4. Hyponatremia    #Gastric neuroendocrine carcinoma-well differentiated, Grade 2. Patient is on chronic PPI with initial high gastrin level.  Multiple gastric polyps.   Based on my previous discussion with pathology Dr. Dicie Beam, there is no typical findings of  chronic atrophic gastritis to suggest a typical Type I.The complete normalization of hypergastrinemia after holding PPI is not typical for type II  hypergastrinemia secondary to gastrinoma.Type III is a possibility and hard to exclude.A fourth type of gastric NET has been recently described as arising in the setting of chronic PPI induced hypergastrinemia. Tumors arising in the setting of chronic PPI use appear to behave in a less malignant fashion than true-sporadic tumors. Recommend patient to continue EGD surveillance. Labs reviewed and discussed with patient.  Chronically elevated chromogranin A, lower than previous levels.  Likely due to holding PPI.  However patient has severe GERD symptoms, he is only able to hold PPI for short period of time.  I recommend Tums as needed.   #IgG MGUS, M protein is stable. Continue observation.  Check SPEP and light chain ratio now and every 6 months.   Marland Kitchen#Chronic hyponatremia,  probably due to chronic alcohol use.  Family history of colon cancer.  Refer to Dietitian.  He agrees..  Orders Placed This Encounter  Procedures   CBC with Differential/Platelet    Standing Status:   Future    Standing Expiration Date:   06/12/2023   Comprehensive metabolic panel    Standing Status:   Future    Standing Expiration Date:   06/11/2023   Kappa/lambda light chains    Standing Status:   Future    Standing Expiration Date:   06/11/2023   Multiple Myeloma Panel (SPEP&IFE w/QIG)    Standing Status:   Future    Standing Expiration Date:   06/11/2023   Vitamin B12    Standing Status:   Future    Standing Expiration Date:   06/12/2023   Chromogranin A    Standing Status:   Future    Standing Expiration Date:   06/12/2023   5 HIAA, quantitative, urine, 24 hour    Standing Status:   Future    Standing Expiration Date:   06/11/2023   Ambulatory referral to Genetics    Referral Priority:   Routine    Referral Type:   Consultation    Referral Reason:   Specialty Services Required    Number of Visits Requested:   1    All questions were answered. The patient knows to call the clinic with any problems questions or concerns.  cc Baxter Hire, MD    Return of visit: 6 months.    Earlie Server, MD, PhD  06/11/2022

## 2022-07-04 ENCOUNTER — Encounter: Payer: Self-pay | Admitting: Licensed Clinical Social Worker

## 2022-07-04 ENCOUNTER — Inpatient Hospital Stay: Payer: Medicare Other | Attending: Oncology | Admitting: Licensed Clinical Social Worker

## 2022-07-04 ENCOUNTER — Inpatient Hospital Stay: Payer: Medicare Other

## 2022-07-04 DIAGNOSIS — Z86010 Personal history of colonic polyps: Secondary | ICD-10-CM | POA: Diagnosis not present

## 2022-07-04 DIAGNOSIS — Z800 Family history of malignant neoplasm of digestive organs: Secondary | ICD-10-CM

## 2022-07-04 DIAGNOSIS — C7A8 Other malignant neuroendocrine tumors: Secondary | ICD-10-CM | POA: Diagnosis not present

## 2022-07-04 NOTE — Progress Notes (Signed)
REFERRING PROVIDER: Earlie Server, MD [place],  [place] [address]  PRIMARY PROVIDER:  Baxter Hire, MD  PRIMARY REASON FOR VISIT:  1. 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 Hx of adenomatous colonic polyps   3. Family history of colon cancer      HISTORY OF PRESENT ILLNESS:   Mr. Claiborne, a 71 y.o. male, was seen for a [place] cancer genetics consultation at the request of Dr. Tasia Catchings due to a personal and family history of cancer.  Mr. Febles presents to clinic today to discuss the possibility of a hereditary predisposition to cancer, genetic testing, and to further clarify his future cancer risks, as well as potential cancer risks for family members.    CANCER HISTORY:  In 2023, at the age of 56, Mr. Catalfamo was diagnosed with neuroendocrine carcinoma of the stomach. The treatment plan includes surveillance.   He reports normal prostate screening with PSA.   He reports having colonoscopies approximately every 3 years beginning in his 74s. His first colonoscopy, he reports had 8 polyps. In chart, 2022 colonoscopy showed 6 polyps and 2015 colonoscopy showed 3 polyps, for a cumulative total of at least 17 polyps.  Past Medical History:  Diagnosis Date   Abdominal aortic aneurysm without rupture (HCC)    Anemia    takes B12 injections monthly   Anogenital (venereal) warts    Anxiety    takes Xanax daily as needed   Arthritis    back    Back pain    synovial cyst   Carpal tunnel syndrome    Celiac artery atherosclerosis    Collagen vascular disease (HCC)    Hx Lupus.   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    Hx of adenomatous colonic polyps    Hyperlipidemia    takes  Pravastatin nightly   Hypertension    takes Lisinopril-HCTZ and Metoprolol daily   Hyponatremia    Iliac artery aneurysm (HCC)    Lupus (HCC)    takes Plaquenil and Imran daily   Nocturia    Numbness and tingling    left hip/leg/foot   Peripheral vascular disease (HCC)    Peripheral vascular disease (HCC)    Rotator cuff tendonitis    Urinary frequency     Past Surgical History:  Procedure Laterality Date   BIOPSY  08/28/2021   Procedure: BIOPSY;  Surgeon: Irving Copas., MD;  Location: [facility];  Service: Gastroenterology;;   Delphina Cahill LIFT Bilateral 01/27/2021   Procedure: BLEPHAROPLASTY UPPER EYELID; W/EXCESS SKIN BROW PTOSIS REPAIR AND BLEPHAROPTOSIS REPAIR; RESECT EX BILATERAL;  Surgeon: Karle Starch, MD;  Location: [facility];  Service: Ophthalmology;  Laterality: Bilateral;   CARDIAC CATHETERIZATION  2012   COLONOSCOPY     COLONOSCOPY WITH PROPOFOL N/A 09/30/2019   Procedure: COLONOSCOPY WITH PROPOFOL;  Surgeon: Robert Bellow, MD;  Location: [facility];  Service: Endoscopy;  Laterality: N/A;   COLONOSCOPY WITH PROPOFOL N/A 05/05/2021   Procedure: COLONOSCOPY WITH PROPOFOL;  Surgeon: [name], MD;  Location: [facility];  Service: Endoscopy;  Laterality: N/A;   CORONARY ANGIOPLASTY     1 stent  ESOPHAGOGASTRODUODENOSCOPY     ESOPHAGOGASTRODUODENOSCOPY (EGD) WITH PROPOFOL N/A 05/05/2021   Procedure: ESOPHAGOGASTRODUODENOSCOPY (EGD) WITH PROPOFOL;  Surgeon: [name], MD;  Location: [facility];  Service: Endoscopy;  Laterality: N/A;   ESOPHAGOGASTRODUODENOSCOPY (EGD) WITH PROPOFOL N/A 08/28/2021   Procedure: ESOPHAGOGASTRODUODENOSCOPY (EGD) WITH PROPOFOL;  Surgeon: Rush Landmark Telford Nab., MD;  Location: [facility];  Service: Gastroenterology;  Laterality: N/A;   EUS N/A 08/28/2021   Procedure: UPPER ENDOSCOPIC ULTRASOUND (EUS) RADIAL;  Surgeon: Irving Copas., MD;  Location: [facility];  Service:  Gastroenterology;  Laterality: N/A;   LUMBAR LAMINECTOMY/DECOMPRESSION MICRODISCECTOMY Left 02/03/2015   Procedure: LUMBAR LAMINECTOMY/DECOMPRESSION MICRODISCECTOMY LEFT LUMBAR FOUR-FIVE WITH REMOVAL OF SYNOVIAL CYST;  Surgeon: Newman Pies, MD;  Location: [facility];  Service: Neurosurgery;  Laterality: Left;   NASAL SEPTOPLASTY W/ TURBINOPLASTY Bilateral 10/23/2019   Procedure: NASAL SEPTOPLASTY WITH SUBMUCOUS RESECTION OF TURBINATE;  Surgeon: Beverly Gust, MD;  Location: [facility];  Service: ENT;  Laterality: Bilateral;   PELVIC ANGIOGRAPHY N/A 05/02/2018   Procedure: PELVIC ANGIOGRAPHY;  Surgeon: Katha Cabal, MD;  Location: [facility];  Service: Cardiovascular;  Laterality: N/A;   POLYPECTOMY  08/28/2021   Procedure: POLYPECTOMY;  Surgeon: Rush Landmark Telford Nab., MD;  Location: [facility];  Service: Gastroenterology;;   SHOULDER ARTHROSCOPY WITH SUBACROMIAL DECOMPRESSION AND BICEP TENDON REPAIR Right 12/04/2018   Procedure: SHOULDER ARTHROSCOPY WITH DEBRIDEMENT, DECOMPRESSION, BICEP TENODESIS;  Surgeon: Corky Mull, MD;  Location: [facility];  Service: Orthopedics;  Laterality: Right;   SUBMUCOSAL INJECTION  08/28/2021   Procedure: SUBMUCOSAL INJECTION;  Surgeon: Rush Landmark Telford Nab., MD;  Location: [facility];  Service: Gastroenterology;;    FAMILY HISTORY:  We obtained a detailed, 4-generation family history.  Significant diagnoses are listed below: Family History  Problem Relation Age of Onset   Leukemia Mother        dx 55s   Hyperlipidemia Father    Hypertension Father    Diabetes Father    Colon cancer Father        dx 23s   Hearing loss Father    Vascular Disease Father    Lung cancer Brother        d. 38   Mr. Petron has 2 daughters, 95 and 60. He has 3 sisters, 1 brother. His brother passed of lung cancer at 6.  Mr. Eick mother had leukemia in his 37s, she passed at 53. No other known cancers on this side of the  family.  Mr. Kaneshiro father had colon cancer in his 28s and passed at 26. No other known cancers on this side of the family.  Mr. Buys is unaware of previous family history of genetic testing for hereditary cancer risks. There is no reported Ashkenazi Jewish ancestry. There is no known consanguinity.    GENETIC COUNSELING ASSESSMENT: Mr. Ramson is a 71 y.o. male with a personal history of colon polyps and family history of cancer which is somewhat suggestive of a hereditary cancer syndrome and predisposition to cancer. We, therefore, discussed and recommended the following at today's visit.   DISCUSSION: We discussed that polyps in general are common, however, most people have fewer than 5 lifetime polyps.  When an individual has 10 or more polyps we become concerned about an underlying polyposis syndrome.  The most common hereditary polyposis syndromes are caused by problems in the APC and MUTYH genes.We also discussed that approximately 10% of cancer is hereditary. Cancers and risks are gene specific. We discussed that testing is beneficial for several  reasons including knowing about cancer risks, identifying potential screening and risk-reduction options that may be appropriate, and to understand if other family members could be at risk for cancer and allow them to undergo genetic testing.   We reviewed the characteristics, features and inheritance patterns of hereditary cancer syndromes. We also discussed genetic testing, including the appropriate family members to test, the process of testing, insurance coverage and turn-around-time for results. We discussed the implications of a negative, positive and/or variant of uncertain significant result. We recommended Mr. Fyock pursue genetic testing for the [facility] Multi-Cancer+RNA gene panel.   Based on Mr. Koval personal history of colon polyps, he meets medical criteria for genetic testing. Despite that he meets criteria, he may still have an  out of pocket cost. We discussed that if his out of pocket cost for testing is over $100, the laboratory will call and confirm whether he wants to proceed with testing.  If the out of pocket cost of testing is less than $100 he will be billed by the genetic testing laboratory.   PLAN: After considering the risks, benefits, and limitations, Mr. Holt provided informed consent to pursue genetic testing and the blood sample was sent to [facility] for analysis of the Multi-Cancer+RNA panel. Results should be available within approximately 2-3 weeks' time, at which point they will be disclosed by telephone to Mr. Stainback, as will any additional recommendations warranted by these results. Mr. Peixoto will receive a summary of his genetic counseling visit and a copy of his results once available. This information will also be available in Epic.   Mr. Carsten questions were answered to his satisfaction today. Our contact information was provided should additional questions or concerns arise. Thank you for the referral and allowing [place] to share in the care of your patient.   Faith Rogue, MS, [facility] Genetic Counselor [email or web address] Phone: [phone]  The patient was seen for a total of 40 minutes in face-to-face genetic counseling.  Dr. Grayland Ormond was available for discussion regarding this case.   _______________________________________________________________________ For Office Staff:  Number of people involved in session: 2 Was an Intern/ student involved with case: yes; [employer] intern Blenda Nicely was present and assisted.

## 2022-07-18 ENCOUNTER — Other Ambulatory Visit: Payer: Self-pay | Admitting: Internal Medicine

## 2022-07-18 ENCOUNTER — Other Ambulatory Visit: Payer: Self-pay | Admitting: Neurology

## 2022-07-18 ENCOUNTER — Other Ambulatory Visit: Payer: Self-pay | Admitting: Sports Medicine"

## 2022-07-18 ENCOUNTER — Other Ambulatory Visit: Payer: Self-pay | Admitting: Pediatrics

## 2022-07-18 DIAGNOSIS — Z87891 Personal history of nicotine dependence: Secondary | ICD-10-CM

## 2022-07-18 DIAGNOSIS — Z122 Encounter for screening for malignant neoplasm of respiratory organs: Secondary | ICD-10-CM

## 2022-07-25 ENCOUNTER — Telehealth: Payer: Self-pay | Admitting: Licensed Clinical Social Worker

## 2022-07-25 NOTE — Telephone Encounter (Signed)
I contacted Mr. Swayne to discuss his genetic testing results. 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 Detailed clinic note to follow.   The test report has been scanned into EPIC and is located under the Molecular Pathology section of the Results Review tab.  A portion of the result report is included below for reference.      Faith Rogue, MS, [facility] Genetic Counselor [email or web address] Phone: [phone]

## 2022-07-26 ENCOUNTER — Encounter: Payer: Self-pay | Admitting: Licensed Clinical Social Worker

## 2022-07-26 ENCOUNTER — Ambulatory Visit: Payer: Self-pay | Admitting: Licensed Clinical Social Worker

## 2022-07-26 DIAGNOSIS — Z1379 Encounter for other screening for genetic and chromosomal anomalies: Secondary | ICD-10-CM | POA: Insufficient documentation

## 2022-07-26 DIAGNOSIS — Z1509 Genetic susceptibility to other malignant neoplasm: Secondary | ICD-10-CM

## 2022-07-26 NOTE — Progress Notes (Signed)
MITF Family Letter

## 2022-07-26 NOTE — Progress Notes (Signed)
HPI:   [name] was previously seen in the [place] clinic due to a personal and family history of cancer and concerns regarding a hereditary predisposition to cancer. Please refer to our prior cancer genetics clinic note for more information regarding our discussion, assessment and recommendations, at the time. [name] recent genetic test results were disclosed to him, as were recommendations warranted by these results. These results and recommendations are discussed in more detail below.  CANCER HISTORY:  Oncology History   No history exists.    FAMILY HISTORY:  We obtained a detailed, 4-generation family history.  Significant diagnoses are listed below: Family History  Problem Relation Age of Onset   Leukemia Mother        dx 32s   Hyperlipidemia Father    Hypertension Father    Diabetes Father    Colon cancer Father        dx 37s   Hearing loss Father    Vascular Disease Father    Lung cancer Brother        d. 44    [name] has 2 daughters, 32 and 48. He has 3 sisters, 1 brother. His brother passed of lung cancer at 79.   [name] mother had leukemia in his 18s, she passed at 56. No other known cancers on this side of the family.   [name] father had colon cancer in his 40s and passed at 48. No other known cancers on this side of the family.   [name] is unaware of previous family history of genetic testing for hereditary cancer risks. There is no reported Ashkenazi Jewish ancestry. There is no known consanguinity.     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 The remainder of testing was negative/normal.  The Multi-Cancer Panel + RNA offered by [facility] includes sequencing and/or deletion duplication testing of the following 84 genes: AIP, ALK, APC, ATM, AXIN2,BAP1,  BARD1, BLM, BMPR1A, BRCA1, BRCA2, BRIP1, CASR, CDC73, CDH1, CDK4, CDKN1B, CDKN1C, CDKN2A  (p14ARF), CDKN2A (p16INK4a), CEBPA, CHEK2, CTNNA1, DICER1, DIS3L2, EGFR (c.2369C>T, p.Thr790Met variant only), EPCAM (Deletion/duplication testing only), FH, FLCN, GATA2, GPC3, GREM1 (Promoter region deletion/duplication testing only), HOXB13 (c.251G>A, p.Gly84Glu), HRAS, KIT, MAX, MEN1, MET, MITF (c.952G>A, p.Glu318Lys variant only), MLH1, MSH2, MSH3, MSH6, MUTYH, NBN, NF1, NF2, NTHL1, PALB2, PDGFRA, PHOX2B, PMS2, POLD1, POLE, POT1, PRKAR1A, PTCH1, PTEN, RAD50, RAD51C, RAD51D, RB1, RECQL4, RET, RUNX1, SDHAF2, SDHA (sequence changes only), SDHB, SDHC, SDHD, SMAD4, SMARCA4, SMARCB1, SMARCE1, STK11, SUFU, TERC, TERT, TMEM127, TP53, TSC1, TSC2, VHL, WRN and WT1.   The test report has been scanned into EPIC and is located under the Molecular Pathology section of the Results Review tab.  A portion of the result report is included below for reference. Genetic testing reported out on 07/24/2022.     Genetic testing identified a variant of uncertain significance (VUS) in the RAD50 gene called c.1911T>A.  At this time, it is unknown if this variant is associated with an increased risk for cancer or if it is benign, but most uncertain variants are reclassified to benign. It should not be used to make medical management decisions. With time, we suspect the laboratory will determine the significance of this variant, if any. If the laboratory reclassifies this variant, we will attempt to contact [name] to discuss it further.   MITF Cancer Risks: 2-8 fold increased risk for melanoma   Clinical Information: Malignant melanoma is a neoplasm, or  cancer, of melanocytes, the cells that produce pigment. Melanoma most often occurs in the skin, but may also affect the eyes, ears, gastrointestinal tract, and oral and genital membranes. Cutaneous melanoma is considered the most lethal skin cancer if not detected and treated during its early stages (PMID: 02409735). Approximately 5-10% of cases are familial (PMID: 32992426).  The c.952G>A (p.Glu318Lys) variant in MITF, also known as E318K, is associated with an increased risk of melanoma (PMID: 83419622, 29798921, 19417408, 14481856, 31497026, 37858850, 27741287). The risks are not yet established; however, studies suggest the risk may be 2- to 8-fold higher than the general population risk (PMID: 86767209, 47096283). This variant has been associated with features including high nevi count (>200), fair skin, non-blue eye color, and early-onset melanoma (under age 84) (PMID: 66294765, 46503546, 56812751, 70017494). Additionally, there is evidence to suggest this variant may predispose to fast-growing melanomas (PMID: 49675916).  Management: While there remains lack of a clear consensus on dermatologic management and surveillance guidelines for individuals at increased risk for melanoma, heightened screening may result in early detection and removal of cutaneous lesions at premalignant or early stages, associated with a more favorable prognosis (PMID: 38466599).  There are currently no published medical management guidelines for individuals at increased cancer risk due to a MITF mutation. We expect that medical management guidelines for the MITF gene may become available over time, thus we encourage [name] to contact us regularly for any updates. While there are no established screening or surveillance guidelines for individuals with the pathogenic E318K variant in MITF, the following recommendations have been suggested (PMID: 35701779, 39030092):   Skin Cancer Screening and Risk Reduction: Regular skin self-examinations Individuals should notify their physicians of any changes to moles such as increasing in size, darkening in color, or other change in appearance. Regular skin examinations by a dermatologist  Follow sun-safety recommendations such as: Using UVA and UVB 30 SPF or higher sunscreen Avoiding sunburns Wearing protective clothing and sunglasses Avoid using  tanning beds For more information about the prevention of melanoma visit [email or web address]     This result does not explain [name] personal/family history of cancer, or his personal history of colon polyps.  This test simply tells [place] that we cannot yet define why [name] has had an increased number of colorectal polyps. [name] medical management and screening should be based on the prospect that he will likely form more colon polyps and should, therefore, undergo more frequent colonoscopy screening at intervals determined by his GI providers.  We also recommended that [name] have an upper endoscopy periodically.  Family Members: Hereditary predisposition to cancer due to pathogenic variants in the MITF gene has autosomal dominant inheritance. This means that an individual with a pathogenic variant has a 50% chance of passing the condition on to his/her offspring. Identification of a pathogenic variant allows for the recognition of at-risk relatives who can pursue testing for the familial variant.   Family members may consider genetic testing for this familial pathogenic variant. As there are generally no childhood cancer risks associated with pathogenic variants in the MITF gene, individuals in the family are not recommended to have testing until they reach at least 69 years of age. They may contact our office at [phone] for more information or to schedule an appointment. Family members who live outside of the area are encouraged to find a genetic counselor in their area by visiting: [email or web address].  FOLLOW-UP:  Lastly, we discussed with [name] that cancer genetics is a  rapidly advancing field and it is possible that new genetic tests will be appropriate for him and/or his family members in the future. We encouraged him to remain in contact with cancer genetics on an annual basis so we can update his personal and family histories and let him  know of advances in cancer genetics that may benefit this family.   Our contact number was provided. [name] questions were answered to his satisfaction, and he knows he is welcome to call us at anytime with additional questions or concerns.    Faith Rogue, MS, [facility] Genetic Counselor [email or web address] Phone: [phone]

## 2022-07-26 NOTE — Progress Notes (Signed)
MITF patient note

## 2022-07-27 ENCOUNTER — Ambulatory Visit
Admission: RE | Admit: 2022-07-27 | Discharge: 2022-07-27 | Disposition: A | Discharge status: Home / Self Care | Payer: Medicare Other | Source: Ambulatory Visit | Attending: Internal Medicine | Admitting: Internal Medicine

## 2022-07-27 DIAGNOSIS — Z122 Encounter for screening for malignant neoplasm of respiratory organs: Secondary | ICD-10-CM

## 2022-07-27 DIAGNOSIS — Z87891 Personal history of nicotine dependence: Secondary | ICD-10-CM

## 2022-08-13 ENCOUNTER — Ambulatory Visit (LOCAL_COMMUNITY_HEALTH_CENTER): Payer: Medicare Other

## 2022-08-13 DIAGNOSIS — Z23 Encounter for immunization: Secondary | ICD-10-CM

## 2022-08-13 DIAGNOSIS — Z719 Counseling, unspecified: Secondary | ICD-10-CM

## 2022-08-13 NOTE — Progress Notes (Signed)
  Are you feeling sick today? No   Have you ever received a dose of COVID-19 Vaccine? [employer], [place], [place], [place], Other) No  If yes, which vaccine and how many doses?   6   Did you bring the vaccination record card or other documentation?  No   Do you have a health condition or are undergoing treatment that makes you moderately or severely immunocompromised? This would include, but not be limited to: cancer, HIV, organ transplant, immunosuppressive therapy/high-dose corticosteroids, or moderate/severe primary immunodeficiency.  Yes - Lupus  Have you received COVID-19 vaccine before or during hematopoietic cell transplant (HCT) or CAR-T-cell therapies? No  Have you ever had an allergic reaction to: (This would include a severe allergic reaction or a reaction that caused hives, swelling, or respiratory distress, including wheezing.) A component of a COVID-19 vaccine or a previous dose of COVID-19 vaccine? No   Have you ever had an allergic reaction to another vaccine (other thanCOVID-19 vaccine) or an injectable medication? (This would include a severe allergic reaction or a reaction that caused hives, swelling, or respiratory distress, including wheezing.)   No    Do you have a history of any of the following:  Myocarditis or Pericarditis No  Dermal fillers:  No  Multisystem Inflammatory Syndrome (MIS-C or MIS-A)? No  COVID-19 disease within the past 3 months? No  Vaccinated with monkeypox vaccine in the last 4 weeks? No  Comirnaty given in right deltoid, Tdap and Fluzone HD given in left deltoid. Tolerated well. COVID card updated and [facility] updated.  Copy provided. VIS provided.

## 2022-08-16 ENCOUNTER — Encounter: Payer: Self-pay | Admitting: *Deleted

## 2022-08-17 ENCOUNTER — Ambulatory Visit: Payer: Medicare Other | Admitting: Anesthesiology

## 2022-08-17 ENCOUNTER — Encounter: Payer: Self-pay | Admitting: *Deleted

## 2022-08-17 ENCOUNTER — Ambulatory Visit
Admission: RE | Admit: 2022-08-17 | Discharge: 2022-08-17 | Disposition: A | Discharge status: Home / Self Care | Payer: Medicare Other | Attending: Gastroenterology | Admitting: Gastroenterology

## 2022-08-17 ENCOUNTER — Encounter
Admission: RE | Disposition: A | Discharge status: Home / Self Care | Payer: Self-pay | Source: Home / Self Care | Attending: Gastroenterology

## 2022-08-17 DIAGNOSIS — I739 Peripheral vascular disease, unspecified: Secondary | ICD-10-CM | POA: Diagnosis not present

## 2022-08-17 DIAGNOSIS — F419 Anxiety disorder, unspecified: Secondary | ICD-10-CM | POA: Insufficient documentation

## 2022-08-17 DIAGNOSIS — I10 Essential (primary) hypertension: Secondary | ICD-10-CM | POA: Insufficient documentation

## 2022-08-17 DIAGNOSIS — Z79899 Other long term (current) drug therapy: Secondary | ICD-10-CM | POA: Diagnosis not present

## 2022-08-17 DIAGNOSIS — K219 Gastro-esophageal reflux disease without esophagitis: Secondary | ICD-10-CM | POA: Insufficient documentation

## 2022-08-17 DIAGNOSIS — K449 Diaphragmatic hernia without obstruction or gangrene: Secondary | ICD-10-CM | POA: Diagnosis not present

## 2022-08-17 DIAGNOSIS — E785 Hyperlipidemia, unspecified: Secondary | ICD-10-CM | POA: Diagnosis not present

## 2022-08-17 DIAGNOSIS — I2510 Atherosclerotic heart disease of native coronary artery without angina pectoris: Secondary | ICD-10-CM | POA: Diagnosis not present

## 2022-08-17 DIAGNOSIS — Z87891 Personal history of nicotine dependence: Secondary | ICD-10-CM | POA: Insufficient documentation

## 2022-08-17 DIAGNOSIS — Z08 Encounter for follow-up examination after completed treatment for malignant neoplasm: Secondary | ICD-10-CM | POA: Insufficient documentation

## 2022-08-17 DIAGNOSIS — Z85828 Personal history of other malignant neoplasm of skin: Secondary | ICD-10-CM | POA: Insufficient documentation

## 2022-08-17 DIAGNOSIS — Z85028 Personal history of other malignant neoplasm of stomach: Secondary | ICD-10-CM | POA: Diagnosis not present

## 2022-08-17 HISTORY — PX: ESOPHAGOGASTRODUODENOSCOPY (EGD) WITH PROPOFOL: SHX5813

## 2022-08-17 SURGERY — ESOPHAGOGASTRODUODENOSCOPY (EGD) WITH PROPOFOL
Anesthesia: Monitor Anesthesia Care

## 2022-08-17 MED ORDER — EPHEDRINE 5 MG/ML INJ
INTRAVENOUS | Status: AC
  Filled 2022-08-17: qty 5

## 2022-08-17 MED ORDER — PROPOFOL 10 MG/ML IV BOLUS
INTRAVENOUS | Status: AC
  Filled 2022-08-17: qty 20

## 2022-08-17 MED ORDER — PROPOFOL 1000 MG/100ML IV EMUL
INTRAVENOUS | Status: AC
  Filled 2022-08-17: qty 100

## 2022-08-17 MED ORDER — LIDOCAINE HCL (CARDIAC) PF 100 MG/5ML IV SOSY
PREFILLED_SYRINGE | INTRAVENOUS | Status: DC | PRN
  Administered 2022-08-17: 40 mg via INTRAVENOUS

## 2022-08-17 MED ORDER — PROPOFOL 10 MG/ML IV BOLUS
INTRAVENOUS | Status: DC | PRN
  Administered 2022-08-17: 50 mg via INTRAVENOUS
  Administered 2022-08-17: 30 mg via INTRAVENOUS

## 2022-08-17 MED ORDER — PHENYLEPHRINE 80 MCG/ML (10ML) SYRINGE FOR IV PUSH (FOR BLOOD PRESSURE SUPPORT)
PREFILLED_SYRINGE | INTRAVENOUS | Status: AC
  Filled 2022-08-17: qty 10

## 2022-08-17 MED ORDER — STERILE WATER FOR IRRIGATION IR SOLN
Status: DC | PRN
  Administered 2022-08-17: 60 mL

## 2022-08-17 MED ORDER — SODIUM CHLORIDE 0.9 % IV SOLN
INTRAVENOUS | Status: DC
  Administered 2022-08-17: 1000 mL via INTRAVENOUS

## 2022-08-17 MED ORDER — PROPOFOL 500 MG/50ML IV EMUL
INTRAVENOUS | Status: DC | PRN
  Administered 2022-08-17: 125 ug/kg/min via INTRAVENOUS

## 2022-08-17 NOTE — H&P (Signed)
Outpatient short stay form Pre-procedure 08/17/2022  [name], MD  Primary Physician: Baxter Hire, MD  Reason for visit:  History of gastric NET  History of present illness:    71 y/o gentleman with history of hypertension, connective tissue disorder, and history of skin cancer here for EGD for history of NET. No blood thinners, no significant abdominal or neck surgeries.    Current Facility-Administered Medications:    0.9 %  sodium chloride infusion, , Intravenous, Continuous, Shervon Kerwin, Hilton Cork, MD, Last Rate: 20 mL/hr at 08/17/22 0726, 1,000 mL at 08/17/22 0726  Medications Prior to Admission  Medication Sig Dispense Refill Last Dose   ALPRAZolam (XANAX) 0.5 MG tablet Take 0.5 mg by mouth 3 (three) times daily as needed for anxiety.    08/16/2022   amLODipine (NORVASC) 5 MG tablet Take 5 mg by mouth daily.   08/16/2022   aspirin EC 81 MG tablet Take 81 mg by mouth daily.   08/16/2022   azaTHIOprine (IMURAN) 50 MG tablet Take 125 mg by mouth daily.    08/16/2022   cetirizine (ZYRTEC) 10 MG tablet Take 10 mg by mouth daily.   08/16/2022   cyanocobalamin (,VITAMIN B-12,) 1000 MCG/ML injection Inject 1,000 mcg into the muscle every 14 (fourteen) days.    08/16/2022   ELDERBERRY PO Take 2 tablets by mouth daily.   08/16/2022   finasteride (PROSCAR) 5 MG tablet Take 5 mg by mouth daily.   08/16/2022   hydrochlorothiazide (HYDRODIURIL) 12.5 MG tablet Take 12.5 mg by mouth daily.   08/16/2022   hydroxychloroquine (PLAQUENIL) 200 MG tablet Take 200 mg by mouth 2 (two) times daily.   08/16/2022   lisinopril (ZESTRIL) 40 MG tablet Take 40 mg by mouth daily.   08/16/2022   metoprolol succinate (TOPROL-XL) 100 MG 24 hr tablet Take 100 mg by mouth at bedtime. Take with or immediately following a meal.   08/16/2022   nicotine (NICODERM CQ - DOSED IN MG/24 HOURS) 21 mg/24hr patch Place 21 mg onto the skin daily.   08/16/2022   pravastatin (PRAVACHOL) 40 MG tablet Take 40 mg by mouth at bedtime.    08/16/2022   Probiotic Product (PROBIOTIC DAILY PO) Take 1 capsule by mouth daily.   08/16/2022   spironolactone (ALDACTONE) 25 MG tablet Take 25 mg by mouth daily.   08/16/2022   tamsulosin (FLOMAX) 0.4 MG CAPS capsule Take 1 capsule (0.4 mg total) by mouth daily after breakfast. 30 capsule 11 08/16/2022   triamcinolone (NASACORT) 55 MCG/ACT AERO nasal inhaler Place 1 spray into the nose daily as needed (allergies).   08/16/2022   ibuprofen (ADVIL,MOTRIN) 200 MG tablet Take 400 mg by mouth every 8 (eight) hours as needed for headache or moderate pain.    at prn   nitroGLYCERIN (NITROLINGUAL) 0.4 MG/SPRAY spray Place 1 spray under the tongue every 5 (five) minutes x 3 doses as needed for chest pain.    at prn   omeprazole (PRILOSEC) 20 MG capsule Take 20 mg by mouth at bedtime.      tadalafil (CIALIS) 10 MG tablet Take 1-2 tablets (10-20 mg total) by mouth daily as needed for erectile dysfunction. 30 tablet 11  at prn     Allergies  Allergen Reactions   Fosamax [Alendronate Sodium]     Leg pain   Percocet [Oxycodone-Acetaminophen] Nausea And Vomiting     Past Medical History:  Diagnosis Date   Abdominal aortic aneurysm without rupture (HCC)    Anemia  takes B12 injections monthly   Anogenital (venereal) warts    Anxiety    takes Xanax daily as needed   Arthritis    back    Back pain    synovial cyst   Carpal tunnel syndrome    Celiac artery atherosclerosis    Collagen vascular disease (HCC)    Hx Lupus.   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    Physical Exam  Gen: Alert, oriented. Appears stated age.  HEENT: PERRLA. Lungs: No respiratory distress CV: RRR Abd: soft, benign, no masses Ext: No edema    Planned procedures: Proceed with EGD. The patient understands the nature of the planned procedure, indications, risks, alternatives and potential complications including but not limited to bleeding, infection, perforation, damage to internal organs and possible oversedation/side effects from anesthesia. The patient agrees and gives consent to proceed.  Please refer to procedure notes for findings, recommendations and patient disposition/instructions.     [name], MD [facility]

## 2022-08-17 NOTE — Interval H&P Note (Signed)
History and Physical Interval Note:  08/17/2022 7:45 AM  Sergio Callahan  has presented today for surgery, with the diagnosis of Gastric NET.  The various methods of treatment have been discussed with the patient and family. After consideration of risks, benefits and other options for treatment, the patient has consented to  Procedure(s): ESOPHAGOGASTRODUODENOSCOPY (EGD) WITH PROPOFOL (N/A) as a surgical intervention.  The patient's history has been reviewed, patient examined, no change in status, stable for surgery.  I have reviewed the patient's chart and labs.  Questions were answered to the patient's satisfaction.     [name]  Ok to proceed with EGD

## 2022-08-17 NOTE — Anesthesia Preprocedure Evaluation (Addendum)
Anesthesia Evaluation  Patient identified by MRN, date of birth, ID band Patient awake    Reviewed: Allergy & Precautions, NPO status , reviewed documented beta blocker date and time   Airway Mallampati: III  TM Distance: >3 FB     Dental no notable dental hx.    Pulmonary former smoker,    breath sounds clear to auscultation       Cardiovascular Exercise Tolerance: Good hypertension, Pt. on home beta blockers and Pt. on medications + CAD and + Peripheral Vascular Disease   Rhythm:Regular Rate:Normal     Neuro/Psych Anxiety  Neuromuscular disease    GI/Hepatic Neg liver ROS, GERD  ,  Endo/Other  negative endocrine ROS  Renal/GU negative Renal ROS     Musculoskeletal   Abdominal Normal abdominal exam  (+)   Peds  Hematology   Anesthesia Other Findings   Reproductive/Obstetrics                            Anesthesia Physical  Anesthesia Plan  ASA: 3  Anesthesia Plan: MAC   Post-op Pain Management:    Induction: Intravenous  PONV Risk Score and Plan: Ondansetron and Propofol infusion  Airway Management Planned: Nasal Cannula and Simple Face Mask  Additional Equipment:   Intra-op Plan:   Post-operative Plan:   Informed Consent: I have reviewed the patients History and Physical, chart, labs and discussed the procedure including the risks, benefits and alternatives for the proposed anesthesia with the patient or authorized representative who has indicated his/her understanding and acceptance.     Dental advisory given  Plan Discussed with: CRNA and Anesthesiologist  Anesthesia Plan Comments:         Anesthesia Quick Evaluation

## 2022-08-17 NOTE — Op Note (Signed)
[facility] Patient Name: Sergio Callahan Procedure Date: 08/17/2022 7:07 AM MRN: [id number] Account #: [id number] Date of Birth: [birth year] Admit Type: Outpatient Age: 71 Room: [facility] ENDO ROOM 3 Gender: Male Note Status: Finalized Instrument Name: Upper Endoscope [id number] Procedure:             Upper GI endoscopy Indications:           Follow-up of malignant tumor of the stomach Providers:             Andrey Farmer MD, MD Referring MD:          Baxter Hire, MD (Referring MD) Medicines:             Monitored Anesthesia Care Complications:         No immediate complications. Estimated blood loss:                         Minimal. Procedure:             Pre-Anesthesia Assessment:                        - Prior to the procedure, a History and Physical was                         performed, and patient medications and allergies were                         reviewed. The patient is competent. The risks and                         benefits of the procedure and the sedation options and                         risks were discussed with the patient. All questions                         were answered and informed consent was obtained.                         Patient identification and proposed procedure were                         verified by the physician, the nurse, the                         anesthesiologist, the anesthetist and the technician                         in the endoscopy suite. Mental Status Examination:                         alert and oriented. Airway Examination: normal                         oropharyngeal airway and neck mobility. Respiratory                         Examination: clear to auscultation. CV Examination:  normal. Prophylactic Antibiotics: The patient does not                         require prophylactic antibiotics. Prior                         Anticoagulants: The patient has taken no previous                          anticoagulant or antiplatelet agents. ASA Grade                         Assessment: III - A patient with severe systemic                         disease. After reviewing the risks and benefits, the                         patient was deemed in satisfactory condition to                         undergo the procedure. The anesthesia plan was to use                         monitored anesthesia care (MAC). Immediately prior to                         administration of medications, the patient was                         re-assessed for adequacy to receive sedatives. The                         heart rate, respiratory rate, oxygen saturations,                         blood pressure, adequacy of pulmonary ventilation, and                         response to care were monitored throughout the                         procedure. The physical status of the patient was                         re-assessed after the procedure.                        After obtaining informed consent, the endoscope was                         passed under direct vision. Throughout the procedure,                         the patient's blood pressure, pulse, and oxygen                         saturations were monitored continuously. The Endoscope  was introduced through the mouth, and advanced to the                         second part of duodenum. The upper GI endoscopy was                         accomplished without difficulty. The patient tolerated                         the procedure well. Findings:      A small hiatal hernia was present.      The exam of the esophagus was otherwise normal.      Localized mildly erythematous mucosa was found on the greater curvature       of the gastric body. Biopsies were taken with a cold forceps for       histology. Estimated blood loss was minimal.      A few small sessile fundic gland polyps with no bleeding and no stigmata       of  recent bleeding were found in the gastric fundus.      The examined duodenum was normal. Impression:            - Small hiatal hernia.                        - Erythematous mucosa in the greater curvature of the                         gastric body. Biopsied.                        - A few fundic gland polyps.                        - Normal examined duodenum. Recommendation:        - Discharge patient to home.                        - Resume previous diet.                        - Continue present medications.                        - Await pathology results.                        - Return to referring physician as previously                         scheduled. Procedure Code(s):     --- Professional ---                        [phone], Esophagogastroduodenoscopy, flexible,                         transoral; with biopsy, single or multiple Diagnosis Code(s):     --- Professional ---                        K44.9, Diaphragmatic hernia without obstruction or  gangrene                        K31.89, Other diseases of stomach and duodenum                        K31.7, Polyp of stomach and duodenum                        C16.9, Malignant neoplasm of stomach, unspecified CPT copyright 2019 [employer]. All rights reserved. The codes documented in this report are preliminary and upon coder review may  be revised to meet current compliance requirements. Andrey Farmer MD, MD 08/17/2022 8:24:11 AM Number of Addenda: 0 Note Initiated On: 08/17/2022 7:07 AM Estimated Blood Loss:  Estimated blood loss was minimal.      [facility]

## 2022-08-17 NOTE — Anesthesia Postprocedure Evaluation (Signed)
Anesthesia Post Note  Patient: Sergio Callahan  Procedure(s) Performed: ESOPHAGOGASTRODUODENOSCOPY (EGD) WITH PROPOFOL  Patient location during evaluation: Endoscopy Anesthesia Type: General Level of consciousness: awake and alert Pain management: pain level controlled Vital Signs Assessment: post-procedure vital signs reviewed and stable Respiratory status: spontaneous breathing, nonlabored ventilation and respiratory function stable Cardiovascular status: blood pressure returned to baseline and stable Postop Assessment: no apparent nausea or vomiting Anesthetic complications: no   No notable events documented.   Last Vitals:  Vitals:   08/17/22 0809 08/17/22 0819  BP: (!) 137/90 (!) 140/92  Pulse: 69 70  Resp: 17 20  Temp:    SpO2: 98% 100%    Last Pain:  Vitals:   08/17/22 0819  TempSrc:   PainSc: 0-No pain                 [place] Ouch

## 2022-08-17 NOTE — Transfer of Care (Signed)
Immediate Anesthesia Transfer of Care Note  Patient: Sergio Callahan  Procedure(s) Performed: ESOPHAGOGASTRODUODENOSCOPY (EGD) WITH PROPOFOL  Patient Location: PACU  Anesthesia Type:General  Level of Consciousness: awake and alert   Airway & Oxygen Therapy: Patient Spontanous Breathing  Post-op Assessment: Report given to RN and Post -op Vital signs reviewed and stable  Post vital signs: Reviewed and stable  Last Vitals:  Vitals Value Taken Time  BP 136/91 08/17/22 0759  Temp 36.1 C 08/17/22 0759  Pulse 72 08/17/22 0801  Resp 22 08/17/22 0801  SpO2 98 % 08/17/22 0801  Vitals shown include unvalidated device data.  Last Pain:  Vitals:   08/17/22 0759  TempSrc: Tympanic  PainSc: Asleep         Complications: No notable events documented.

## 2022-08-20 ENCOUNTER — Encounter: Payer: Self-pay | Admitting: Gastroenterology

## 2022-08-20 LAB — SURGICAL PATHOLOGY

## 2022-09-10 ENCOUNTER — Encounter (INDEPENDENT_AMBULATORY_CARE_PROVIDER_SITE_OTHER): Payer: Self-pay

## 2022-12-03 ENCOUNTER — Inpatient Hospital Stay: Payer: Medicare Other | Attending: Oncology

## 2022-12-05 ENCOUNTER — Other Ambulatory Visit: Payer: PRIVATE HEALTH INSURANCE

## 2022-12-10 ENCOUNTER — Inpatient Hospital Stay: Payer: Medicare Other | Admitting: Oncology

## 2022-12-12 ENCOUNTER — Ambulatory Visit: Payer: PRIVATE HEALTH INSURANCE | Admitting: Oncology

## 2022-12-15 NOTE — Progress Notes (Unsigned)
MRN : [id number]  Sergio Callahan is a 72 y.o. ([birth year]) male who presents with chief complaint of check circulation.  History of Present Illness:   The patient returns to the office for followup and review status post angiogram with intervention on 05/02/2018.   Procedure: Percutaneous transluminal and plasty and stent placement left common iliac artery; "kissing balloon" technique   The patient notes improvement in the lower extremity symptoms. No interval shortening of the patient's claudication distance or rest pain symptoms. No new ulcers or wounds have occurred since the last visit.  There have been no significant changes to the patient's overall health care.  No documented history of amaurosis fugax or recent TIA symptoms. There are no recent neurological changes noted. No documented history of DVT, PE or superficial thrombophlebitis. The patient denies recent episodes of angina or shortness of breath.   ABI's Rt=1.10 and Lt=1.07  (previous ABI's Rt=1.19 and Lt=1.13)  Previous duplex US of the aorta iliac arterial system shows the maximal aortic diameter is 2.8.  Bilateral common iliac artery stents are widely patent.  No outpatient medications have been marked as taking for the 12/17/22 encounter (Appointment) with Delana Meyer, Dolores Lory, MD.    Past Medical History:  Diagnosis Date   Abdominal aortic aneurysm without rupture ([place])    Anemia    takes B12 injections monthly   Anogenital (venereal) warts    Anxiety    takes Xanax daily as needed   Arthritis    back    Back pain    synovial cyst   Carpal tunnel syndrome    Celiac artery atherosclerosis    Collagen vascular disease (HCC)    Hx Lupus.   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    Hx of adenomatous colonic polyps    Hyperlipidemia    takes Pravastatin nightly   Hypertension    takes Lisinopril-HCTZ and Metoprolol daily   Hyponatremia    Iliac artery aneurysm (HCC)    Lupus (HCC)    takes Plaquenil and Imran daily   Nocturia    Numbness and tingling    left hip/leg/foot   Peripheral vascular disease (HCC)    Peripheral vascular disease (HCC)    Rotator cuff tendonitis    Urinary frequency     Past Surgical History:  Procedure Laterality Date   BACK SURGERY     BIOPSY  08/28/2021   Procedure: BIOPSY;  Surgeon: Irving Copas., MD;  Location: [facility];  Service: Gastroenterology;;   BROW LIFT Bilateral 01/27/2021   Procedure: BLEPHAROPLASTY UPPER EYELID; W/EXCESS SKIN BROW PTOSIS REPAIR AND BLEPHAROPTOSIS REPAIR; RESECT EX BILATERAL;  Surgeon: Karle Starch, MD;  Location: [address];  Service: Ophthalmology;  Laterality: Bilateral;   CARDIAC CATHETERIZATION  2012   COLONOSCOPY     COLONOSCOPY WITH PROPOFOL N/A 09/30/2019   Procedure: COLONOSCOPY WITH PROPOFOL;  Surgeon: Robert Bellow, MD;  Location: [facility];  Service: Endoscopy;  Laterality: N/A;   COLONOSCOPY WITH PROPOFOL N/A 05/05/2021   Procedure: COLONOSCOPY WITH PROPOFOL;  Surgeon: [name], MD;  Location: [facility];  Service: Endoscopy;  Laterality: N/A;   CORONARY ANGIOPLASTY     1 stent   ESOPHAGOGASTRODUODENOSCOPY     ESOPHAGOGASTRODUODENOSCOPY (EGD) WITH PROPOFOL N/A 05/05/2021   Procedure: ESOPHAGOGASTRODUODENOSCOPY (EGD) WITH PROPOFOL;  Surgeon: [name], MD;  Location: [facility];  Service: Endoscopy;  Laterality: N/A;   ESOPHAGOGASTRODUODENOSCOPY (EGD) WITH PROPOFOL N/A 08/28/2021   Procedure: ESOPHAGOGASTRODUODENOSCOPY (EGD) WITH PROPOFOL;  Surgeon: Rush Landmark Telford Nab., MD;  Location: [facility];  Service: Gastroenterology;  Laterality: N/A;    ESOPHAGOGASTRODUODENOSCOPY (EGD) WITH PROPOFOL N/A 08/17/2022   Procedure: ESOPHAGOGASTRODUODENOSCOPY (EGD) WITH PROPOFOL;  Surgeon: [name], MD;  Location: [facility];  Service: Endoscopy;  Laterality: N/A;   EUS N/A 08/28/2021   Procedure: UPPER ENDOSCOPIC ULTRASOUND (EUS) RADIAL;  Surgeon: Irving Copas., MD;  Location: [facility];  Service: Gastroenterology;  Laterality: N/A;   LUMBAR LAMINECTOMY/DECOMPRESSION MICRODISCECTOMY Left 02/03/2015   Procedure: LUMBAR LAMINECTOMY/DECOMPRESSION MICRODISCECTOMY LEFT LUMBAR FOUR-FIVE WITH REMOVAL OF SYNOVIAL CYST;  Surgeon: Newman Pies, MD;  Location: [facility];  Service: Neurosurgery;  Laterality: Left;   NASAL SEPTOPLASTY W/ TURBINOPLASTY Bilateral 10/23/2019   Procedure: NASAL SEPTOPLASTY WITH SUBMUCOUS RESECTION OF TURBINATE;  Surgeon: Beverly Gust, MD;  Location: [place];  Service: ENT;  Laterality: Bilateral;   PELVIC ANGIOGRAPHY N/A 05/02/2018   Procedure: PELVIC ANGIOGRAPHY;  Surgeon: Katha Cabal, MD;  Location: [place] [facility];  Service: Cardiovascular;  Laterality: N/A;   POLYPECTOMY  08/28/2021   Procedure: POLYPECTOMY;  Surgeon: Rush Landmark Telford Nab., MD;  Location: [facility];  Service: Gastroenterology;;   SHOULDER ARTHROSCOPY WITH SUBACROMIAL DECOMPRESSION AND BICEP TENDON REPAIR Right 12/04/2018   Procedure: SHOULDER ARTHROSCOPY WITH DEBRIDEMENT, DECOMPRESSION, BICEP TENODESIS;  Surgeon: Corky Mull, MD;  Location: [facility];  Service: Orthopedics;  Laterality: Right;   SUBMUCOSAL INJECTION  08/28/2021   Procedure: SUBMUCOSAL INJECTION;  Surgeon: Irving Copas., MD;  Location: [facility];  Service: Gastroenterology;;    Social History Social History   Tobacco Use   Smoking status: Former    Packs/day: 1.50    Years: 30.00    Total pack years: 45.00    Types: Cigarettes    Quit date: 03/31/2021    Years since quitting: 1.7   Smokeless tobacco: Never   Vaping Use   Vaping Use: Never used  Substance Use Topics   Alcohol use: Yes    Comment: cocktails about 2 daily   Drug use: No    Family History Family History  Problem Relation Age of Onset   Leukemia Mother        dx 89s   Hyperlipidemia Father    Hypertension Father    Diabetes Father    Colon cancer Father        dx 21s   Hearing loss Father    Vascular Disease Father    Lung cancer Brother        d. 5    Allergies  Allergen Reactions   Fosamax [Alendronate Sodium]     Leg pain   Percocet [Oxycodone-Acetaminophen] Nausea And Vomiting     REVIEW OF SYSTEMS (Negative unless checked)  Constitutional: '[]'$ Weight loss  '[]'$ Fever  '[]'$ Chills Cardiac: '[]'$ Chest pain   '[]'$ Chest pressure   '[]'$ Palpitations   '[]'$ Shortness of breath when laying flat   '[]'$ Shortness of breath with exertion. Vascular:  '[x]'$ Pain in legs with walking   '[]'$ Pain in legs at rest  '[]'$ History of DVT   '[]'$ Phlebitis   '[]'$ Swelling in legs   '[]'$ Varicose veins   '[]'$   Non-healing ulcers Pulmonary:   '[]'$ Uses home oxygen   '[]'$ Productive cough   '[]'$ Hemoptysis   '[]'$ Wheeze  '[]'$ COPD   '[]'$ Asthma Neurologic:  '[]'$ Dizziness   '[]'$ Seizures   '[]'$ History of stroke   '[]'$ History of TIA  '[]'$ Aphasia   '[]'$ Vissual changes   '[]'$ Weakness or numbness in arm   '[]'$ Weakness or numbness in leg Musculoskeletal:   '[]'$ Joint swelling   '[]'$ Joint pain   '[]'$ Low back pain Hematologic:  '[]'$ Easy bruising  '[]'$ Easy bleeding   '[]'$ Hypercoagulable state   '[]'$ Anemic Gastrointestinal:  '[]'$ Diarrhea   '[]'$ Vomiting  '[]'$ Gastroesophageal reflux/heartburn   '[]'$ Difficulty swallowing. Genitourinary:  '[]'$ Chronic kidney disease   '[]'$ Difficult urination  '[]'$ Frequent urination   '[]'$ Blood in urine Skin:  '[]'$ Rashes   '[]'$ Ulcers  Psychological:  '[]'$ History of anxiety   '[]'$  History of major depression.  Physical Examination  There were no vitals filed for this visit. There is no height or weight on file to calculate BMI. Gen: WD/WN, NAD Head: [place]/AT, No temporalis wasting.  Ear/Nose/Throat: Hearing grossly intact,  nares w/o erythema or drainage Eyes: PER, EOMI, sclera nonicteric.  Neck: Supple, no masses.  No bruit or JVD.  Pulmonary:  Good air movement, no audible wheezing, no use of accessory muscles.  Cardiac: RRR, normal S1, S2, no Murmurs. Vascular:  mild trophic changes, no open wounds Vessel Right Left  Radial Palpable Palpable  PT Not Palpable Not Palpable  DP Not Palpable Not Palpable  Gastrointestinal: soft, non-distended. No guarding/no peritoneal signs.  Musculoskeletal: M/S 5/5 throughout.  No visible deformity.  Neurologic: CN 2-12 intact. Pain and light touch intact in extremities.  Symmetrical.  Speech is fluent. Motor exam as listed above. Psychiatric: Judgment intact, Mood & affect appropriate for pt's clinical situation. Dermatologic: No rashes or ulcers noted.  No changes consistent with cellulitis.   CBC Lab Results  Component Value Date   WBC 5.0 06/04/2022   HGB 15.3 06/04/2022   HCT 43.8 06/04/2022   MCV 96.1 06/04/2022   PLT 226 06/04/2022    BMET    Component Value Date/Time   NA 132 (L) 06/04/2022 1355   K 3.6 06/04/2022 1355   CL 105 06/04/2022 1355   CO2 23 06/04/2022 1355   GLUCOSE 110 (H) 06/04/2022 1355   BUN 19 06/04/2022 1355   CREATININE 0.79 06/04/2022 1355   CALCIUM 8.4 (L) 06/04/2022 1355   GFRNONAA >60 06/04/2022 1355   GFRAA >60 05/02/2018 1209   CrCl cannot be calculated (Patient's most recent lab result is older than the maximum 21 days allowed.).  COAG No results found for: "INR", "PROTIME"  Radiology No results found.   Assessment/Plan 1. Atherosclerosis of native artery of right lower extremity with intermittent claudication (HCC) Recommend:   The patient has evidence of atherosclerosis of the lower extremities with claudication.  The patient does not voice lifestyle limiting changes at this point in time.   Noninvasive studies do not suggest clinically significant change.   No invasive studies, angiography or surgery at  this time The patient should continue walking and begin a more formal exercise program.  The patient should continue antiplatelet therapy and aggressive treatment of the lipid abnormalities   No changes in the patient's medications at this time - VAS [place] ABI WITH/WO TBI - VAS [place] ABI WITH/WO TBI; Future  2. Infrarenal abdominal aortic aneurysm (AAA) without rupture (HCC) No surgery or intervention at this time. The patient has an asymptomatic abdominal aortic aneurysm that is less than 4 cm in maximal diameter.  I have discussed the  natural history of abdominal aortic aneurysm and the small risk of rupture for aneurysm less than 5 cm in size.  However, as these small aneurysms tend to enlarge over time, continued surveillance with ultrasound or CT scan is mandatory.  I have also discussed optimizing medical management with hypertension and lipid control and the importance of abstinence from tobacco.  The patient is also encouraged to exercise a minimum of 30 minutes 4 times a week.  Should the patient develop new onset abdominal or back pain or signs of peripheral embolization they are instructed to seek medical attention immediately and to alert the physician providing care that they have an aneurysm.  The patient voices their understanding. The patient will return in 24 months with an aortic duplex.   3. Essential hypertension Continue antihypertensive medications as already ordered, these medications have been reviewed and there are no changes at this time.  4. Coronary arteriosclerosis in native artery Continue cardiac and antihypertensive medications as already ordered and reviewed, no changes at this time.  Continue statin as ordered and reviewed, no changes at this time  Nitrates PRN for chest pain  5. Hyperlipidemia, unspecified hyperlipidemia type Continue statin as ordered and reviewed, no changes at this time    Hortencia Pilar, MD  12/15/2022 1:03 PM

## 2022-12-17 ENCOUNTER — Encounter (INDEPENDENT_AMBULATORY_CARE_PROVIDER_SITE_OTHER): Payer: Self-pay | Admitting: Vascular Surgery

## 2022-12-17 ENCOUNTER — Ambulatory Visit (INDEPENDENT_AMBULATORY_CARE_PROVIDER_SITE_OTHER): Payer: Medicare Other | Admitting: Vascular Surgery

## 2022-12-17 ENCOUNTER — Ambulatory Visit (INDEPENDENT_AMBULATORY_CARE_PROVIDER_SITE_OTHER): Payer: Medicare Other

## 2022-12-17 VITALS — BP 132/88 | HR 77 | Resp 16 | Wt 199.0 lb

## 2022-12-17 DIAGNOSIS — I10 Essential (primary) hypertension: Secondary | ICD-10-CM

## 2022-12-17 DIAGNOSIS — I70211 Atherosclerosis of native arteries of extremities with intermittent claudication, right leg: Secondary | ICD-10-CM

## 2022-12-17 DIAGNOSIS — I7143 Infrarenal abdominal aortic aneurysm, without rupture: Secondary | ICD-10-CM

## 2022-12-17 DIAGNOSIS — E785 Hyperlipidemia, unspecified: Secondary | ICD-10-CM

## 2022-12-17 DIAGNOSIS — I2510 Atherosclerotic heart disease of native coronary artery without angina pectoris: Secondary | ICD-10-CM

## 2022-12-18 ENCOUNTER — Encounter (INDEPENDENT_AMBULATORY_CARE_PROVIDER_SITE_OTHER): Payer: Self-pay | Admitting: Vascular Surgery

## 2022-12-21 LAB — VAS US ABI WITH/WO TBI
Left ABI: 1.07
Right ABI: 1.1

## 2023-01-14 ENCOUNTER — Inpatient Hospital Stay: Payer: Medicare Other | Attending: Oncology

## 2023-01-14 DIAGNOSIS — Z7982 Long term (current) use of aspirin: Secondary | ICD-10-CM | POA: Insufficient documentation

## 2023-01-14 DIAGNOSIS — D472 Monoclonal gammopathy: Secondary | ICD-10-CM | POA: Insufficient documentation

## 2023-01-14 DIAGNOSIS — Z79899 Other long term (current) drug therapy: Secondary | ICD-10-CM | POA: Diagnosis not present

## 2023-01-14 DIAGNOSIS — Z801 Family history of malignant neoplasm of trachea, bronchus and lung: Secondary | ICD-10-CM | POA: Insufficient documentation

## 2023-01-14 DIAGNOSIS — Z86010 Personal history of colonic polyps: Secondary | ICD-10-CM | POA: Diagnosis not present

## 2023-01-14 DIAGNOSIS — K219 Gastro-esophageal reflux disease without esophagitis: Secondary | ICD-10-CM | POA: Insufficient documentation

## 2023-01-14 DIAGNOSIS — K317 Polyp of stomach and duodenum: Secondary | ICD-10-CM | POA: Insufficient documentation

## 2023-01-14 DIAGNOSIS — Z79631 Long term (current) use of antimetabolite agent: Secondary | ICD-10-CM | POA: Diagnosis not present

## 2023-01-14 DIAGNOSIS — I700 Atherosclerosis of aorta: Secondary | ICD-10-CM | POA: Insufficient documentation

## 2023-01-14 DIAGNOSIS — E785 Hyperlipidemia, unspecified: Secondary | ICD-10-CM | POA: Insufficient documentation

## 2023-01-14 DIAGNOSIS — C169 Malignant neoplasm of stomach, unspecified: Secondary | ICD-10-CM | POA: Diagnosis present

## 2023-01-14 DIAGNOSIS — Z806 Family history of leukemia: Secondary | ICD-10-CM | POA: Insufficient documentation

## 2023-01-14 DIAGNOSIS — N400 Enlarged prostate without lower urinary tract symptoms: Secondary | ICD-10-CM | POA: Insufficient documentation

## 2023-01-14 DIAGNOSIS — N62 Hypertrophy of breast: Secondary | ICD-10-CM | POA: Insufficient documentation

## 2023-01-14 DIAGNOSIS — Z800 Family history of malignant neoplasm of digestive organs: Secondary | ICD-10-CM | POA: Insufficient documentation

## 2023-01-14 DIAGNOSIS — I2510 Atherosclerotic heart disease of native coronary artery without angina pectoris: Secondary | ICD-10-CM | POA: Insufficient documentation

## 2023-01-14 DIAGNOSIS — I739 Peripheral vascular disease, unspecified: Secondary | ICD-10-CM | POA: Insufficient documentation

## 2023-01-14 DIAGNOSIS — K2950 Unspecified chronic gastritis without bleeding: Secondary | ICD-10-CM | POA: Insufficient documentation

## 2023-01-14 DIAGNOSIS — K921 Melena: Secondary | ICD-10-CM | POA: Insufficient documentation

## 2023-01-14 DIAGNOSIS — Z79624 Long term (current) use of inhibitors of nucleotide synthesis: Secondary | ICD-10-CM | POA: Diagnosis not present

## 2023-01-14 DIAGNOSIS — I10 Essential (primary) hypertension: Secondary | ICD-10-CM | POA: Diagnosis not present

## 2023-01-14 DIAGNOSIS — C7A8 Other malignant neuroendocrine tumors: Secondary | ICD-10-CM

## 2023-01-14 LAB — CBC WITH DIFFERENTIAL/PLATELET
Abs Immature Granulocytes: 0.04 10*3/uL (ref 0.00–0.07)
Basophils Absolute: 0 10*3/uL (ref 0.0–0.1)
Basophils Relative: 1 %
Eosinophils Absolute: 0.1 10*3/uL (ref 0.0–0.5)
Eosinophils Relative: 3 %
HCT: 45.3 % (ref 39.0–52.0)
Hemoglobin: 15.4 g/dL (ref 13.0–17.0)
Immature Granulocytes: 1 %
Lymphocytes Relative: 24 %
Lymphs Abs: 1.2 10*3/uL (ref 0.7–4.0)
MCH: 32.2 pg (ref 26.0–34.0)
MCHC: 34 g/dL (ref 30.0–36.0)
MCV: 94.8 fL (ref 80.0–100.0)
Monocytes Absolute: 0.4 10*3/uL (ref 0.1–1.0)
Monocytes Relative: 9 %
Neutro Abs: 3.1 10*3/uL (ref 1.7–7.7)
Neutrophils Relative %: 62 %
Platelets: 232 10*3/uL (ref 150–400)
RBC: 4.78 MIL/uL (ref 4.22–5.81)
RDW: 13 % (ref 11.5–15.5)
WBC: 4.9 10*3/uL (ref 4.0–10.5)
nRBC: 0 % (ref 0.0–0.2)

## 2023-01-14 LAB — COMPREHENSIVE METABOLIC PANEL
ALT: 47 U/L — ABNORMAL HIGH (ref 0–44)
AST: 43 U/L — ABNORMAL HIGH (ref 15–41)
Albumin: 3.9 g/dL (ref 3.5–5.0)
Alkaline Phosphatase: 73 U/L (ref 38–126)
Anion gap: 9 (ref 5–15)
BUN: 12 mg/dL (ref 8–23)
CO2: 23 mmol/L (ref 22–32)
Calcium: 8.9 mg/dL (ref 8.9–10.3)
Chloride: 103 mmol/L (ref 98–111)
Creatinine, Ser: 0.8 mg/dL (ref 0.61–1.24)
GFR, Estimated: >60 mL/min (ref >60)
Glucose, Bld: 100 mg/dL — ABNORMAL HIGH (ref 70–99)
Potassium: 3.7 mmol/L (ref 3.5–5.1)
Sodium: 135 mmol/L (ref 135–145)
Total Bilirubin: 0.9 mg/dL (ref 0.3–1.2)
Total Protein: 7.2 g/dL (ref 6.5–8.1)

## 2023-01-14 LAB — VITAMIN B12: Vitamin B-12: 440 pg/mL (ref 180–914)

## 2023-01-15 ENCOUNTER — Other Ambulatory Visit: Payer: Self-pay

## 2023-01-15 DIAGNOSIS — C7A8 Other malignant neuroendocrine tumors: Secondary | ICD-10-CM

## 2023-01-15 DIAGNOSIS — C169 Malignant neoplasm of stomach, unspecified: Secondary | ICD-10-CM | POA: Diagnosis not present

## 2023-01-15 LAB — KAPPA/LAMBDA LIGHT CHAINS
Kappa free light chain: 19.1 mg/L (ref 3.3–19.4)
Kappa, lambda light chain ratio: 1.36 (ref 0.26–1.65)
Lambda free light chains: 14 mg/L (ref 5.7–26.3)

## 2023-01-17 LAB — CHROMOGRANIN A: Chromogranin A (ng/mL): 1536 ng/mL — ABNORMAL HIGH (ref 0.0–101.8)

## 2023-01-17 LAB — 5 HIAA, QUANTITATIVE, URINE, 24 HOUR
5-HIAA, Ur: 1.7 mg/L
5-HIAA,Quant.,24 Hr Urine: 4.8 mg/24 hr (ref 0.0–14.9)
Total Volume: 2800

## 2023-01-21 ENCOUNTER — Encounter: Payer: Self-pay | Admitting: Oncology

## 2023-01-21 ENCOUNTER — Inpatient Hospital Stay (HOSPITAL_BASED_OUTPATIENT_CLINIC_OR_DEPARTMENT_OTHER): Payer: Medicare Other | Admitting: Oncology

## 2023-01-21 VITALS — BP 141/88 | HR 90 | Temp 97.0°F | Resp 18 | Wt 199.5 lb

## 2023-01-21 DIAGNOSIS — Z1589 Genetic susceptibility to other disease: Secondary | ICD-10-CM | POA: Diagnosis not present

## 2023-01-21 DIAGNOSIS — D472 Monoclonal gammopathy: Secondary | ICD-10-CM

## 2023-01-21 DIAGNOSIS — Z1509 Genetic susceptibility to other malignant neoplasm: Secondary | ICD-10-CM

## 2023-01-21 DIAGNOSIS — K219 Gastro-esophageal reflux disease without esophagitis: Secondary | ICD-10-CM

## 2023-01-21 DIAGNOSIS — C169 Malignant neoplasm of stomach, unspecified: Secondary | ICD-10-CM | POA: Diagnosis not present

## 2023-01-21 DIAGNOSIS — C7A8 Other malignant neuroendocrine tumors: Secondary | ICD-10-CM

## 2023-01-21 LAB — MULTIPLE MYELOMA PANEL, SERUM
Albumin SerPl Elph-Mcnc: 3.9 g/dL (ref 2.9–4.4)
Albumin/Glob SerPl: 1.5 (ref 0.7–1.7)
Alpha 1: 0.2 g/dL (ref 0.0–0.4)
Alpha2 Glob SerPl Elph-Mcnc: 0.7 g/dL (ref 0.4–1.0)
B-Globulin SerPl Elph-Mcnc: 0.9 g/dL (ref 0.7–1.3)
Gamma Glob SerPl Elph-Mcnc: 0.8 g/dL (ref 0.4–1.8)
Globulin, Total: 2.7 g/dL (ref 2.2–3.9)
IgA: 243 mg/dL (ref 61–437)
IgG (Immunoglobin G), Serum: 903 mg/dL (ref 603–1613)
IgM (Immunoglobulin M), Srm: 104 mg/dL (ref 20–172)
M Protein SerPl Elph-Mcnc: 0.3 g/dL — ABNORMAL HIGH
Total Protein ELP: 6.6 g/dL (ref 6.0–8.5)

## 2023-01-21 NOTE — Assessment & Plan Note (Signed)
Discussed with patient about lifestyle modification, avoiding caffeine, spicy food, alcohol, healthy weight loss etc.

## 2023-01-21 NOTE — Assessment & Plan Note (Signed)
Stable M protein and light chain ratio.  Continue observation.  Repeat in 1 year.

## 2023-01-21 NOTE — Progress Notes (Signed)
Pt here for follow up. No new hematology concerns voiced

## 2023-01-21 NOTE — Assessment & Plan Note (Signed)
Patient had discussion with genetic counselor. 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 Recommend regular self skin examination, application of sunscreen, follow-up with dermatologist annually.

## 2023-01-21 NOTE — Progress Notes (Signed)
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 Patient is on chronic PPI with initial high gastrin level.  Multiple gastric polyps.   Previously discussed with pathologist Dr. Dicie Beam, there is no typical findings of  chronic atrophic gastritis to suggest a typical Type I.The complete normalization of hypergastrinemia after holding PPI is not typical for type II hypergastrinemia secondary to gastrinoma.Type III is a possibility and hard to exclude. 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 Tumors arising in the setting of chronic PPI use appear to behave in a less malignant fashion than true-sporadic tumors. Recommend patient to continue EGD surveillance. Labs reviewed and discussed with patient.  Chronically elevated chromogranin A, assumed to be due to PPI.  Recommend repeat Dotatate PET   GERD (gastroesophageal reflux disease) Discussed with patient about lifestyle modification, avoiding caffeine, spicy food, alcohol, healthy weight loss etc.  Monoclonal gammopathy Stable M protein and light chain ratio.  Continue observation.  Repeat in 1 year.  Monoallelic mutation of MITF gene Patient had discussion with genetic counselor. 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 Recommend regular self skin examination, application of sunscreen, follow-up with dermatologist annually.    Orders Placed This Encounter  Procedures   NM PET DOTATATE SKULL BASE TO MID THIGH    Standing Status:   Future    Standing Expiration Date:   01/21/2024    Order Specific Question:   If indicated for the ordered procedure, I authorize the administration of a  radiopharmaceutical per Radiology protocol    Answer:   Yes    Order Specific Question:   Preferred imaging location?    Answer:   [facility]   CBC with Differential ([facility] Only)    Standing Status:   Future    Standing Expiration Date:   01/21/2024   CMP ([facility] only)    Standing Status:   Future    Standing Expiration Date:   01/21/2024   Multiple Myeloma Panel (SPEP&IFE w/QIG)    Standing Status:   Future    Standing Expiration Date:   01/21/2024   Kappa/lambda light chains    Standing Status:   Future    Standing Expiration Date:   01/21/2024   Vitamin B12    Standing Status:   Future    Standing Expiration Date:   01/21/2024   5 HIAA, quantitative, urine, 24 hour    Standing Status:   Future    Standing Expiration Date:   01/21/2024   Chromogranin A    Standing Status:   Future    Standing Expiration Date:   01/21/2024   Follow-up in 1 year. All questions were answered. The patient knows to call the clinic with any problems, questions or concerns.  Earlie Server, MD, PhD [facility] Hematology Oncology 01/21/2023   HISTORY OF PRESENTING ILLNESS:   Sergio Callahan is a  72 y.o.  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  Was referred to establish with gastroenterology. 05/05/2021, patient underwent EGD and colonoscopy by Dr. Haig Prophet. EGD showed gastritis, erythematous mucosa in the gastric  fundus, multiple fundic gland polyps, resected and retrieved. Colonoscopy showed multiple colon polyps, resected and retrieved.  Diverticulosis.  Erythematous mucosa in the sigmoid colon.  CASE: [phone]  PATIENT: Sergio Callahan  Surgical Pathology Report  Specimen Submitted:  A. Stomach; cbx  B. Stomach polyp; cbx  C. Stomach, abnormal mucosa; cbx  D. Stomach polyp, inflammatory; cold snare  E. Colon  polyp, rectosigmoid; cbx  F. Colon polyp, cecum; cbx  G. Colon polyp x2, transverse; cold snare (1) and cbx (1)  H. Colon, sigmoid, abnormal mucosa; cbx  I. Colon polyp, sigmoid; cbx   Clinical History: GERD, heme positive. Findings: Gastric polyps,  gastritis, colon polyps    DIAGNOSIS:  A.  STOMACH; COLD BIOPSY:  - ANTRAL-TYPE MUCOSA WITH REACTIVE FOVEOLAR HYPERPLASIA.  - OXYNTIC MUCOSA WITH PROTON PUMP INHIBITOR EFFECT.  - NEGATIVE FOR H. PYLORI, INTESTINAL METAPLASIA, DYSPLASIA, AND  MALIGNANCY.   B.  STOMACH POLYP; COLD BIOPSY:  - FUNDIC GLAND POLYP.  - NEGATIVE FOR DYSPLASIA AND MALIGNANCY.   C.  STOMACH, ABNORMAL MUCOSA; COLD BIOPSY:  - OXYNTIC-TYPE MUCOSA WITH REACTIVE FOVEOLAR HYPERPLASIA, HEMORRHAGE,  AND STROMAL CHANGES SUGGESTIVE OF HEALED MUCOSAL INJURY.  - NEGATIVE FOR INTESTINAL METAPLASIA, DYSPLASIA, AND MALIGNANCY.   D.  STOMACH POLYP, "INFLAMMATORY"; COLD SNARE:  - WELL-DIFFERENTIATED NEUROENDOCRINE TUMOR (NET G2), AT LEAST 5 MM IN  SIZE, SEE COMMENT.  - TUMOR INVOLVES THE MUSCULARIS MUCOSAE AND EXTENDS TO THE INKED DEEP  MARGIN.   E.  COLON POLYP, RECTOSIGMOID; COLD BIOPSY:  - HYPERPLASTIC POLYP.  - NEGATIVE FOR DYSPLASIA AND MALIGNANCY.   F.  COLON POLYP, CECUM; COLD BIOPSY:  - TUBULAR ADENOMA.  - NEGATIVE FOR HIGH-GRADE DYSPLASIA AND MALIGNANCY.   G.  COLON POLYP X 2; COLD SNARE (1) AND COLD BIOPSY (1):  - TUBULAR ADENOMA.  - NEGATIVE FOR HIGH-GRADE DYSPLASIA AND MALIGNANCY.  - ONLY 1 PIECE OF TISSUE FOUND IN THE CONTAINER.   H.  SIGMOID COLON, ABNORMAL MUCOSA; COLD BIOPSY:  - COLONIC MUCOSA WITH INTACT CRYPT ARCHITECTURE.  - LYMPHOID AGGREGATES AND CONGESTION.  - NEGATIVE FOR DYSPLASIA AND MALIGNANCY.   I.  COLON POLYP, SIGMOID; COLD BIOPSY:  - HYPERPLASTIC POLYP.  - NEGATIVE FOR DYSPLASIA AND MALIGNANCY.   Patient denies any stomach discomfort today. He is on omeprazole 20 mg daily chronically.  He drinks alcohol on some days. Lupus,  follows up with Dr. Jefm Bryant.  He is on Imuran and Plaquenil Denies any unintentional weight loss, night sweats, diarrhea, facial flushing, fever.   Gastrin level was initially elevated on  with a level of 759, repeat after holding PPI for 2 weeks on 07/10/2021 showed a normalized level of 76.   06/20/2021 Dotatate PET scan showed no focal radial tracer activity within the stomach to localize well differentiated neuroendocrine tumor.  Diffuse physiological activity throughout the stomach.  No evidence of well-differentiated metastatic neuroendocrine tumor or adenopathy.  08/28/2021, patient underwent EUS by Dr.Mansouraty FINAL MICROSCOPIC DIAGNOSIS:  A. STOMACH, BODY, POLYPECTOMY:  - Fundic gland polyp  - Negative for dysplasia  B. STOMACH, ANTRUM, BIOPSY:  - Gastric antral mucosa with mild chronic gastritis  - Warthin Starry stain is negative for Helicobacter pylori  C. STOMACH, INCISURA, BIOPSY:  - Gastric oxyntic mucosa with parietal cell hyperplasia as can be seen  in hypergastrinemic states such as PPI therapy.  - Negative for atrophy or intestinal metaplasia  D. STOMACH, GREATER CURVE, BIOPSY:  - Gastric oxyntic mucosa with parietal cell hyperplasia as can be seen  in hypergastrinemic states such  as PPI therapy.  - Negative for atrophy or intestinal metaplasia  E. STOMACH, LESSER CURVE, BIOPSY:  - Gastric oxyntic mucosa with parietal cell hyperplasia as can be seen  in hypergastrinemic states such as PPI therapy.  - Negative for atrophy or intestinal metaplasia  F. STOMACH, CARDIA AND FUNDUS, BIOPSY:  - Gastric oxyntic mucosa with parietal cell hyperplasia as can be seen  in hypergastrinemic states such as PPI therapy.  - Negative for atrophy or intestinal metaplasia  For gynecomastia work-up.  Patient underwent bilateral diagnostic mammogram on 06/12/2021.  Benign left greater than right gynecomastia.  Normal right axillary ultrasound. Patient underwent left breast vacuum-assisted  core needle biopsy recommended by Dr. Bary Castilla, pathology showed benign mammary parenchyma with gynecomastia.  Negative for atypical proliferation breast disease.  INTERVAL HISTORY Sergio Callahan is a 72 y.o. male who has above history reviewed by me today presents for follow up visit for gastric neuroendocrine carcinoma, MGUS.  For neuroendocrine carcinoma, patient follows with gastroenterology and is on  EGD every surveillance.  Last EGD was on 08/17/2022   He has no new complaints.  Continues to take PPI for acid reflux.  He forgets to hold PPI prior to his blood work.    Review of Systems  Constitutional:  Negative for appetite change, chills, fatigue, fever and unexpected weight change.  HENT:   Negative for hearing loss and voice change.   Eyes:  Negative for eye problems and icterus.  Respiratory:  Negative for chest tightness, cough and shortness of breath.   Cardiovascular:  Negative for chest pain and leg swelling.  Gastrointestinal:  Negative for abdominal distention and abdominal pain.  Endocrine: Negative for hot flashes.  Genitourinary:  Negative for difficulty urinating, dysuria and frequency.   Musculoskeletal:  Negative for arthralgias.  Skin:  Negative for itching and rash.  Neurological:  Negative for light-headedness and numbness.  Hematological:  Negative for adenopathy. Does not bruise/bleed easily.  Psychiatric/Behavioral:  Negative for confusion.     MEDICAL HISTORY:  Past Medical History:  Diagnosis Date   Abdominal aortic aneurysm without rupture ([place])    Anemia    takes B12 injections monthly   Anogenital (venereal) warts    Anxiety    takes Xanax daily as needed   Arthritis    back    Back pain    synovial cyst   Carpal tunnel syndrome    Celiac artery atherosclerosis    Collagen vascular disease (HCC)    Hx Lupus.   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    Hx of adenomatous colonic polyps    Hyperlipidemia    takes Pravastatin nightly   Hypertension    takes Lisinopril-HCTZ and Metoprolol daily   Hyponatremia    Iliac artery aneurysm (HCC)    Lupus (HCC)    takes Plaquenil and Imran daily   Nocturia    Numbness and tingling    left hip/leg/foot   Peripheral vascular disease (HCC)    Peripheral vascular disease (HCC)    Rotator cuff tendonitis    Urinary frequency     SURGICAL HISTORY: Past Surgical History:  Procedure Laterality Date  BACK SURGERY     BIOPSY  08/28/2021   Procedure: BIOPSY;  Surgeon: Rush Landmark Telford Nab., MD;  Location: [facility];  Service: Gastroenterology;;   BROW LIFT Bilateral 01/27/2021   Procedure: BLEPHAROPLASTY UPPER EYELID; W/EXCESS SKIN BROW PTOSIS REPAIR AND BLEPHAROPTOSIS REPAIR; RESECT EX BILATERAL;  Surgeon: Karle Starch, MD;  Location: [facility];  Service: Ophthalmology;  Laterality: Bilateral;   CARDIAC CATHETERIZATION  2012   COLONOSCOPY     COLONOSCOPY WITH PROPOFOL N/A 09/30/2019   Procedure: COLONOSCOPY WITH PROPOFOL;  Surgeon: Robert Bellow, MD;  Location: [facility];  Service: Endoscopy;  Laterality: N/A;   COLONOSCOPY WITH PROPOFOL N/A 05/05/2021   Procedure: COLONOSCOPY WITH PROPOFOL;  Surgeon: [name], MD;  Location: [facility];  Service: Endoscopy;  Laterality: N/A;   CORONARY ANGIOPLASTY     1 stent   ESOPHAGOGASTRODUODENOSCOPY     ESOPHAGOGASTRODUODENOSCOPY (EGD) WITH PROPOFOL N/A 05/05/2021   Procedure: ESOPHAGOGASTRODUODENOSCOPY (EGD) WITH PROPOFOL;  Surgeon: [name], MD;  Location: [facility];  Service: Endoscopy;  Laterality: N/A;   ESOPHAGOGASTRODUODENOSCOPY (EGD) WITH PROPOFOL N/A 08/28/2021   Procedure: ESOPHAGOGASTRODUODENOSCOPY (EGD)  WITH PROPOFOL;  Surgeon: Rush Landmark Telford Nab., MD;  Location: [facility];  Service: Gastroenterology;  Laterality: N/A;   ESOPHAGOGASTRODUODENOSCOPY (EGD) WITH PROPOFOL N/A 08/17/2022   Procedure: ESOPHAGOGASTRODUODENOSCOPY (EGD) WITH PROPOFOL;  Surgeon: [name], MD;  Location: [facility];  Service: Endoscopy;  Laterality: N/A;   EUS N/A 08/28/2021   Procedure: UPPER ENDOSCOPIC ULTRASOUND (EUS) RADIAL;  Surgeon: Irving Copas., MD;  Location: [facility];  Service: Gastroenterology;  Laterality: N/A;   LUMBAR LAMINECTOMY/DECOMPRESSION MICRODISCECTOMY Left 02/03/2015   Procedure: LUMBAR LAMINECTOMY/DECOMPRESSION MICRODISCECTOMY LEFT LUMBAR FOUR-FIVE WITH REMOVAL OF SYNOVIAL CYST;  Surgeon: Newman Pies, MD;  Location: [facility];  Service: Neurosurgery;  Laterality: Left;   NASAL SEPTOPLASTY W/ TURBINOPLASTY Bilateral 10/23/2019   Procedure: NASAL SEPTOPLASTY WITH SUBMUCOUS RESECTION OF TURBINATE;  Surgeon: Beverly Gust, MD;  Location: [facility];  Service: ENT;  Laterality: Bilateral;   PELVIC ANGIOGRAPHY N/A 05/02/2018   Procedure: PELVIC ANGIOGRAPHY;  Surgeon: Katha Cabal, MD;  Location: [facility];  Service: Cardiovascular;  Laterality: N/A;   POLYPECTOMY  08/28/2021   Procedure: POLYPECTOMY;  Surgeon: Rush Landmark Telford Nab., MD;  Location: [facility];  Service: Gastroenterology;;   SHOULDER ARTHROSCOPY WITH SUBACROMIAL DECOMPRESSION AND BICEP TENDON REPAIR Right 12/04/2018   Procedure: SHOULDER ARTHROSCOPY WITH DEBRIDEMENT, DECOMPRESSION, BICEP TENODESIS;  Surgeon: Corky Mull, MD;  Location: [facility];  Service: Orthopedics;  Laterality: Right;   SUBMUCOSAL INJECTION  08/28/2021   Procedure: SUBMUCOSAL INJECTION;  Surgeon: Rush Landmark Telford Nab., MD;  Location: [facility];  Service: Gastroenterology;;    SOCIAL HISTORY: Social History   Socioeconomic History   Marital status: Married    Spouse name: Not on file    Number of children: Not on file   Years of education: Not on file   Highest education level: Not on file  Occupational History   Not on file  Tobacco Use   Smoking status: Former    Packs/day: 1.50    Years: 30.00    Total pack years: 45.00    Types: Cigarettes    Quit date: 03/31/2021    Years since quitting: 1.8   Smokeless tobacco: Never  Vaping Use   Vaping Use: Never used  Substance and Sexual Activity   Alcohol use: Yes    Comment: cocktails about 2 daily   Drug use: No   Sexual activity: Yes  Other Topics Concern   Not on file  Social History Narrative   Not on file   Social Determinants of Health   Financial Resource Strain: Not on file  Food Insecurity: Not on file  Transportation Needs: Not on file  Physical Activity: Not on file  Stress: Not on file  Social Connections: Not on file  Intimate Partner Violence: Not on file    FAMILY HISTORY: Family History  Problem Relation Age of Onset   Leukemia Mother        dx 36s   Hyperlipidemia Father    Hypertension Father    Diabetes Father    Colon cancer Father        dx 40s   Hearing loss Father    Vascular Disease Father    Lung cancer Brother        d. 93    ALLERGIES:  is allergic to fosamax [alendronate sodium] and percocet [oxycodone-acetaminophen].  MEDICATIONS:  Current Outpatient Medications  Medication Sig Dispense Refill   ALPRAZolam (XANAX) 0.5 MG tablet Take 0.5 mg by mouth 3 (three) times daily as needed for anxiety.      amLODipine (NORVASC) 5 MG tablet Take 5 mg by mouth daily.     aspirin EC 81 MG tablet Take 81 mg by mouth daily.     azaTHIOprine (IMURAN) 50 MG tablet Take 125 mg by mouth daily.      cetirizine (ZYRTEC) 10 MG tablet Take 10 mg by mouth daily.     cyanocobalamin (,VITAMIN B-12,) 1000 MCG/ML injection Inject 1,000 mcg into the muscle every 14 (fourteen) days.      ELDERBERRY PO Take 2 tablets by mouth daily.     finasteride (PROSCAR) 5 MG tablet Take 5 mg by mouth  daily.     hydrochlorothiazide (HYDRODIURIL) 12.5 MG tablet Take 12.5 mg by mouth daily.     hydroxychloroquine (PLAQUENIL) 200 MG tablet Take 200 mg by mouth 2 (two) times daily.     ibuprofen (ADVIL,MOTRIN) 200 MG tablet Take 400 mg by mouth every 8 (eight) hours as needed for headache or moderate pain.     metoprolol succinate (TOPROL-XL) 100 MG 24 hr tablet Take 100 mg by mouth at bedtime. Take with or immediately following a meal.     nicotine (NICODERM CQ - DOSED IN MG/24 HOURS) 21 mg/24hr patch Place 21 mg onto the skin daily.     omeprazole (PRILOSEC) 20 MG capsule Take 20 mg by mouth at bedtime.     pravastatin (PRAVACHOL) 40 MG tablet Take 40 mg by mouth at bedtime.     Probiotic Product (PROBIOTIC DAILY PO) Take 1 capsule by mouth daily.     spironolactone (ALDACTONE) 25 MG tablet Take 25 mg by mouth daily.     tadalafil (CIALIS) 10 MG tablet Take 1-2 tablets (10-20 mg total) by mouth daily as needed for erectile dysfunction. 30 tablet 11   tamsulosin (FLOMAX) 0.4 MG CAPS capsule Take 1 capsule (0.4 mg total) by mouth daily after breakfast. 30 capsule 11   triamcinolone (NASACORT) 55 MCG/ACT AERO nasal inhaler Place 1 spray into the nose daily as needed (allergies).     nitroGLYCERIN (NITROLINGUAL) 0.4 MG/SPRAY spray Place 1 spray under the tongue every 5 (five) minutes x 3 doses as needed for chest pain. (Patient not taking: Reported on 01/21/2023)     No current facility-administered medications for this visit.     PHYSICAL EXAMINATION: ECOG PERFORMANCE STATUS: 0 - Asymptomatic Vitals:   01/21/23 1038  BP: (!) 141/88  Pulse: 90  Resp: 18  Temp: (!) 97 F (36.1 C)   Filed Weights   01/21/23 1038  Weight: 199 lb 8 oz (90.5 kg)    Physical Exam Constitutional:      General: He is not in acute distress. HENT:     Head: Normocephalic and atraumatic.  Eyes:     General: No scleral icterus. Cardiovascular:     Rate and Rhythm: Normal rate and regular rhythm.     Heart  sounds: Normal heart sounds.  Pulmonary:     Effort: Pulmonary effort is normal. No respiratory distress.     Breath sounds: No wheezing.  Abdominal:     General: Bowel sounds are normal. There is no distension.     Palpations: Abdomen is soft.  Musculoskeletal:        General: No deformity. Normal range of motion.     Cervical back: Normal range of motion and neck supple.  Skin:    General: Skin is warm and dry.     Findings: No erythema or rash.  Neurological:     Mental Status: He is alert and oriented to person, place, and time. Mental status is at baseline.     Cranial Nerves: No cranial nerve deficit.     Coordination: Coordination normal.  Psychiatric:        Mood and Affect: Mood normal.      LABORATORY DATA:  I have reviewed the data as listed    Latest Ref Rng & Units 01/14/2023    9:38 AM 06/04/2022    1:55 PM 11/28/2021    1:40 PM  CBC  WBC 4.0 - 10.5 K/uL 4.9  5.0  5.2   Hemoglobin 13.0 - 17.0 g/dL 15.4  15.3  15.2   Hematocrit 39.0 - 52.0 % 45.3  43.8  43.7   Platelets 150 - 400 K/uL 232  226  251       Latest Ref Rng & Units 01/14/2023    9:38 AM 06/04/2022    1:55 PM 11/28/2021    1:40 PM  CMP  Glucose 70 - 99 mg/dL 100  110  89   BUN 8 - 23 mg/dL '12  19  12   '$ Creatinine 0.61 - 1.24 mg/dL 0.80  0.79  0.94   Sodium 135 - 145 mmol/L 135  132  136   Potassium 3.5 - 5.1 mmol/L 3.7  3.6  4.1   Chloride 98 - 111 mmol/L 103  105  100   CO2 22 - 32 mmol/L '23  23  29   '$ Calcium 8.9 - 10.3 mg/dL 8.9  8.4  9.0   Total Protein 6.5 - 8.1 g/dL 7.2  7.3  7.5   Total Bilirubin 0.3 - 1.2 mg/dL 0.9  0.9  0.7   Alkaline Phos 38 - 126 U/L 73  64  63   AST 15 - 41 U/L 43  32  37   ALT 0 - 44 U/L 47  34  39     RADIOGRAPHIC STUDIES: I have personally reviewed the radiological images as listed and agreed with the findings in the report. No results found.

## 2023-01-21 NOTE — Assessment & Plan Note (Addendum)
Gastric neuroendocrine carcinoma-well differentiated, Grade 2. Patient is on chronic PPI with initial high gastrin level.  Multiple gastric polyps.   Previously discussed with pathologist Dr. Dicie Beam, there is no typical findings of  chronic atrophic gastritis to suggest a typical Type I.The complete normalization of hypergastrinemia after holding PPI is not typical for type II hypergastrinemia secondary to gastrinoma.Type III is a possibility and hard to exclude. 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 Tumors arising in the setting of chronic PPI use appear to behave in a less malignant fashion than true-sporadic tumors. Recommend patient to continue EGD surveillance. Labs reviewed and discussed with patient.  Chronically elevated chromogranin A, assumed to be due to PPI.  Recommend repeat Dotatate PET

## 2023-01-22 ENCOUNTER — Other Ambulatory Visit: Payer: Self-pay | Admitting: Family Medicine

## 2023-01-22 DIAGNOSIS — M5416 Radiculopathy, lumbar region: Secondary | ICD-10-CM

## 2023-01-26 ENCOUNTER — Ambulatory Visit
Admission: RE | Admit: 2023-01-26 | Discharge: 2023-01-26 | Disposition: A | Discharge status: Home / Self Care | Payer: Medicare Other | Source: Ambulatory Visit | Attending: Family Medicine | Admitting: Family Medicine

## 2023-01-26 DIAGNOSIS — M5416 Radiculopathy, lumbar region: Secondary | ICD-10-CM

## 2023-02-04 ENCOUNTER — Ambulatory Visit
Admission: RE | Admit: 2023-02-04 | Discharge: 2023-02-04 | Disposition: A | Discharge status: Home / Self Care | Payer: Medicare Other | Source: Ambulatory Visit | Attending: Oncology | Admitting: Oncology

## 2023-02-04 DIAGNOSIS — D3A8 Other benign neuroendocrine tumors: Secondary | ICD-10-CM | POA: Diagnosis not present

## 2023-02-04 DIAGNOSIS — C7A8 Other malignant neuroendocrine tumors: Secondary | ICD-10-CM | POA: Insufficient documentation

## 2023-02-04 DIAGNOSIS — I700 Atherosclerosis of aorta: Secondary | ICD-10-CM | POA: Diagnosis not present

## 2023-02-04 DIAGNOSIS — K760 Fatty (change of) liver, not elsewhere classified: Secondary | ICD-10-CM | POA: Diagnosis not present

## 2023-02-04 DIAGNOSIS — R911 Solitary pulmonary nodule: Secondary | ICD-10-CM | POA: Diagnosis not present

## 2023-02-04 DIAGNOSIS — I2510 Atherosclerotic heart disease of native coronary artery without angina pectoris: Secondary | ICD-10-CM | POA: Diagnosis not present

## 2023-02-04 MED ORDER — COPPER CU 64 DOTATATE 1 MCI/ML IV SOLN
4.0000 | Freq: Once | INTRAVENOUS | Status: AC
  Administered 2023-02-04: 4.29 via INTRAVENOUS

## 2023-02-11 ENCOUNTER — Other Ambulatory Visit: Payer: Self-pay

## 2023-02-11 ENCOUNTER — Telehealth: Payer: Self-pay

## 2023-02-11 DIAGNOSIS — R911 Solitary pulmonary nodule: Secondary | ICD-10-CM

## 2023-02-11 NOTE — Telephone Encounter (Signed)
-----   Message from Earlie Server, MD sent at 02/07/2023  9:13 PM EDT ----- Please let patient know that Dotatate PET scan showed no metastatic or recurrent neuroendocrine cancer.  He has a nodule in his right lung, slightly increased in size. I recommend him to repeat CT chest wo contrast in 3 months. Please arrange.

## 2023-02-11 NOTE — Telephone Encounter (Signed)
Sergio Callahan can you schedule patient for CT Scan in 3 months and notify patient of appointment date and time. I have call patient and notified him of PET Scan results and that we will be scheduling CT Scan.

## 2023-02-13 ENCOUNTER — Ambulatory Visit: Payer: Medicare Other | Admitting: Urology

## 2023-02-14 ENCOUNTER — Ambulatory Visit (INDEPENDENT_AMBULATORY_CARE_PROVIDER_SITE_OTHER): Payer: Medicare Other | Admitting: Urology

## 2023-02-14 VITALS — BP 121/82 | HR 82 | Ht 72.0 in | Wt 195.0 lb

## 2023-02-14 DIAGNOSIS — N138 Other obstructive and reflux uropathy: Secondary | ICD-10-CM

## 2023-02-14 DIAGNOSIS — Z125 Encounter for screening for malignant neoplasm of prostate: Secondary | ICD-10-CM

## 2023-02-14 DIAGNOSIS — N529 Male erectile dysfunction, unspecified: Secondary | ICD-10-CM | POA: Diagnosis not present

## 2023-02-14 DIAGNOSIS — R350 Frequency of micturition: Secondary | ICD-10-CM

## 2023-02-14 DIAGNOSIS — N401 Enlarged prostate with lower urinary tract symptoms: Secondary | ICD-10-CM | POA: Diagnosis not present

## 2023-02-14 LAB — BLADDER SCAN AMB NON-IMAGING

## 2023-02-14 MED ORDER — TAMSULOSIN HCL 0.4 MG PO CAPS: 0.4000 mg | ORAL_CAPSULE | Freq: Every day | ORAL | 11 refills | Status: DC

## 2023-02-14 MED ORDER — TADALAFIL 20 MG PO TABS: 20.0000 mg | ORAL_TABLET | Freq: Every day | ORAL | 6 refills | Status: DC | PRN

## 2023-02-14 MED ORDER — FINASTERIDE 5 MG PO TABS: 5.0000 mg | ORAL_TABLET | Freq: Every day | ORAL | 3 refills | Status: DC

## 2023-02-14 NOTE — Progress Notes (Signed)
   02/14/2023 11:25 AM   Sergio Callahan [birth year] [id number]  Reason for visit: Follow up BPH, microscopic hematuria, history of penile warts, ED, PSA screening  HPI: 72 year old male here for follow-up of the above issues.  He had a microscopic hematuria work-up in March 2022 with a normal renal ultrasound, as well as a normal cystoscopy with moderate size prostate and obstructing lateral lobes, normal-appearing bladder with mild trabeculations.  Prostate measured 50 g on CT from 2019.  PSA was normal in January 2023 at 0.9 urinalysis in February 2023 showed mild persistent microscopic hematuria with 4-10 RBCs.  He also was started on finasteride in the summer 2022 by his PCP, he is not sure if this has significantly changed his urinary symptoms.    Denies any major change in his urinary symptoms over the last year on maximal medical therapy with Flomax and finasteride.  We had previously discussed UroLift, but he is not bothered enough with his symptoms to consider that at this time.  IPSS score today is 8, with quality of life mostly satisfied, PVR normal at 65ml.  He has nocturia 2-3 times at night, and we reviewed behavioral strategies including avoiding bladder irritants prior to bed, minimizing fluids in the evening, and voiding prior to bedtime.  He continues on Cialis 20 mg on demand for ED with good results.  He also has a history of penile warts, these have been treated with dermatology and [facility].  PSA from February 2024 normal at 0.57, corrected for finasteride 1.2.  Can consider discontinuing screening per guideline recommendations with his age, as well as other comorbidities including neuroendocrine gastric tumor managed by oncology.  Cialis 20 mg on demand for ED refilled Flomax and finasteride refilled RTC 1 year IPSS, PVR   [name], MD  [facility] [address] [place], [place] [address] [phone]

## 2023-04-22 ENCOUNTER — Emergency Department
Admission: EM | Admit: 2023-04-22 | Discharge: 2023-04-22 | Disposition: A | Discharge status: Home / Self Care | Payer: Medicare Other | Attending: Emergency Medicine | Admitting: Emergency Medicine

## 2023-04-22 ENCOUNTER — Emergency Department: Payer: Medicare Other

## 2023-04-22 ENCOUNTER — Other Ambulatory Visit: Payer: Self-pay

## 2023-04-22 DIAGNOSIS — I739 Peripheral vascular disease, unspecified: Secondary | ICD-10-CM | POA: Diagnosis not present

## 2023-04-22 DIAGNOSIS — I4891 Unspecified atrial fibrillation: Secondary | ICD-10-CM | POA: Diagnosis not present

## 2023-04-22 DIAGNOSIS — I2510 Atherosclerotic heart disease of native coronary artery without angina pectoris: Secondary | ICD-10-CM | POA: Insufficient documentation

## 2023-04-22 DIAGNOSIS — R000 Tachycardia, unspecified: Secondary | ICD-10-CM | POA: Diagnosis not present

## 2023-04-22 DIAGNOSIS — I10 Essential (primary) hypertension: Secondary | ICD-10-CM | POA: Insufficient documentation

## 2023-04-22 DIAGNOSIS — R55 Syncope and collapse: Secondary | ICD-10-CM

## 2023-04-22 DIAGNOSIS — I959 Hypotension, unspecified: Secondary | ICD-10-CM | POA: Diagnosis not present

## 2023-04-22 DIAGNOSIS — R008 Other abnormalities of heart beat: Secondary | ICD-10-CM | POA: Insufficient documentation

## 2023-04-22 DIAGNOSIS — N400 Enlarged prostate without lower urinary tract symptoms: Secondary | ICD-10-CM | POA: Insufficient documentation

## 2023-04-22 DIAGNOSIS — R42 Dizziness and giddiness: Secondary | ICD-10-CM | POA: Diagnosis present

## 2023-04-22 LAB — URINALYSIS, ROUTINE W REFLEX MICROSCOPIC
Bilirubin Urine: NEGATIVE
Glucose, UA: NEGATIVE mg/dL
Hgb urine dipstick: NEGATIVE
Ketones, ur: NEGATIVE mg/dL
Leukocytes,Ua: NEGATIVE
Nitrite: NEGATIVE
Protein, ur: 30 mg/dL — AB
Specific Gravity, Urine: 1.02 (ref 1.005–1.030)
Squamous Epithelial / HPF: NONE SEEN /HPF (ref 0–5)
pH: 6 (ref 5.0–8.0)

## 2023-04-22 LAB — CBC
HCT: 46.2 % (ref 39.0–52.0)
Hemoglobin: 15.5 g/dL (ref 13.0–17.0)
MCH: 32.3 pg (ref 26.0–34.0)
MCHC: 33.5 g/dL (ref 30.0–36.0)
MCV: 96.3 fL (ref 80.0–100.0)
Platelets: 251 10*3/uL (ref 150–400)
RBC: 4.8 MIL/uL (ref 4.22–5.81)
RDW: 13.1 % (ref 11.5–15.5)
WBC: 6.1 10*3/uL (ref 4.0–10.5)
nRBC: 0 % (ref 0.0–0.2)

## 2023-04-22 LAB — BASIC METABOLIC PANEL
Anion gap: 13 (ref 5–15)
BUN: 18 mg/dL (ref 8–23)
CO2: 24 mmol/L (ref 22–32)
Calcium: 9.1 mg/dL (ref 8.9–10.3)
Chloride: 98 mmol/L (ref 98–111)
Creatinine, Ser: 1.13 mg/dL (ref 0.61–1.24)
GFR, Estimated: >60 mL/min (ref >60)
Glucose, Bld: 107 mg/dL — ABNORMAL HIGH (ref 70–99)
Potassium: 3.5 mmol/L (ref 3.5–5.1)
Sodium: 135 mmol/L (ref 135–145)

## 2023-04-22 LAB — TROPONIN I (HIGH SENSITIVITY)
Troponin I (High Sensitivity): 8 ng/L (ref <18)
Troponin I (High Sensitivity): 8 ng/L (ref <18)

## 2023-04-22 LAB — BRAIN NATRIURETIC PEPTIDE: B Natriuretic Peptide: 52 pg/mL (ref 0.0–100.0)

## 2023-04-22 MED ORDER — SODIUM CHLORIDE 0.9 % IV BOLUS
1000.0000 mL | Freq: Once | INTRAVENOUS | Status: AC
  Administered 2023-04-22: 1000 mL via INTRAVENOUS

## 2023-04-22 NOTE — ED Provider Triage Note (Signed)
Emergency Medicine Provider Triage Evaluation Note  CRYSTIAN FRITH , a 72 y.o. male  was evaluated in triage.  Pt complains of dizziness for about a week. Lower BP than normal at home over the past several days. No chest pain or shortness of breath.   Physical Exam  BP (!) 119/95   Pulse 100   Temp 98.2 F (36.8 C)   Resp 16   Ht 6' (1.829 m)   Wt 90.7 kg   SpO2 96%   BMI 27.12 kg/m  Gen:   Awake, no distress   Resp:  Normal effort  MSK:   Moves extremities without difficulty  Other:    Medical Decision Making  Medically screening exam initiated at 3:22 PM.  Appropriate orders placed.  Hortencia Conradi was informed that the remainder of the evaluation will be completed by another provider, this initial triage assessment does not replace that evaluation, and the importance of remaining in the ED until their evaluation is complete.  Cardiac protocol started.   Chinita Pester, FNP 04/22/23 1524

## 2023-04-22 NOTE — ED Provider Notes (Signed)
[facility] Provider Note    Event Date/Time   First MD Initiated Contact with Patient 04/22/23 1711     (approximate)   History   Dizziness (Low BP over the weekend, 80/50) and Weakness   HPI  Sergio Callahan is a 72 y.o. male with a history of CAD, AAA, hypertension, lupus, anemia, GERD, and PVD who presents with multiple episodes of lightheadedness over the last several days.  The patient states that he will feel weak and lightheaded but not any bending or vertigo symptoms.  He checked his blood pressure multiple times and noted that it was low when he felt this way, as low as the 70s over 30s.  He states other times he will check it and will be normal.  He went to the fire department today and got a couple of normal blood pressure readings but then also low reading.  EMS came over and thought that his EKG might show atrial fibrillation so they had him come to the hospital.  The patient denies any palpitations, chest pain, difficulty breathing, headache, fever, or vomiting.  He is on antihypertensives but did not take them over the last several days due to low blood pressure readings.  I reviewed the past medical records.  The patient's most recent outpatient encounter was with Dr. Richardo Hanks from urology on 4/4 for follow-up of BPH and hematuria.   Physical Exam   Triage Vital Signs: ED Triage Vitals  Enc Vitals Group     BP 04/22/23 1514 (!) 119/95     Pulse Rate 04/22/23 1514 100     Resp 04/22/23 1514 16     Temp 04/22/23 1514 98.2 F (36.8 C)     Temp src --      SpO2 04/22/23 1514 96 %     Weight 04/22/23 1511 200 lb (90.7 kg)     Height 04/22/23 1511 6' (1.829 m)     Head Circumference --      Peak Flow --      Pain Score 04/22/23 1514 0     Pain Loc --      Pain Edu? --      Excl. in GC? --     Most recent vital signs: Vitals:   04/22/23 2030 04/22/23 2100  BP: (!) 156/69 133/72  Pulse: 74 85  Resp: 16 17  Temp:  98 F (36.7 C)  SpO2:  96% 95%    General: Alert, well-appearing, no distress.   CV:  Good peripheral perfusion. Normal heart sounds. Resp:  Normal effort.  Lungs CTAB. Abd:  No distention.  Other:  Motor intact in all extremities.  Moist mucous membranes.   ED Results / Procedures / Treatments   Labs (all labs ordered are listed, but only abnormal results are displayed) Labs Reviewed  BASIC METABOLIC PANEL - Abnormal; Notable for the following components:      Result Value   Glucose, Bld 107 (*)    All other components within normal limits  URINALYSIS, ROUTINE W REFLEX MICROSCOPIC - Abnormal; Notable for the following components:   Color, Urine AMBER (*)    APPearance HAZY (*)    Protein, ur 30 (*)    Bacteria, UA RARE (*)    All other components within normal limits  CBC  BRAIN NATRIURETIC PEPTIDE  CBG MONITORING, ED  TROPONIN I (HIGH SENSITIVITY)  TROPONIN I (HIGH SENSITIVITY)     EKG  ED ECG REPORT I, Dionne Bucy, the attending  physician, personally viewed and interpreted this ECG.  Date: 04/22/2023 EKG Time: 1513 Rate: 116 Rhythm: Undetermined rhythm, likely sinus tachycardia QRS Axis: normal Intervals: normal ST/T Wave abnormalities: Nonspecific T wave abnormalities Narrative Interpretation: no evidence of acute ischemia   ED ECG REPORT I, Dionne Bucy, the attending physician, personally viewed and interpreted this ECG.  Date: 04/22/2023 EKG Time: 1721 Rate: 91 Rhythm: normal sinus rhythm with PACs QRS Axis: normal Intervals: normal ST/T Wave abnormalities: normal Narrative Interpretation: no evidence of acute ischemia    RADIOLOGY  Chest x-ray: I independently viewed and interpreted the images; there is no focal consolidation or edema   PROCEDURES:  Critical Care performed: No  Procedures   MEDICATIONS ORDERED IN ED: Medications  sodium chloride 0.9 % bolus 1,000 mL (0 mLs Intravenous Stopped 04/22/23 1837)     IMPRESSION / MDM / ASSESSMENT  AND PLAN / ED COURSE  I reviewed the triage vital signs and the nursing notes.  72 year old male with PMH as noted above presents with recurrent episodes of hypotension over the last week associated with lightheadedness.  He denies other focal acute symptoms.  On exam here his blood pressure is actually elevated.  His other vital signs are normal.  Exam is unremarkable.  Initial EKG showed possible atrial fibrillation although this is likely due to noise, as repeat shows clear sinus beats with P waves although the rate is somewhat irregular.  Differential diagnosis includes, but is not limited to, dehydration/hypovolemia, metabolic cause, paroxysmal atrial fibrillation or other dysrhythmia, UTI or other infection.  We will check orthostatic vital signs, give a fluid bolus, obtain lab workup, and reassess.  Patient's presentation is most consistent with acute presentation with potential threat to life or bodily function.  The patient is on the cardiac monitor to evaluate for evidence of arrhythmia and/or significant heart rate changes.   ----------------------------------------- 8:33 PM on 04/22/2023 -----------------------------------------  The patient was not significantly orthostatic.  Blood pressure did drop slightly with standing although he did not become hypotensive or tachycardic.  He has not had any recurrent episodes of hypotension or dizziness while in the ED for more than 6 hours.  Lab workup is overall reassuring with negative troponin, some RBCs but no other significant findings on UA, normal electrolytes, and no leukocytosis or anemia.  At this time there is no evidence of acute infection/sepsis, significant hypovolemia, or cardiac etiology.  I discussed the results of the workup with the patient.  I did consider whether he may benefit from inpatient admission for further workup given the hypotension earlier.  However, the patient expresses a strong desire to go home unless  absolutely necessary and I feel that discharge is reasonable given the stable vital signs in the ED and the negative workup.  He will follow-up with his cardiologist and primary care provider.  I gave strict return precautions and he expressed understanding.   FINAL CLINICAL IMPRESSION(S) / ED DIAGNOSES   Final diagnoses:  Hypotension, unspecified hypotension type  Near syncope     Rx / DC Orders   ED Discharge Orders     None        Note:  This document was prepared using Dragon voice recognition software and may include unintentional dictation errors.    Dionne Bucy, MD 04/22/23 2333

## 2023-04-22 NOTE — ED Notes (Signed)
Pt came in due to being lightheaded/dizzy. Pt states BP was in low BP range and normally has high BP that he takes medicine for. Went to the fire department to get checked out and BP for them was low range then high range then low again. Pt states he does have a heart stent as well.

## 2023-04-22 NOTE — Discharge Instructions (Signed)
Follow-up with Dr. Juliann Pares as well as with your primary care provider.  Monitor your blood pressure over the next several days.  Start taking your blood pressure medications again as discussed.  Return to the ER immediately for new, worsening, or recurrent episodes of low blood pressure, weakness or dizziness, chest pain, difficulty breathing, or any other new or worsening symptoms that concern you.

## 2023-04-22 NOTE — ED Triage Notes (Signed)
Arrives from home via [facility]>  C/O dizziness x 1 week.  Has been checking BP at home and SBP at home 80.  Per report, automatic BP 90/60-- when checked manually, BP: 160/70 sitting.  EKG ST with PAC - rate 110.

## 2023-05-13 ENCOUNTER — Ambulatory Visit
Admission: RE | Admit: 2023-05-13 | Discharge: 2023-05-13 | Disposition: A | Discharge status: Home / Self Care | Payer: Medicare Other | Source: Ambulatory Visit | Attending: Oncology | Admitting: Oncology

## 2023-05-13 DIAGNOSIS — R911 Solitary pulmonary nodule: Secondary | ICD-10-CM | POA: Insufficient documentation

## 2023-05-13 DIAGNOSIS — R748 Abnormal levels of other serum enzymes: Secondary | ICD-10-CM

## 2023-05-13 HISTORY — DX: Abnormal levels of other serum enzymes: R74.8

## 2023-05-23 ENCOUNTER — Telehealth: Payer: Self-pay

## 2023-05-23 NOTE — Telephone Encounter (Signed)
-----   Message from Rickard Patience sent at 05/22/2023  9:53 PM EDT ----- CT showed increased lung nodule. I need to have a discussion with him. Please arrange non urgent MD appt thanks.

## 2023-05-23 NOTE — Telephone Encounter (Signed)
Please schedule pt for MD only- Non urgent appt. For CT results discussion.

## 2023-06-03 ENCOUNTER — Encounter: Payer: Self-pay | Admitting: Oncology

## 2023-06-03 ENCOUNTER — Inpatient Hospital Stay: Payer: Medicare Other | Attending: Oncology | Admitting: Oncology

## 2023-06-03 ENCOUNTER — Other Ambulatory Visit: Payer: Self-pay

## 2023-06-03 VITALS — BP 112/84 | HR 93 | Temp 97.4°F | Resp 18 | Wt 196.1 lb

## 2023-06-03 DIAGNOSIS — Z79624 Long term (current) use of inhibitors of nucleotide synthesis: Secondary | ICD-10-CM | POA: Diagnosis not present

## 2023-06-03 DIAGNOSIS — Z800 Family history of malignant neoplasm of digestive organs: Secondary | ICD-10-CM | POA: Insufficient documentation

## 2023-06-03 DIAGNOSIS — Z801 Family history of malignant neoplasm of trachea, bronchus and lung: Secondary | ICD-10-CM | POA: Diagnosis not present

## 2023-06-03 DIAGNOSIS — Z1509 Genetic susceptibility to other malignant neoplasm: Secondary | ICD-10-CM

## 2023-06-03 DIAGNOSIS — K921 Melena: Secondary | ICD-10-CM | POA: Diagnosis not present

## 2023-06-03 DIAGNOSIS — Z86010 Personal history of colonic polyps: Secondary | ICD-10-CM | POA: Insufficient documentation

## 2023-06-03 DIAGNOSIS — Z79899 Other long term (current) drug therapy: Secondary | ICD-10-CM | POA: Diagnosis not present

## 2023-06-03 DIAGNOSIS — R911 Solitary pulmonary nodule: Secondary | ICD-10-CM | POA: Diagnosis not present

## 2023-06-03 DIAGNOSIS — C7A8 Other malignant neuroendocrine tumors: Secondary | ICD-10-CM | POA: Diagnosis not present

## 2023-06-03 DIAGNOSIS — I714 Abdominal aortic aneurysm, without rupture, unspecified: Secondary | ICD-10-CM | POA: Diagnosis not present

## 2023-06-03 DIAGNOSIS — I10 Essential (primary) hypertension: Secondary | ICD-10-CM | POA: Diagnosis not present

## 2023-06-03 DIAGNOSIS — Z7982 Long term (current) use of aspirin: Secondary | ICD-10-CM | POA: Diagnosis not present

## 2023-06-03 DIAGNOSIS — C169 Malignant neoplasm of stomach, unspecified: Secondary | ICD-10-CM | POA: Insufficient documentation

## 2023-06-03 DIAGNOSIS — K317 Polyp of stomach and duodenum: Secondary | ICD-10-CM | POA: Insufficient documentation

## 2023-06-03 DIAGNOSIS — K219 Gastro-esophageal reflux disease without esophagitis: Secondary | ICD-10-CM | POA: Insufficient documentation

## 2023-06-03 DIAGNOSIS — D472 Monoclonal gammopathy: Secondary | ICD-10-CM | POA: Insufficient documentation

## 2023-06-03 DIAGNOSIS — Z87891 Personal history of nicotine dependence: Secondary | ICD-10-CM | POA: Insufficient documentation

## 2023-06-03 DIAGNOSIS — Z79631 Long term (current) use of antimetabolite agent: Secondary | ICD-10-CM | POA: Diagnosis not present

## 2023-06-03 DIAGNOSIS — E785 Hyperlipidemia, unspecified: Secondary | ICD-10-CM | POA: Diagnosis not present

## 2023-06-03 DIAGNOSIS — Z1589 Genetic susceptibility to other disease: Secondary | ICD-10-CM

## 2023-06-03 NOTE — Assessment & Plan Note (Signed)
Discussed with patient about lifestyle modification, avoiding caffeine, spicy food, alcohol, healthy weight loss etc.

## 2023-06-03 NOTE — Progress Notes (Signed)
Hematology/Oncology Progress note [phone] [phone]) [phone]     CHIEF COMPLAINTS/REASON FOR VISIT:  gastric neuroendocrine carcinoma, lung nodule.    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 Patient is on chronic PPI with initial high gastrin level.  Multiple gastric polyps.   Previously discussed with pathologist Dr. 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 Tumors arising in the setting of chronic PPI use appear to behave in a less malignant fashion than true-sporadic tumors. Recommend patient to continue EGD surveillance. Labs reviewed and discussed with patient.  Chronically elevated chromogranin A, assumed to be due to PPI.  02/06/2023 Dotatate PET -no recurrence, slow growing lung nodule.   Monoclonal gammopathy Stable M protein and light chain ratio.  Continue observation.  Repeat in 1 year.  Monoallelic mutation of MITF gene Patient had discussion with genetic counselor. 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 Recommend regular self skin examination, application of sunscreen, follow-up with dermatologist annually.    GERD (gastroesophageal reflux disease) Discussed with patient about lifestyle modification, avoiding caffeine, spicy food, alcohol, healthy weight loss etc.  Lung nodule Slowing growing in size since 2022.  Recommend biopsy Discussed case with Dr. Aundria Rud, will refer to pulmonology for biopsy via bronchoscopy   No orders of the defined types were placed in this  encounter.  Follow-up TBD All questions were answered. The patient knows to call the clinic with any problems, questions or concerns.  Rickard Patience, MD, PhD [facility] Hematology Oncology 06/03/2023   HISTORY OF PRESENTING ILLNESS:   Sergio Callahan is a  72 y.o.  male with PMH listed below was seen in consultation at the request of  Gracelyn Nurse, MD  for evaluation of gastric neuroendocrine carcinoma Patient was found to have positive occult blood in the stool.  Was referred to establish with gastroenterology. 05/05/2021, patient underwent EGD and colonoscopy by Dr. Mia Creek. EGD showed gastritis, erythematous mucosa in the gastric fundus, multiple fundic gland polyps, resected and retrieved. Colonoscopy showed multiple colon polyps, resected and retrieved.  Diverticulosis.  Erythematous mucosa in the sigmoid colon.  CASE: [id number]  PATIENT: Sergio Callahan  Surgical Pathology Report  Specimen Submitted:  A. Stomach; cbx  B. Stomach polyp; cbx  C. Stomach, abnormal mucosa; cbx  D. Stomach polyp, inflammatory; cold snare  E. Colon polyp, rectosigmoid; cbx  F. Colon polyp, cecum; cbx  G. Colon polyp x2, transverse; cold snare (1) and cbx (1)  H. Colon, sigmoid, abnormal mucosa; cbx  I. Colon polyp, sigmoid; cbx   Clinical History: GERD, heme positive. Findings: Gastric polyps,  gastritis, colon polyps    DIAGNOSIS:  A.  STOMACH; COLD BIOPSY:  - ANTRAL-TYPE MUCOSA WITH REACTIVE FOVEOLAR HYPERPLASIA.  - OXYNTIC MUCOSA WITH PROTON PUMP INHIBITOR EFFECT.  - NEGATIVE FOR H. PYLORI, INTESTINAL METAPLASIA, DYSPLASIA, AND  MALIGNANCY.   B.  STOMACH POLYP; COLD BIOPSY:  - FUNDIC GLAND POLYP.  - NEGATIVE FOR DYSPLASIA AND MALIGNANCY.   C.  STOMACH, ABNORMAL MUCOSA; COLD BIOPSY:  - OXYNTIC-TYPE MUCOSA WITH REACTIVE FOVEOLAR HYPERPLASIA, HEMORRHAGE,  AND STROMAL CHANGES SUGGESTIVE OF HEALED MUCOSAL INJURY.  - NEGATIVE FOR  INTESTINAL METAPLASIA, DYSPLASIA, AND MALIGNANCY.   D.   STOMACH POLYP, "INFLAMMATORY"; COLD SNARE:  - WELL-DIFFERENTIATED NEUROENDOCRINE TUMOR (NET G2), AT LEAST 5 MM IN  SIZE, SEE COMMENT.  - TUMOR INVOLVES THE MUSCULARIS MUCOSAE AND EXTENDS TO THE INKED DEEP  MARGIN.   E.  COLON POLYP, RECTOSIGMOID; COLD BIOPSY:  - HYPERPLASTIC POLYP.  - NEGATIVE FOR DYSPLASIA AND MALIGNANCY.   F.  COLON POLYP, CECUM; COLD BIOPSY:  - TUBULAR ADENOMA.  - NEGATIVE FOR HIGH-GRADE DYSPLASIA AND MALIGNANCY.   G.  COLON POLYP X 2; COLD SNARE (1) AND COLD BIOPSY (1):  - TUBULAR ADENOMA.  - NEGATIVE FOR HIGH-GRADE DYSPLASIA AND MALIGNANCY.  - ONLY 1 PIECE OF TISSUE FOUND IN THE CONTAINER.   H.  SIGMOID COLON, ABNORMAL MUCOSA; COLD BIOPSY:  - COLONIC MUCOSA WITH INTACT CRYPT ARCHITECTURE.  - LYMPHOID AGGREGATES AND CONGESTION.  - NEGATIVE FOR DYSPLASIA AND MALIGNANCY.   I.  COLON POLYP, SIGMOID; COLD BIOPSY:  - HYPERPLASTIC POLYP.  - NEGATIVE FOR DYSPLASIA AND MALIGNANCY.   Patient denies any stomach discomfort today. He is on omeprazole 20 mg daily chronically.  He drinks alcohol on some days. Lupus, follows up with Dr. Gavin Potters.  He is on Imuran and Plaquenil Denies any unintentional weight loss, night sweats, diarrhea, facial flushing, fever.   Gastrin level was initially elevated on  with a level of 759, repeat after holding PPI for 2 weeks on 07/10/2021 showed a normalized level of 76.   06/20/2021 Dotatate PET scan showed no focal radial tracer activity within the stomach to localize well differentiated neuroendocrine tumor.  Diffuse physiological activity throughout the stomach.  No evidence of well-differentiated metastatic neuroendocrine tumor or adenopathy.  08/28/2021, patient underwent EUS by Dr.Mansouraty FINAL MICROSCOPIC DIAGNOSIS:  A. STOMACH, BODY, POLYPECTOMY:  - Fundic gland polyp  - Negative for dysplasia  B. STOMACH, ANTRUM, BIOPSY:  - Gastric antral mucosa with mild chronic gastritis  - Warthin Starry stain is negative for  Helicobacter pylori  C. STOMACH, INCISURA, BIOPSY:  - Gastric oxyntic mucosa with parietal cell hyperplasia as can be seen  in hypergastrinemic states such as PPI therapy.  - Negative for atrophy or intestinal metaplasia  D. STOMACH, GREATER CURVE, BIOPSY:  - Gastric oxyntic mucosa with parietal cell hyperplasia as can be seen  in hypergastrinemic states such as PPI therapy.  - Negative for atrophy or intestinal metaplasia  E. STOMACH, LESSER CURVE, BIOPSY:  - Gastric oxyntic mucosa with parietal cell hyperplasia as can be seen  in hypergastrinemic states such as PPI therapy.  - Negative for atrophy or intestinal metaplasia  F. STOMACH, CARDIA AND FUNDUS, BIOPSY:  - Gastric oxyntic mucosa with parietal cell hyperplasia as can be seen  in hypergastrinemic states such as PPI therapy.  - Negative for atrophy or intestinal metaplasia  For gynecomastia work-up.  Patient underwent bilateral diagnostic mammogram on 06/12/2021.  Benign left greater than right gynecomastia.  Normal right axillary ultrasound. Patient underwent left breast vacuum-assisted core needle biopsy recommended by Dr. Lemar Livings, pathology showed benign mammary parenchyma with gynecomastia.  Negative for atypical proliferation breast disease.  INTERVAL HISTORY Sergio Callahan is a 72 y.o. male who has above history reviewed by me today presents for follow up visit for gastric neuroendocrine carcinoma, MGUS.  For neuroendocrine carcinoma, patient follows with gastroenterology and is on  EGD every surveillance.  Last EGD was on 08/17/2022   He has no new complaints.  Continues to take PPI for acid reflux.  He forgets to hold  PPI prior to his blood work.    No cough, chest pain.  Extensive smoking history, quitted 1 year ago.   Review of Systems  Constitutional:  Negative for appetite change, chills, fatigue, fever and unexpected weight change.  HENT:   Negative for hearing loss and voice change.   Eyes:  Negative for eye problems  and icterus.  Respiratory:  Negative for chest tightness, cough and shortness of breath.   Cardiovascular:  Negative for chest pain and leg swelling.  Gastrointestinal:  Negative for abdominal distention and abdominal pain.  Endocrine: Negative for hot flashes.  Genitourinary:  Negative for difficulty urinating, dysuria and frequency.   Musculoskeletal:  Negative for arthralgias.  Skin:  Negative for itching and rash.  Neurological:  Negative for light-headedness and numbness.  Hematological:  Negative for adenopathy. Does not bruise/bleed easily.  Psychiatric/Behavioral:  Negative for confusion.     MEDICAL HISTORY:  Past Medical History:  Diagnosis Date   Abdominal aortic aneurysm without rupture (HCC)    Anemia    takes B12 injections monthly   Anogenital (venereal) warts    Anxiety    takes Xanax daily as needed   Arthritis    back    Back pain    synovial cyst   Carpal tunnel syndrome    Celiac artery atherosclerosis    Collagen vascular disease (HCC)    Hx Lupus.   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    Hx of adenomatous colonic polyps    Hyperlipidemia    takes Pravastatin nightly   Hypertension    takes Lisinopril-HCTZ and Metoprolol daily   Hyponatremia    Iliac artery aneurysm (HCC)    Lupus (HCC)    takes Plaquenil and Imran daily   Nocturia    Numbness and tingling    left hip/leg/foot   Peripheral vascular disease (HCC)    Peripheral vascular disease (HCC)    Rotator cuff tendonitis    Urinary frequency     SURGICAL HISTORY: Past Surgical History:  Procedure Laterality Date   BACK SURGERY     BIOPSY  08/28/2021   Procedure: BIOPSY;  Surgeon: Meridee Score, Netty Starring., MD;   Location: [facility];  Service: Gastroenterology;;   BROW LIFT Bilateral 01/27/2021   Procedure: BLEPHAROPLASTY UPPER EYELID; W/EXCESS SKIN BROW PTOSIS REPAIR AND BLEPHAROPTOSIS REPAIR; RESECT EX BILATERAL;  Surgeon: Imagene Riches, MD;  Location: [facility];  Service: Ophthalmology;  Laterality: Bilateral;   CARDIAC CATHETERIZATION  2012   COLONOSCOPY     COLONOSCOPY WITH PROPOFOL N/A 09/30/2019   Procedure: COLONOSCOPY WITH PROPOFOL;  Surgeon: Earline Mayotte, MD;  Location: [facility];  Service: Endoscopy;  Laterality: N/A;   COLONOSCOPY WITH PROPOFOL N/A 05/05/2021   Procedure: COLONOSCOPY WITH PROPOFOL;  Surgeon: Regis Bill, MD;  Location: [facility];  Service: Endoscopy;  Laterality: N/A;   CORONARY ANGIOPLASTY     1 stent   ESOPHAGOGASTRODUODENOSCOPY     ESOPHAGOGASTRODUODENOSCOPY (EGD) WITH PROPOFOL N/A 05/05/2021   Procedure: ESOPHAGOGASTRODUODENOSCOPY (EGD) WITH PROPOFOL;  Surgeon: Regis Bill, MD;  Location: [facility];  Service: Endoscopy;  Laterality: N/A;   ESOPHAGOGASTRODUODENOSCOPY (EGD) WITH PROPOFOL N/A  08/28/2021   Procedure: ESOPHAGOGASTRODUODENOSCOPY (EGD) WITH PROPOFOL;  Surgeon: Meridee Score Netty Starring., MD;  Location: [facility];  Service: Gastroenterology;  Laterality: N/A;   ESOPHAGOGASTRODUODENOSCOPY (EGD) WITH PROPOFOL N/A 08/17/2022   Procedure: ESOPHAGOGASTRODUODENOSCOPY (EGD) WITH PROPOFOL;  Surgeon: Regis Bill, MD;  Location: [facility];  Service: Endoscopy;  Laterality: N/A;   EUS N/A 08/28/2021   Procedure: UPPER ENDOSCOPIC ULTRASOUND (EUS) RADIAL;  Surgeon: Lemar Lofty., MD;  Location: [facility];  Service: Gastroenterology;  Laterality: N/A;   LUMBAR LAMINECTOMY/DECOMPRESSION MICRODISCECTOMY Left 02/03/2015   Procedure: LUMBAR LAMINECTOMY/DECOMPRESSION MICRODISCECTOMY LEFT LUMBAR FOUR-FIVE WITH REMOVAL OF SYNOVIAL CYST;  Surgeon: Tressie Stalker, MD;  Location: [facility];  Service:  Neurosurgery;  Laterality: Left;   NASAL SEPTOPLASTY W/ TURBINOPLASTY Bilateral 10/23/2019   Procedure: NASAL SEPTOPLASTY WITH SUBMUCOUS RESECTION OF TURBINATE;  Surgeon: Linus Salmons, MD;  Location: [facility];  Service: ENT;  Laterality: Bilateral;   PELVIC ANGIOGRAPHY N/A 05/02/2018   Procedure: PELVIC ANGIOGRAPHY;  Surgeon: Renford Dills, MD;  Location: [facility];  Service: Cardiovascular;  Laterality: N/A;   POLYPECTOMY  08/28/2021   Procedure: POLYPECTOMY;  Surgeon: Meridee Score Netty Starring., MD;  Location: [facility];  Service: Gastroenterology;;   SHOULDER ARTHROSCOPY WITH SUBACROMIAL DECOMPRESSION AND BICEP TENDON REPAIR Right 12/04/2018   Procedure: SHOULDER ARTHROSCOPY WITH DEBRIDEMENT, DECOMPRESSION, BICEP TENODESIS;  Surgeon: Christena Flake, MD;  Location: [facility];  Service: Orthopedics;  Laterality: Right;   SUBMUCOSAL INJECTION  08/28/2021   Procedure: SUBMUCOSAL INJECTION;  Surgeon: Meridee Score, Netty Starring., MD;  Location: [facility];  Service: Gastroenterology;;    SOCIAL HISTORY: Social History   Socioeconomic History   Marital status: Married    Spouse name: Not on file   Number of children: Not on file   Years of education: Not on file   Highest education level: Not on file  Occupational History   Not on file  Tobacco Use   Smoking status: Former    Current packs/day: 0.00    Average packs/day: 1.5 packs/day for 30.0 years (45.0 ttl pk-yrs)    Types: Cigarettes    Start date: 04/01/1991    Quit date: 03/31/2021    Years since quitting: 2.1   Smokeless tobacco: Never  Vaping Use   Vaping status: Never Used  Substance and Sexual Activity   Alcohol use: Yes    Comment: cocktails about 2 daily   Drug use: No   Sexual activity: Yes  Other Topics Concern   Not on file  Social History Narrative   Not on file   Social Determinants of Health   Financial Resource Strain: Not on file  Food Insecurity: Not on file  Transportation  Needs: Not on file  Physical Activity: Not on file  Stress: Not on file  Social Connections: Not on file  Intimate Partner Violence: Not on file    FAMILY HISTORY: Family History  Problem Relation Age of Onset   Leukemia Mother        dx 34s   Hyperlipidemia Father    Hypertension Father    Diabetes Father    Colon cancer Father        dx 71s   Hearing loss Father    Vascular Disease Father    Lung cancer Brother        d. 26    ALLERGIES:  is allergic to fosamax [alendronate sodium] and percocet [oxycodone-acetaminophen].  MEDICATIONS:  Current Outpatient Medications  Medication Sig Dispense Refill   ALPRAZolam (XANAX) 0.5 MG  tablet Take 0.5 mg by mouth 3 (three) times daily as needed for anxiety.      amLODipine (NORVASC) 2.5 MG tablet Take 5 mg by mouth daily.     aspirin EC 81 MG tablet Take 81 mg by mouth daily.     azaTHIOprine (IMURAN) 50 MG tablet Take 125 mg by mouth daily.      cetirizine (ZYRTEC) 10 MG tablet Take 10 mg by mouth daily.     cyanocobalamin (,VITAMIN B-12,) 1000 MCG/ML injection Inject 1,000 mcg into the muscle every 14 (fourteen) days.      ELDERBERRY PO Take 2 tablets by mouth daily.     finasteride (PROSCAR) 5 MG tablet Take 1 tablet (5 mg total) by mouth daily. 90 tablet 3   hydrochlorothiazide (HYDRODIURIL) 12.5 MG tablet Take 12.5 mg by mouth daily.     hydroxychloroquine (PLAQUENIL) 200 MG tablet Take 200 mg by mouth 2 (two) times daily.     ibuprofen (ADVIL,MOTRIN) 200 MG tablet Take 400 mg by mouth every 8 (eight) hours as needed for headache or moderate pain.     metoprolol succinate (TOPROL-XL) 50 MG 24 hr tablet Take 100 mg by mouth at bedtime. Take with or immediately following a meal.     nicotine (NICODERM CQ - DOSED IN MG/24 HOURS) 21 mg/24hr patch Place 21 mg onto the skin daily.     nitroGLYCERIN (NITROLINGUAL) 0.4 MG/SPRAY spray Place 1 spray under the tongue every 5 (five) minutes x 3 doses as needed for chest pain.     omeprazole  (PRILOSEC) 20 MG capsule Take 20 mg by mouth at bedtime.     pravastatin (PRAVACHOL) 40 MG tablet Take 40 mg by mouth at bedtime.     Probiotic Product (PROBIOTIC DAILY PO) Take 1 capsule by mouth daily.     spironolactone (ALDACTONE) 25 MG tablet Take 25 mg by mouth daily.     tadalafil (CIALIS) 20 MG tablet Take 1 tablet (20 mg total) by mouth daily as needed for erectile dysfunction. 30 tablet 6   tamsulosin (FLOMAX) 0.4 MG CAPS capsule Take 1 capsule (0.4 mg total) by mouth daily after breakfast. 30 capsule 11   triamcinolone (NASACORT) 55 MCG/ACT AERO nasal inhaler Place 1 spray into the nose daily as needed (allergies).     No current facility-administered medications for this visit.     PHYSICAL EXAMINATION: ECOG PERFORMANCE STATUS: 0 - Asymptomatic Vitals:   06/03/23 1442  BP: 112/84  Pulse: 93  Resp: 18  Temp: (!) 97.4 F (36.3 C)   Filed Weights   06/03/23 1442  Weight: 196 lb 1.6 oz (89 kg)    Physical Exam Constitutional:      General: He is not in acute distress. HENT:     Head: Normocephalic and atraumatic.  Eyes:     General: No scleral icterus. Cardiovascular:     Rate and Rhythm: Normal rate and regular rhythm.     Heart sounds: Normal heart sounds.  Pulmonary:     Effort: Pulmonary effort is normal. No respiratory distress.     Breath sounds: No wheezing.  Abdominal:     General: Bowel sounds are normal. There is no distension.     Palpations: Abdomen is soft.  Musculoskeletal:        General: No deformity. Normal range of motion.     Cervical back: Normal range of motion and neck supple.  Skin:    General: Skin is warm and dry.     Findings:  No erythema or rash.  Neurological:     Mental Status: He is alert and oriented to person, place, and time. Mental status is at baseline.     Cranial Nerves: No cranial nerve deficit.     Coordination: Coordination normal.  Psychiatric:        Mood and Affect: Mood normal.      LABORATORY DATA:  I  have reviewed the data as listed    Latest Ref Rng & Units 04/22/2023    3:19 PM 01/14/2023    9:38 AM 06/04/2022    1:55 PM  CBC  WBC 4.0 - 10.5 K/uL 6.1  4.9  5.0   Hemoglobin 13.0 - 17.0 g/dL 40.9  81.1  91.4   Hematocrit 39.0 - 52.0 % 46.2  45.3  43.8   Platelets 150 - 400 K/uL 251  232  226       Latest Ref Rng & Units 04/22/2023    3:19 PM 01/14/2023    9:38 AM 06/04/2022    1:55 PM  CMP  Glucose 70 - 99 mg/dL 782  956  213   BUN 8 - 23 mg/dL 18  12  19    Creatinine 0.61 - 1.24 mg/dL 0.86  5.78  4.69   Sodium 135 - 145 mmol/L 135  135  132   Potassium 3.5 - 5.1 mmol/L 3.5  3.7  3.6   Chloride 98 - 111 mmol/L 98  103  105   CO2 22 - 32 mmol/L 24  23  23    Calcium 8.9 - 10.3 mg/dL 9.1  8.9  8.4   Total Protein 6.5 - 8.1 g/dL  7.2  7.3   Total Bilirubin 0.3 - 1.2 mg/dL  0.9  0.9   Alkaline Phos 38 - 126 U/L  73  64   AST 15 - 41 U/L  43  32   ALT 0 - 44 U/L  47  34     RADIOGRAPHIC STUDIES: I have personally reviewed the radiological images as listed and agreed with the findings in the report. CT Chest Wo Contrast  Result Date: 05/15/2023 CLINICAL DATA:  Right lung nodule. Neuroendocrine tumor of the stomach. Former smoker, quit 6 months ago. EXAM: CT CHEST WITHOUT CONTRAST TECHNIQUE: Multidetector CT imaging of the chest was performed following the standard protocol without IV contrast. RADIATION DOSE REDUCTION: This exam was performed according to the departmental dose-optimization program which includes automated exposure control, adjustment of the mA and/or kV according to patient size and/or use of iterative reconstruction technique. COMPARISON:  PET 02/04/2023, CT chest 07/27/2022. FINDINGS: Cardiovascular: Atherosclerotic calcification of the aorta, aortic valve and coronary arteries. Heart is at the upper limits of normal in size. No pericardial effusion. Incidental note is made of lipomatous hypertrophy of the interatrial septum. Mediastinum/Nodes: No pathologically enlarged  mediastinal or axillary lymph nodes. Hilar regions are difficult to definitively evaluate without IV contrast. Esophagus is grossly unremarkable. Lungs/Pleura: Centrilobular emphysema. Pulmonary nodules measure up to 10 mm in the posterior right lower lobe (8 x 11 mm, 3/85), previously 7 mm on 07/27/2022. Scarring in the anterior segment right upper lobe. Minimal smoking related respiratory bronchiolitis. No pleural fluid. Airway is unremarkable. Upper Abdomen: Liver is mildly heterogeneous in attenuation. Visualized portions of the liver and gallbladder are otherwise grossly unremarkable. 2.1 cm right adrenal nodule measures -17 Hounsfield units. No follow-up necessary. 1.6 cm left adrenal nodule measures 9 Hounsfield units. No follow-up necessary. Vague parenchymal calcification in the upper pole right kidney.  Visualized portions of the kidneys, spleen, pancreas, stomach and bowel are otherwise grossly unremarkable. No upper abdominal adenopathy. Musculoskeletal: No worrisome lytic or sclerotic lesions. IMPRESSION: 1. 10 mm posterior right lower lobe nodule has enlarged slightly from baseline 07/27/2022, worrisome for primary bronchogenic carcinoma. Nodule is slightly too small for PET resolution on 02/04/2023. Follow-up CT chest without contrast in 3-6 months is recommended, as clinically indicated. 2. Liver appears steatotic. 3. Bilateral adrenal adenomas. 4. Aortic atherosclerosis (ICD10-I70.0). Coronary artery calcification. 5.  Emphysema (ICD10-J43.9). Electronically Signed   By: Leanna Battles M.D.   On: 05/15/2023 11:10

## 2023-06-03 NOTE — Assessment & Plan Note (Addendum)
Slowing growing in size since 2022 He has an extensive history of smoking,  Recommend biopsy to establish tissue diagnosis Discussed case with Dr. Aundria Rud, will refer to pulmonology for biopsy via bronchoscopy

## 2023-06-03 NOTE — Progress Notes (Signed)
Pt here for follow up.

## 2023-06-03 NOTE — Assessment & Plan Note (Signed)
Patient had discussion with genetic counselor. 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 Recommend regular self skin examination, application of sunscreen, follow-up with dermatologist annually.

## 2023-06-03 NOTE — Assessment & Plan Note (Signed)
Gastric neuroendocrine carcinoma-well differentiated, Grade 2. Patient is on chronic PPI with initial high gastrin level.  Multiple gastric polyps.   Previously discussed with pathologist Dr. 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 Tumors arising in the setting of chronic PPI use appear to behave in a less malignant fashion than true-sporadic tumors. Recommend patient to continue EGD surveillance. Labs reviewed and discussed with patient.  Chronically elevated chromogranin A, assumed to be due to PPI.  02/06/2023 Dotatate PET -no recurrence, slow growing lung nodule.

## 2023-06-03 NOTE — Assessment & Plan Note (Signed)
Stable M protein and light chain ratio.  Continue observation.  Repeat in 1 year.

## 2023-06-13 DIAGNOSIS — R911 Solitary pulmonary nodule: Secondary | ICD-10-CM

## 2023-06-13 HISTORY — DX: Solitary pulmonary nodule: R91.1

## 2023-06-18 ENCOUNTER — Ambulatory Visit (INDEPENDENT_AMBULATORY_CARE_PROVIDER_SITE_OTHER): Payer: Medicare Other | Admitting: Student in an Organized Health Care Education/Training Program

## 2023-06-18 ENCOUNTER — Encounter: Payer: Self-pay | Admitting: Student in an Organized Health Care Education/Training Program

## 2023-06-18 VITALS — BP 120/70 | HR 86 | Temp 97.9°F | Ht 72.0 in | Wt 194.2 lb

## 2023-06-18 DIAGNOSIS — C7A8 Other malignant neuroendocrine tumors: Secondary | ICD-10-CM | POA: Diagnosis not present

## 2023-06-18 DIAGNOSIS — R911 Solitary pulmonary nodule: Secondary | ICD-10-CM

## 2023-06-18 NOTE — Progress Notes (Signed)
Synopsis: Referred in for pulmonary nodule by Rickard Patience, MD  Assessment & Plan:   #Lung nodule #Gastric Neuroendocrine Carcinoma  Nodule Location: RLL Nodule Size: 11 mm Nodule Spiculation: No Associated Lymphadenopathy: No Smoking Status (former) Extrathoracic cancer > 5 years prior (yes): gastric neuro-endocrine ECOG: 0  The patient is here to discuss their imaging abnormalities which include RLL nodule. The differential for the nodule includes metastasis from the gastric neuroendocrine tumor, a separate primary lung malignancy, or an inflammatory process.  We discussed the importance of diagnosis and staging in lung malignancies, and the approach to obtaining a tissue diagnosis which would include robotic assisted navigational bronchoscopy.  We also discussed the risks associated with the procedure which include a 2% risk of pneumothorax, infection, bleeding, and nondiagnostic procedure in detail.  I explained that patients typically are able to return home the same day of the procedure, but in rare cases admission to the hospital for observation and treatment is required.  After our discussion, the patient elected to proceed with the procedure  Recommendations: -Robotic assisted navigational bronchoscopy - Procedural/ Surgical Case Request: ROBOTIC ASSISTED NAVIGATIONAL BRONCHOSCOPY; Future - Ambulatory referral to Pulmonology - CT Super D Chest Wo Contrast; Future   No follow-ups on file.  I spent 60 minutes caring for this patient today, including preparing to see the patient, obtaining a medical history , reviewing a separately obtained history, performing a medically appropriate examination and/or evaluation, counseling and educating the patient/family/caregiver, ordering medications, tests, or procedures, documenting clinical information in the electronic health record, and independently interpreting results (not separately reported/billed) and communicating results to the  patient/family/caregiver  Raechel Chute, MD [facility] 06/18/2023 10:00 AM    End of visit medications:  No orders of the defined types were placed in this encounter.    Current Outpatient Medications:    ALPRAZolam (XANAX) 0.5 MG tablet, Take 0.5 mg by mouth 3 (three) times daily as needed for anxiety. , Disp: , Rfl:    amLODipine (NORVASC) 2.5 MG tablet, Take 5 mg by mouth daily., Disp: , Rfl:    aspirin EC 81 MG tablet, Take 81 mg by mouth daily., Disp: , Rfl:    azaTHIOprine (IMURAN) 50 MG tablet, Take 125 mg by mouth daily. , Disp: , Rfl:    cetirizine (ZYRTEC) 10 MG tablet, Take 10 mg by mouth daily., Disp: , Rfl:    cyanocobalamin (,VITAMIN B-12,) 1000 MCG/ML injection, Inject 1,000 mcg into the muscle every 14 (fourteen) days. , Disp: , Rfl:    ELDERBERRY PO, Take 2 tablets by mouth daily., Disp: , Rfl:    finasteride (PROSCAR) 5 MG tablet, Take 1 tablet (5 mg total) by mouth daily., Disp: 90 tablet, Rfl: 3   hydrochlorothiazide (HYDRODIURIL) 12.5 MG tablet, Take 12.5 mg by mouth daily., Disp: , Rfl:    hydroxychloroquine (PLAQUENIL) 200 MG tablet, Take 200 mg by mouth 2 (two) times daily., Disp: , Rfl:    ibuprofen (ADVIL,MOTRIN) 200 MG tablet, Take 400 mg by mouth every 8 (eight) hours as needed for headache or moderate pain., Disp: , Rfl:    metoprolol succinate (TOPROL-XL) 50 MG 24 hr tablet, Take 100 mg by mouth at bedtime. Take with or immediately following a meal., Disp: , Rfl:    nicotine (NICODERM CQ - DOSED IN MG/24 HOURS) 21 mg/24hr patch, Place 21 mg onto the skin daily., Disp: , Rfl:    nitroGLYCERIN (NITROLINGUAL) 0.4 MG/SPRAY spray, Place 1 spray under the tongue every 5 (five)  minutes x 3 doses as needed for chest pain., Disp: , Rfl:    omeprazole (PRILOSEC) 20 MG capsule, Take 20 mg by mouth at bedtime., Disp: , Rfl:    pravastatin (PRAVACHOL) 40 MG tablet, Take 40 mg by mouth at bedtime., Disp: , Rfl:    Probiotic Product (PROBIOTIC DAILY  PO), Take 1 capsule by mouth daily., Disp: , Rfl:    spironolactone (ALDACTONE) 25 MG tablet, Take 25 mg by mouth daily., Disp: , Rfl:    tadalafil (CIALIS) 20 MG tablet, Take 1 tablet (20 mg total) by mouth daily as needed for erectile dysfunction., Disp: 30 tablet, Rfl: 6   tamsulosin (FLOMAX) 0.4 MG CAPS capsule, Take 1 capsule (0.4 mg total) by mouth daily after breakfast., Disp: 30 capsule, Rfl: 11   triamcinolone (NASACORT) 55 MCG/ACT AERO nasal inhaler, Place 1 spray into the nose daily as needed (allergies)., Disp: , Rfl:    Subjective:   PATIENT ID: Sergio Callahan GENDER: male DOB: 1951-03-09, MRN: [id number]  Chief Complaint  Patient presents with   pulmonary consult    CT 7/3-no current sx.     HPI  Patient is a pleasant 72 year old male presenting to clinic for the evaluation of the right lower lobe nodule.  Patient was in his usual state of health and was noted to have a right lower lobe nodule that was grown on interval CT scan performed July 1st, 2024.  He was seen by oncology with Dr. Cathie Hoops where he is following for gastric neuroendocrine cancer. 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  He is referred to pulmonology for biopsy of pulmonary nodule to rule out malignancy.  Patient has no symptoms and denies any shortness of breath, chest pain, chest tightness, cough, sputum production, hemoptysis, night sweats, weight loss, fevers, chills, GI, or GU symptoms.  He is not on any anticoagulation besides aspirin.  He is not on Plavix or any other anticoagulants.  Patient reports a history of smoking, he quit a year ago, started smoking at the age of 56.  He probably has 30 to 40 pack years of smoking history.  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  He now works part-time jobs but denies any exposures.  Ancillary information including prior medications, full  medical/surgical/family/social histories, and PFTs (when available) are listed below and have been reviewed.   Review of Systems  Constitutional:  Negative for chills, fever and weight loss.  Respiratory:  Negative for cough, hemoptysis, sputum production, shortness of breath and wheezing.   Cardiovascular:  Negative for chest pain.  Skin:  Negative for rash.     Objective:   Vitals:   06/18/23 0931  BP: 120/70  Pulse: 86  Temp: 97.9 F (36.6 C)  SpO2: 97%  Weight: 194 lb 3.2 oz (88.1 kg)  Height: 6' (1.829 m)   97% on RA  BMI Readings from Last 3 Encounters:  06/18/23 26.34 kg/m  06/03/23 26.60 kg/m  04/22/23 27.12 kg/m   Wt Readings from Last 3 Encounters:  06/18/23 194 lb 3.2 oz (88.1 kg)  06/03/23 196 lb 1.6 oz (89 kg)  04/22/23 200 lb (90.7 kg)    Physical Exam Constitutional:      Appearance: Normal appearance. He is not ill-appearing.  HENT:     Mouth/Throat:     Mouth: Mucous membranes are moist.  Cardiovascular:     Rate and  Rhythm: Normal rate and regular rhythm.     Pulses: Normal pulses.     Heart sounds: Normal heart sounds.  Pulmonary:     Effort: Pulmonary effort is normal.     Breath sounds: Normal breath sounds.  Abdominal:     Palpations: Abdomen is soft.  Neurological:     General: No focal deficit present.     Mental Status: He is alert and oriented to person, place, and time. Mental status is at baseline.       Ancillary Information    Past Medical History:  Diagnosis Date   Abdominal aortic aneurysm without rupture (HCC)    Anemia    takes B12 injections monthly   Anogenital (venereal) warts    Anxiety    takes Xanax daily as needed   Arthritis    back    Back pain    synovial cyst   Carpal tunnel syndrome    Celiac artery atherosclerosis    Collagen vascular disease (HCC)    Hx Lupus.   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    Hx of adenomatous colonic polyps    Hyperlipidemia    takes Pravastatin nightly   Hypertension    takes Lisinopril-HCTZ and Metoprolol daily   Hyponatremia    Iliac artery aneurysm (HCC)    Lupus (HCC)    takes Plaquenil and Imran daily   Nocturia    Numbness and tingling    left hip/leg/foot   Peripheral vascular disease (HCC)    Peripheral vascular disease (HCC)    Rotator cuff tendonitis    Urinary frequency      Family History  Problem Relation Age of Onset   Leukemia Mother        dx 40s   Hyperlipidemia Father    Hypertension Father    Diabetes Father    Colon cancer Father        dx 72s   Hearing loss Father    Vascular Disease Father    Lung cancer Brother        d. 49     Past Surgical History:  Procedure Laterality Date   BACK SURGERY     BIOPSY  08/28/2021   Procedure: BIOPSY;  Surgeon: Mansouraty, Netty Starring., MD;  Location: [facility];  Service: Gastroenterology;;   BROW LIFT Bilateral 01/27/2021   Procedure: BLEPHAROPLASTY UPPER EYELID; W/EXCESS SKIN BROW PTOSIS REPAIR AND BLEPHAROPTOSIS REPAIR; RESECT EX BILATERAL;  Surgeon: Imagene Riches, MD;  [facility];  Service: Ophthalmology;  Laterality: Bilateral;   CARDIAC CATHETERIZATION  2012   COLONOSCOPY     COLONOSCOPY WITH PROPOFOL N/A 09/30/2019   Procedure: COLONOSCOPY WITH PROPOFOL;  Surgeon: Earline Mayotte, MD;  Location: [facility];  Service: Endoscopy;  Laterality: N/A;   COLONOSCOPY WITH PROPOFOL N/A 05/05/2021   Procedure: COLONOSCOPY WITH PROPOFOL;  Surgeon: Regis Bill, MD;  Location: [facility];  Service: Endoscopy;  Laterality: N/A;   CORONARY ANGIOPLASTY     1 stent   ESOPHAGOGASTRODUODENOSCOPY     ESOPHAGOGASTRODUODENOSCOPY (EGD) WITH PROPOFOL N/A 05/05/2021    Procedure: ESOPHAGOGASTRODUODENOSCOPY (EGD) WITH PROPOFOL;  Surgeon: Regis Bill,  MD;  Location: [facility];  Service: Endoscopy;  Laterality: N/A;   ESOPHAGOGASTRODUODENOSCOPY (EGD) WITH PROPOFOL N/A 08/28/2021   Procedure: ESOPHAGOGASTRODUODENOSCOPY (EGD) WITH PROPOFOL;  Surgeon: Meridee Score Netty Starring., MD;  Location: [facility];  Service: Gastroenterology;  Laterality: N/A;   ESOPHAGOGASTRODUODENOSCOPY (EGD) WITH PROPOFOL N/A 08/17/2022   Procedure: ESOPHAGOGASTRODUODENOSCOPY (EGD) WITH PROPOFOL;  Surgeon: Regis Bill, MD;  Location: [facility];  Service: Endoscopy;  Laterality: N/A;   EUS N/A 08/28/2021   Procedure: UPPER ENDOSCOPIC ULTRASOUND (EUS) RADIAL;  Surgeon: Lemar Lofty., MD;  Location: [facility];  Service: Gastroenterology;  Laterality: N/A;   LUMBAR LAMINECTOMY/DECOMPRESSION MICRODISCECTOMY Left 02/03/2015   Procedure: LUMBAR LAMINECTOMY/DECOMPRESSION MICRODISCECTOMY LEFT LUMBAR FOUR-FIVE WITH REMOVAL OF SYNOVIAL CYST;  Surgeon: Tressie Stalker, MD;  Location: [facility];  Service: Neurosurgery;  Laterality: Left;   NASAL SEPTOPLASTY W/ TURBINOPLASTY Bilateral 10/23/2019   Procedure: NASAL SEPTOPLASTY WITH SUBMUCOUS RESECTION OF TURBINATE;  Surgeon: Linus Salmons, MD;  Location: [facility];  Service: ENT;  Laterality: Bilateral;   PELVIC ANGIOGRAPHY N/A 05/02/2018   Procedure: PELVIC ANGIOGRAPHY;  Surgeon: Renford Dills, MD;  Location: [facility];  Service: Cardiovascular;  Laterality: N/A;   POLYPECTOMY  08/28/2021   Procedure: POLYPECTOMY;  Surgeon: Meridee Score Netty Starring., MD;  Location: [facility];  Service: Gastroenterology;;   SHOULDER ARTHROSCOPY WITH SUBACROMIAL DECOMPRESSION AND BICEP TENDON REPAIR Right 12/04/2018   Procedure: SHOULDER ARTHROSCOPY WITH DEBRIDEMENT, DECOMPRESSION, BICEP TENODESIS;  Surgeon: Christena Flake, MD;  Location: [facility];  Service: Orthopedics;  Laterality: Right;   SUBMUCOSAL  INJECTION  08/28/2021   Procedure: SUBMUCOSAL INJECTION;  Surgeon: Meridee Score Netty Starring., MD;  Location: [facility];  Service: Gastroenterology;;    Social History   Socioeconomic History   Marital status: Married    Spouse name: Not on file   Number of children: Not on file   Years of education: Not on file   Highest education level: Not on file  Occupational History   Not on file  Tobacco Use   Smoking status: Former    Current packs/day: 0.00    Average packs/day: 1.5 packs/day for 30.0 years (45.0 ttl pk-yrs)    Types: Cigarettes    Start date: 04/01/1991    Quit date: 03/31/2021    Years since quitting: 2.2   Smokeless tobacco: Never  Vaping Use   Vaping status: Never Used  Substance and Sexual Activity   Alcohol use: Yes    Comment: cocktails about 2 daily   Drug use: No   Sexual activity: Yes  Other Topics Concern   Not on file  Social History Narrative   Not on file   Social Determinants of Health   Financial Resource Strain: Not on file  Food Insecurity: Not on file  Transportation Needs: Not on file  Physical Activity: Not on file  Stress: Not on file  Social Connections: Not on file  Intimate Partner Violence: Not on file     Allergies  Allergen Reactions   Fosamax [Alendronate Sodium]     Leg pain   Percocet [Oxycodone-Acetaminophen] Nausea And Vomiting     CBC    Component Value Date/Time   WBC 6.1 04/22/2023 1519   RBC 4.80 04/22/2023 1519   HGB 15.5 04/22/2023 1519   HCT 46.2 04/22/2023 1519   PLT 251 04/22/2023 1519   MCV 96.3 04/22/2023 1519   MCH 32.3 04/22/2023 1519   MCHC 33.5 04/22/2023 1519   RDW 13.1 04/22/2023 1519   LYMPHSABS 1.2 01/14/2023 8295  MONOABS 0.4 01/14/2023 0938   EOSABS 0.1 01/14/2023 0938   BASOSABS 0.0 01/14/2023 1610    Pulmonary Functions Testing Results:     No data to display          Outpatient Medications Prior to Visit  Medication Sig Dispense Refill   ALPRAZolam (XANAX) 0.5 MG tablet  Take 0.5 mg by mouth 3 (three) times daily as needed for anxiety.      amLODipine (NORVASC) 2.5 MG tablet Take 5 mg by mouth daily.     aspirin EC 81 MG tablet Take 81 mg by mouth daily.     azaTHIOprine (IMURAN) 50 MG tablet Take 125 mg by mouth daily.      cetirizine (ZYRTEC) 10 MG tablet Take 10 mg by mouth daily.     cyanocobalamin (,VITAMIN B-12,) 1000 MCG/ML injection Inject 1,000 mcg into the muscle every 14 (fourteen) days.      ELDERBERRY PO Take 2 tablets by mouth daily.     finasteride (PROSCAR) 5 MG tablet Take 1 tablet (5 mg total) by mouth daily. 90 tablet 3   hydrochlorothiazide (HYDRODIURIL) 12.5 MG tablet Take 12.5 mg by mouth daily.     hydroxychloroquine (PLAQUENIL) 200 MG tablet Take 200 mg by mouth 2 (two) times daily.     ibuprofen (ADVIL,MOTRIN) 200 MG tablet Take 400 mg by mouth every 8 (eight) hours as needed for headache or moderate pain.     metoprolol succinate (TOPROL-XL) 50 MG 24 hr tablet Take 100 mg by mouth at bedtime. Take with or immediately following a meal.     nicotine (NICODERM CQ - DOSED IN MG/24 HOURS) 21 mg/24hr patch Place 21 mg onto the skin daily.     nitroGLYCERIN (NITROLINGUAL) 0.4 MG/SPRAY spray Place 1 spray under the tongue every 5 (five) minutes x 3 doses as needed for chest pain.     omeprazole (PRILOSEC) 20 MG capsule Take 20 mg by mouth at bedtime.     pravastatin (PRAVACHOL) 40 MG tablet Take 40 mg by mouth at bedtime.     Probiotic Product (PROBIOTIC DAILY PO) Take 1 capsule by mouth daily.     spironolactone (ALDACTONE) 25 MG tablet Take 25 mg by mouth daily.     tadalafil (CIALIS) 20 MG tablet Take 1 tablet (20 mg total) by mouth daily as needed for erectile dysfunction. 30 tablet 6   tamsulosin (FLOMAX) 0.4 MG CAPS capsule Take 1 capsule (0.4 mg total) by mouth daily after breakfast. 30 capsule 11   triamcinolone (NASACORT) 55 MCG/ACT AERO nasal inhaler Place 1 spray into the nose daily as needed (allergies).     No  facility-administered medications prior to visit.

## 2023-07-10 ENCOUNTER — Ambulatory Visit: Payer: PRIVATE HEALTH INSURANCE

## 2023-07-17 ENCOUNTER — Encounter (HOSPITAL_COMMUNITY): Payer: Self-pay | Admitting: Student in an Organized Health Care Education/Training Program

## 2023-07-17 ENCOUNTER — Ambulatory Visit
Admission: RE | Admit: 2023-07-17 | Discharge: 2023-07-17 | Disposition: A | Discharge status: Home / Self Care | Payer: Medicare Other | Source: Ambulatory Visit | Attending: Student in an Organized Health Care Education/Training Program | Admitting: Student in an Organized Health Care Education/Training Program

## 2023-07-17 ENCOUNTER — Other Ambulatory Visit: Payer: Self-pay

## 2023-07-17 DIAGNOSIS — R911 Solitary pulmonary nodule: Secondary | ICD-10-CM | POA: Insufficient documentation

## 2023-07-17 NOTE — Anesthesia Preprocedure Evaluation (Signed)
Anesthesia Evaluation  Patient identified by MRN, date of birth, ID band Patient awake    Reviewed: Allergy & Precautions, NPO status , Patient's Chart, lab work & pertinent test results  Airway Mallampati: III  TM Distance: >3 FB Neck ROM: Full    Dental no notable dental hx.    Pulmonary former smoker   Pulmonary exam normal        Cardiovascular hypertension, Pt. on medications and Pt. on home beta blockers + CAD, + Cardiac Stents and + Peripheral Vascular Disease  Normal cardiovascular exam     Neuro/Psych   Anxiety      Neuromuscular disease    GI/Hepatic Neg liver ROS,GERD  Medicated and Controlled,,  Endo/Other  negative endocrine ROS    Renal/GU negative Renal ROS     Musculoskeletal  (+) Arthritis ,    Abdominal   Peds  Hematology negative hematology ROS (+)   Anesthesia Other Findings Lung nodule  Reproductive/Obstetrics                             Anesthesia Physical Anesthesia Plan  ASA: 3  Anesthesia Plan: General   Post-op Pain Management:    Induction: Intravenous  PONV Risk Score and Plan: 2 and Ondansetron, Dexamethasone, Propofol infusion, Midazolam and Treatment may vary due to age or medical condition  Airway Management Planned: Oral ETT  Additional Equipment:   Intra-op Plan:   Post-operative Plan: Extubation in OR  Informed Consent: I have reviewed the patients History and Physical, chart, labs and discussed the procedure including the risks, benefits and alternatives for the proposed anesthesia with the patient or authorized representative who has indicated his/her understanding and acceptance.     Dental advisory given  Plan Discussed with: CRNA  Anesthesia Plan Comments: (PAT note by Antionette Poles, PA-C: Follows with cardiology at [facility] clinic for history of CAD s/p PCI to the LAD in 2012, peripheral vascular disease s/p stenting to bilateral  common iliac arteries, and hypertension.  Nuclear stress test 2019 was low risk.  Last seen by Dr. Juliann Pares 04/29/2023, antihypertensive regimen was reduced due to hypotension. HCTZ was discontinued.  Otherwise stable from cardiac standpoint.  54-month follow-up recommended.  Follows with oncology and gastroenterology for history of neuroendocrine carcinoma of the stomach.  He is monitored with serial EGD.  He is maintained on PPI for GERD.  He is also noted to have a lung nodule slowly increasing in size since 22.  He was referred to pulmonology for biopsy.  Former smoker, 45 pack years, quit 2022.  History of lupus, maintained on Imuran and Plaquenil.  BMP and CBC 06/17/2023 in Care Everywhere reviewed, unremarkable.  Hepatic function panel with mildly elevated AST at 51 and ALT of 73.  Patient will need to have surgery evaluation.  EKG 04/22/2023: Sinus tachycardia.  Rate 91.  Supraventricular bigeminy.  Borderline prolonged QT interval.  CT chest 05/13/2023: IMPRESSION: 1. 10 mm posterior right lower lobe nodule has enlarged slightly from baseline 07/27/2022, worrisome for primary bronchogenic carcinoma. Nodule is slightly too small for PET resolution on 02/04/2023. Follow-up CT chest without contrast in 3-6 months is recommended, as clinically indicated. 2. Liver appears steatotic. 3. Bilateral adrenal adenomas. 4. Aortic atherosclerosis (ICD10-I70.0). Coronary artery calcification. 5.  Emphysema (ICD10-J43.9).  NM Myocardial Perfusion SPECT multiple (stress and rest): (02/14/2018) IMPRESSION: Negative regadenoson stress. LV function normal. No evidence of reversible ischemia. Low risk study.    )  Anesthesia Quick Evaluation

## 2023-07-17 NOTE — Progress Notes (Signed)
Anesthesia Chart Review: Same day workup  Follows with cardiology at [facility] clinic for history of CAD s/p PCI to the LAD in 2012, peripheral vascular disease s/p stenting to bilateral common iliac arteries, and hypertension.  Nuclear stress test 2019 was low risk.  Last seen by Dr. Juliann Pares 04/29/2023, antihypertensive regimen was reduced due to hypotension. HCTZ was discontinued.  Otherwise stable from cardiac standpoint.  48-month follow-up recommended.  Follows with oncology and gastroenterology for history of neuroendocrine carcinoma of the stomach.  He is monitored with serial EGD.  He is maintained on PPI for GERD.  He is also noted to have a lung nodule slowly increasing in size since 22.  He was referred to pulmonology for biopsy.  Former smoker, 45 pack years, quit 2022.  History of lupus, maintained on Imuran and Plaquenil.  BMP and CBC 06/17/2023 in Care Everywhere reviewed, unremarkable.  Hepatic function panel with mildly elevated AST at 51 and ALT of 73.  Patient will need to have surgery evaluation.  EKG 04/22/2023: Sinus tachycardia.  Rate 91.  Supraventricular bigeminy.  Borderline prolonged QT interval.  CT chest 05/13/2023: IMPRESSION: 1. 10 mm posterior right lower lobe nodule has enlarged slightly from baseline 07/27/2022, worrisome for primary bronchogenic carcinoma. Nodule is slightly too small for PET resolution on 02/04/2023. Follow-up CT chest without contrast in 3-6 months is recommended, as clinically indicated. 2. Liver appears steatotic. 3. Bilateral adrenal adenomas. 4. Aortic atherosclerosis (ICD10-I70.0). Coronary artery calcification. 5.  Emphysema (ICD10-J43.9).  NM Myocardial Perfusion SPECT multiple (stress and rest): (02/14/2018) IMPRESSION: Negative regadenoson stress. LV function normal. No evidence of reversible ischemia. Low risk study.     Syed, Zalar [facility] Short Stay Center/Anesthesiology Phone [phone] 07/17/2023 12:25 PM

## 2023-07-17 NOTE — Progress Notes (Signed)
PCP - Dr Marcelino Duster Cardiologist - Dr Dorothyann Peng Oncology - Dr Cathie Hoops  CT Chest x-ray - 07/17/23 EKG - 04/22/23 Stress Test - 02/14/18 ECHO - n/a Cardiac Cath - 12/08/10  ICD Pacemaker/Loop - n/a  Sleep Study -  n/a CPAP - none  Diabetes - n/a  Aspirin Instructions: Follow your surgeon's instructions on when to stop aspirin prior to surgery,  If no instructions were given by your surgeon then you will need to call the office for those instructions.  NPO  Anesthesia review: Yes  STOP now taking any Aspirin (unless otherwise instructed by your surgeon), Aleve, Naproxen, Ibuprofen, Motrin, Advil, Goody's, BC's, all herbal medications, fish oil, and all vitamins.   Coronavirus Screening Do you have any of the following symptoms:  Cough yes/no: No Fever (>100.95F)  yes/no: No Runny nose yes/no: No Sore throat yes/no: No Difficulty breathing/shortness of breath  yes/no: No  Have you traveled in the last 14 days and where? yes/no: No  Patient verbalized understanding of instructions that were given via phone.

## 2023-07-18 ENCOUNTER — Ambulatory Visit (HOSPITAL_COMMUNITY)
Admission: RE | Admit: 2023-07-18 | Discharge: 2023-07-18 | Disposition: A | Discharge status: Home / Self Care | Payer: Medicare Other | Attending: Student in an Organized Health Care Education/Training Program | Admitting: Student in an Organized Health Care Education/Training Program

## 2023-07-18 ENCOUNTER — Ambulatory Visit (HOSPITAL_COMMUNITY): Payer: Medicare Other | Admitting: Physician Assistant

## 2023-07-18 ENCOUNTER — Encounter (HOSPITAL_COMMUNITY): Payer: Self-pay | Admitting: Student in an Organized Health Care Education/Training Program

## 2023-07-18 ENCOUNTER — Ambulatory Visit (HOSPITAL_COMMUNITY): Payer: Medicare Other

## 2023-07-18 ENCOUNTER — Encounter (HOSPITAL_COMMUNITY)
Admission: RE | Disposition: A | Discharge status: Home / Self Care | Payer: Self-pay | Source: Home / Self Care | Attending: Student in an Organized Health Care Education/Training Program

## 2023-07-18 ENCOUNTER — Ambulatory Visit (HOSPITAL_BASED_OUTPATIENT_CLINIC_OR_DEPARTMENT_OTHER): Payer: Medicare Other | Admitting: Physician Assistant

## 2023-07-18 ENCOUNTER — Other Ambulatory Visit: Payer: Self-pay

## 2023-07-18 DIAGNOSIS — Z9861 Coronary angioplasty status: Secondary | ICD-10-CM | POA: Insufficient documentation

## 2023-07-18 DIAGNOSIS — I700 Atherosclerosis of aorta: Secondary | ICD-10-CM | POA: Insufficient documentation

## 2023-07-18 DIAGNOSIS — I739 Peripheral vascular disease, unspecified: Secondary | ICD-10-CM | POA: Insufficient documentation

## 2023-07-18 DIAGNOSIS — Z87891 Personal history of nicotine dependence: Secondary | ICD-10-CM | POA: Insufficient documentation

## 2023-07-18 DIAGNOSIS — I10 Essential (primary) hypertension: Secondary | ICD-10-CM

## 2023-07-18 DIAGNOSIS — Z8249 Family history of ischemic heart disease and other diseases of the circulatory system: Secondary | ICD-10-CM | POA: Insufficient documentation

## 2023-07-18 DIAGNOSIS — R911 Solitary pulmonary nodule: Secondary | ICD-10-CM

## 2023-07-18 DIAGNOSIS — D3502 Benign neoplasm of left adrenal gland: Secondary | ICD-10-CM | POA: Diagnosis not present

## 2023-07-18 DIAGNOSIS — C7B8 Other secondary neuroendocrine tumors: Secondary | ICD-10-CM | POA: Diagnosis not present

## 2023-07-18 DIAGNOSIS — D3501 Benign neoplasm of right adrenal gland: Secondary | ICD-10-CM | POA: Diagnosis not present

## 2023-07-18 DIAGNOSIS — Z9889 Other specified postprocedural states: Secondary | ICD-10-CM | POA: Insufficient documentation

## 2023-07-18 DIAGNOSIS — F17210 Nicotine dependence, cigarettes, uncomplicated: Secondary | ICD-10-CM

## 2023-07-18 DIAGNOSIS — J439 Emphysema, unspecified: Secondary | ICD-10-CM | POA: Insufficient documentation

## 2023-07-18 DIAGNOSIS — I2510 Atherosclerotic heart disease of native coronary artery without angina pectoris: Secondary | ICD-10-CM | POA: Diagnosis not present

## 2023-07-18 DIAGNOSIS — K219 Gastro-esophageal reflux disease without esophagitis: Secondary | ICD-10-CM | POA: Insufficient documentation

## 2023-07-18 HISTORY — DX: Malignant (primary) neoplasm, unspecified: C80.1

## 2023-07-18 HISTORY — PX: BRONCHIAL NEEDLE ASPIRATION BIOPSY: SHX5106

## 2023-07-18 HISTORY — PX: BRONCHIAL WASHINGS: SHX5105

## 2023-07-18 HISTORY — DX: Dyspnea, unspecified: R06.00

## 2023-07-18 HISTORY — PX: BRONCHIAL BIOPSY: SHX5109

## 2023-07-18 SURGERY — BRONCHOSCOPY, WITH BIOPSY USING ELECTROMAGNETIC NAVIGATION
Anesthesia: General | Laterality: Bilateral

## 2023-07-18 MED ORDER — SUGAMMADEX SODIUM 200 MG/2ML IV SOLN
INTRAVENOUS | Status: DC | PRN
  Administered 2023-07-18: 200 mg via INTRAVENOUS

## 2023-07-18 MED ORDER — PROPOFOL 500 MG/50ML IV EMUL
INTRAVENOUS | Status: DC | PRN
Start: 2023-07-18 — End: 2023-07-18
  Administered 2023-07-18: 125 ug/kg/min via INTRAVENOUS

## 2023-07-18 MED ORDER — DEXAMETHASONE SODIUM PHOSPHATE 10 MG/ML IJ SOLN
INTRAMUSCULAR | Status: DC | PRN
  Administered 2023-07-18: 10 mg via INTRAVENOUS

## 2023-07-18 MED ORDER — CHLORHEXIDINE GLUCONATE 0.12 % MT SOLN
15.0000 mL | Freq: Once | OROMUCOSAL | Status: AC
  Administered 2023-07-18: 15 mL via OROMUCOSAL

## 2023-07-18 MED ORDER — FENTANYL CITRATE (PF) 100 MCG/2ML IJ SOLN: 25.0000 ug | INTRAMUSCULAR | Status: DC | PRN

## 2023-07-18 MED ORDER — ONDANSETRON HCL 4 MG/2ML IJ SOLN: 4.0000 mg | Freq: Once | INTRAMUSCULAR | Status: DC | PRN

## 2023-07-18 MED ORDER — ACETAMINOPHEN 10 MG/ML IV SOLN: 1000.0000 mg | Freq: Once | INTRAVENOUS | Status: DC | PRN

## 2023-07-18 MED ORDER — LIDOCAINE 2% (20 MG/ML) 5 ML SYRINGE
INTRAMUSCULAR | Status: DC | PRN
  Administered 2023-07-18: 60 mg via INTRAVENOUS

## 2023-07-18 MED ORDER — KETOROLAC TROMETHAMINE 15 MG/ML IJ SOLN: 15.0000 mg | Freq: Once | INTRAMUSCULAR | Status: DC | PRN

## 2023-07-18 MED ORDER — MIDAZOLAM HCL 2 MG/2ML IJ SOLN
INTRAMUSCULAR | Status: DC | PRN
  Administered 2023-07-18: 2 mg via INTRAVENOUS

## 2023-07-18 MED ORDER — CHLORHEXIDINE GLUCONATE 0.12 % MT SOLN
OROMUCOSAL | Status: AC
  Filled 2023-07-18: qty 15

## 2023-07-18 MED ORDER — ONDANSETRON HCL 4 MG/2ML IJ SOLN
INTRAMUSCULAR | Status: DC | PRN
  Administered 2023-07-18: 4 mg via INTRAVENOUS

## 2023-07-18 MED ORDER — AMISULPRIDE (ANTIEMETIC) 5 MG/2ML IV SOLN: 10.0000 mg | Freq: Once | INTRAVENOUS | Status: DC | PRN

## 2023-07-18 MED ORDER — FENTANYL CITRATE (PF) 250 MCG/5ML IJ SOLN
INTRAMUSCULAR | Status: DC | PRN
  Administered 2023-07-18: 100 ug via INTRAVENOUS

## 2023-07-18 MED ORDER — PHENYLEPHRINE 80 MCG/ML (10ML) SYRINGE FOR IV PUSH (FOR BLOOD PRESSURE SUPPORT)
PREFILLED_SYRINGE | INTRAVENOUS | Status: DC | PRN
  Administered 2023-07-18: 80 ug via INTRAVENOUS
  Administered 2023-07-18: 160 ug via INTRAVENOUS
  Administered 2023-07-18: 80 ug via INTRAVENOUS

## 2023-07-18 MED ORDER — PROPOFOL 10 MG/ML IV BOLUS
INTRAVENOUS | Status: DC | PRN
  Administered 2023-07-18: 150 mg via INTRAVENOUS

## 2023-07-18 MED ORDER — ROCURONIUM BROMIDE 10 MG/ML (PF) SYRINGE
PREFILLED_SYRINGE | INTRAVENOUS | Status: DC | PRN
  Administered 2023-07-18: 50 mg via INTRAVENOUS

## 2023-07-18 MED ORDER — LACTATED RINGERS IV SOLN: INTRAVENOUS | Status: DC

## 2023-07-18 NOTE — H&P (Signed)
Assessment & Plan:   #Lung nodule #Gastric Neuroendocrine Carcinoma   Nodule Location: RLL Nodule Size: 11 mm Nodule Spiculation: No Associated Lymphadenopathy: No Smoking Status (former) Extrathoracic cancer > 5 years prior (yes): gastric neuro-endocrine ECOG: 0   Patient is here for the evaluation of the RLL nodule. He has a history of gastric neuroendocrine tumor, with a differential that includes neuroendocrine tumor metastasis, a primary lung malignancy, and an inflammatory/infectious process given immune suppression. Plan is for robotic assisted navigational bronchoscopy and BAL. Patient is appropriate for the procedure.  Raechel Chute, MD [facility] 07/18/2023 7:33 AM    End of visit medications:  Meds ordered this encounter  Medications   lactated ringers infusion   chlorhexidine (PERIDEX) 0.12 % solution 15 mL   chlorhexidine (PERIDEX) 0.12 % solution    Amanda Pea M: cabinet override     Current Facility-Administered Medications:    chlorhexidine (PERIDEX) 0.12 % solution, , , ,    lactated ringers infusion, , Intravenous, Continuous, Ellender, Catheryn Bacon, MD, New Bag at 07/18/23 0700   Subjective:   PATIENT ID: Sergio Callahan GENDER: male DOB: 06-15-1951, MRN: [id number]  No chief complaint on file.   HPI  Patient is a pleasant 72 year old male presenting for the evaluation of the right lower lobe nodule.   Patient was in his usual state of health and was noted to have a right lower lobe nodule that was grown on interval CT scan performed July 1st, 2024.  He was seen by oncology with Dr. Cathie Hoops where he is following for gastric neuroendocrine cancer. 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  He is referred to pulmonology for biopsy of pulmonary nodule to rule out malignancy.   Patient has no symptoms and denies any shortness of breath, chest pain, chest tightness, cough, sputum production,  hemoptysis, night sweats, weight loss, fevers, chills, GI, or GU symptoms.  He is not on any anticoagulation besides aspirin.  He is not on Plavix or any other anticoagulants.   Patient reports a history of smoking, he quit a year ago, started smoking at the age of 47.  He probably has 30 to 40 pack years of smoking history.  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  He now works part-time jobs but denies any exposures.  Ancillary information including prior medications, full medical/surgical/family/social histories, and PFTs (when available) are listed below and have been reviewed.   Review of Systems  Constitutional:  Negative for chills, fever, malaise/fatigue and weight loss.  Respiratory:  Negative for cough, hemoptysis, sputum production, shortness of breath and wheezing.   Cardiovascular:  Negative for chest pain.     Objective:   Vitals:   07/17/23 1051 07/18/23 0614  BP:  (!) 153/95  Pulse:  70  Resp:  16  Temp:  98 F (36.7 C)  TempSrc:  Oral  SpO2:  96%  Weight: 88.1 kg 88.5 kg  Height: 6' (1.829 m) 6' (1.829 m)   96% on RA BMI Readings from Last 3 Encounters:  07/18/23 26.45 kg/m  06/18/23 26.34 kg/m  06/03/23 26.60 kg/m   Wt Readings from Last 3 Encounters:  07/18/23 88.5 kg  06/18/23 88.1 kg  06/03/23 89 kg    Physical Exam Constitutional:      Appearance: Normal appearance. He is not ill-appearing.  HENT:     Mouth/Throat:  Mouth: Mucous membranes are moist.  Cardiovascular:     Rate and Rhythm: Normal rate and regular rhythm.     Pulses: Normal pulses.     Heart sounds: Normal heart sounds.  Pulmonary:     Effort: Pulmonary effort is normal.     Breath sounds: Normal breath sounds.  Abdominal:     Palpations: Abdomen is soft.  Musculoskeletal:     Right lower leg: No edema.     Left lower leg: No edema.  Neurological:     General: No focal deficit present.     Mental Status: He is  alert and oriented to person, place, and time. Mental status is at baseline.       Ancillary Information    Past Medical History:  Diagnosis Date   Abdominal aortic aneurysm without rupture (HCC)    Anemia    takes B12 injections monthly   Anogenital (venereal) warts    Anxiety    takes Xanax daily as needed   Arthritis    back    Back pain    synovial cyst   Cancer (HCC)    Carpal tunnel syndrome    surgery to correct, no current problems   Celiac artery atherosclerosis    Collagen vascular disease (HCC)    Hx Lupus.   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    Hx of adenomatous colonic polyps    Hyperlipidemia    takes Pravastatin nightly   Hypertension    takes Lisinopril-HCTZ and Metoprolol daily   Hyponatremia    Iliac artery aneurysm (HCC)    Lung nodule 06/2023   right lower lobe   Lupus (HCC)    takes Plaquenil and Imran daily   Nocturia    Numbness and tingling    left hip/leg/foot   Peripheral vascular disease (HCC)    Rotator cuff tendonitis    Urinary frequency      Family History  Problem Relation Age of Onset   Leukemia Mother        dx 35s   Hyperlipidemia Father    Hypertension Father    Diabetes Father    Colon cancer Father        dx 81s   Hearing loss Father    Vascular Disease Father    Lung cancer Brother        d. 23     Past Surgical History:  Procedure Laterality Date   BACK SURGERY     BIOPSY  08/28/2021   Procedure: BIOPSY;  Surgeon: Meridee Score, Netty Starring., MD;  Location: [facility];  Service: Gastroenterology;;   BROW LIFT Bilateral 01/27/2021   Procedure: BLEPHAROPLASTY  UPPER EYELID; W/EXCESS SKIN BROW PTOSIS REPAIR AND BLEPHAROPTOSIS REPAIR; RESECT EX BILATERAL;  Surgeon: Imagene Riches, MD;  Location: [facility];  Service: Ophthalmology;  Laterality: Bilateral;   CARDIAC CATHETERIZATION  2012   COLONOSCOPY     COLONOSCOPY WITH PROPOFOL N/A 09/30/2019   Procedure: COLONOSCOPY WITH PROPOFOL;  Surgeon: Earline Mayotte, MD;  Location: [facility];  Service: Endoscopy;  Laterality: N/A;  COLONOSCOPY WITH PROPOFOL N/A 05/05/2021   Procedure: COLONOSCOPY WITH PROPOFOL;  Surgeon: Regis Bill, MD;  Location: [facility];  Service: Endoscopy;  Laterality: N/A;   CORONARY ANGIOPLASTY     1 stent   ESOPHAGOGASTRODUODENOSCOPY     ESOPHAGOGASTRODUODENOSCOPY (EGD) WITH PROPOFOL N/A 05/05/2021   Procedure: ESOPHAGOGASTRODUODENOSCOPY (EGD) WITH PROPOFOL;  Surgeon: Regis Bill, MD;  Location: [facility];  Service: Endoscopy;  Laterality: N/A;   ESOPHAGOGASTRODUODENOSCOPY (EGD) WITH PROPOFOL N/A 08/28/2021   Procedure: ESOPHAGOGASTRODUODENOSCOPY (EGD) WITH PROPOFOL;  Surgeon: Meridee Score Netty Starring., MD;  Location: [facility];  Service: Gastroenterology;  Laterality: N/A;   ESOPHAGOGASTRODUODENOSCOPY (EGD) WITH PROPOFOL N/A 08/17/2022   Procedure: ESOPHAGOGASTRODUODENOSCOPY (EGD) WITH PROPOFOL;  Surgeon: Regis Bill, MD;  Location: [facility];  Service: Endoscopy;  Laterality: N/A;   EUS N/A 08/28/2021   Procedure: UPPER ENDOSCOPIC ULTRASOUND (EUS) RADIAL;  Surgeon: Lemar Lofty., MD;  Location: [facility];  Service: Gastroenterology;  Laterality: N/A;   LUMBAR LAMINECTOMY/DECOMPRESSION MICRODISCECTOMY Left 02/03/2015   Procedure: LUMBAR LAMINECTOMY/DECOMPRESSION MICRODISCECTOMY LEFT LUMBAR FOUR-FIVE WITH REMOVAL OF SYNOVIAL CYST;  Surgeon: Tressie Stalker, MD;  Location: [facility];  Service: Neurosurgery;  Laterality: Left;   NASAL SEPTOPLASTY W/ TURBINOPLASTY Bilateral 10/23/2019   Procedure: NASAL SEPTOPLASTY  WITH SUBMUCOUS RESECTION OF TURBINATE;  Surgeon: Linus Salmons, MD;  Location: [facility];  Service: ENT;  Laterality: Bilateral;   PELVIC ANGIOGRAPHY N/A 05/02/2018   Procedure: PELVIC ANGIOGRAPHY;  Surgeon: Renford Dills, MD;  Location: [facility];  Service: Cardiovascular;  Laterality: N/A;   POLYPECTOMY  08/28/2021   Procedure: POLYPECTOMY;  Surgeon: Meridee Score Netty Starring., MD;  Location: [facility];  Service: Gastroenterology;;   SHOULDER ARTHROSCOPY WITH SUBACROMIAL DECOMPRESSION AND BICEP TENDON REPAIR Right 12/04/2018   Procedure: SHOULDER ARTHROSCOPY WITH DEBRIDEMENT, DECOMPRESSION, BICEP TENODESIS;  Surgeon: Christena Flake, MD;  Location: [facility];  Service: Orthopedics;  Laterality: Right;   SUBMUCOSAL INJECTION  08/28/2021   Procedure: SUBMUCOSAL INJECTION;  Surgeon: Meridee Score Netty Starring., MD;  Location: [facility];  Service: Gastroenterology;;    Social History   Socioeconomic History   Marital status: Married    Spouse name: Not on file   Number of children: Not on file   Years of education: Not on file   Highest education level: Not on file  Occupational History   Not on file  Tobacco Use   Smoking status: Former    Current packs/day: 0.00    Average packs/day: 1.5 packs/day for 30.0 years (45.0 ttl pk-yrs)    Types: Cigarettes    Start date: 04/01/1991    Quit date: 03/31/2021    Years since quitting: 2.2   Smokeless tobacco: Never  Vaping Use   Vaping status: Never Used  Substance and Sexual Activity   Alcohol use: Yes    Alcohol/week: 14.0 standard drinks of alcohol    Types: 14 Standard drinks or equivalent per week    Comment: cocktails about 2 daily   Drug use: No   Sexual activity: Yes  Other Topics Concern   Not on file  Social History Narrative   Not on file   Social Determinants of Health   Financial Resource Strain: Not on file  Food Insecurity: Not on file  Transportation Needs: Not on file  Physical Activity:  Not on file  Stress: Not on file  Social Connections: Not on file  Intimate Partner Violence: Not on file     Allergies  Allergen Reactions   Fosamax [Alendronate Sodium]  Leg pain   Percocet [Oxycodone-Acetaminophen] Nausea And Vomiting     CBC    Component Value Date/Time   WBC 6.1 04/22/2023 1519   RBC 4.80 04/22/2023 1519   HGB 15.5 04/22/2023 1519   HCT 46.2 04/22/2023 1519   PLT 251 04/22/2023 1519   MCV 96.3 04/22/2023 1519   MCH 32.3 04/22/2023 1519   MCHC 33.5 04/22/2023 1519   RDW 13.1 04/22/2023 1519   LYMPHSABS 1.2 01/14/2023 0938   MONOABS 0.4 01/14/2023 0938   EOSABS 0.1 01/14/2023 0938   BASOSABS 0.0 01/14/2023 8295    Pulmonary Functions Testing Results:     No data to display          @ENCMEDSTART @

## 2023-07-18 NOTE — Anesthesia Procedure Notes (Signed)
Procedure Name: Intubation Date/Time: 07/18/2023 7:47 AM  Performed by: Garfield Cornea, CRNAPre-anesthesia Checklist: Patient identified, Emergency Drugs available, Suction available and Patient being monitored Patient Re-evaluated:Patient Re-evaluated prior to induction Oxygen Delivery Method: Circle System Utilized Preoxygenation: Pre-oxygenation with 100% oxygen Induction Type: IV induction Ventilation: Mask ventilation without difficulty and Oral airway inserted - appropriate to patient size Laryngoscope Size: Mac and 4 Grade View: Grade II Tube type: Oral Tube size: 8.5 mm Number of attempts: 1 Airway Equipment and Method: Stylet and Oral airway Placement Confirmation: ETT inserted through vocal cords under direct vision, positive ETCO2 and breath sounds checked- equal and bilateral Secured at: 23 cm Tube secured with: Tape Dental Injury: Teeth and Oropharynx as per pre-operative assessment  Comments: Grade IIb view with Mac 4

## 2023-07-18 NOTE — Anesthesia Postprocedure Evaluation (Signed)
Anesthesia Post Note  Patient: Hortencia Conradi  Procedure(s) Performed: ROBOTIC ASSISTED NAVIGATIONAL BRONCHOSCOPY (Bilateral) BRONCHIAL BIOPSIES BRONCHIAL WASHINGS BRONCHIAL NEEDLE ASPIRATION BIOPSIES     Patient location during evaluation: PACU Anesthesia Type: General Level of consciousness: awake Pain management: pain level controlled Vital Signs Assessment: post-procedure vital signs reviewed and stable Respiratory status: spontaneous breathing, nonlabored ventilation and respiratory function stable Cardiovascular status: blood pressure returned to baseline and stable Postop Assessment: no apparent nausea or vomiting Anesthetic complications: no   No notable events documented.  Last Vitals:  Vitals:   07/18/23 0950 07/18/23 0955  BP: (!) 155/92   Pulse: 65 67  Resp: 13 17  Temp:    SpO2: 98% 96%    Last Pain:  Vitals:   07/18/23 0920  TempSrc: Temporal  PainSc: 0-No pain                 Giannina Bartolome P Clariece Roesler

## 2023-07-18 NOTE — Transfer of Care (Signed)
Immediate Anesthesia Transfer of Care Note  Patient: Sergio Callahan  Procedure(s) Performed: ROBOTIC ASSISTED NAVIGATIONAL BRONCHOSCOPY (Bilateral) BRONCHIAL BIOPSIES BRONCHIAL WASHINGS BRONCHIAL NEEDLE ASPIRATION BIOPSIES  Patient Location: Endoscopy Unit  Anesthesia Type:General  Level of Consciousness: drowsy  Airway & Oxygen Therapy: Patient Spontanous Breathing and Patient connected to face mask oxygen  Post-op Assessment: Report given to RN and Post -op Vital signs reviewed and stable  Post vital signs: Reviewed and stable  Last Vitals:  Vitals Value Taken Time  BP 115/75 07/18/23 0850  Temp    Pulse 76 07/18/23 0850  Resp 16 07/18/23 0850  SpO2 96 % 07/18/23 0850  Vitals shown include unfiled device data.  Last Pain:  Vitals:   07/18/23 0618  TempSrc:   PainSc: 0-No pain      Patients Stated Pain Goal: 0 (07/18/23 0618)  Complications: No notable events documented.

## 2023-07-18 NOTE — Op Note (Signed)
Video Bronchoscopy with Robotic Assisted Bronchoscopic Navigation   Date of Operation: 07/18/2023   Pre-op Diagnosis: Pulmonary nodule  Post-op Diagnosis: Pulmonary Nodule  Surgeon: Raechel Chute, MD  Anesthesia: General endotracheal anesthesia  Operation: Flexible video fiberoptic bronchoscopy with robotic assistance and biopsies.  Estimated Blood Loss: Minimal  Complications: None  Indications and History: Sergio Callahan is a 72 y.o. male with history of gastric neuroendocrine tumor and Lupus on immune suppression who presents with a RLL nodule that showed growth on interval imaging. He presents for robotic assisted navigational bronchoscopy. The risks, benefits, complications, treatment options and expected outcomes were discussed with the patient.  The possibilities of pneumothorax, pneumonia, reaction to medication, pulmonary aspiration, perforation of a viscus, bleeding, failure to diagnose a condition and creating a complication requiring transfusion or operation were discussed with the patient who freely signed the consent.    Description of Procedure: The patient was seen in the Preoperative Area, was examined and was deemed appropriate to proceed.  The patient was taken to [facility] endoscopy room 3, identified as Sergio Callahan and the procedure verified as Flexible Video Fiberoptic Bronchoscopy.  A Time Out was held and the above information confirmed.   Prior to the date of the procedure a high-resolution CT scan of the chest was performed. Utilizing ION software program a virtual tracheobronchial tree was generated to allow the creation of distinct navigation pathways to the patient's parenchymal abnormalities. After being taken to the operating room general anesthesia was initiated and the patient  was orally intubated. The video fiberoptic bronchoscope was introduced via the endotracheal tube and a general inspection was performed which showed normal right and left lung anatomy,  aspiration of the bilateral mainstems was completed to remove any remaining secretions. Robotic catheter inserted into patient's endotracheal tube.   Target #1 RLL: The distinct navigation pathways prepared prior to this procedure were then utilized to navigate to patient's lesion identified on CT scan. The robotic catheter was secured into place and the vision probe was withdrawn.  Lesion location was approximated using fluoroscopy utilizing the CIOS spin 3D C-arm to correct for CT to body divergence. Under fluoroscopic guidance transbronchial needle biopsies and transbronchial forceps biopsies were performed to be sent for cytology and culture.  At the end of the procedure a general airway inspection was performed and there was no evidence of active bleeding. A bronchioalveolar lavage was performed in the RLL and sent for culture. The bronchoscope was removed.  The patient tolerated the procedure well. There was no significant blood loss and there were no obvious complications. A post-procedural chest x-ray is pending.  Samples Target #1: 1. Transbronchial needle biopsy from RLL nodule 2. Transbronchial forceps biopsies from RLL nodule 3. Bronchoalveolar lavage from RLL  Plans:  The patient will be discharged from the PACU to home when recovered from anesthesia and after chest x-ray is reviewed. We will review the cytology, pathology and microbiology results with the patient when they become available. Outpatient followup will be with me.  Raechel Chute, MD [facility] 07/18/2023 8:37 AM

## 2023-07-19 LAB — CYTOLOGY - NON PAP

## 2023-07-19 LAB — ACID FAST SMEAR (AFB, MYCOBACTERIA)
Acid Fast Smear: NEGATIVE
Acid Fast Smear: NEGATIVE

## 2023-07-20 LAB — CULTURE, BAL-QUANTITATIVE W GRAM STAIN
Culture: NO GROWTH
Gram Stain: NONE SEEN

## 2023-07-20 LAB — FUNGUS CULTURE RESULT

## 2023-07-22 ENCOUNTER — Encounter (HOSPITAL_COMMUNITY): Payer: Self-pay | Admitting: Student in an Organized Health Care Education/Training Program

## 2023-07-23 LAB — AEROBIC/ANAEROBIC CULTURE W GRAM STAIN (SURGICAL/DEEP WOUND)
Culture: NO GROWTH
Culture: NO GROWTH
Gram Stain: NONE SEEN
Gram Stain: NONE SEEN

## 2023-07-25 ENCOUNTER — Other Ambulatory Visit (HOSPITAL_COMMUNITY): Payer: Self-pay | Admitting: Student in an Organized Health Care Education/Training Program

## 2023-07-25 DIAGNOSIS — C7A8 Other malignant neuroendocrine tumors: Secondary | ICD-10-CM

## 2023-08-15 LAB — FUNGUS CULTURE WITH STAIN

## 2023-08-15 LAB — FUNGAL ORGANISM REFLEX

## 2023-08-15 LAB — FUNGUS CULTURE RESULT

## 2023-08-30 LAB — ACID FAST CULTURE WITH REFLEXED SENSITIVITIES (MYCOBACTERIA)
Acid Fast Culture: NEGATIVE
Acid Fast Culture: NEGATIVE

## 2023-10-29 ENCOUNTER — Encounter: Payer: Self-pay | Admitting: Urology

## 2023-11-13 DIAGNOSIS — N2889 Other specified disorders of kidney and ureter: Secondary | ICD-10-CM

## 2023-11-13 HISTORY — DX: Other specified disorders of kidney and ureter: N28.89

## 2023-12-09 ENCOUNTER — Ambulatory Visit
Admission: RE | Admit: 2023-12-09 | Discharge: 2023-12-09 | Disposition: A | Discharge status: Home / Self Care | Payer: Medicare Other | Source: Ambulatory Visit | Attending: Student in an Organized Health Care Education/Training Program | Admitting: Student in an Organized Health Care Education/Training Program

## 2023-12-09 DIAGNOSIS — C7A8 Other malignant neuroendocrine tumors: Secondary | ICD-10-CM | POA: Diagnosis present

## 2023-12-16 ENCOUNTER — Telehealth: Payer: Self-pay | Admitting: Student in an Organized Health Care Education/Training Program

## 2023-12-16 NOTE — Telephone Encounter (Signed)
Results are in the patients chart.

## 2023-12-16 NOTE — Telephone Encounter (Signed)
Patient would like to know the results of his CT scan taken on January 27th once they are released. [phone]

## 2023-12-17 ENCOUNTER — Other Ambulatory Visit (INDEPENDENT_AMBULATORY_CARE_PROVIDER_SITE_OTHER): Payer: Self-pay | Admitting: Vascular Surgery

## 2023-12-17 DIAGNOSIS — I70211 Atherosclerosis of native arteries of extremities with intermittent claudication, right leg: Secondary | ICD-10-CM

## 2023-12-19 NOTE — H&P (View-Only) (Signed)
 MRN : [id number]  Sergio Callahan is a 73 y.o. ([birth year]) male who presents with chief complaint of check circulation.  History of Present Illness:   The patient returns to the office for followup and review status post angiogram with intervention on 05/02/2018.    Procedure: Percutaneous transluminal and plasty and stent placement left common iliac artery; "kissing balloon" technique    The patient notes some change in the left lower extremity symptoms (he has known degenerative back disease and his right leg symptoms have essentially been stable). Interval shortening of the patient's claudication distance with some mild rest pain symptoms. No new ulcers or wounds have occurred since the last visit.   There have been no significant changes to the patient's overall health care.   No documented history of amaurosis fugax or recent TIA symptoms. There are no recent neurological changes noted. No documented history of DVT, PE or superficial thrombophlebitis. The patient denies recent episodes of angina or shortness of breath.    ABI's Rt=1.19 and Lt=0.80  (previous ABI's Rt=1.10 and Lt=1.07).  Duplex ultrasound of the left lower extremity obtained today shows a focal mid popliteal lesion greater than 75% with peak systolic velocities of 552 cm/s.   Previous duplex US of the aorta iliac arterial system shows the maximal aortic diameter is 2.8.  Bilateral common iliac artery stents are widely patent.    No outpatient medications have been marked as taking for the 12/23/23 encounter (Appointment) with Gilda Crease, Latina Craver, MD.    Past Medical History:  Diagnosis Date   Abdominal aortic aneurysm without rupture (HCC)    Anemia    takes B12 injections monthly   Anogenital (venereal) warts    Anxiety    takes Xanax daily as needed   Arthritis    back    Back pain    synovial cyst   Cancer (HCC)    Carpal tunnel  syndrome    surgery to correct, no current problems   Celiac artery atherosclerosis    Collagen vascular disease (HCC)    Hx Lupus.   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    Hx of adenomatous colonic polyps    Hyperlipidemia    takes Pravastatin nightly   Hypertension    takes Lisinopril-HCTZ and Metoprolol daily   Hyponatremia    Iliac artery aneurysm (HCC)    Lung nodule 06/2023   right lower lobe   Lupus (HCC)    takes Plaquenil and Imran daily   Nocturia    Numbness and tingling    left hip/leg/foot   Peripheral vascular disease (HCC)    Rotator cuff tendonitis    Urinary frequency  Past Surgical History:  Procedure Laterality Date   BACK SURGERY     BIOPSY  08/28/2021   Procedure: BIOPSY;  Surgeon: Meridee Score Netty Starring., MD;  Location: [facility];  Service: Gastroenterology;;   BRONCHIAL BIOPSY  07/18/2023   Procedure: BRONCHIAL BIOPSIES;  Surgeon: Raechel Chute, MD;  Location: [facility] ENDOSCOPY;  Service: Pulmonary;;   BRONCHIAL NEEDLE ASPIRATION BIOPSY  07/18/2023   Procedure: BRONCHIAL NEEDLE ASPIRATION BIOPSIES;  Surgeon: Raechel Chute, MD;  Location: [facility]OS[facility];  Service: Pulmonary;;   BRONCHIAL WASHINGS  07/18/2023   Procedure: BRONCHIAL WASHINGS;  Surgeon: Raechel Chute, MD;  Location: [facility];  Service: Pulmonary;;   BROW LIFT Bilateral 01/27/2021   Procedure: BLEPHAROPLASTY UPPER EYELID; W/EXCESS SKIN BROW PTOSIS REPAIR AND BLEPHAROPTOSIS REPAIR; RESECT EX BILATERAL;  Surgeon: Imagene Riches, MD;  Location: [facility];  Service: Ophthalmology;  Laterality: Bilateral;   CARDIAC CATHETERIZATION  2012    COLONOSCOPY     COLONOSCOPY WITH PROPOFOL N/A 09/30/2019   Procedure: COLONOSCOPY WITH PROPOFOL;  Surgeon: Earline Mayotte, MD;  Location: [facility] ENDOSCOPY;  Service: Endoscopy;  Laterality: N/A;   COLONOSCOPY WITH PROPOFOL N/A 05/05/2021   Procedure: COLONOSCOPY WITH PROPOFOL;  Surgeon: Regis Bill, MD;  Location: [facility] ENDOSCOPY;  Service: Endoscopy;  Laterality: N/A;   CORONARY ANGIOPLASTY     1 stent   ESOPHAGOGASTRODUODENOSCOPY     ESOPHAGOGASTRODUODENOSCOPY (EGD) WITH PROPOFOL N/A 05/05/2021   Procedure: ESOPHAGOGASTRODUODENOSCOPY (EGD) WITH PROPOFOL;  Surgeon: Regis Bill, MD;  Location: [facility] ENDOSCOPY;  Service: Endoscopy;  Laterality: N/A;   ESOPHAGOGASTRODUODENOSCOPY (EGD) WITH PROPOFOL N/A 08/28/2021   Procedure: ESOPHAGOGASTRODUODENOSCOPY (EGD) WITH PROPOFOL;  Surgeon: Meridee Score Netty Starring., MD;  Location: [facility] ENDOSCOPY;  Service: Gastroenterology;  Laterality: N/A;   ESOPHAGOGASTRODUODENOSCOPY (EGD) WITH PROPOFOL N/A 08/17/2022   Procedure: ESOPHAGOGASTRODUODENOSCOPY (EGD) WITH PROPOFOL;  Surgeon: Regis Bill, MD;  Location: [facility] ENDOSCOPY;  Service: Endoscopy;  Laterality: N/A;   EUS N/A 08/28/2021   Procedure: UPPER ENDOSCOPIC ULTRASOUND (EUS) RADIAL;  Surgeon: Lemar Lofty., MD;  Location: [facility] ENDOSCOPY;  Service: Gastroenterology;  Laterality: N/A;   LUMBAR LAMINECTOMY/DECOMPRESSION MICRODISCECTOMY Left 02/03/2015   Procedure: LUMBAR LAMINECTOMY/DECOMPRESSION MICRODISCECTOMY LEFT LUMBAR FOUR-FIVE WITH REMOVAL OF SYNOVIAL CYST;  Surgeon: Tressie Stalker, MD;  Location: [facility];  Service: Neurosurgery;  Laterality: Left;   NASAL SEPTOPLASTY W/ TURBINOPLASTY Bilateral 10/23/2019   Procedure: NASAL SEPTOPLASTY WITH SUBMUCOUS RESECTION OF TURBINATE;  Surgeon: Linus Salmons, MD;  Location: [facility];  Service: ENT;  Laterality: Bilateral;   PELVIC ANGIOGRAPHY N/A 05/02/2018   Procedure: PELVIC ANGIOGRAPHY;  Surgeon: Renford Dills, MD;  Location: [facility] INV[facility]IVE CV [facility];  Service: Cardiovascular;  Laterality: N/A;   POLYPECTOMY  08/28/2021   Procedure: POLYPECTOMY;  Surgeon: Meridee Score Netty Starring., MD;  Location: [facility];  Service: Gastroenterology;;   SHOULDER ARTHROSCOPY WITH SUBACROMIAL DECOMPRESSION AND BICEP TENDON REPAIR Right 12/04/2018   Procedure: SHOULDER ARTHROSCOPY WITH DEBRIDEMENT, DECOMPRESSION, BICEP TENODESIS;  Surgeon: Christena Flake, MD;  Location: [facility] ORS;  Service: Orthopedics;  Laterality: Right;   SUBMUCOSAL INJECTION  08/28/2021   Procedure: SUBMUCOSAL INJECTION;  Surgeon: Meridee Score Netty Starring., MD;  Location: [facility] END[facility];  Service: Gastroenterology;;    Social History Social History   Tobacco Use   Smoking status: Former    Current packs/day: 0.00    Average packs/day: 1.5 packs/day for 30.0 years (45.0 ttl pk-yrs)    Types: Cigarettes    Start date: 04/01/1991    Quit date: 03/31/2021    Years since quitting: 2.7  Smokeless tobacco: Never  Vaping Use   Vaping status: Never Used  Substance Use Topics   Alcohol use: Yes    Alcohol/week: 14.0 standard drinks of alcohol    Types: 14 Standard drinks or equivalent per week    Comment: cocktails about 2 daily   Drug use: No    Family History Family History  Problem Relation Age of Onset   Leukemia Mother        dx 47s   Hyperlipidemia Father    Hypertension Father    Diabetes Father    Colon cancer Father        dx 19s   Hearing loss Father    Vascular Disease Father    Lung cancer Brother        d. 48    Allergies  Allergen Reactions   Fosamax [Alendronate Sodium]     Leg pain   Percocet [Oxycodone-Acetaminophen] Nausea And Vomiting     REVIEW OF SYSTEMS (Negative unless checked)  Constitutional: [] Weight loss  [] Fever  [] Chills Cardiac: [] Chest pain   [] Chest pressure   [] Palpitations   [] Shortness of breath when laying flat   [] Shortness of breath with exertion. Vascular:  [x] Pain in legs with  walking   [] Pain in legs at rest  [] History of DVT   [] Phlebitis   [] Swelling in legs   [] Varicose veins   [] Non-healing ulcers Pulmonary:   [] Uses home oxygen   [] Productive cough   [] Hemoptysis   [] Wheeze  [] COPD   [] Asthma Neurologic:  [] Dizziness   [] Seizures   [] History of stroke   [] History of TIA  [] Aphasia   [] Vissual changes   [] Weakness or numbness in arm   [] Weakness or numbness in leg Musculoskeletal:   [] Joint swelling   [x] Joint pain   [] Low back pain Hematologic:  [] Easy bruising  [] Easy bleeding   [] Hypercoagulable state   [] Anemic Gastrointestinal:  [] Diarrhea   [] Vomiting  [x] Gastroesophageal reflux/heartburn   [] Difficulty swallowing. Genitourinary:  [] Chronic kidney disease   [] Difficult urination  [] Frequent urination   [] Blood in urine Skin:  [] Rashes   [] Ulcers  Psychological:  [] History of anxiety   []  History of major depression.  Physical Examination  There were no vitals filed for this visit. There is no height or weight on file to calculate BMI. Gen: WD/WN, NAD Head: [place]/AT, No temporalis wasting.  Ear/Nose/Throat: Hearing grossly intact, nares w/o erythema or drainage Eyes: PER, EOMI, sclera nonicteric.  Neck: Supple, no masses.  No bruit or JVD.  Pulmonary:  Good air movement, no audible wheezing, no use of accessory muscles.  Cardiac: RRR, normal S1, S2, no Murmurs. Vascular:  mild trophic changes, no open wounds Vessel Right Left  Radial Palpable Palpable  PT Palpable Not Palpable  DP Palpable Not Palpable  Gastrointestinal: soft, non-distended. No guarding/no peritoneal signs.  Musculoskeletal: M/S 5/5 throughout.  No visible deformity.  Neurologic: CN 2-12 intact. Pain and light touch intact in extremities.  Symmetrical.  Speech is fluent. Motor exam as listed above. Psychiatric: Judgment intact, Mood & affect appropriate for pt's clinical situation. Dermatologic: No rashes or ulcers noted.  No changes consistent with cellulitis.   CBC Lab Results   Component Value Date   WBC 6.1 04/22/2023   HGB 15.5 04/22/2023   HCT 46.2 04/22/2023   MCV 96.3 04/22/2023   PLT 251 04/22/2023    BMET    Component Value Date/Time   NA 135 04/22/2023 1519   K 3.5 04/22/2023 1519   CL 98 04/22/2023  1519   CO2 24 04/22/2023 1519   GLUCOSE 107 (H) 04/22/2023 1519   BUN 18 04/22/2023 1519   CREATININE 1.13 04/22/2023 1519   CALCIUM 9.1 04/22/2023 1519   GFRNONAA >60 04/22/2023 1519   GFRAA >60 05/02/2018 1209   CrCl cannot be calculated (Patient's most recent lab result is older than the maximum 21 days allowed.).  COAG No results found for: "INR", "PROTIME"  Radiology CT CHEST WO CONTRAST Result Date: 12/16/2023 CLINICAL DATA:  Follow-up pulmonary nodule EXAM: CT CHEST WITHOUT CONTRAST TECHNIQUE: Multidetector CT imaging of the chest was performed following the standard protocol without IV contrast. RADIATION DOSE REDUCTION: This exam was performed according to the departmental dose-optimization program which includes automated exposure control, adjustment of the mA and/or kV according to patient size and/or use of iterative reconstruction technique. COMPARISON:  CT chest 07/17/2023 FINDINGS: Cardiovascular: Normal heart size. No pericardial effusion. Advanced coronary artery and aortic atherosclerotic calcification. Mediastinum/Nodes: Trachea and esophagus are unremarkable. No thoracic adenopathy. Lungs/Pleura: No focal consolidation, pleural effusion, or pneumothorax. Multiple pulmonary nodules are again seen. The cavitary spiculated nodule in the right lower lobe measures 10 x 7 mm and is not substantially changed from 07/17/2023. (Series 4/image 90). Stable 3 mm nodule in the right lower lobe (series 4/image 131), 4 mm right middle lobe nodule (series 4/image 76) and 4 mm right upper lobe nodule (series 4/image 53). Upper Abdomen: Hepatic steatosis.  No acute abnormality. Musculoskeletal: No acute fracture. IMPRESSION: 1. Stable cavitary spiculated  nodule in the right lower lobe measuring 10 x 7 mm. Additional pulmonary nodules are also stable dating back to 07/27/2022. 2. Hepatic steatosis. 3.  Aortic Atherosclerosis (ICD10-I70.0). Electronically Signed   By: Minerva Fester M.D.   On: 12/16/2023 00:56     Assessment/Plan 1. Atherosclerosis of native artery of right lower extremity with intermittent claudication (HCC) (Primary) Recommend:  The patient has experienced increased claudication symptoms and is now describing lifestyle limiting claudication and appears to be having mild rest pain symptroms.  Given the severity of the patient's severe left lower extremity symptoms the patient should undergo angiography with the hope for intervention.  Risk and benefits were reviewed the patient.  Indications for the procedure were reviewed.  All questions were answered, the patient agrees to proceed with left lower extremity angiography and possible intervention.   The patient should continue walking and begin a more formal exercise program.  The patient should continue antiplatelet therapy and aggressive treatment of the lipid abnormalities  The patient will follow up with me after the angiogram.   2. Infrarenal abdominal aortic aneurysm (AAA) without rupture (HCC) No surgery or intervention at this time. The patient has an asymptomatic abdominal aortic aneurysm that is less than 4 cm in maximal diameter.  I have discussed the natural history of abdominal aortic aneurysm and the small risk of rupture for aneurysm less than 5 cm in size.  However, as these small aneurysms tend to enlarge over time, continued surveillance with ultrasound or CT scan is mandatory.  I have also discussed optimizing medical management with hypertension and lipid control and the importance of abstinence from tobacco.  The patient is also encouraged to exercise a minimum of 30 minutes 4 times a week.  Should the patient develop new onset abdominal or back pain or signs  of peripheral embolization they are instructed to seek medical attention immediately and to alert the physician providing care that they have an aneurysm.  The patient voices their understanding. The patient  will return in 24 months with an aortic duplex.   3. Iliac artery aneurysm (HCC) No surgery or intervention at this time. The patient has an asymptomatic abdominal aortic aneurysm that is less than 4 cm in maximal diameter.  I have discussed the natural history of abdominal aortic aneurysm and the small risk of rupture for aneurysm less than 5 cm in size.  However, as these small aneurysms tend to enlarge over time, continued surveillance with ultrasound or CT scan is mandatory.  I have also discussed optimizing medical management with hypertension and lipid control and the importance of abstinence from tobacco.  The patient is also encouraged to exercise a minimum of 30 minutes 4 times a week.  Should the patient develop new onset abdominal or back pain or signs of peripheral embolization they are instructed to seek medical attention immediately and to alert the physician providing care that they have an aneurysm.  The patient voices their understanding. The patient will return in 24 months with an aortic duplex.   4. Coronary arteriosclerosis in native artery Continue cardiac and antihypertensive medications as already ordered and reviewed, no changes at this time.  Continue statin as ordered and reviewed, no changes at this time  Nitrates PRN for chest pain  5. Essential hypertension Continue antihypertensive medications as already ordered, these medications have been reviewed and there are no changes at this time.  6. Hyperlipidemia, unspecified hyperlipidemia type Continue statin as ordered and reviewed, no changes at this time    Levora Dredge, MD  12/19/2023 12:35 PM

## 2023-12-19 NOTE — Progress Notes (Signed)
 MRN : [id number]  Sergio Callahan is a 73 y.o. ([birth year]) male who presents with chief complaint of check circulation.  History of Present Illness:   The patient returns to the office for followup and review status post angiogram with intervention on 05/02/2018.    Procedure: Percutaneous transluminal and plasty and stent placement left common iliac artery; "kissing balloon" technique    The patient notes some change in the left lower extremity symptoms (he has known degenerative back disease and his right leg symptoms have essentially been stable). Interval shortening of the patient's claudication distance with some mild rest pain symptoms. No new ulcers or wounds have occurred since the last visit.   There have been no significant changes to the patient's overall health care.   No documented history of amaurosis fugax or recent TIA symptoms. There are no recent neurological changes noted. No documented history of DVT, PE or superficial thrombophlebitis. The patient denies recent episodes of angina or shortness of breath.    ABI's Rt=1.19 and Lt=0.80  (previous ABI's Rt=1.10 and Lt=1.07).  Duplex ultrasound of the left lower extremity obtained today shows a focal mid popliteal lesion greater than 75% with peak systolic velocities of 552 cm/s.   Previous duplex US of the aorta iliac arterial system shows the maximal aortic diameter is 2.8.  Bilateral common iliac artery stents are widely patent.    No outpatient medications have been marked as taking for the 12/23/23 encounter (Appointment) with Gilda Crease, Latina Craver, MD.    Past Medical History:  Diagnosis Date   Abdominal aortic aneurysm without rupture (HCC)    Anemia    takes B12 injections monthly   Anogenital (venereal) warts    Anxiety    takes Xanax daily as needed   Arthritis    back    Back pain    synovial cyst   Cancer (HCC)    Carpal tunnel  syndrome    surgery to correct, no current problems   Celiac artery atherosclerosis    Collagen vascular disease (HCC)    Hx Lupus.   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    Hx of adenomatous colonic polyps    Hyperlipidemia    takes Pravastatin nightly   Hypertension    takes Lisinopril-HCTZ and Metoprolol daily   Hyponatremia    Iliac artery aneurysm (HCC)    Lung nodule 06/2023   right lower lobe   Lupus (HCC)    takes Plaquenil and Imran daily   Nocturia    Numbness and tingling    left hip/leg/foot   Peripheral vascular disease (HCC)    Rotator cuff tendonitis    Urinary frequency  Past Surgical History:  Procedure Laterality Date   BACK SURGERY     BIOPSY  08/28/2021   Procedure: BIOPSY;  Surgeon: Meridee Score Netty Starring., MD;  Location: [facility];  Service: Gastroenterology;;   BRONCHIAL BIOPSY  07/18/2023   Procedure: BRONCHIAL BIOPSIES;  Surgeon: Raechel Chute, MD;  Location: [facility] ENDOSCOPY;  Service: Pulmonary;;   BRONCHIAL NEEDLE ASPIRATION BIOPSY  07/18/2023   Procedure: BRONCHIAL NEEDLE ASPIRATION BIOPSIES;  Surgeon: Raechel Chute, MD;  Location: [facility] ENDOSCOPY;  Service: Pulmonary;;   BRONCHIAL WASHINGS  07/18/2023   Procedure: BRONCHIAL WASHINGS;  Surgeon: Raechel Chute, MD;  Location: [facility] END[facility]COPY;  Service: Pulmonary;;   BROW LIFT Bilateral 01/27/2021   Procedure: BLEPHAROPLASTY UPPER EYELID; W/EXCESS SKIN BROW PTOSIS REPAIR AND BLEPHAROPTOSIS REPAIR; RESECT EX BILATERAL;  Surgeon: Imagene Riches, MD;  Location: [facility];  Service: Ophthalmology;  Laterality: Bilateral;   CARDIAC CATHETERIZATION  2012    COLONOSCOPY     COLONOSCOPY WITH PROPOFOL N/A 09/30/2019   Procedure: COLONOSCOPY WITH PROPOFOL;  Surgeon: Earline Mayotte, MD;  Location: [facility] ENDOSCOPY;  Service: Endoscopy;  Laterality: N/A;   COLONOSCOPY WITH PROPOFOL N/A 05/05/2021   Procedure: COLONOSCOPY WITH PROPOFOL;  Surgeon: Regis Bill, MD;  Location: [facility] ENDOSCOPY;  Service: Endoscopy;  Laterality: N/A;   CORONARY ANGIOPLASTY     1 stent   ESOPHAGOGASTRODUODENOSCOPY     ESOPHAGOGASTRODUODENOSCOPY (EGD) WITH PROPOFOL N/A 05/05/2021   Procedure: ESOPHAGOGASTRODUODENOSCOPY (EGD) WITH PROPOFOL;  Surgeon: Regis Bill, MD;  Location: [facility] ENDOSCOPY;  Service: Endoscopy;  Laterality: N/A;   ESOPHAGOGASTRODUODENOSCOPY (EGD) WITH PROPOFOL N/A 08/28/2021   Procedure: ESOPHAGOGASTRODUODENOSCOPY (EGD) WITH PROPOFOL;  Surgeon: Meridee Score Netty Starring., MD;  Location: [facility] ENDOSCOPY;  Service: Gastroenterology;  Laterality: N/A;   ESOPHAGOGASTRODUODENOSCOPY (EGD) WITH PROPOFOL N/A 08/17/2022   Procedure: ESOPHAGOGASTRODUODENOSCOPY (EGD) WITH PROPOFOL;  Surgeon: Regis Bill, MD;  Location: [facility] ENDOSCOPY;  Service: Endoscopy;  Laterality: N/A;   EUS N/A 08/28/2021   Procedure: UPPER ENDOSCOPIC ULTRASOUND (EUS) RADIAL;  Surgeon: Lemar Lofty., MD;  Location: [facility] ENDOSCOPY;  Service: Gastroenterology;  Laterality: N/A;   LUMBAR LAMINECTOMY/DECOMPRESSION MICRODISCECTOMY Left 02/03/2015   Procedure: LUMBAR LAMINECTOMY/DECOMPRESSION MICRODISCECTOMY LEFT LUMBAR FOUR-FIVE WITH REMOVAL OF SYNOVIAL CYST;  Surgeon: Tressie Stalker, MD;  Location: [facility]URO [facility]S;  Service: Neurosurgery;  Laterality: Left;   NASAL SEPTOPLASTY W/ TURBINOPLASTY Bilateral 10/23/2019   Procedure: NASAL SEPTOPLASTY WITH SUBMUCOUS RESECTION OF TURBINATE;  Surgeon: Linus Salmons, MD;  Location: [facility];  Service: ENT;  Laterality: Bilateral;   PELVIC ANGIOGRAPHY N/A 05/02/2018   Procedure: PELVIC ANGIOGRAPHY;  Surgeon: Renford Dills, MD;  Location: [facility] INVASIVE CV LAB;  Service: Cardiovascular;  Laterality: N/A;   POLYPECTOMY  08/28/2021   Procedure: POLYPECTOMY;  Surgeon: Meridee Score Netty Starring., MD;  Location: [facility];  Service: Gastroenterology;;   SHOULDER ARTHROSCOPY WITH SUBACROMIAL DECOMPRESSION AND BICEP TENDON REPAIR Right 12/04/2018   Procedure: SHOULDER ARTHROSCOPY WITH DEBRIDEMENT, DECOMPRESSION, BICEP TENODESIS;  Surgeon: Christena Flake, MD;  Location: [facility] ORS;  Service: Orthopedics;  Laterality: Right;   SUBMUCOSAL INJECTION  08/28/2021   Procedure: SUBMUCOSAL INJECTION;  Surgeon: Meridee Score Netty Starring., MD;  Location: [facility] ENDOSCOPY;  Service: Gastroenterology;;    Social History Social History   Tobacco Use   Smoking status: Former    Current packs/day: 0.00    Average packs/day: 1.5 packs/day for 30.0 years (45.0 ttl pk-yrs)    Types: Cigarettes    Start date: 04/01/1991    Quit date: 03/31/2021    Years since quitting: 2.7  Smokeless tobacco: Never  Vaping Use   Vaping status: Never Used  Substance Use Topics   Alcohol use: Yes    Alcohol/week: 14.0 standard drinks of alcohol    Types: 14 Standard drinks or equivalent per week    Comment: cocktails about 2 daily   Drug use: No    Family History Family History  Problem Relation Age of Onset   Leukemia Mother        dx 47s   Hyperlipidemia Father    Hypertension Father    Diabetes Father    Colon cancer Father        dx 19s   Hearing loss Father    Vascular Disease Father    Lung cancer Brother        d. 48    Allergies  Allergen Reactions   Fosamax [Alendronate Sodium]     Leg pain   Percocet [Oxycodone-Acetaminophen] Nausea And Vomiting     REVIEW OF SYSTEMS (Negative unless checked)  Constitutional: [] Weight loss  [] Fever  [] Chills Cardiac: [] Chest pain   [] Chest pressure   [] Palpitations   [] Shortness of breath when laying flat   [] Shortness of breath with exertion. Vascular:  [x] Pain in legs with  walking   [] Pain in legs at rest  [] History of DVT   [] Phlebitis   [] Swelling in legs   [] Varicose veins   [] Non-healing ulcers Pulmonary:   [] Uses home oxygen   [] Productive cough   [] Hemoptysis   [] Wheeze  [] COPD   [] Asthma Neurologic:  [] Dizziness   [] Seizures   [] History of stroke   [] History of TIA  [] Aphasia   [] Vissual changes   [] Weakness or numbness in arm   [] Weakness or numbness in leg Musculoskeletal:   [] Joint swelling   [x] Joint pain   [] Low back pain Hematologic:  [] Easy bruising  [] Easy bleeding   [] Hypercoagulable state   [] Anemic Gastrointestinal:  [] Diarrhea   [] Vomiting  [x] Gastroesophageal reflux/heartburn   [] Difficulty swallowing. Genitourinary:  [] Chronic kidney disease   [] Difficult urination  [] Frequent urination   [] Blood in urine Skin:  [] Rashes   [] Ulcers  Psychological:  [] History of anxiety   []  History of major depression.  Physical Examination  There were no vitals filed for this visit. There is no height or weight on file to calculate BMI. Gen: WD/WN, NAD Head: [place]/AT, No temporalis wasting.  Ear/Nose/Throat: Hearing grossly intact, nares w/o erythema or drainage Eyes: PER, EOMI, sclera nonicteric.  Neck: Supple, no masses.  No bruit or JVD.  Pulmonary:  Good air movement, no audible wheezing, no use of accessory muscles.  Cardiac: RRR, normal S1, S2, no Murmurs. Vascular:  mild trophic changes, no open wounds Vessel Right Left  Radial Palpable Palpable  PT Palpable Not Palpable  DP Palpable Not Palpable  Gastrointestinal: soft, non-distended. No guarding/no peritoneal signs.  Musculoskeletal: M/S 5/5 throughout.  No visible deformity.  Neurologic: CN 2-12 intact. Pain and light touch intact in extremities.  Symmetrical.  Speech is fluent. Motor exam as listed above. Psychiatric: Judgment intact, Mood & affect appropriate for pt's clinical situation. Dermatologic: No rashes or ulcers noted.  No changes consistent with cellulitis.   CBC Lab Results   Component Value Date   WBC 6.1 04/22/2023   HGB 15.5 04/22/2023   HCT 46.2 04/22/2023   MCV 96.3 04/22/2023   PLT 251 04/22/2023    BMET    Component Value Date/Time   NA 135 04/22/2023 1519   K 3.5 04/22/2023 1519   CL 98 04/22/2023  1519   CO2 24 04/22/2023 1519   GLUCOSE 107 (H) 04/22/2023 1519   BUN 18 04/22/2023 1519   CREATININE 1.13 04/22/2023 1519   CALCIUM 9.1 04/22/2023 1519   GFRNONAA >60 04/22/2023 1519   GFRAA >60 05/02/2018 1209   CrCl cannot be calculated (Patient's most recent lab result is older than the maximum 21 days allowed.).  COAG No results found for: "INR", "PROTIME"  Radiology CT CHEST WO CONTRAST Result Date: 12/16/2023 CLINICAL DATA:  Follow-up pulmonary nodule EXAM: CT CHEST WITHOUT CONTRAST TECHNIQUE: Multidetector CT imaging of the chest was performed following the standard protocol without IV contrast. RADIATION DOSE REDUCTION: This exam was performed according to the departmental dose-optimization program which includes automated exposure control, adjustment of the mA and/or kV according to patient size and/or use of iterative reconstruction technique. COMPARISON:  CT chest 07/17/2023 FINDINGS: Cardiovascular: Normal heart size. No pericardial effusion. Advanced coronary artery and aortic atherosclerotic calcification. Mediastinum/Nodes: Trachea and esophagus are unremarkable. No thoracic adenopathy. Lungs/Pleura: No focal consolidation, pleural effusion, or pneumothorax. Multiple pulmonary nodules are again seen. The cavitary spiculated nodule in the right lower lobe measures 10 x 7 mm and is not substantially changed from 07/17/2023. (Series 4/image 90). Stable 3 mm nodule in the right lower lobe (series 4/image 131), 4 mm right middle lobe nodule (series 4/image 76) and 4 mm right upper lobe nodule (series 4/image 53). Upper Abdomen: Hepatic steatosis.  No acute abnormality. Musculoskeletal: No acute fracture. IMPRESSION: 1. Stable cavitary spiculated  nodule in the right lower lobe measuring 10 x 7 mm. Additional pulmonary nodules are also stable dating back to 07/27/2022. 2. Hepatic steatosis. 3.  Aortic Atherosclerosis (ICD10-I70.0). Electronically Signed   By: Minerva Fester M.D.   On: 12/16/2023 00:56     Assessment/Plan 1. Atherosclerosis of native artery of right lower extremity with intermittent claudication (HCC) (Primary) Recommend:  The patient has experienced increased claudication symptoms and is now describing lifestyle limiting claudication and appears to be having mild rest pain symptroms.  Given the severity of the patient's severe left lower extremity symptoms the patient should undergo angiography with the hope for intervention.  Risk and benefits were reviewed the patient.  Indications for the procedure were reviewed.  All questions were answered, the patient agrees to proceed with left lower extremity angiography and possible intervention.   The patient should continue walking and begin a more formal exercise program.  The patient should continue antiplatelet therapy and aggressive treatment of the lipid abnormalities  The patient will follow up with me after the angiogram.   2. Infrarenal abdominal aortic aneurysm (AAA) without rupture (HCC) No surgery or intervention at this time. The patient has an asymptomatic abdominal aortic aneurysm that is less than 4 cm in maximal diameter.  I have discussed the natural history of abdominal aortic aneurysm and the small risk of rupture for aneurysm less than 5 cm in size.  However, as these small aneurysms tend to enlarge over time, continued surveillance with ultrasound or CT scan is mandatory.  I have also discussed optimizing medical management with hypertension and lipid control and the importance of abstinence from tobacco.  The patient is also encouraged to exercise a minimum of 30 minutes 4 times a week.  Should the patient develop new onset abdominal or back pain or signs  of peripheral embolization they are instructed to seek medical attention immediately and to alert the physician providing care that they have an aneurysm.  The patient voices their understanding. The patient  will return in 24 months with an aortic duplex.   3. Iliac artery aneurysm (HCC) No surgery or intervention at this time. The patient has an asymptomatic abdominal aortic aneurysm that is less than 4 cm in maximal diameter.  I have discussed the natural history of abdominal aortic aneurysm and the small risk of rupture for aneurysm less than 5 cm in size.  However, as these small aneurysms tend to enlarge over time, continued surveillance with ultrasound or CT scan is mandatory.  I have also discussed optimizing medical management with hypertension and lipid control and the importance of abstinence from tobacco.  The patient is also encouraged to exercise a minimum of 30 minutes 4 times a week.  Should the patient develop new onset abdominal or back pain or signs of peripheral embolization they are instructed to seek medical attention immediately and to alert the physician providing care that they have an aneurysm.  The patient voices their understanding. The patient will return in 24 months with an aortic duplex.   4. Coronary arteriosclerosis in native artery Continue cardiac and antihypertensive medications as already ordered and reviewed, no changes at this time.  Continue statin as ordered and reviewed, no changes at this time  Nitrates PRN for chest pain  5. Essential hypertension Continue antihypertensive medications as already ordered, these medications have been reviewed and there are no changes at this time.  6. Hyperlipidemia, unspecified hyperlipidemia type Continue statin as ordered and reviewed, no changes at this time    Levora Dredge, MD  12/19/2023 12:35 PM

## 2023-12-23 ENCOUNTER — Ambulatory Visit (INDEPENDENT_AMBULATORY_CARE_PROVIDER_SITE_OTHER): Payer: Medicare Other

## 2023-12-23 ENCOUNTER — Ambulatory Visit (INDEPENDENT_AMBULATORY_CARE_PROVIDER_SITE_OTHER): Payer: Medicare Other | Admitting: Vascular Surgery

## 2023-12-23 ENCOUNTER — Other Ambulatory Visit (INDEPENDENT_AMBULATORY_CARE_PROVIDER_SITE_OTHER): Payer: Self-pay | Admitting: Vascular Surgery

## 2023-12-23 ENCOUNTER — Encounter (INDEPENDENT_AMBULATORY_CARE_PROVIDER_SITE_OTHER): Payer: Self-pay | Admitting: Vascular Surgery

## 2023-12-23 VITALS — BP 130/86 | HR 77 | Resp 16 | Wt 195.8 lb

## 2023-12-23 DIAGNOSIS — I2510 Atherosclerotic heart disease of native coronary artery without angina pectoris: Secondary | ICD-10-CM

## 2023-12-23 DIAGNOSIS — I70211 Atherosclerosis of native arteries of extremities with intermittent claudication, right leg: Secondary | ICD-10-CM

## 2023-12-23 DIAGNOSIS — I723 Aneurysm of iliac artery: Secondary | ICD-10-CM | POA: Diagnosis not present

## 2023-12-23 DIAGNOSIS — I7143 Infrarenal abdominal aortic aneurysm, without rupture: Secondary | ICD-10-CM | POA: Diagnosis not present

## 2023-12-23 DIAGNOSIS — I10 Essential (primary) hypertension: Secondary | ICD-10-CM

## 2023-12-23 DIAGNOSIS — E785 Hyperlipidemia, unspecified: Secondary | ICD-10-CM

## 2023-12-23 LAB — VAS US ABI WITH/WO TBI
Left ABI: 0.8
Right ABI: 1.19

## 2023-12-24 ENCOUNTER — Telehealth: Payer: Self-pay | Admitting: Student in an Organized Health Care Education/Training Program

## 2023-12-24 DIAGNOSIS — R911 Solitary pulmonary nodule: Secondary | ICD-10-CM

## 2023-12-24 NOTE — Telephone Encounter (Signed)
PT would like to move fwd with the procedure Dr. Aundria Rud recom to him yesterday. Please let Dr. Ashley Mariner ASAP as it was to be done Tue of week.  His # is [phone]

## 2023-12-24 NOTE — Telephone Encounter (Signed)
Patient has Medicare A & B and and a Medicare supplement. Prior Auth Not Required

## 2023-12-24 NOTE — Telephone Encounter (Signed)
Noted. Nothing further needed.

## 2023-12-24 NOTE — Telephone Encounter (Addendum)
Bronchoscopy has been scheduled for 12/31/2023 at 8:00am at [facility].  CT scheduled for 2/17 at 8:00am. PreAdmit appt is scheduled for 2/12 between 8a-1p.  The patient is aware and I have mailed him the information as well.

## 2023-12-24 NOTE — Telephone Encounter (Signed)
Robotic Bronchoscopy 12/31/2023 at 8:00am Lung Nodule [id number]  Synetta Fail please see bronch info.

## 2023-12-24 NOTE — Addendum Note (Signed)
Addended byRaechel Chute on: 12/24/2023 12:21 PM   Modules accepted: Orders

## 2023-12-25 ENCOUNTER — Other Ambulatory Visit: Payer: Self-pay

## 2023-12-25 ENCOUNTER — Encounter
Admission: RE | Admit: 2023-12-25 | Discharge: 2023-12-25 | Disposition: A | Discharge status: Home / Self Care | Payer: Medicare Other | Source: Ambulatory Visit | Attending: Student in an Organized Health Care Education/Training Program | Admitting: Student in an Organized Health Care Education/Training Program

## 2023-12-25 VITALS — Ht 72.0 in | Wt 198.0 lb

## 2023-12-25 DIAGNOSIS — E785 Hyperlipidemia, unspecified: Secondary | ICD-10-CM

## 2023-12-25 DIAGNOSIS — I10 Essential (primary) hypertension: Secondary | ICD-10-CM

## 2023-12-25 DIAGNOSIS — I70211 Atherosclerosis of native arteries of extremities with intermittent claudication, right leg: Secondary | ICD-10-CM

## 2023-12-25 NOTE — Patient Instructions (Addendum)
Your procedure is scheduled on: Tuesday 12/31/23 Report to the Registration Desk on the 1st floor of the [facility]. To find out your arrival time, please call [phone] between 1PM - 3PM on: Monday 12/30/23 If your arrival time is 6:00 am, do not arrive before that time as the [facility] entrance doors do not open until 6:00 am.  REMEMBER: Instructions that are not followed completely may result in serious medical risk, up to and including death; or upon the discretion of your surgeon and anesthesiologist your surgery may need to be rescheduled.  Do not eat food after midnight the night before surgery.  No gum chewing or hard candies.   One week prior to surgery: Stop Anti-inflammatories (NSAIDS) such as Advil, Aleve, Ibuprofen, Motrin, Naproxen, Naprosyn and Aspirin based products such as Excedrin, Goody's Powder, BC Powder. Stop ANY OVER THE COUNTER supplements until after surgery.  You may however, continue to take Tylenol if needed for pain up until the day of surgery.  Do not use tadalafil (CIALIS) 20 MG tablet  48 hours prior to surgery.  Continue taking all of your other prescription medications up until the day of surgery.  ON THE DAY OF SURGERY ONLY TAKE THESE MEDICATIONS WITH SIPS OF WATER:  ALPRAZolam (XANAX) 0.5 MG  amLODipine (NORVASC) 2.5 MG  azaTHIOprine (IMURAN) 50 MG hydroxychloroquine (PLAQUENIL) 200 MG  omeprazole (PRILOSEC) 20 MG  amsulosin (FLOMAX) 0.4 MG   Use inhalers on the day of surgery and bring to the hospital. triamcinolone (NASACORT) 55 MCG/ACT AERO nasal inhaler   No Alcohol for 24 hours before or after surgery.  No Smoking including e-cigarettes for 24 hours before surgery.  No chewable tobacco products for at least 6 hours before surgery.  No nicotine patches on the day of surgery.  Do not use any "recreational" drugs for at least a week (preferably 2 weeks) before your surgery.  Please be advised that the combination of cocaine and  anesthesia may have negative outcomes, up to and including death. If you test positive for cocaine, your surgery will be cancelled.  On the morning of surgery brush your teeth with toothpaste and water, you may rinse your mouth with mouthwash if you wish. Do not swallow any toothpaste or mouthwash.  Take a fresh shower in the morning before coming to the hospital day of surgery.  Do not wear jewelry, make-up, hairpins, clips or nail polish.  For welded (permanent) jewelry: bracelets, anklets, waist bands, etc.  Please have this removed prior to surgery.  If it is not removed, there is a chance that hospital personnel will need to cut it off on the day of surgery.  Do not wear lotions, powders, or perfumes.   Do not shave body hair from the neck down 48 hours before surgery.  Contact lenses, hearing aids and dentures may not be worn into surgery.  Do not bring valuables to the hospital. [facility] is not responsible for any missing/lost belongings or valuables.   Notify your doctor if there is any change in your medical condition (cold, fever, infection).  Wear comfortable clothing (specific to your surgery type) to the hospital.  After surgery, you can help prevent lung complications by doing breathing exercises.  Take deep breaths and cough every 1-2 hours. Your doctor may order a device called an Incentive Spirometer to help you take deep breaths. When coughing or sneezing, hold a pillow firmly against your incision with both hands. This is called "splinting." Doing this helps protect  your incision. It also decreases belly discomfort.  If you are being admitted to the hospital overnight, leave your suitcase in the car. After surgery it may be brought to your room.  In case of increased patient census, it may be necessary for you, the patient, to continue your postoperative care in the Same Day Surgery department.  If you are being discharged the day of surgery, you will not be  allowed to drive home. You will need a responsible individual to drive you home and stay with you for 24 hours after surgery.   If you are taking public transportation, you will need to have a responsible individual with you.  Please call the Pre-admissions Testing Dept. at [phone] if you have any questions about these instructions.  Surgery Visitation Policy:  Patients having surgery or a procedure may have two visitors.  Children under the age of 41 must have an adult with them who is not the patient.  Temporary Visitor Restrictions Due to increasing cases of flu, RSV and COVID-19: Children ages 51 and under will not be able to visit patients in [facility] hospitals under most circumstances.  Inpatient Visitation:    Visiting hours are 7 a.m. to 8 p.m. Up to four visitors are allowed at one time in a patient room. The visitors may rotate out with other people during the day.  One visitor age 76 or older may stay with the patient overnight and must be in the room by 8 p.m.

## 2023-12-26 ENCOUNTER — Telehealth (INDEPENDENT_AMBULATORY_CARE_PROVIDER_SITE_OTHER): Payer: Self-pay

## 2023-12-26 ENCOUNTER — Encounter
Admission: RE | Admit: 2023-12-26 | Discharge: 2023-12-26 | Disposition: A | Discharge status: Home / Self Care | Payer: Medicare Other | Source: Ambulatory Visit | Attending: Student in an Organized Health Care Education/Training Program | Admitting: Student in an Organized Health Care Education/Training Program

## 2023-12-26 DIAGNOSIS — I498 Other specified cardiac arrhythmias: Secondary | ICD-10-CM | POA: Insufficient documentation

## 2023-12-26 DIAGNOSIS — I10 Essential (primary) hypertension: Secondary | ICD-10-CM | POA: Diagnosis not present

## 2023-12-26 DIAGNOSIS — E785 Hyperlipidemia, unspecified: Secondary | ICD-10-CM | POA: Diagnosis not present

## 2023-12-26 DIAGNOSIS — Z01810 Encounter for preprocedural cardiovascular examination: Secondary | ICD-10-CM | POA: Insufficient documentation

## 2023-12-26 DIAGNOSIS — I70211 Atherosclerosis of native arteries of extremities with intermittent claudication, right leg: Secondary | ICD-10-CM | POA: Insufficient documentation

## 2023-12-26 NOTE — Telephone Encounter (Addendum)
I attempted to contact the patient to give him the information regarding his left leg angio with Dr. Gilda Crease on 01/10/24 with a 8:00 am arrival time to the [facility]. Pre-procedure instructions will be sent to Mychart and mailed. Patient returned my call and pre-procedure information was discussed.

## 2023-12-30 ENCOUNTER — Telehealth: Payer: Self-pay | Admitting: Student in an Organized Health Care Education/Training Program

## 2023-12-30 ENCOUNTER — Encounter: Payer: Self-pay | Admitting: Student in an Organized Health Care Education/Training Program

## 2023-12-30 ENCOUNTER — Ambulatory Visit
Admission: RE | Admit: 2023-12-30 | Discharge: 2023-12-30 | Disposition: A | Discharge status: Home / Self Care | Payer: Medicare Other | Source: Ambulatory Visit | Attending: Student in an Organized Health Care Education/Training Program | Admitting: Student in an Organized Health Care Education/Training Program

## 2023-12-30 DIAGNOSIS — R911 Solitary pulmonary nodule: Secondary | ICD-10-CM | POA: Diagnosis present

## 2023-12-30 NOTE — Telephone Encounter (Signed)
 Patient is returning a missed call.

## 2023-12-30 NOTE — Progress Notes (Signed)
 Perioperative / Anesthesia Services  Pre-Admission Testing Clinical Review / Pre-Operative Anesthesia Consult  Date: 12/30/23  Patient Demographics:  Name: Sergio Callahan DOB: [birth year] MRN:   [id number]  Planned Surgical Procedure(s):    Case: [id number] Date/Time: 12/31/23 1300   Procedure: ROBOTIC ASSISTED NAVIGATIONAL BRONCHOSCOPY (Bilateral)   Anesthesia type: General   Diagnosis: Lung nodule [R91.1]   Pre-op diagnosis: pulmonary nodule   Location: [facility] PROCEDURE RM 02 / [facility]S FOR ANESTHESIA [facility]   Surgeons: Raechel Chute, MD   NOTE: Available PAT nursing documentation and vital signs have been reviewed. Clinical nursing staff has updated patient's PMH/PSHx, current medication list, and drug allergies/intolerances to ensure comprehensive history available to assist in medical decision making as it pertains to the aforementioned surgical procedure and anticipated anesthetic course. Extensive review of available clinical information personally performed. [place] [facility] and PSHx updated with any diagnoses/procedures that  may have been inadvertently omitted during his intake with the pre-admission testing department's nursing staff.  Clinical Discussion:  Sergio Callahan is a 73 y.o. male who is submitted for pre-surgical anesthesia review and clearance prior to him undergoing the above procedure. Patient is a Current Smoker (45 pack years). 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  Patient is followed by cardiology Juliann Pares, MD). He was last seen in the cardiology clinic on 11/04/2023; notes reviewed. At the time of his clinic visit,  patient doing well overall from a cardiovascular perspective.  Patient with episodes of generalized weakness, increased fatigue, and episodes of hypotension.  Patient denied any chest pain, shortness of breath, PND, orthopnea, palpitations, significant peripheral edema, vertiginous symptoms, or presyncope/syncope. Patient with a past medical history significant for cardiovascular diagnoses. Documented physical exam was grossly benign, providing no evidence of acute exacerbation and/or decompensation of the patient's known cardiovascular conditions.  Patient underwent diagnostic LEFT heart catheterization on 12/08/2010 oh at [facility].  Study revealed significant single-vessel CAD, with a 99% lesion in the proximal LAD.  Rotational atherectomy was performed followed by PCI placing a 3.0 x 23 mm Xience DES x 1 to the proximal LAD lesion yielding excellent angiographic result and TIMI-3 flow.  Most recent myocardial perfusion imaging study was performed on 02/14/2018 revealing a normal left trickle systolic function with an EF of 54%.  There were no regional wall motion abnormalities.  SPECT images demonstrated no evidence of stress-induced myocardial ischemia or arrhythmia; no scintigraphic evidence of scar.  Left ventricular internal cavity size was normal throughout the study.  Study determined to be normal and low risk overall.  Blood pressure well controlled at 124/72 mmHg on currently prescribed CCB (amlodipine), diuretic (HCTZ + spironolactone), and beta-blocker (metoprolol succinate) therapies.  Patient is on pravastatin for his HLD diagnosis and ASCVD prevention.  Patient has a supply of short acting nitrates (NTG) to use on a as needed basis for recurrent angina/anginal equivalent symptoms; denied recent use. In the setting of known cardiovascular diagnoses, it is important note that patient is on a PDE5i medication (tadalafil) for and erectile dysfunction diagnosis. Patient is not  diabetic. He does not have an OSAH diagnosis. Patient is able to complete all of his  ADL/IADLs without cardiovascular limitation.  Per the DASI, patient is able  to achieve at least 4 METS of physical activity without experiencing any significant degree of angina/anginal equivalent symptoms.  Given his reported hypotension, antihypertensive medications were decreased.  No other changes were made to his medication regimen during this visit with cardiology. Patient scheduled to follow-up with outpatient cardiology in 1 year or sooner if needed.  Hortencia Conradi underwent D PET back in 06/2021, at which time incidental finding made of pulmonary nodules.  Subsequent imaging has revealed multiple pulmonary nodules, with 1 particular being of concern.  Super D chest CT imaging performed on 07/17/2023 demonstrated a spiculated morphology of a concerning nodule in the RIGHT lower pulmonary lobe.  Repeat CT imaging of the chest performed on 12/09/2023 revealed that the area of concern now was cavitary and spiculated measuring 10 x 7 mm.  Patient was referred for further evaluation with pulmonary medicine to discuss tissue biopsy.  Patient subsequently scheduled for a ROBOTIC ASSISTED NAVIGATIONAL BRONCHOSCOPY (Bilateral) on 12/31/2023 with Dr. Raechel Chute, MD. Given patient's past medical history significant for cardiovascular diagnoses, presurgical cardiac clearance was sought by the PAT team. Per cardiology, "this patient is optimized for surgery and may proceed with the planned procedural course with a LOW risk of significant perioperative cardiovascular complications".  In review of the patient's chart, it is noted that he is on daily oral antithrombotic therapy.  Given his cardiovascular history, cardiology has requested that patient remain on his daily low-dose ASA throughout his perioperative course.  Patient has been updated on these instructions by the PAT team.    Patient denies previous perioperative  complications with anesthesia in the past. In review his EMR, it is noted that patient underwent a general anesthetic course here at [facility] ([facility]) in 07/2023 without documented complications.      12/25/2023    1:00 PM 12/25/2023   12:34 PM 12/23/2023    9:51 AM  Vitals with BMI  Height  6\' 0"    Weight 198 lbs 190 lbs 195 lbs 13 oz  BMI 26.85 25.76   Systolic   130  Diastolic   86  Pulse   77   Providers/Specialists:  NOTE: Primary physician provider listed below. Patient may have been seen by APP or partner within same practice.   PROVIDER ROLE / SPECIALTY LAST Maryruth Hancock, MD Pulmonary Medicine (Surgeon) 06/18/2023  Gracelyn Nurse, MD Primary Care Provider 06/24/2023  Rudean Hitt, MD Cardiology 11/04/2023  Gerrie Nordmann, MD Rheumatology 09/02/2023  Merri Ray, MD Physiatry 12/05/18   Allergies:   Allergies  Allergen Reactions   Fosamax [Alendronate Sodium]     Leg pain   Percocet [Oxycodone-Acetaminophen] Nausea And Vomiting   Current Home Medications:   No current facility-administered medications for this encounter.    ALPRAZolam (XANAX) 0.5 MG tablet   amLODipine (NORVASC) 2.5 MG tablet   aspirin EC 81 MG tablet   azaTHIOprine (IMURAN) 50 MG tablet   cetirizine (ZYRTEC) 10 MG tablet   cyanocobalamin (,VITAMIN B-12,) 1000 MCG/ML injection   ELDERBERRY PO   finasteride (PROSCAR) 5 MG tablet   hydrochlorothiazide (HYDRODIURIL) 12.5 MG tablet   hydroxychloroquine (PLAQUENIL) 200 MG tablet   ibuprofen (ADVIL,MOTRIN) 200 MG tablet   metoprolol succinate (TOPROL-XL) 50 MG 24 hr tablet   nicotine (NICODERM CQ - DOSED IN MG/24 HOURS) 21 mg/24hr patch   nitroGLYCERIN (NITROLINGUAL) 0.4 MG/SPRAY spray   omeprazole (PRILOSEC) 20 MG capsule   pravastatin (PRAVACHOL) 40 MG tablet   Probiotic Product (PROBIOTIC DAILY  PO)   spironolactone (ALDACTONE) 25 MG tablet   tadalafil (CIALIS) 20 MG tablet   tamsulosin  (FLOMAX) 0.4 MG CAPS capsule   triamcinolone (NASACORT) 55 MCG/ACT AERO nasal inhaler   History:   Past Medical History:  Diagnosis Date   AAA (abdominal aortic aneurysm) (HCC)    Abdominal aortic aneurysm without rupture (HCC)    Adenomatous colon polyp    Allergic rhinitis    Anemia    Anogenital (venereal) warts    Anxiety    a.) on BZO PRN (alprazolam)   Aortic atherosclerosis (HCC)    Arthritis    B12 deficiency    Back pain    Bilateral adrenal adenomas    BPH (benign prostatic hyperplasia)    Carpal tunnel syndrome    Celiac artery atherosclerosis    Coronary artery disease    a.) MV 02/26/2005: no ischemia; b.) MV 11/24/2010: mild inferosep ischemia; c.) LHC/PCI 12/08/2010: 99% pLAD --> rotational athrectomy + 3.0 x 23 mm Xience DES x 1; d.) MV 02/15/2011: no ischemia; e.) MV 02/14/2018: no ischemia   Degenerative joint disease    DOE (dyspnea on exertion)    Duodenitis    Elevated liver enzymes 05/2023   Erectile dysfunction    a.) on PDE5i (tadalafil)   GERD (gastroesophageal reflux disease)    Hepatic steatosis    History of shingles    History of tobacco abuse    HPV in male    Hx of adenomatous colonic polyps    Hyperlipidemia    Hypertension    Hyponatremia    Iliac artery aneurysm (HCC)    Long term (current) use of aspirin    Long term current use of immunosuppressive drug    a.) Tx'd with azathioprine + hydroxychloroquine for SLE   Lumbar stenosis    MGUS (monoclonal gammopathy of unknown significance)    Multiple pulmonary nodules determined by computed tomography of lung    Neuroendocrine carcinoma of stomach (HCC) 2023   Nocturia    Nodule of lower lobe of right lung 06/2023   Paresthesia of left lower extremity    Peripheral vascular disease (HCC)    Right lower lobe pulmonary nodule 06/20/2021   a.) Dotatate PET 06/20/2021: posterior RLL 0.6 x 0.5 mm: b.) LDCT chest 07/27/2022: 0.8 x 0.6 mm; c.) Dotatate PET 02/04/2023: 0.7 x 0.7 mm; d.) CT  chest 05/13/2023: 8 x 11 mm; e.) super D chest CT 07/17/2023: spiclated morphology 9 x 8 mm; f.) CT chest 12/09/2023: cavitary spiculated nodule 10 x 7 mm   Rotator cuff tendonitis    Shingles    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)    a.) Tx'd with azathioprine + hydroxychloroquine   Urinary frequency    Past Surgical History:  Procedure Laterality Date   BACK SURGERY     BIOPSY  08/28/2021   Procedure: BIOPSY;  Surgeon: Lemar Lofty., MD;  Location: [facility];  Service: Gastroenterology;;   BRONCHIAL BIOPSY  07/18/2023   Procedure: BRONCHIAL BIOPSIES;  Surgeon: Raechel Chute, MD;  Location: [facility];  Service: Pulmonary;;   BRONCHIAL NEEDLE ASPIRATION BIOPSY  07/18/2023   Procedure: BRONCHIAL NEEDLE ASPIRATION BIOPSIES;  Surgeon: Raechel Chute, MD;  Location: [facility];  Service: Pulmonary;;   BRONCHIAL WASHINGS  07/18/2023   Procedure: BRONCHIAL WASHINGS;  Surgeon: Raechel Chute, MD;  Location: [facility];  Service: Pulmonary;;   BROW LIFT Bilateral 01/27/2021   Procedure: BLEPHAROPLASTY UPPER EYELID; W/EXCESS SKIN BROW PTOSIS REPAIR AND BLEPHAROPTOSIS REPAIR; RESECT  EX BILATERAL;  Surgeon: Imagene Riches, MD;  Location: [facility];  Service: Ophthalmology;  Laterality: Bilateral;   COLONOSCOPY     COLONOSCOPY WITH PROPOFOL N/A 09/30/2019   Procedure: COLONOSCOPY WITH PROPOFOL;  Surgeon: Earline Mayotte, MD;  Location: [facility];  Service: Endoscopy;  Laterality: N/A;   COLONOSCOPY WITH PROPOFOL N/A 05/05/2021   Procedure: COLONOSCOPY WITH PROPOFOL;  Surgeon: Regis Bill, MD;  Location: [facility];  Service: Endoscopy;  Laterality: N/A;   CORONARY ANGIOPLASTY WITH STENT PLACEMENT Left 12/08/2010   Procedure: CORONARY ANGIOPLASTY WITH STENT PLACEMENT (pLAD); Procedure: [facility]   ESOPHAGOGASTRODUODENOSCOPY     ESOPHAGOGASTRODUODENOSCOPY (EGD) WITH PROPOFOL N/A 05/05/2021   Procedure: ESOPHAGOGASTRODUODENOSCOPY (EGD)  WITH PROPOFOL;  Surgeon: Regis Bill, MD;  Location: [facility];  Service: Endoscopy;  Laterality: N/A;   ESOPHAGOGASTRODUODENOSCOPY (EGD) WITH PROPOFOL N/A 08/28/2021   Procedure: ESOPHAGOGASTRODUODENOSCOPY (EGD) WITH PROPOFOL;  Surgeon: Meridee Score Netty Starring., MD;  Location: [facility];  Service: Gastroenterology;  Laterality: N/A;   ESOPHAGOGASTRODUODENOSCOPY (EGD) WITH PROPOFOL N/A 08/17/2022   Procedure: ESOPHAGOGASTRODUODENOSCOPY (EGD) WITH PROPOFOL;  Surgeon: Regis Bill, MD;  Location: [facility];  Service: Endoscopy;  Laterality: N/A;   EUS N/A 08/28/2021   Procedure: UPPER ENDOSCOPIC ULTRASOUND (EUS) RADIAL;  Surgeon: Lemar Lofty., MD;  Location: [facility];  Service: Gastroenterology;  Laterality: N/A;   LUMBAR LAMINECTOMY/DECOMPRESSION MICRODISCECTOMY Left 02/03/2015   Procedure: LUMBAR LAMINECTOMY/DECOMPRESSION MICRODISCECTOMY LEFT LUMBAR FOUR-FIVE WITH REMOVAL OF SYNOVIAL CYST;  Surgeon: Tressie Stalker, MD;  Location: [facility];  Service: Neurosurgery;  Laterality: Left;   NASAL SEPTOPLASTY W/ TURBINOPLASTY Bilateral 10/23/2019   Procedure: NASAL SEPTOPLASTY WITH SUBMUCOUS RESECTION OF TURBINATE;  Surgeon: Linus Salmons, MD;  Location: [facility];  Service: ENT;  Laterality: Bilateral;   PELVIC ANGIOGRAPHY N/A 05/02/2018   Procedure: PELVIC ANGIOGRAPHY;  Surgeon: Renford Dills, MD;  Location: [facility];  Service: Cardiovascular;  Laterality: N/A;   POLYPECTOMY  08/28/2021   Procedure: POLYPECTOMY;  Surgeon: Meridee Score Netty Starring., MD;  Location: [facility];  Service: Gastroenterology;;   SHOULDER ARTHROSCOPY WITH SUBACROMIAL DECOMPRESSION AND BICEP TENDON REPAIR Right 12/04/2018   Procedure: SHOULDER ARTHROSCOPY WITH DEBRIDEMENT, DECOMPRESSION, BICEP TENODESIS;  Surgeon: Christena Flake, MD;  Location: [facility];  Service: Orthopedics;  Laterality: Right;   SUBMUCOSAL INJECTION  08/28/2021   Procedure: SUBMUCOSAL  INJECTION;  Surgeon: Meridee Score Netty Starring., MD;  Location: [facility];  Service: Gastroenterology;;   Family History  Problem Relation Age of Onset   Leukemia Mother        dx 23s   Hyperlipidemia Father    Hypertension Father    Diabetes Father    Colon cancer Father        dx 68s   Hearing loss Father    Vascular Disease Father    Lung cancer Brother        d. 55   Social History   Tobacco Use   Smoking status: Some Days    Current packs/day: 0.00    Average packs/day: 1.5 packs/day for 30.0 years (45.0 ttl pk-yrs)    Types: Cigarettes    Start date: 04/01/1991    Last attempt to quit: 03/31/2021    Years since quitting: 2.7   Smokeless tobacco: Never  Substance Use Topics   Alcohol use: Yes    Alcohol/week: 14.0 standard drinks of alcohol    Types: 14 Standard drinks or equivalent per week    Comment: cocktails about 2 daily   Pertinent Clinical Results:  LABS:    Component Ref Range & Units 12/23/2023  WBC (White Blood Cell Count) 4.1 - 10.2 10^3/uL 6.5  RBC (Red Blood Cell Count) 4.69 - 6.13 10^6/uL 4.55 Low   Hemoglobin 14.1 - 18.1 gm/dL 96.0  Hematocrit 45.4 - 52.0 % 44.6  MCV (Mean Corpuscular Volume) 80.0 - 100.0 fl 98  MCH (Mean Corpuscular Hemoglobin) 27.0 - 31.2 pg 33.8 High   MCHC (Mean Corpuscular Hemoglobin Concentration) 32.0 - 36.0 gm/dL 09.8  Platelet Count 119 - 450 10^3/uL 264  RDW-CV (Red Cell Distribution Width) 11.6 - 14.8 % 13  MPV (Mean Platelet Volume) 9.4 - 12.4 fl 10.1  Neutrophils 1.50 - 7.80 10^3/uL 4.64  Lymphocytes 1.00 - 3.60 10^3/uL 1.06  Monocytes 0.00 - 1.50 10^3/uL 0.57  Eosinophils 0.00 - 0.55 10^3/uL 0.12  Basophils 0.00 - 0.09 10^3/uL 0.04  Neutrophil % 32.0 - 70.0 % 71.8 High   Lymphocyte % 10.0 - 50.0 % 16.4  Monocyte % 4.0 - 13.0 % 8.8  Eosinophil % 1.0 - 5.0 % 1.9  Basophil% 0.0 - 2.0 % 0.6  Immature Granulocyte % <=0.7 % 0.5  Immature Granulocyte Count <=0.06 10^3/L 0.03  Resulting Agency  [facility]  Specimen Collected: 12/23/23 08:14   Performed by: [facility] Last Resulted: 12/23/23 08:52  Received From: [facility]  Result Received: 12/24/23 13:55   Component Ref Range & Units 12/23/2023  Glucose 70 - 110 mg/dL 147 High   Sodium 829 - 145 mmol/L 139  Potassium 3.6 - 5.1 mmol/L 4.2  Chloride 97 - 109 mmol/L 102  Carbon Dioxide (CO2) 22.0 - 32.0 mmol/L 29  Urea Nitrogen (BUN) 7 - 25 mg/dL 13  Creatinine 0.7 - 1.3 mg/dL 0.8  Glomerular Filtration Rate (eGFR) >60 mL/min/1.73sq m 95  Calcium 8.7 - 10.3 mg/dL 9.1  AST 8 - 39 U/L 34  ALT 6 - 57 U/L 50  Alk Phos (alkaline Phosphatase) 34 - 104 U/L 78  Albumin 3.5 - 4.8 g/dL 4.1  Bilirubin, Total 0.3 - 1.2 mg/dL 0.6  Protein, Total 6.1 - 7.9 g/dL 6.4  A/G Ratio 1.0 - 5.0 gm/dL 1.8  Resulting Agency [facility]  Specimen Collected: 12/23/23 08:14   Performed by: [facility] Last Resulted: 12/23/23 14:24  Received From: [facility]  Result Received: 12/24/23 13:55    ECG: Date: 12/26/2023  Time ECG obtained: 0934 AM Rate: 80 bpm Rhythm:  Sinus rhythm with sinus arrhythmia Axis (leads I and aVF): normal Intervals: PR 170 ms. QRS 74 ms. QTc 447 ms. ST segment and T wave changes: No evidence of acute T wave abnormalities or significant ST segment elevation or depression.  Evidence of a possible, age undetermined, prior infarct:  No Comparison: Similar to previous tracing obtained on 04/22/2023   IMAGING / PROCEDURES: CT CHEST WO CONTRAST performed on 06/07/2024 Stable cavitary spiculated nodule in the right lower lobe measuring 10 x 7 mm. Additional pulmonary nodules are also stable dating back to 07/27/2022. Hepatic steatosis. Aortic atherosclerosis  MR LUMBAR SPINE WO CONTRAST performed on 01/26/2023 Multilevel degenerative changes of the lumbar spine, worst at L4-L5. Unchanged moderate right  lateral recess stenosis encroaching the descending right L5 nerve root and mild bilateral neural foraminal stenosis.  Resolved marrow edema previously seen at the facet joints. Unchanged mild spinal canal stenosis at L2-L3 and L3-L4.  MYOCARDIAL PERFUSION IMAGING STUDY (LEXISCAN) performed on 02/14/2018 Normal left ventricular systolic function  with a normal LVEF of 54% Normal myocardial thickening and wall motion Left ventricular cavity size normal SPECT images demonstrate homogenous tracer distribution throughout the myocardium No evidence of stress-induced myocardial ischemia or arrhythmia Normal low risk study  Impression and Plan:  Hortencia Conradi has been referred for pre-anesthesia review and clearance prior to him undergoing the planned anesthetic and procedural courses. Available labs, pertinent testing, and imaging results were personally reviewed by me in preparation for upcoming operative/procedural course. [facility] medical record has been updated following extensive record review and patient interview with PAT staff.   This patient has been appropriately cleared by cardiology with an overall LOW Risk of experiencing significant perioperative cardiovascular complications. Based on clinical review performed today (12/30/23), barring any significant acute changes in the patient's overall condition, it is anticipated that he will be able to proceed with the planned surgical intervention. Any acute changes in clinical condition may necessitate his procedure being postponed and/or cancelled. Patient will meet with anesthesia team (MD and/or CRNA) on the day of his procedure for preoperative evaluation/assessment. Questions regarding anesthetic course will be fielded at that time.   Pre-surgical instructions were reviewed with the patient during his PAT appointment, and questions were fielded to satisfaction by PAT clinical staff. He has been instructed on which medications that he will need  to hold prior to surgery, as well as the ones that have been deemed safe/appropriate to take on the day of his procedure. As part of the general education provided by PAT, patient made aware both verbally and in writing, that he would need to abstain from the use of any illegal substances during his perioperative course. He was advised that failure to follow the provided instructions could necessitate case cancellation or result in serious perioperative complications up to and including death. Patient encouraged to contact PAT and/or his surgeon's office to discuss any questions or concerns that may arise prior to surgery; verbalized understanding.   Quentin Mulling, MSN, APRN, FNP-C, CEN [facility]  Perioperative Services Nurse Practitioner Phone: [phone] Fax: [phone] 12/30/23 2:22 PM  NOTE: This note has been prepared using Dragon dictation software. Despite my best ability to proofread, there is always the potential that unintentional transcriptional errors may still occur from this process.

## 2023-12-30 NOTE — Telephone Encounter (Signed)
 I spoke with the patient. He is aware of the time change for his procedure tomorrow.   Nothing further needed.

## 2023-12-31 ENCOUNTER — Ambulatory Visit: Payer: Medicare Other

## 2023-12-31 ENCOUNTER — Ambulatory Visit
Admission: RE | Admit: 2023-12-31 | Discharge: 2023-12-31 | Disposition: A | Discharge status: Home / Self Care | Payer: Medicare Other | Attending: Student in an Organized Health Care Education/Training Program | Admitting: Student in an Organized Health Care Education/Training Program

## 2023-12-31 ENCOUNTER — Ambulatory Visit: Payer: Medicare Other | Admitting: Urgent Care

## 2023-12-31 ENCOUNTER — Encounter: Payer: Self-pay | Admitting: Student in an Organized Health Care Education/Training Program

## 2023-12-31 ENCOUNTER — Other Ambulatory Visit: Payer: Self-pay

## 2023-12-31 ENCOUNTER — Encounter
Admission: RE | Disposition: A | Discharge status: Home / Self Care | Payer: Self-pay | Source: Home / Self Care | Attending: Student in an Organized Health Care Education/Training Program

## 2023-12-31 DIAGNOSIS — I2510 Atherosclerotic heart disease of native coronary artery without angina pectoris: Secondary | ICD-10-CM | POA: Insufficient documentation

## 2023-12-31 DIAGNOSIS — N400 Enlarged prostate without lower urinary tract symptoms: Secondary | ICD-10-CM | POA: Diagnosis not present

## 2023-12-31 DIAGNOSIS — K219 Gastro-esophageal reflux disease without esophagitis: Secondary | ICD-10-CM | POA: Insufficient documentation

## 2023-12-31 DIAGNOSIS — Z79899 Other long term (current) drug therapy: Secondary | ICD-10-CM | POA: Diagnosis not present

## 2023-12-31 DIAGNOSIS — Z955 Presence of coronary angioplasty implant and graft: Secondary | ICD-10-CM | POA: Diagnosis not present

## 2023-12-31 DIAGNOSIS — Z85028 Personal history of other malignant neoplasm of stomach: Secondary | ICD-10-CM | POA: Insufficient documentation

## 2023-12-31 DIAGNOSIS — E785 Hyperlipidemia, unspecified: Secondary | ICD-10-CM | POA: Diagnosis not present

## 2023-12-31 DIAGNOSIS — I10 Essential (primary) hypertension: Secondary | ICD-10-CM | POA: Diagnosis not present

## 2023-12-31 DIAGNOSIS — I714 Abdominal aortic aneurysm, without rupture, unspecified: Secondary | ICD-10-CM | POA: Insufficient documentation

## 2023-12-31 DIAGNOSIS — M329 Systemic lupus erythematosus, unspecified: Secondary | ICD-10-CM | POA: Insufficient documentation

## 2023-12-31 DIAGNOSIS — F419 Anxiety disorder, unspecified: Secondary | ICD-10-CM | POA: Diagnosis not present

## 2023-12-31 DIAGNOSIS — I739 Peripheral vascular disease, unspecified: Secondary | ICD-10-CM | POA: Diagnosis not present

## 2023-12-31 DIAGNOSIS — R911 Solitary pulmonary nodule: Secondary | ICD-10-CM | POA: Diagnosis not present

## 2023-12-31 DIAGNOSIS — F17210 Nicotine dependence, cigarettes, uncomplicated: Secondary | ICD-10-CM | POA: Diagnosis not present

## 2023-12-31 DIAGNOSIS — Z7982 Long term (current) use of aspirin: Secondary | ICD-10-CM | POA: Insufficient documentation

## 2023-12-31 HISTORY — DX: Benign neoplasm of right adrenal gland: D35.01

## 2023-12-31 HISTORY — DX: Personal history of nicotine dependence: Z87.891

## 2023-12-31 HISTORY — DX: Allergic rhinitis, unspecified: J30.9

## 2023-12-31 HISTORY — DX: Long term (current) use of unspecified immunomodulators and immunosuppressants: Z79.60

## 2023-12-31 HISTORY — DX: Spinal stenosis, lumbar region without neurogenic claudication: M48.061

## 2023-12-31 HISTORY — PX: BRONCHIAL BIOPSY: SHX5109

## 2023-12-31 HISTORY — DX: Other forms of dyspnea: R06.09

## 2023-12-31 HISTORY — DX: Zoster without complications: B02.9

## 2023-12-31 HISTORY — DX: Deficiency of other specified B group vitamins: E53.8

## 2023-12-31 HISTORY — DX: Other long term (current) drug therapy: Z79.899

## 2023-12-31 HISTORY — DX: Atherosclerosis of aorta: I70.0

## 2023-12-31 HISTORY — DX: Benign prostatic hyperplasia without lower urinary tract symptoms: N40.0

## 2023-12-31 HISTORY — DX: Abdominal aortic aneurysm, without rupture, unspecified: I71.40

## 2023-12-31 HISTORY — DX: Systemic lupus erythematosus, unspecified: M32.9

## 2023-12-31 HISTORY — DX: Benign neoplasm of left adrenal gland: D35.02

## 2023-12-31 HISTORY — DX: Monoclonal gammopathy: D47.2

## 2023-12-31 HISTORY — DX: Duodenitis without bleeding: K29.80

## 2023-12-31 HISTORY — DX: Fatty (change of) liver, not elsewhere classified: K76.0

## 2023-12-31 HISTORY — DX: Paresthesia of skin: R20.2

## 2023-12-31 HISTORY — DX: Long term (current) use of aspirin: Z79.82

## 2023-12-31 HISTORY — DX: Benign neoplasm of colon, unspecified: D12.6

## 2023-12-31 HISTORY — DX: Other nonspecific abnormal finding of lung field: R91.8

## 2023-12-31 SURGERY — BRONCHOSCOPY, WITH BIOPSY USING ELECTROMAGNETIC NAVIGATION
Anesthesia: General | Laterality: Bilateral

## 2023-12-31 MED ORDER — FENTANYL CITRATE (PF) 100 MCG/2ML IJ SOLN
INTRAMUSCULAR | Status: DC | PRN
  Administered 2023-12-31 (×2): 50 ug via INTRAVENOUS

## 2023-12-31 MED ORDER — CHLORHEXIDINE GLUCONATE 0.12 % MT SOLN
OROMUCOSAL | Status: AC
  Filled 2023-12-31: qty 15

## 2023-12-31 MED ORDER — DEXAMETHASONE SODIUM PHOSPHATE 10 MG/ML IJ SOLN
INTRAMUSCULAR | Status: AC
  Filled 2023-12-31: qty 1

## 2023-12-31 MED ORDER — FENTANYL CITRATE (PF) 100 MCG/2ML IJ SOLN
INTRAMUSCULAR | Status: AC
  Filled 2023-12-31: qty 2

## 2023-12-31 MED ORDER — PROPOFOL 1000 MG/100ML IV EMUL
INTRAVENOUS | Status: AC
  Filled 2023-12-31: qty 100

## 2023-12-31 MED ORDER — SUGAMMADEX SODIUM 200 MG/2ML IV SOLN
INTRAVENOUS | Status: DC | PRN
  Administered 2023-12-31: 200 mg via INTRAVENOUS

## 2023-12-31 MED ORDER — DEXAMETHASONE SODIUM PHOSPHATE 10 MG/ML IJ SOLN
INTRAMUSCULAR | Status: DC | PRN
  Administered 2023-12-31: 10 mg via INTRAVENOUS

## 2023-12-31 MED ORDER — LIDOCAINE HCL (PF) 2 % IJ SOLN
INTRAMUSCULAR | Status: AC
  Filled 2023-12-31: qty 5

## 2023-12-31 MED ORDER — LIDOCAINE HCL (CARDIAC) PF 100 MG/5ML IV SOSY
PREFILLED_SYRINGE | INTRAVENOUS | Status: DC | PRN
  Administered 2023-12-31: 100 mg via INTRAVENOUS

## 2023-12-31 MED ORDER — PROPOFOL 10 MG/ML IV BOLUS
INTRAVENOUS | Status: AC
  Filled 2023-12-31: qty 20

## 2023-12-31 MED ORDER — LACTATED RINGERS IV SOLN: INTRAVENOUS | Status: DC

## 2023-12-31 MED ORDER — MIDAZOLAM HCL 2 MG/2ML IJ SOLN
INTRAMUSCULAR | Status: DC | PRN
  Administered 2023-12-31: 2 mg via INTRAVENOUS

## 2023-12-31 MED ORDER — PHENYLEPHRINE 80 MCG/ML (10ML) SYRINGE FOR IV PUSH (FOR BLOOD PRESSURE SUPPORT)
PREFILLED_SYRINGE | INTRAVENOUS | Status: DC | PRN
  Administered 2023-12-31: 160 ug via INTRAVENOUS
  Administered 2023-12-31 (×2): 80 ug via INTRAVENOUS

## 2023-12-31 MED ORDER — CHLORHEXIDINE GLUCONATE 0.12 % MT SOLN
15.0000 mL | Freq: Once | OROMUCOSAL | Status: AC
  Administered 2023-12-31: 15 mL via OROMUCOSAL

## 2023-12-31 MED ORDER — ROCURONIUM BROMIDE 100 MG/10ML IV SOLN
INTRAVENOUS | Status: DC | PRN
  Administered 2023-12-31: 50 mg via INTRAVENOUS

## 2023-12-31 MED ORDER — ORAL CARE MOUTH RINSE: 15.0000 mL | Freq: Once | OROMUCOSAL | Status: AC

## 2023-12-31 MED ORDER — MIDAZOLAM HCL 2 MG/2ML IJ SOLN
INTRAMUSCULAR | Status: AC
  Filled 2023-12-31: qty 2

## 2023-12-31 MED ORDER — PHENYLEPHRINE HCL-NACL 20-0.9 MG/250ML-% IV SOLN
INTRAVENOUS | Status: DC | PRN
  Administered 2023-12-31: 40 ug/min via INTRAVENOUS

## 2023-12-31 MED ORDER — ONDANSETRON HCL 4 MG/2ML IJ SOLN
INTRAMUSCULAR | Status: DC | PRN
  Administered 2023-12-31: 4 mg via INTRAVENOUS

## 2023-12-31 MED ORDER — PHENYLEPHRINE HCL-NACL 20-0.9 MG/250ML-% IV SOLN
INTRAVENOUS | Status: AC
  Filled 2023-12-31: qty 250

## 2023-12-31 MED ORDER — ROCURONIUM BROMIDE 10 MG/ML (PF) SYRINGE
PREFILLED_SYRINGE | INTRAVENOUS | Status: AC
  Filled 2023-12-31: qty 10

## 2023-12-31 MED ORDER — ONDANSETRON HCL 4 MG/2ML IJ SOLN
INTRAMUSCULAR | Status: AC
  Filled 2023-12-31: qty 2

## 2023-12-31 MED ORDER — PROPOFOL 10 MG/ML IV BOLUS
INTRAVENOUS | Status: DC | PRN
  Administered 2023-12-31: 150 mg via INTRAVENOUS
  Administered 2023-12-31: 130 ug/kg/min via INTRAVENOUS

## 2023-12-31 NOTE — Transfer of Care (Signed)
 Immediate Anesthesia Transfer of Care Note  Patient: Sergio Callahan  Procedure(s) Performed: ROBOTIC ASSISTED NAVIGATIONAL BRONCHOSCOPY (Bilateral)  Patient Location: PACU  Anesthesia Type:General  Level of Consciousness: awake, alert , and oriented  Airway & Oxygen Therapy: Patient Spontanous Breathing and Patient connected to face mask oxygen  Post-op Assessment: Report given to RN and Post -op Vital signs reviewed and stable  Post vital signs: Reviewed and stable  Last Vitals:  Vitals Value Taken Time  BP 102/64 12/31/23 1437  Temp 35.9 1435  Pulse 78 12/31/23 1439  Resp 17 12/31/23 1439  SpO2 95 % 12/31/23 1439  Vitals shown include unfiled device data.  Last Pain:  Vitals:   12/31/23 1246  TempSrc: Temporal  PainSc: 0-No pain         Complications: No notable events documented.

## 2023-12-31 NOTE — Anesthesia Procedure Notes (Signed)
 Procedure Name: Intubation Date/Time: 12/31/2023 1:31 PM  Performed by: Morene Crocker, CRNAPre-anesthesia Checklist: Patient identified, Patient being monitored, Timeout performed, Emergency Drugs available and Suction available Patient Re-evaluated:Patient Re-evaluated prior to induction Oxygen Delivery Method: Circle system utilized Preoxygenation: Pre-oxygenation with 100% oxygen Induction Type: IV induction Ventilation: Mask ventilation without difficulty and Oral airway inserted - appropriate to patient size Laryngoscope Size: 3 and McGrath Grade View: Grade I Tube type: Oral Tube size: 8.5 mm Number of attempts: 1 Airway Equipment and Method: Stylet Placement Confirmation: ETT inserted through vocal cords under direct vision, positive ETCO2 and breath sounds checked- equal and bilateral Secured at: 22 cm Tube secured with: Tape Dental Injury: Teeth and Oropharynx as per pre-operative assessment  Comments: Smooth atraumatic intubation, no complications noted.

## 2023-12-31 NOTE — Op Note (Signed)
 Video Bronchoscopy with Robotic Assisted Bronchoscopic Navigation   Date of Operation: 12/31/2023   Pre-op Diagnosis: lung nodule  Surgeon: Aundria Rud  Anesthesia: General endotracheal anesthesia  Operation: Flexible video fiberoptic bronchoscopy with robotic assistance and biopsies.  Estimated Blood Loss: Minimal  Complications: None  Indications and History: Sergio Callahan is a 73 y.o. male with history of gastric neuroendocrine ca presenting for biopsy of a RLL nodule. The risks, benefits, complications, treatment options and expected outcomes were discussed with the patient.  The possibilities of pneumothorax, pneumonia, reaction to medication, pulmonary aspiration, perforation of a viscus, bleeding, failure to diagnose a condition and creating a complication requiring transfusion or operation were discussed with the patient who freely signed the consent.    Description of Procedure: The patient was seen in the Preoperative Area, was examined and was deemed appropriate to proceed.  The patient was taken to [facility] bronchoscopy room, identified as Sergio Callahan and the procedure verified as Flexible Video Fiberoptic Bronchoscopy.  A Time Out was held and the above information confirmed.   Prior to the date of the procedure a high-resolution CT scan of the chest was performed. Utilizing ION software program a virtual tracheobronchial tree was generated to allow the creation of distinct navigation pathways to the patient's parenchymal abnormalities. After being taken to the operating room general anesthesia was initiated and the patient  was orally intubated. The video fiberoptic bronchoscope was introduced via the endotracheal tube and a general inspection was performed which showed normal right and left lung anatomy, aspiration of the bilateral mainstems was completed to remove any remaining secretions. Robotic catheter inserted into patient's endotracheal tube.   Target #1 RLL nodule: The  distinct navigation pathways prepared prior to this procedure were then utilized to navigate to patient's lesion identified on CT scan. The robotic catheter was secured into place and the vision probe was withdrawn.  Lesion location was approximated using fluoroscopy and cone beam CT (GE 3D). Under fluoroscopic guidance transbronchial brushings, transbronchial needle biopsies, and transbronchial forceps biopsies were performed to be sent for cytology and pathology.   At the end of the procedure a general airway inspection was performed and there was no evidence of active bleeding. The bronchoscope was removed.  The patient tolerated the procedure well. There was no significant blood loss and there were no obvious complications. A post-procedural chest x-ray is pending.  Samples Target #1: 1. Transbronchial brushings from RLL nodule 2. Transbronchial needle biopsies from RLL nodule 3. Transbronchial forceps biopsies from RLL nodule    Plans:  The patient will be discharged from the PACU to home when recovered from anesthesia and after chest x-ray is reviewed. We will review the cytology, pathology and microbiology results with the patient when they become available. Outpatient followup will be with myself and Dr. Cathie Hoops.  Raechel Chute, MD [facility] 12/31/2023 2:31 PM

## 2023-12-31 NOTE — Anesthesia Preprocedure Evaluation (Addendum)
 Anesthesia Evaluation  Patient identified by MRN, date of birth, ID band Patient awake    Reviewed: Allergy & Precautions, H&P , NPO status , Patient's Chart, lab work & pertinent test results  Airway Mallampati: II  TM Distance: >3 FB Neck ROM: full    Dental no notable dental hx.    Pulmonary Current Smoker and Patient abstained from smoking., former smoker   Pulmonary exam normal        Cardiovascular Exercise Tolerance: Poor hypertension, + CAD, + Cardiac Stents and + Peripheral Vascular Disease  Normal cardiovascular exam     Neuro/Psych negative neurological ROS  negative psych ROS   GI/Hepatic negative GI ROS, Neg liver ROS,,,  Endo/Other  negative endocrine ROS  History of lupus  Renal/GU      Musculoskeletal   Abdominal Normal abdominal exam  (+)   Peds  Hematology negative hematology ROS (+)   Anesthesia Other Findings Super D chest CT imaging performed on 07/17/2023 demonstrated a spiculated morphology of a concerning nodule in the RIGHT lower pulmonary lobe.  Repeat CT imaging of the chest performed on 12/09/2023 revealed that the area of concern now was cavitary and spiculated measuring 10 x 7 mm.  Patient was referred for further evaluation with pulmonary medicine to discuss tissue biopsy.  Patient subsequently scheduled for a ROBOTIC ASSISTED NAVIGATIONAL BRONCHOSCOPY (Bilateral) on 12/31/2023 with Dr. Raechel Chute, MD. Given patient's past medical history significant for cardiovascular diagnoses, presurgical cardiac clearance was sought by the PAT team. Per cardiology, "this patient is optimized for surgery and may proceed with the planned procedural course with a LOW risk of significant perioperative cardiovascular complications".  Follows with cardiology at [facility] clinic for history of CAD s/p PCI to the LAD in 2012, peripheral vascular disease s/p stenting to bilateral common iliac arteries, and  hypertension.  Nuclear stress test 2019 was low risk.   Former smoker, 45 pack years, quit 2022.  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 No date: Coronary artery disease     Comment:  a.) MV 02/26/2005: no ischemia; b.) MV 11/24/2010: mild               inferosep ischemia; c.) LHC/PCI 12/08/2010: 99% pLAD -->               rotational athrectomy + 3.0 x 23 mm Xience DES x 1; d.)               MV 02/15/2011: no ischemia; e.) MV 02/14/2018: no               ischemia No date: Degenerative joint disease No date: DOE (dyspnea on exertion) No date: Duodenitis 05/2023: Elevated liver enzymes No date: Erectile dysfunction     Comment:  a.) on PDE5i (tadalafil) No date: GERD (gastroesophageal reflux disease) No date: Hepatic steatosis No date: History of shingles No date: History of tobacco abuse No date: 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 No date: Hx of adenomatous colonic polyps No date: Hyperlipidemia No date: Hypertension No date: Hyponatremia No date: Iliac artery  aneurysm (HCC) No date: Long term (current) use of aspirin No date: Long term current use of immunosuppressive drug     Comment:  a.) Tx'd with azathioprine + hydroxychloroquine for SLE No date: Lumbar stenosis No date: MGUS (monoclonal gammopathy of unknown significance) No date: Multiple pulmonary nodules determined by computed tomography  of lung 2023: Neuroendocrine carcinoma of stomach (HCC) No date: Nocturia 06/2023: Nodule of  lower lobe of right lung No date: Paresthesia of left lower extremity No date: Peripheral vascular disease (HCC) 06/20/2021: Right lower lobe pulmonary nodule     Comment:  a.) Dotatate PET 06/20/2021: posterior RLL 0.6 x 0.5 mm:              b.) LDCT chest 07/27/2022: 0.8 x 0.6 mm; c.) Dotatate PET              02/04/2023: 0.7 x 0.7 mm; d.) CT chest 05/13/2023: 8 x 11              mm; e.) super D chest CT 07/17/2023: spiclated morphology              9 x 8 mm; f.) CT chest 12/09/2023: cavitary spiculated               nodule 10 x 7 mm No date: Rotator cuff tendonitis No date: Shingles No date: 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)     Comment:  a.) Tx'd with azathioprine + hydroxychloroquine No date: Urinary frequency  Past Surgical History: No date: BACK SURGERY 08/28/2021: BIOPSY     Comment:  Procedure: BIOPSY;  Surgeon: Lemar Lofty.,               MD;  Location: [facility];  Service: Gastroenterology;; 07/18/2023: BRONCHIAL BIOPSY     Comment:  Procedure: BRONCHIAL BIOPSIES;  Surgeon: Raechel Chute,              MD;  Location: [facility];  Service: Pulmonary;; 07/18/2023: BRONCHIAL NEEDLE ASPIRATION BIOPSY     Comment:  Procedure: BRONCHIAL NEEDLE ASPIRATION BIOPSIES;                Surgeon: Raechel Chute, MD;  Location: [facility];                Service: Pulmonary;; 07/18/2023: BRONCHIAL WASHINGS     Comment:  Procedure: BRONCHIAL WASHINGS;  Surgeon: Raechel Chute,              MD;  Location: [facility];  Service: Pulmonary;; 01/27/2021: BROW LIFT; Bilateral     Comment:  Procedure: BLEPHAROPLASTY UPPER EYELID; W/EXCESS SKIN               BROW PTOSIS REPAIR AND BLEPHAROPTOSIS REPAIR; RESECT EX               BILATERAL;  Surgeon: Imagene Riches, MD;  Location: [facility];  Service: Ophthalmology;  Laterality:               Bilateral; No date: COLONOSCOPY 09/30/2019: COLONOSCOPY WITH PROPOFOL; N/A     Comment:   Procedure: COLONOSCOPY WITH PROPOFOL;  Surgeon: Earline Mayotte, MD;  Location: [facility] END[facility]COPY;  Service:               Endoscopy;  Laterality: N/A; 05/05/2021: COLONOSCOPY WITH PROPOFOL; N/A     Comment:  Procedure: COLONOSCOPY WITH PROPOFOL;  Surgeon:               Regis Bill, MD;  Location: [facility] END[facility]COPY;                Service: Endoscopy;  Laterality: N/A; 12/08/2010: CORONARY ANGIOPLASTY WITH STENT PLACEMENT; Left     Comment:  Procedure: CORONARY ANGIOPLASTY WITH STENT PLACEMENT               (pLAD); Procedure: [facility] No date: ESOPHAGOGASTRODUODENOSCOPY 05/05/2021: ESOPHAGOGASTRODUODENOSCOPY (EGD) WITH PROPOFOL; N/A     Comment:  Procedure: ESOPHAGOGASTRODUODENOSCOPY (EGD) WITH               PROPOFOL;  Surgeon: Regis Bill, MD;  Location:               [facility];  Service: Endoscopy;  Laterality: N/A; 08/28/2021: ESOPHAGOGASTRODUODENOSCOPY (EGD) WITH PROPOFOL; N/A     Comment:  Procedure: ESOPHAGOGASTRODUODENOSCOPY (EGD) WITH               PROPOFOL;  Surgeon: Meridee Score Netty Starring., MD;                Location: [facility];  Service: Gastroenterology;                Laterality: N/A; 08/17/2022: ESOPHAGOGASTRODUODENOSCOPY (EGD) WITH PROPOFOL; N/A     Comment:  Procedure: ESOPHAGOGASTRODUODENOSCOPY (EGD) WITH               PROPOFOL;  Surgeon: Regis Bill, MD;  Location:               [facility];  Service: Endoscopy;  Laterality: N/A; 08/28/2021: EUS; N/A     Comment:  Procedure: UPPER ENDOSCOPIC ULTRASOUND (EUS) RADIAL;                Surgeon: Meridee Score Netty Starring., MD;  Location: [facility]               ENDOSCOPY;  Service: Gastroenterology;  Laterality: N/A; 02/03/2015: LUMBAR LAMINECTOMY/DECOMPRESSION MICRODISCECTOMY; Left     Comment:  Procedure: LUMBAR LAMINECTOMY/DECOMPRESSION               MICRODISCECTOMY LEFT LUMBAR FOUR-FIVE WITH REMOVAL OF               SYNOVIAL CYST;  Surgeon: Tressie Stalker, MD;  Location:               [facility];  Service: Neurosurgery;  Laterality: Left; 10/23/2019: NASAL SEPTOPLASTY W/ TURBINOPLASTY; Bilateral     Comment:  Procedure: NASAL SEPTOPLASTY WITH SUBMUCOUS RESECTION OF              TURBINATE;  Surgeon: Linus Salmons, MD;  Location[facility]               [facility];  Service: ENT;  Laterality:               Bilateral; 05/02/2018: PELVIC ANGIOGRAPHY; N/A     Comment:  Procedure: PELVIC ANGIOGRAPHY;  Surgeon: Renford Dills, MD;  Location: [facility];  Service:              Cardiovascular;  Laterality: N/A; 08/28/2021: POLYPECTOMY     Comment:  Procedure: POLYPECTOMY;  Surgeon: Lemar Lofty., MD;  Location: [facility];  Service:  Gastroenterology;; 12/04/2018: SHOULDER ARTHROSCOPY WITH SUBACROMIAL DECOMPRESSION AND  BICEP TENDON REPAIR; Right     Comment:  Procedure: SHOULDER ARTHROSCOPY WITH DEBRIDEMENT,               DECOMPRESSION, BICEP TENODESIS;  Surgeon: Christena Flake,               MD;  Location: [facility] OR[facility];  Service: Orthopedics;                Laterality: Right; 08/28/2021: SUBMUCOSAL INJECTION     Comment:  Procedure: SUBMUCOSAL INJECTION;  Surgeon: Lemar Lofty., MD;  Location: [facility];  Service:               Gastroenterology;;     Reproductive/Obstetrics negative OB ROS                             Anesthesia Physical Anesthesia Plan  ASA: 3  Anesthesia Plan: General ETT   Post-op Pain Management: Minimal or no pain anticipated   Induction: Intravenous  PONV Risk Score and Plan: 2 and Ondansetron, Dexamethasone and Midazolam  Airway Management Planned: Oral ETT  Additional Equipment:   Intra-op Plan:   Post-operative Plan: Extubation in OR  Informed Consent: I have reviewed the patients History and Physical, chart, labs and discussed the procedure including the risks, benefits and alternatives for the proposed anesthesia  with the patient or authorized representative who has indicated his/her understanding and acceptance.     Dental Advisory Given  Plan Discussed with: CRNA and Surgeon  Anesthesia Plan Comments:         Anesthesia Quick Evaluation

## 2023-12-31 NOTE — H&P (Signed)
 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 Given his history of gastric neuroendocrine tumor, we will proceed with biopsy to establish etiology behind the nodule.  We discussed the importance of diagnosis and staging in lung malignancies, and the approach to obtaining a tissue diagnosis which would include robotic assisted navigational bronchoscopy with endobronchial ultrasound guided sampling.  We also discussed the risks associated with the procedure which include a 2% risk of pneumothorax, infection, bleeding, and nondiagnostic procedure in detail.  I explained that patients typically are able to return home the same day of the procedure, but in rare cases admission to the hospital for observation and treatment is required.  After our discussion, the patient elected to proceed with the procedure   Raechel Chute, MD [facility] 12/31/2023 12:54 PM    End of visit medications:  Meds ordered this encounter  Medications   lactated ringers infusion   OR Linked Order Group    chlorhexidine (PERIDEX) 0.12 % solution 15 mL    Oral care mouth rinse     Current Facility-Administered Medications:    lactated ringers infusion, , Intravenous, Continuous, Lenard Simmer, MD   Subjective:   PATIENT ID: Sergio Callahan GENDER: male DOB: 10/04/1951, MRN: [id number]  No chief complaint on file.   HPI  Patient is a pleasant 73 year old male presenting for biopsy of a RLL nodule.  Patient was in his usual state of health and was noted to have a right lower lobe nodule that was grown on interval CT scan performed July 1st, 2024.  He was seen by oncology with Dr. Cathie Hoops where he is following for  gastric neuroendocrine cancer. 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 He was referred to pulmonology and biopsy was performed with robotic assisted navigational bronchoscopy on 07/18/2023 with cytology returning non-diagnostic. He's had repeat imaging with possible mild growth in the size of the nodule and is presenting for repeat biopsy today.  Patient continues to be in his usual state of health and denies any respiratory symptoms. He is not on anticoagulation besides aspirin.    Patient reports a history of smoking, he quit a year ago, started smoking at the age of 44.  He probably has 30 to 40 pack years of smoking history.  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  He now works part-time jobs but denies any exposures.  Ancillary information including prior medications, full medical/surgical/family/social histories, and PFTs (when available) are listed below and have been reviewed.   Review of Systems  Constitutional:  Negative for chills, fever and weight loss.  Respiratory:  Negative for cough, hemoptysis and shortness of breath.   Cardiovascular:  Negative for chest pain.     Objective:   Vitals:   12/31/23 1246  BP: (!) 135/95  Pulse: 84  Resp: 16  Temp: 98 F (36.7 C)  TempSrc: Temporal  SpO2: 99%   99% on RA BMI Readings from Last 3 Encounters:  12/25/23 26.85 kg/m  12/23/23 26.56 kg/m  07/18/23 26.45 kg/m   Wt Readings from Last 3 Encounters:  12/25/23 89.8 kg  12/23/23 88.8 kg  07/18/23 88.5 kg    Physical Exam Constitutional:      Appearance: Normal appearance.  Cardiovascular:     Rate and Rhythm: Normal rate and regular rhythm.     Pulses: Normal pulses.     Heart sounds: Normal heart sounds.  Pulmonary:     Effort: Pulmonary effort is normal.     Breath sounds: Normal breath sounds.  Neurological:     General: No focal deficit present.      Mental Status: He is alert.       Ancillary Information    Past Medical History:  Diagnosis Date   AAA (abdominal aortic aneurysm) (HCC)    Abdominal aortic aneurysm without rupture (HCC)    Adenomatous colon polyp    Allergic rhinitis    Anemia    Anogenital (venereal) warts    Anxiety    a.) on BZO PRN (alprazolam)   Aortic atherosclerosis (HCC)    Arthritis    B12 deficiency    Back pain    Bilateral adrenal adenomas    BPH (benign prostatic hyperplasia)    Carpal tunnel syndrome    Celiac artery atherosclerosis    Coronary artery disease    a.) MV 02/26/2005: no ischemia; b.) MV 11/24/2010: mild inferosep ischemia; c.) LHC/PCI 12/08/2010: 99% pLAD --> rotational athrectomy + 3.0 x 23 mm Xience DES x 1; d.) MV 02/15/2011: no ischemia; e.) MV 02/14/2018: no ischemia   Degenerative joint disease    DOE (dyspnea on exertion)    Duodenitis    Elevated liver enzymes 05/2023   Erectile dysfunction    a.) on PDE5i (tadalafil)   GERD (gastroesophageal reflux disease)    Hepatic steatosis    History of shingles    History of tobacco abuse    HPV in male    Hx of adenomatous colonic polyps    Hyperlipidemia    Hypertension    Hyponatremia    Iliac artery aneurysm (HCC)    Long term (current) use of aspirin    Long term current use of immunosuppressive drug    a.) Tx'd with azathioprine + hydroxychloroquine for SLE   Lumbar stenosis    MGUS (monoclonal gammopathy of unknown significance)    Multiple pulmonary nodules determined by computed tomography of lung    Neuroendocrine carcinoma of stomach (HCC) 2023   Nocturia    Nodule of lower lobe of right lung 06/2023   Paresthesia of left lower extremity    Peripheral vascular disease (HCC)    Right lower lobe pulmonary nodule 06/20/2021   a.) Dotatate PET 06/20/2021: posterior RLL 0.6 x 0.5 mm: b.) LDCT chest 07/27/2022: 0.8 x 0.6 mm; c.) Dotatate PET 02/04/2023: 0.7 x 0.7 mm; d.) CT chest 05/13/2023: 8 x 11 mm; e.)  super D chest CT 07/17/2023: spiclated morphology 9 x 8 mm; f.) CT chest 12/09/2023: cavitary spiculated nodule 10 x 7 mm   Rotator cuff tendonitis    Shingles    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)    a.) Tx'd with azathioprine + hydroxychloroquine   Urinary frequency      Family History  Problem Relation Age of Onset   Leukemia Mother        dx 35s   Hyperlipidemia Father    Hypertension Father    Diabetes Father    Colon cancer Father        dx 31s   Hearing loss Father    Vascular Disease Father    Lung cancer  Brother        d. 92     Past Surgical History:  Procedure Laterality Date   BACK SURGERY     BIOPSY  08/28/2021   Procedure: BIOPSY;  Surgeon: Meridee Score, Netty Starring., MD;  Location: [facility];  Service: Gastroenterology;;   BRONCHIAL BIOPSY  07/18/2023   Procedure: BRONCHIAL BIOPSIES;  Surgeon: Raechel Chute, MD;  Location: [facility] ENDOSCOPY;  Service: Pulmonary;;   BRONCHIAL NEEDLE ASPIRATION BIOPSY  07/18/2023   Procedure: BRONCHIAL NEEDLE ASPIRATION BIOPSIES;  Surgeon: Raechel Chute, MD;  Location: [facility] ENDOSCOPY;  Service: Pulmonary;;   BRONCHIAL WASHINGS  07/18/2023   Procedure: BRONCHIAL WASHINGS;  Surgeon: Raechel Chute, MD;  Location: [facility] ENDOSCOPY;  Service: Pulmonary;;   BROW LIFT Bilateral 01/27/2021   Procedure: BLEPHAROPLASTY UPPER EYELID; W/EXCESS SKIN BROW PTOSIS REPAIR AND BLEPHAROPTOSIS REPAIR; RESECT EX BILATERAL;  Surgeon: Imagene Riches, MD;  Location: [facility]ERY [facility];  Service: Ophthalmology;  Laterality: Bilateral;   COLONOSCOPY     COLONOSCOPY WITH PROPOFOL N/A 09/30/2019   Procedure: COLONOSCOPY WITH PROPOFOL;  Surgeon: Earline Mayotte, MD;  Location: [facility] ENDOSCOPY;  Service: Endoscopy;  Laterality: N/A;   COLONOSCOPY WITH PROPOFOL N/A 05/05/2021   Procedure: COLONOSCOPY WITH PROPOFOL;  Surgeon: Regis Bill, MD;  Location: [facility] ENDOSCOPY;  Service: Endoscopy;  Laterality: N/A;   CORONARY ANGIOPLASTY WITH  STENT PLACEMENT Left 12/08/2010   Procedure: CORONARY ANGIOPLASTY WITH STENT PLACEMENT (pLAD); Procedure: [facility]   ESOPHAGOGASTRODUODENOSCOPY     ESOPHAGOGASTRODUODENOSCOPY (EGD) WITH PROPOFOL N/A 05/05/2021   Procedure: ESOPHAGOGASTRODUODENOSCOPY (EGD) WITH PROPOFOL;  Surgeon: Regis Bill, MD;  Location: [facility] ENDOSCOPY;  Service: Endoscopy;  Laterality: N/A;   ESOPHAGOGASTRODUODENOSCOPY (EGD) WITH PROPOFOL N/A 08/28/2021   Procedure: ESOPHAGOGASTRODUODENOSCOPY (EGD) WITH PROPOFOL;  Surgeon: Meridee Score Netty Starring., MD;  Location: [facility] ENDOSCOPY;  Service: Gastroenterology;  Laterality: N/A;   ESOPHAGOGASTRODUODENOSCOPY (EGD) WITH PROPOFOL N/A 08/17/2022   Procedure: ESOPHAGOGASTRODUODENOSCOPY (EGD) WITH PROPOFOL;  Surgeon: Regis Bill, MD;  Location: [facility] ENDOSCOPY;  Service: Endoscopy;  Laterality: N/A;   EUS N/A 08/28/2021   Procedure: UPPER ENDOSCOPIC ULTRASOUND (EUS) RADIAL;  Surgeon: Lemar Lofty., MD;  Location: [facility] ENDOSCOPY;  Service: Gastroenterology;  Laterality: N/A;   LUMBAR LAMINECTOMY/DECOMPRESSION MICRODISCECTOMY Left 02/03/2015   Procedure: LUMBAR LAMINECTOMY/DECOMPRESSION MICRODISCECTOMY LEFT LUMBAR FOUR-FIVE WITH REMOVAL OF SYNOVIAL CYST;  Surgeon: Tressie Stalker, MD;  Location: [facility] NEURO [facility]S;  Service: Neurosurgery;  Laterality: Left;   NASAL SEPTOPLASTY W/ TURBINOPLASTY Bilateral 10/23/2019   Procedure: NASAL SEPTOPLASTY WITH SUBMUCOUS RESECTION OF TURBINATE;  Surgeon: Linus Salmons, MD;  Location: [facility]ERY [facility];  Service: ENT;  Laterality: Bilateral;   PELVIC ANGIOGRAPHY N/A 05/02/2018   Procedure: PELVIC ANGIOGRAPHY;  Surgeon: Renford Dills, MD;  Location: [facility] INVASIVE CV LAB;  Service: Cardiovascular;  Laterality: N/A;   POLYPECTOMY  08/28/2021   Procedure: POLYPECTOMY;  Surgeon: Meridee Score Netty Starring., MD;  Location: [facility];  Service: Gastroenterology;;   SHOULDER ARTHROSCOPY WITH SUBACROMIAL DECOMPRESSION AND BICEP TENDON REPAIR  Right 12/04/2018   Procedure: SHOULDER ARTHROSCOPY WITH DEBRIDEMENT, DECOMPRESSION, BICEP TENODESIS;  Surgeon: Christena Flake, MD;  Location: [facility] ORS;  Service: Orthopedics;  Laterality: Right;   SUBMUCOSAL INJECTION  08/28/2021   Procedure: SUBMUCOSAL INJECTION;  Surgeon: Meridee Score Netty Starring., MD;  Location: [facility];  Service: Gastroenterology;;    Social History   Socioeconomic History   Marital status: Married    Spouse name: Not on file   Number of children: Not on file   Years of education: Not on file   Highest  education level: Not on file  Occupational History   Not on file  Tobacco Use   Smoking status: Former    Current packs/day: 0.00    Average packs/day: 1.5 packs/day for 30.0 years (45.0 ttl pk-yrs)    Types: Cigarettes    Start date: 04/01/1991    Quit date: 03/31/2021    Years since quitting: 2.7   Smokeless tobacco: Never  Vaping Use   Vaping status: Never Used  Substance and Sexual Activity   Alcohol use: Yes    Alcohol/week: 14.0 standard drinks of alcohol    Types: 14 Standard drinks or equivalent per week    Comment: cocktails about 2 daily   Drug use: No   Sexual activity: Yes  Other Topics Concern   Not on file  Social History Narrative   Not on file   Social Drivers of Health   Financial Resource Strain: Not on file  Food Insecurity: Not on file  Transportation Needs: Not on file  Physical Activity: Not on file  Stress: Not on file  Social Connections: Not on file  Intimate Partner Violence: Not on file     Allergies  Allergen Reactions   Fosamax [Alendronate Sodium]     Leg pain   Percocet [Oxycodone-Acetaminophen] Nausea And Vomiting     CBC    Component Value Date/Time   WBC 6.1 04/22/2023 1519   RBC 4.80 04/22/2023 1519   HGB 15.5 04/22/2023 1519   HCT 46.2 04/22/2023 1519   PLT 251 04/22/2023 1519   MCV 96.3 04/22/2023 1519   MCH 32.3 04/22/2023 1519   MCHC 33.5 04/22/2023 1519   RDW 13.1 04/22/2023 1519    LYMPHSABS 1.2 01/14/2023 0938   MONOABS 0.4 01/14/2023 0938   EOSABS 0.1 01/14/2023 0938   BASOSABS 0.0 01/14/2023 1610    Pulmonary Functions Testing Results:     No data to display          @ENCMEDSTART @

## 2024-01-01 ENCOUNTER — Ambulatory Visit: Payer: PRIVATE HEALTH INSURANCE

## 2024-01-01 LAB — SURGICAL PATHOLOGY

## 2024-01-01 NOTE — Anesthesia Postprocedure Evaluation (Signed)
 Anesthesia Post Note  Patient: Sergio Callahan  Procedure(s) Performed: ROBOTIC ASSISTED NAVIGATIONAL BRONCHOSCOPY (Bilateral)  Patient location during evaluation: PACU Anesthesia Type: General Level of consciousness: awake and alert Pain management: pain level controlled Vital Signs Assessment: post-procedure vital signs reviewed and stable Respiratory status: spontaneous breathing, nonlabored ventilation and respiratory function stable Cardiovascular status: blood pressure returned to baseline and stable Postop Assessment: no apparent nausea or vomiting Anesthetic complications: no   No notable events documented.   Last Vitals:  Vitals:   12/31/23 1500 12/31/23 1511  BP: 111/71 99/78  Pulse: 77 78  Resp: (!) 22 20  Temp:  (!) 36.1 C  SpO2: 92% 100%    Last Pain:  Vitals:   12/31/23 1511  TempSrc: Temporal  PainSc: 0-No pain                 Foye Deer

## 2024-01-02 ENCOUNTER — Ambulatory Visit
Admit: 2024-01-02 | Payer: PRIVATE HEALTH INSURANCE | Admitting: Student in an Organized Health Care Education/Training Program

## 2024-01-02 LAB — CYTOLOGY - NON PAP

## 2024-01-02 SURGERY — BRONCHOSCOPY, WITH BIOPSY USING ELECTROMAGNETIC NAVIGATION
Anesthesia: General | Laterality: Bilateral

## 2024-01-07 ENCOUNTER — Ambulatory Visit: Payer: Self-pay | Admitting: General Surgery

## 2024-01-07 NOTE — H&P (Signed)
 History of Present Illness Sergio Callahan is a 73 year old male who presents with large condyloma. He was referred by a dermatologist for evaluation of skin tags and possible precancerous lesions.  He has had skin tags on his bottom for a while, which have become uncomfortable, especially during bathroom use and prolonged sitting. There is occasional irritation and minor bleeding after bowel movements, but no significant pain. He has not used any creams but occasionally applies powder to alleviate itching.  He has a history of skin issues and frequently visits a dermatologist for procedures such as cutting, freezing, or removing lesions. He also has a history of multiple stents due to partial blockages, with a procedure scheduled for a stent placement behind his left knee.  He takes aspirin regularly and has a history of sensitivity to certain pain medications, including Percocet and oxycodone, which cause stomach upset. He has tolerated extra strength Tylenol and Tylenol with codeine in the past.  He works part-time, involving driving and lifting heavy objects, and is concerned about post-procedure recovery time affecting his ability to work. He plans to take time off work to accommodate recovery. He also mentions a planned vacation in early April and is considering the timing of his procedure accordingly.      PAST MEDICAL HISTORY:  Past Medical History:  Diagnosis Date   AAA (abdominal aortic aneurysm) without rupture (CMS-HCC)    Allergic rhinitis    Anemia    Anogenital (venereal) warts    Anxiety    Arthritis    B12 deficiency    Carpal tunnel syndrome    Celiac artery atherosclerosis    Collagen vascular disease (CMS/HHS-HCC)    Connective tissue disease (CMS/HHS-HCC)    Coronary artery disease    Degenerative joint disease    Duodenitis    Erectile dysfunction    GERD (gastroesophageal reflux disease)    History of shingles    HPV in male    Hx of adenomatous colonic polyps     Hyperlipidemia    Hypertension    Hyponatremia    Lupus    Monoclonal gammopathy    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/2004   Substance abuse (CMS/HHS-HCC)    cigarette        PAST SURGICAL HISTORY:   Past Surgical History:  Procedure Laterality Date   COLONOSCOPY  06/18/2005   Adenomatous Polyps, FH Colon Polyps (Father)   cardiac catheterization  2012   EGD  10/19/2011   08/13/2008, 06/18/2005; No repeat per RTE   COLONOSCOPY  01/01/2014   08/13/2008; PH Adenomatous Polyps, FHCC (Father), FH Colon Polyps (Father): [facility] 08/2019: Recall ltr mailed    lumbar laminectomy/decompression microdiscectomy Left 02/03/2015   ANGIOGRAPHY PELVIC  05/02/2018   Limited arthroscopic debridement arthroscopic subacromial decompression mini-open rotator cuff repair using a smith & nepew regeneten patch and mini-open biceps tenodesis,right shoulder  Right 12/04/2018   Dr.poggi    COLONOSCOPY  09/30/2019   Normal Colon/PHx CP/Repeat 13yrs/JWB   nasal septoplasty with turbinoplasty Bilateral 10/23/2019   LIFT / REPAIR BROW PTOSIS FOREHEAD Bilateral 01/27/2021   COLONOSCOPY  05/05/2021   Tubular adenomas/Hyperplastic polyps/Repeat 50yrs/CTL   EGD  05/05/2021   5 mm neuroendocrine tumour in stomach that was removed/Will likely need repeat EGD at some point but timing to be determined/CTL   EGD  08/28/2021   [facility]-   PERCUTANEOUS BIOPSY BREAST Left 10/24/2021   Benign mammary parenchyma with gynecomastia.  No atypia.   EGD  08/17/2022  fundic gland polyps/Normal EGD biopsy/Repeat 11yrs/CTL   CORONARY ANGIOPLASTY     ENDOSCOPIC CARPAL TUNNEL RELEASE Right    Left wrist fracture repair Left    MOHS SURGERY     Dr. Lorn Junes         MEDICATIONS:  Outpatient Encounter Medications as of 01/07/2024  Medication Sig Dispense Refill   ALPRAZolam (XANAX) 0.5 MG tablet TAKE 1 TABLET (0.5 MG TOTAL) BY MOUTH 3 (THREE) TIMES DAILY AS NEEDED FOR ANXIETY. 90 tablet 4   amLODIPine (NORVASC)  2.5 MG tablet Take 1 tablet (2.5 mg total) by mouth once daily 90 tablet 3   aspirin 81 MG EC tablet Take 81 mg by mouth once daily.     azaTHIOprine (IMURAN) 50 mg tablet TAKE 2 AND 1/2 TABLETS BY MOUTH ONCE DAILY 225 tablet 1   BD INTEGRA SYRINGE 3 mL 25 gauge x 1" Syrg Inject 1 each into the muscle every 14 (fourteen) days 12 Syringe 0   cetirizine (ZYRTEC) 10 MG tablet Take 10 mg by mouth once daily as needed     clopidogreL (PLAVIX) 75 mg tablet Take 75 mg by mouth once daily     cyanocobalamin (VITAMIN B12) 1,000 mcg/mL injection INJECT 1 ML (1,000 MCG TOTAL) INTO THE MUSCLE EVERY 14 (FOURTEEN) DAYS 6 mL 0   finasteride (PROSCAR) 5 mg tablet TAKE 1 TABLET BY MOUTH ONCE DAILY 90 tablet 1   fluticasone propionate (FLONASE) 50 mcg/actuation nasal spray Place 1 spray into both nostrils 2 (two) times daily 16 g 0   hydroCHLOROthiazide (HYDRODIURIL) 12.5 MG tablet TAKE 1 TABLET BY MOUTH EVERY DAY 90 tablet 1   hydroxychloroquine (PLAQUENIL) 200 mg tablet Take 1 tablet (200 mg total) by mouth 2 (two) times daily 180 tablet 1   ibuprofen (ADVIL,MOTRIN) 600 MG tablet Take 600 mg by mouth.     lisinopriL (ZESTRIL) 40 MG tablet TAKE 1 TABLET BY MOUTH EVERY DAY 90 tablet 3   metoprolol succinate (TOPROL-XL) 50 MG XL tablet Take 1 tablet (50 mg total) by mouth once daily 90 tablet 3   omeprazole (PRILOSEC) 20 MG DR capsule Take 20 mg by mouth once daily.     pravastatin (PRAVACHOL) 40 MG tablet TALE 1 TABLET BY MOUTH AT BEDTIME 90 tablet 2   promethazine-dextromethorphan (PROMETHAZINE-DM) 6.25-15 mg/5 mL syrup Take 5 mLs by mouth every 6 (six) hours as needed 120 mL 0   sildenafil (REVATIO) 20 mg tablet Take 20 mg by mouth as needed     spironolactone (ALDACTONE) 25 MG tablet Take 25 mg by mouth once daily     tadalafiL (CIALIS) 20 MG tablet Take 20 mg by mouth once daily as needed     tamsulosin (FLOMAX) 0.4 mg capsule TAKE 1 CAP BY MOUTH ONCE DAILY 90 capsule 2   No facility-administered encounter  medications on file as of 01/07/2024.     ALLERGIES:   Fosamax [alendronate] and Percocet [oxycodone-acetaminophen]   SOCIAL HISTORY:  Social History   Socioeconomic History   Marital status: Married  Occupational History   Occupation: Retired  Tobacco Use   Smoking status: Former    Current packs/day: 0.00    Average packs/day: 1.5 packs/day for 30.0 years (45.0 ttl pk-yrs)    Types: Cigarettes    Start date: 04/01/1991    Quit date: 03/31/2021    Years since quitting: 2.7    Passive exposure: Past   Smokeless tobacco: Never  Vaping Use   Vaping status: Never Used  Substance and Sexual  Activity   Alcohol use: Yes    Alcohol/week: 0.0 standard drinks of alcohol    Comment: occasionally   Drug use: No   Sexual activity: Defer    FAMILY HISTORY:  Family History  Problem Relation Name Age of Onset   Cancer Mother     Stroke Mother     Cataracts Mother     Leukemia Mother     High blood pressure (Hypertension) Father     Hyperlipidemia (Elevated cholesterol) Father     Colon polyps Father     Diabetes Father     Cancer Father     Cataracts Father     Colon cancer Father     Hearing loss Father     Breast cancer Neg Hx       GENERAL REVIEW OF SYSTEMS:   General ROS: negative for - chills, fatigue, fever, weight gain or weight loss Allergy and Immunology ROS: negative for - hives  Hematological and Lymphatic ROS: negative for - bleeding problems or bruising, negative for palpable nodes Endocrine ROS: negative for - heat or cold intolerance, hair changes Respiratory ROS: negative for - cough, shortness of breath or wheezing Cardiovascular ROS: no chest pain or palpitations GI ROS: negative for nausea, vomiting, abdominal pain, diarrhea, constipation Musculoskeletal ROS: negative for - joint swelling or muscle pain Neurological ROS: negative for - confusion, syncope Dermatological ROS: negative for pruritus and rash  PHYSICAL EXAM:  Vitals:   01/07/24 1033  BP:  125/80  Pulse: 93  .  Ht:182.9 cm (6') Wt:88.5 kg (195 lb) ZOX:WRUE surface area is 2.12 meters squared. Body mass index is 26.45 kg/m.Marland Kitchen   GENERAL: Alert, active, oriented x3  HEENT: Pupils equal reactive to light. Extraocular movements are intact. Sclera clear. Palpebral conjunctiva normal red color.Pharynx clear.  NECK: Supple with no palpable mass and no adenopathy.  LUNGS: Sound clear with no rales rhonchi or wheezes.  HEART: Regular rhythm S1 and S2 without murmur.  ABDOMEN: Soft and depressible, nontender with no palpable mass, no hepatomegaly.   RECTAL: Multiple large polypoid anal tags there is a lot of skin cyst surrounding the anal area.  EXTREMITIES: Well-developed well-nourished symmetrical with no dependent edema.  NEUROLOGICAL: Awake alert oriented, facial expression symmetrical, moving all extremities.  Assessment & Plan Skin Tags Presents with multiple perianal skin tags and cysts, causing discomfort during bowel movements and prolonged sitting. The dermatologist is concerned about potential precancerous lesions. Examination reveals multiple skin tags and cysts, some resembling condyloma. Most tags are benign, but some may be precancerous. Cysts are benign but may recur. Surgery will involve wide excision of tags and cleaning of cysts. Discussed potential for cancer, post-surgery impact on daily activities, and post-operative care. Schedule surgery for excision of skin tags and cleaning of cysts. Send excised tissue to pathology for malignancy analysis. Advise two weeks off from work post-surgery. Recommend gauze for post-operative oozing. Advise on managing bowel movements to avoid constipation. Discuss pain management options avoiding Percocet and oxycodone due to intolerance. Plan surgery on a Friday for wife's availability for transportation.  Cysts Multiple perianal cysts, distinct from skin tags, are causing discomfort. Cysts are benign but may recur. Squeezing and  cleaning may not prevent recurrence due to pockets. Clean cysts during scheduled surgery. Inform about the possibility of cyst recurrence. Advise on post-operative care to minimize discomfort and promote healing.  Follow-up Schedule a follow-up appointment post-surgery to assess healing and pathology results. Ensure surgery is scheduled soon to allow recovery before vacation.  Anal skin tag [K64.4]          Patient verbalized understanding, all questions were answered, and were agreeable with the plan outlined above.   Carolan Shiver, MD  Electronically signed by Carolan Shiver, MD

## 2024-01-10 ENCOUNTER — Encounter: Payer: Self-pay | Admitting: Vascular Surgery

## 2024-01-10 ENCOUNTER — Encounter
Admission: RE | Disposition: A | Discharge status: Home / Self Care | Payer: Self-pay | Source: Home / Self Care | Attending: Vascular Surgery

## 2024-01-10 ENCOUNTER — Ambulatory Visit
Admission: RE | Admit: 2024-01-10 | Discharge: 2024-01-10 | Disposition: A | Discharge status: Home / Self Care | Payer: Medicare Other | Attending: Vascular Surgery | Admitting: Vascular Surgery

## 2024-01-10 ENCOUNTER — Other Ambulatory Visit: Payer: Self-pay

## 2024-01-10 DIAGNOSIS — I2510 Atherosclerotic heart disease of native coronary artery without angina pectoris: Secondary | ICD-10-CM | POA: Insufficient documentation

## 2024-01-10 DIAGNOSIS — I771 Stricture of artery: Secondary | ICD-10-CM

## 2024-01-10 DIAGNOSIS — I70223 Atherosclerosis of native arteries of extremities with rest pain, bilateral legs: Secondary | ICD-10-CM | POA: Diagnosis present

## 2024-01-10 DIAGNOSIS — Z9889 Other specified postprocedural states: Secondary | ICD-10-CM

## 2024-01-10 DIAGNOSIS — I7143 Infrarenal abdominal aortic aneurysm, without rupture: Secondary | ICD-10-CM | POA: Insufficient documentation

## 2024-01-10 DIAGNOSIS — Z87891 Personal history of nicotine dependence: Secondary | ICD-10-CM | POA: Diagnosis not present

## 2024-01-10 DIAGNOSIS — I10 Essential (primary) hypertension: Secondary | ICD-10-CM | POA: Insufficient documentation

## 2024-01-10 DIAGNOSIS — Z8249 Family history of ischemic heart disease and other diseases of the circulatory system: Secondary | ICD-10-CM | POA: Diagnosis not present

## 2024-01-10 DIAGNOSIS — E785 Hyperlipidemia, unspecified: Secondary | ICD-10-CM | POA: Diagnosis not present

## 2024-01-10 DIAGNOSIS — Z95828 Presence of other vascular implants and grafts: Secondary | ICD-10-CM | POA: Diagnosis not present

## 2024-01-10 DIAGNOSIS — I70222 Atherosclerosis of native arteries of extremities with rest pain, left leg: Secondary | ICD-10-CM

## 2024-01-10 DIAGNOSIS — I723 Aneurysm of iliac artery: Secondary | ICD-10-CM | POA: Insufficient documentation

## 2024-01-10 DIAGNOSIS — I700 Atherosclerosis of aorta: Secondary | ICD-10-CM | POA: Diagnosis not present

## 2024-01-10 DIAGNOSIS — I70219 Atherosclerosis of native arteries of extremities with intermittent claudication, unspecified extremity: Secondary | ICD-10-CM

## 2024-01-10 HISTORY — PX: LOWER EXTREMITY ANGIOGRAPHY: CATH118251

## 2024-01-10 LAB — CREATININE, SERUM
Creatinine, Ser: 0.77 mg/dL (ref 0.61–1.24)
GFR, Estimated: >60 mL/min (ref >60)

## 2024-01-10 LAB — BUN: BUN: 14 mg/dL (ref 8–23)

## 2024-01-10 SURGERY — LOWER EXTREMITY ANGIOGRAPHY
Anesthesia: Moderate Sedation | Site: Leg Lower | Laterality: Left

## 2024-01-10 MED ORDER — SODIUM CHLORIDE 0.9% FLUSH: 3.0000 mL | INTRAVENOUS | Status: DC | PRN

## 2024-01-10 MED ORDER — HEPARIN (PORCINE) IN NACL 2000-0.9 UNIT/L-% IV SOLN
INTRAVENOUS | Status: DC | PRN
  Administered 2024-01-10: 1000 mL

## 2024-01-10 MED ORDER — LIDOCAINE-EPINEPHRINE (PF) 1 %-1:200000 IJ SOLN
INTRAMUSCULAR | Status: DC | PRN
  Administered 2024-01-10: 10 mL

## 2024-01-10 MED ORDER — SODIUM CHLORIDE 0.9 % IV SOLN: INTRAVENOUS | Status: DC

## 2024-01-10 MED ORDER — ONDANSETRON HCL 4 MG/2ML IJ SOLN: 4.0000 mg | Freq: Four times a day (QID) | INTRAMUSCULAR | Status: DC | PRN

## 2024-01-10 MED ORDER — FENTANYL CITRATE PF 50 MCG/ML IJ SOSY
PREFILLED_SYRINGE | INTRAMUSCULAR | Status: AC
  Filled 2024-01-10: qty 1

## 2024-01-10 MED ORDER — FENTANYL CITRATE (PF) 100 MCG/2ML IJ SOLN
INTRAMUSCULAR | Status: AC
  Filled 2024-01-10: qty 2

## 2024-01-10 MED ORDER — FAMOTIDINE 20 MG PO TABS
40.0000 mg | ORAL_TABLET | Freq: Once | ORAL | Status: DC | PRN
Start: 2024-01-10 — End: 2024-01-10

## 2024-01-10 MED ORDER — CEFAZOLIN SODIUM-DEXTROSE 2-4 GM/100ML-% IV SOLN
2.0000 g | INTRAVENOUS | Status: AC
  Administered 2024-01-10: 2 g via INTRAVENOUS

## 2024-01-10 MED ORDER — IODIXANOL 320 MG/ML IV SOLN
INTRAVENOUS | Status: DC | PRN
  Administered 2024-01-10: 75 mL via INTRA_ARTERIAL

## 2024-01-10 MED ORDER — HYDRALAZINE HCL 20 MG/ML IJ SOLN: 5.0000 mg | INTRAMUSCULAR | Status: DC | PRN

## 2024-01-10 MED ORDER — MORPHINE SULFATE (PF) 4 MG/ML IV SOLN: 2.0000 mg | INTRAVENOUS | Status: DC | PRN

## 2024-01-10 MED ORDER — SODIUM CHLORIDE 0.9% FLUSH
3.0000 mL | Freq: Two times a day (BID) | INTRAVENOUS | Status: DC
Start: 2024-01-10 — End: 2024-01-10

## 2024-01-10 MED ORDER — METHYLPREDNISOLONE SODIUM SUCC 125 MG IJ SOLR: 125.0000 mg | Freq: Once | INTRAMUSCULAR | Status: DC | PRN

## 2024-01-10 MED ORDER — MIDAZOLAM HCL 2 MG/2ML IJ SOLN
INTRAMUSCULAR | Status: AC
  Filled 2024-01-10: qty 2

## 2024-01-10 MED ORDER — HYDROMORPHONE HCL 1 MG/ML IJ SOLN: 1.0000 mg | Freq: Once | INTRAMUSCULAR | Status: DC | PRN

## 2024-01-10 MED ORDER — PANTOPRAZOLE SODIUM 20 MG PO TBEC: 20.0000 mg | DELAYED_RELEASE_TABLET | Freq: Every day | ORAL | 11 refills | Status: DC

## 2024-01-10 MED ORDER — HEPARIN SODIUM (PORCINE) 1000 UNIT/ML IJ SOLN
INTRAMUSCULAR | Status: AC
  Filled 2024-01-10: qty 10

## 2024-01-10 MED ORDER — CEFAZOLIN SODIUM-DEXTROSE 2-4 GM/100ML-% IV SOLN
INTRAVENOUS | Status: AC
  Filled 2024-01-10: qty 100

## 2024-01-10 MED ORDER — DIPHENHYDRAMINE HCL 50 MG/ML IJ SOLN: 50.0000 mg | Freq: Once | INTRAMUSCULAR | Status: DC | PRN

## 2024-01-10 MED ORDER — MIDAZOLAM HCL 2 MG/ML PO SYRP: 8.0000 mg | ORAL_SOLUTION | Freq: Once | ORAL | Status: DC | PRN

## 2024-01-10 MED ORDER — HEPARIN SODIUM (PORCINE) 1000 UNIT/ML IJ SOLN
INTRAMUSCULAR | Status: DC | PRN
  Administered 2024-01-10: 6000 [IU] via INTRAVENOUS

## 2024-01-10 MED ORDER — MIDAZOLAM HCL 2 MG/2ML IJ SOLN
INTRAMUSCULAR | Status: DC | PRN
  Administered 2024-01-10: 1 mg via INTRAVENOUS
  Administered 2024-01-10: 2 mg via INTRAVENOUS
  Administered 2024-01-10: 1 mg via INTRAVENOUS

## 2024-01-10 MED ORDER — CLOPIDOGREL BISULFATE 300 MG PO TABS: 300.0000 mg | ORAL_TABLET | ORAL | Status: DC

## 2024-01-10 MED ORDER — SODIUM CHLORIDE 0.9 % IV SOLN: 250.0000 mL | INTRAVENOUS | Status: DC | PRN

## 2024-01-10 MED ORDER — FENTANYL CITRATE (PF) 100 MCG/2ML IJ SOLN
INTRAMUSCULAR | Status: DC | PRN
  Administered 2024-01-10 (×3): 50 ug via INTRAVENOUS

## 2024-01-10 MED ORDER — LABETALOL HCL 5 MG/ML IV SOLN: 10.0000 mg | INTRAVENOUS | Status: DC | PRN

## 2024-01-10 MED ORDER — CLOPIDOGREL BISULFATE 75 MG PO TABS: 75.0000 mg | ORAL_TABLET | Freq: Every day | ORAL | 11 refills | Status: DC

## 2024-01-10 SURGICAL SUPPLY — 27 items
BALLN LUTONIX 018 4X60X130 (BALLOONS) ×1 IMPLANT
BALLN LUTONIX 018 6X80X130 (BALLOONS) ×1 IMPLANT
BALLOON LUTONIX 018 4X60X130 (BALLOONS) IMPLANT
BALLOON LUTONIX 018 6X80X130 (BALLOONS) IMPLANT
CATH ANGIO 5F PIGTAIL 65CM (CATHETERS) IMPLANT
CATH TEMPO 5F RIM 65CM (CATHETERS) IMPLANT
CATH VERT 5FR 125CM (CATHETERS) IMPLANT
COVER PROBE ULTRASOUND 5X96 (MISCELLANEOUS) IMPLANT
DEVICE PRESTO INFLATION (MISCELLANEOUS) IMPLANT
DEVICE STARCLOSE SE CLOSURE (Vascular Products) IMPLANT
DEVICE TORQUE (MISCELLANEOUS) IMPLANT
DRAPE BRACHIAL (DRAPES) IMPLANT
GLIDEWIRE ADV .035X180CM (WIRE) IMPLANT
GLIDEWIRE ADV .035X260CM (WIRE) IMPLANT
GOWN STRL REUS W/ TWL LRG LVL3 (GOWN DISPOSABLE) ×1 IMPLANT
GUIDEWIRE PFTE-COATED .018X300 (WIRE) IMPLANT
NDL ENTRY 21GA 7CM ECHOTIP (NEEDLE) IMPLANT
NEEDLE ENTRY 21GA 7CM ECHOTIP (NEEDLE) ×1 IMPLANT
PACK ANGIOGRAPHY (CUSTOM PROCEDURE TRAY) ×1 IMPLANT
SET INTRO CAPELLA COAXIAL (SET/KITS/TRAYS/PACK) IMPLANT
SHEATH BRITE TIP 5FRX11 (SHEATH) IMPLANT
SHEATH BRITE TIP 6FRX11 (SHEATH) IMPLANT
SHEATH HIGHFLEX ANSEL 6FRX55 (SHEATH) IMPLANT
STENT LIFESTENT 5F 7X80X135 (Permanent Stent) IMPLANT
SYR MEDRAD MARK 7 150ML (SYRINGE) IMPLANT
TUBING CONTRAST HIGH PRESS 72 (TUBING) IMPLANT
WIRE J 3MM .035X145CM (WIRE) IMPLANT

## 2024-01-10 NOTE — Discharge Instructions (Signed)

## 2024-01-10 NOTE — Interval H&P Note (Signed)
 History and Physical Interval Note:  01/10/2024 10:20 AM  Sergio Callahan  has presented today for surgery, with the diagnosis of LLE Angio   ASO w claudication.  The various methods of treatment have been discussed with the patient and family. After consideration of risks, benefits and other options for treatment, the patient has consented to  Procedure(s): Lower Extremity Angiography (Left) as a surgical intervention.  The patient's history has been reviewed, patient examined, no change in status, stable for surgery.  I have reviewed the patient's chart and labs.  Questions were answered to the patient's satisfaction.     Levora Dredge

## 2024-01-10 NOTE — Op Note (Signed)
 [facility]  Percutaneous Study/Intervention Procedural Note   Date of Surgery: 01/10/2024  Surgeon:  Renford Dills, MD.  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  Introduction catheter into left lower extremity 3rd order catheter placement              2.    Contrast injection left lower extremity for distal runoff             3.  Percutaneous transluminal angioplasty and stent placement left popliteal artery mid segment.               4.  Star close closure right common femoral arteriotomy  Anesthesia: Conscious sedation was administered under my direct supervision by the interventional radiology RN. IV Versed plus fentanyl were utilized. Continuous ECG, pulse oximetry and blood pressure was monitored throughout the entire procedure.  Conscious sedation was for a total of 62 minutes.  Sheath: 6 French HyFlex Ansell right common femoral retrograde  Contrast: 75 cc  Fluoroscopy Time: 12.5 minutes  Indications:  Sergio Callahan presents with increasing pain in his left foot.  He now states he can barely walk around the house or carry out his daily activities.  He is also having increased pain as a baseline suggesting rest pain symptoms.  In the office his noninvasive studies have shown deterioration and his physical examination is consistent with advanced atherosclerotic occlusive disease.  Angiography for limb salvage has been recommended.  The risks and benefits are reviewed all questions answered patient agrees to proceed.  Procedure:  Sergio Callahan is a 73 y.o. y.o. male who was identified and appropriate procedural time out was performed.  The patient was then placed supine on the table and prepped and draped in the usual sterile fashion.    Ultrasound was placed  in the sterile sleeve and the right groin was evaluated the right common femoral artery was echolucent and pulsatile indicating patency.  Image was recorded for the permanent record and under real-time visualization a microneedle was inserted into the common femoral artery microwire followed by a micro-sheath.  A J-wire was then advanced through the micro-sheath and a  5 [place] sheath was then inserted over a J-wire. J-wire was then advanced and a 5 French pigtail catheter was positioned at the level of T12. AP projection of the aorta was then obtained. Pigtail catheter was repositioned to above the bifurcation and  bilateral oblique views of the pelvis was obtained.  Subsequently a ramp catheter with the stiff angle Glidewire was used to cross the aortic bifurcation the catheter wire were advanced down into the left distal external iliac artery. Oblique view of the femoral bifurcation was then obtained and subsequently the wire was reintroduced and the pigtail catheter negotiated into the SFA representing third order catheter placement. Distal runoff was then performed.  6000 units of heparin was then given and allowed to circulate and a 6 [place] HyFlex Ansell sheath was advanced up and over the bifurcation and positioned in the superficial femoral artery  KMP  catheter and an 018 advantage Glidewire was then negotiated through the subtotal occlusion of the mid popliteal down into the distal popliteal.   A 4 mm x 60 mm Lutonix drug-eluting balloon was used to angioplasty the popliteal artery.  Inflation  was to 8 atmospheres for 1 minute.  Follow-up imaging demonstrated a greater than 80% residual stenosis and therefore a 7 mm x 80 mm Lifestream is deployed across the lesion is then postdilated with a 6 mm x 80 mm Lutonix drug-eluting balloon inflated to 10 atm for 1 minute.  Follow-up imaging demonstrates approximately 30% residual stenosis in the midportion of the worst most dense area of calcification.   Three-vessel runoff is preserved.  At this point rather than risk rupture of the popliteal artery with inflation to 7 mm, I elected to terminate the case.  After review of these images the sheath is pulled into the right external iliac oblique of the common femoral is obtained and a Star close device deployed. There no immediate complications.   Findings:  The abdominal aorta is opacified with a bolus injection contrast. Renal arteries are single and patent. The aorta itself has diffuse disease but no hemodynamically significant lesions. The common iliac arteries demonstrate previously placed stents in the origins.  The bifurcation of the aorta appears to have been elevated 3 to 5 mm allowing for access from the right to the left.  The common iliac arteries are widely patent the external iliac arteries are patent bilaterally but there is profound tortuosity noted on the right and moderate tortuosity of the left.  The left common femoral is widely patent as is the profunda femoris.  The SFA shows increasing disease but there are no hemodynamically significant stenosis.  The popliteal artery is patent and free of hemodynamically significant stenosis in the above-knee segment however behind the knee at the level of the joint space there is densely bulky calcified plaque in is eccentric and in multiple nodules.  This represents a subtotal occlusion.  Distal to this lesion the popliteal appears relatively normal and there is three-vessel runoff to the foot.    Following angioplasty and stent placement the popliteal artery is now patent.  There is a 30% residual stenosis in its mid segment but otherwise there is less than 5% residual stenosis throughout the stented segment.  There is preservation of three-vessel runoff to the foot.      Summary: Successful recanalization left lower extremity for limb salvage                        Disposition: Patient was taken to the recovery room in stable condition  having tolerated the procedure well.  Renford Dills 01/10/2024,12:06 PM

## 2024-01-13 ENCOUNTER — Inpatient Hospital Stay
Admission: RE | Admit: 2024-01-13 | Discharge: 2024-01-13 | Disposition: A | Discharge status: Home / Self Care | Payer: PRIVATE HEALTH INSURANCE | Source: Ambulatory Visit

## 2024-01-13 ENCOUNTER — Telehealth (INDEPENDENT_AMBULATORY_CARE_PROVIDER_SITE_OTHER): Payer: Self-pay

## 2024-01-13 HISTORY — DX: Atherosclerosis of native arteries of extremities with intermittent claudication, right leg: I70.211

## 2024-01-13 NOTE — Patient Instructions (Addendum)
 Your procedure is scheduled on: Friday 01/17/24 Report to the Registration Desk on the 1st floor of the [facility]. To find out your arrival time, please call [phone] between 1PM - 3PM on: Thursday 01/16/24 If your arrival time is 6:00 am, do not arrive before that time as the [facility] entrance doors do not open until 6:00 am.  REMEMBER: Instructions that are not followed completely may result in serious medical risk, up to and including death; or upon the discretion of your surgeon and anesthesiologist your surgery may need to be rescheduled.  Do not eat food after midnight the night before surgery.  No gum chewing or hard candies.  One week prior to surgery: Stop Anti-inflammatories (NSAIDS) such as Advil, Aleve, Ibuprofen, Motrin, Naproxen, Naprosyn and Aspirin based products such as Excedrin, Goody's Powder, BC Powder. Stop ANY OVER THE COUNTER supplements until after surgery.  You may however, continue to take Tylenol if needed for pain up until the day of surgery.  Stop using tadalafil (CIALIS) 20 MG 2 days prior to surgery (Do not use after Tuesday 01/14/24)  **Follow recommendations regarding stopping blood thinners.**  Continue taking all of your other prescription medications up until the day of surgery.  ON THE DAY OF SURGERY ONLY TAKE THESE MEDICATIONS WITH SIPS OF WATER:  ALPRAZolam (XANAX) 0.5 MG  amLODipine (NORVASC) 2.5 MG  hydroxychloroquine (PLAQUENIL) 200 MG  azaTHIOprine (IMURAN) 50 MG  pantoprazole (PROTONIX) 20 MG  tamsulosin (FLOMAX) 0.4 MG CAPS    No Alcohol for 24 hours before or after surgery.  No Smoking including e-cigarettes for 24 hours before surgery.  No chewable tobacco products for at least 6 hours before surgery.  No nicotine patches on the day of surgery.  Do not use any "recreational" drugs for at least a week (preferably 2 weeks) before your surgery.  Please be advised that the combination of cocaine and anesthesia may have negative  outcomes, up to and including death. If you test positive for cocaine, your surgery will be cancelled.  On the morning of surgery brush your teeth with toothpaste and water, you may rinse your mouth with mouthwash if you wish. Do not swallow any toothpaste or mouthwash.  Use CHG Soap or wipes as directed on instruction sheet.  Do not wear jewelry, make-up, hairpins, clips or nail polish.  For welded (permanent) jewelry: bracelets, anklets, waist bands, etc.  Please have this removed prior to surgery.  If it is not removed, there is a chance that hospital personnel will need to cut it off on the day of surgery.  Do not wear lotions, powders, or perfumes.   Do not shave body hair from the neck down 48 hours before surgery.  Contact lenses, hearing aids and dentures may not be worn into surgery.  Do not bring valuables to the hospital. [facility] is not responsible for any missing/lost belongings or valuables.   Total Shoulder Arthroplasty:  use Benzoyl Peroxide 5% Gel as directed on instruction sheet.  Bring your C-PAP to the hospital in case you may have to spend the night.   Notify your doctor if there is any change in your medical condition (cold, fever, infection).  Wear comfortable clothing (specific to your surgery type) to the hospital.  After surgery, you can help prevent lung complications by doing breathing exercises.  Take deep breaths and cough every 1-2 hours. Your doctor may order a device called an Incentive Spirometer to help you take deep breaths. When coughing or sneezing,  hold a pillow firmly against your incision with both hands. This is called "splinting." Doing this helps protect your incision. It also decreases belly discomfort.  If you are being admitted to the hospital overnight, leave your suitcase in the car. After surgery it may be brought to your room.  In case of increased patient census, it may be necessary for you, the patient, to continue your  postoperative care in the Same Day Surgery department.  If you are being discharged the day of surgery, you will not be allowed to drive home. You will need a responsible individual to drive you home and stay with you for 24 hours after surgery.   If you are taking public transportation, you will need to have a responsible individual with you.  Please call the Pre-admissions Testing Dept. at [phone] if you have any questions about these instructions.  Surgery Visitation Policy:  Patients having surgery or a procedure may have two visitors.  Children under the age of 37 must have an adult with them who is not the patient.  Temporary Visitor Restrictions Due to increasing cases of flu, RSV and COVID-19: Children ages 87 and under will not be able to visit patients in [facility] hospitals under most circumstances.  Inpatient Visitation:    Visiting hours are 7 a.m. to 8 p.m. Up to four visitors are allowed at one time in a patient room. The visitors may rotate out with other people during the day.  One visitor age 55 or older may stay with the patient overnight and must be in the room by 8 p.m.   Preparing for Surgery with CHLORHEXIDINE GLUCONATE (CHG) Soap  Chlorhexidine Gluconate (CHG) Soap  o An antiseptic cleaner that kills germs and bonds with the skin to continue killing germs even after washing  o Used for showering the night before surgery and morning of surgery  Before surgery, you can play an important role by reducing the number of germs on your skin.  CHG (Chlorhexidine gluconate) soap is an antiseptic cleanser which kills germs and bonds with the skin to continue killing germs even after washing.  Please do not use if you have an allergy to CHG or antibacterial soaps. If your skin becomes reddened/irritated stop using the CHG.  1. Shower the NIGHT BEFORE SURGERY and the MORNING OF SURGERY with CHG soap.  2. If you choose to wash your hair, wash your hair  first as usual with your normal shampoo.  3. After shampooing, rinse your hair and body thoroughly to remove the shampoo.  4. Use CHG as you would any other liquid soap. You can apply CHG directly to the skin and wash gently with a scrungie or a clean washcloth.  5. Apply the CHG soap to your body only from the neck down. Do not use on open wounds or open sores. Avoid contact with your eyes, ears, mouth, and genitals (private parts). Wash face and genitals (private parts) with your normal soap.  6. Wash thoroughly, paying special attention to the area where your surgery will be performed.  7. Thoroughly rinse your body with warm water.  8. Do not shower/wash with your normal soap after using and rinsing off the CHG soap.  9. Pat yourself dry with a clean towel.  10. Wear clean pajamas to bed the night before surgery.  12. Place clean sheets on your bed the night of your first shower and do not sleep with pets.  13. Shower again with the CHG  soap on the day of surgery prior to arriving at the hospital.  14. Do not apply any deodorants/lotions/powders.  15. Please wear clean clothes to the hospital.

## 2024-01-13 NOTE — Telephone Encounter (Signed)
 Message given

## 2024-01-13 NOTE — Telephone Encounter (Signed)
 Sergio Callahan called stating he had a stent put in the back of his left knee Friday and its achy. He would like to know is that normal and can he take Tylenol for the pain.   Please advise

## 2024-01-13 NOTE — Telephone Encounter (Signed)
 Yes it is normal to be achy and should subside.  Tylenol is fine to take

## 2024-01-14 ENCOUNTER — Inpatient Hospital Stay: Payer: Medicare Other | Attending: Oncology

## 2024-01-14 DIAGNOSIS — Z860101 Personal history of adenomatous and serrated colon polyps: Secondary | ICD-10-CM | POA: Diagnosis not present

## 2024-01-14 DIAGNOSIS — M329 Systemic lupus erythematosus, unspecified: Secondary | ICD-10-CM | POA: Diagnosis not present

## 2024-01-14 DIAGNOSIS — N400 Enlarged prostate without lower urinary tract symptoms: Secondary | ICD-10-CM | POA: Diagnosis not present

## 2024-01-14 DIAGNOSIS — R911 Solitary pulmonary nodule: Secondary | ICD-10-CM | POA: Insufficient documentation

## 2024-01-14 DIAGNOSIS — J432 Centrilobular emphysema: Secondary | ICD-10-CM | POA: Diagnosis not present

## 2024-01-14 DIAGNOSIS — I2510 Atherosclerotic heart disease of native coronary artery without angina pectoris: Secondary | ICD-10-CM | POA: Diagnosis not present

## 2024-01-14 DIAGNOSIS — I739 Peripheral vascular disease, unspecified: Secondary | ICD-10-CM | POA: Insufficient documentation

## 2024-01-14 DIAGNOSIS — E785 Hyperlipidemia, unspecified: Secondary | ICD-10-CM | POA: Insufficient documentation

## 2024-01-14 DIAGNOSIS — C7A1 Malignant poorly differentiated neuroendocrine tumors: Secondary | ICD-10-CM | POA: Insufficient documentation

## 2024-01-14 DIAGNOSIS — D125 Benign neoplasm of sigmoid colon: Secondary | ICD-10-CM | POA: Diagnosis not present

## 2024-01-14 DIAGNOSIS — I700 Atherosclerosis of aorta: Secondary | ICD-10-CM | POA: Diagnosis not present

## 2024-01-14 DIAGNOSIS — Z79624 Long term (current) use of inhibitors of nucleotide synthesis: Secondary | ICD-10-CM | POA: Insufficient documentation

## 2024-01-14 DIAGNOSIS — D472 Monoclonal gammopathy: Secondary | ICD-10-CM | POA: Insufficient documentation

## 2024-01-14 DIAGNOSIS — K219 Gastro-esophageal reflux disease without esophagitis: Secondary | ICD-10-CM | POA: Insufficient documentation

## 2024-01-14 DIAGNOSIS — Z79631 Long term (current) use of antimetabolite agent: Secondary | ICD-10-CM | POA: Insufficient documentation

## 2024-01-14 DIAGNOSIS — N62 Hypertrophy of breast: Secondary | ICD-10-CM | POA: Insufficient documentation

## 2024-01-14 DIAGNOSIS — K317 Polyp of stomach and duodenum: Secondary | ICD-10-CM | POA: Diagnosis not present

## 2024-01-14 DIAGNOSIS — I119 Hypertensive heart disease without heart failure: Secondary | ICD-10-CM | POA: Diagnosis not present

## 2024-01-14 DIAGNOSIS — Z806 Family history of leukemia: Secondary | ICD-10-CM | POA: Diagnosis not present

## 2024-01-14 DIAGNOSIS — K921 Melena: Secondary | ICD-10-CM | POA: Insufficient documentation

## 2024-01-14 DIAGNOSIS — Z800 Family history of malignant neoplasm of digestive organs: Secondary | ICD-10-CM | POA: Insufficient documentation

## 2024-01-14 DIAGNOSIS — Z79899 Other long term (current) drug therapy: Secondary | ICD-10-CM | POA: Insufficient documentation

## 2024-01-14 DIAGNOSIS — C7A8 Other malignant neuroendocrine tumors: Secondary | ICD-10-CM

## 2024-01-14 DIAGNOSIS — D3502 Benign neoplasm of left adrenal gland: Secondary | ICD-10-CM | POA: Diagnosis not present

## 2024-01-14 DIAGNOSIS — Z801 Family history of malignant neoplasm of trachea, bronchus and lung: Secondary | ICD-10-CM | POA: Insufficient documentation

## 2024-01-14 DIAGNOSIS — Z7902 Long term (current) use of antithrombotics/antiplatelets: Secondary | ICD-10-CM | POA: Diagnosis not present

## 2024-01-14 DIAGNOSIS — D3501 Benign neoplasm of right adrenal gland: Secondary | ICD-10-CM | POA: Insufficient documentation

## 2024-01-14 LAB — CBC WITH DIFFERENTIAL (CANCER CENTER ONLY)
Abs Immature Granulocytes: 0.02 10*3/uL (ref 0.00–0.07)
Basophils Absolute: 0 10*3/uL (ref 0.0–0.1)
Basophils Relative: 1 %
Eosinophils Absolute: 0.2 10*3/uL (ref 0.0–0.5)
Eosinophils Relative: 3 %
HCT: 44.3 % (ref 39.0–52.0)
Hemoglobin: 15.3 g/dL (ref 13.0–17.0)
Immature Granulocytes: 0 %
Lymphocytes Relative: 16 %
Lymphs Abs: 0.8 10*3/uL (ref 0.7–4.0)
MCH: 32.8 pg (ref 26.0–34.0)
MCHC: 34.5 g/dL (ref 30.0–36.0)
MCV: 94.9 fL (ref 80.0–100.0)
Monocytes Absolute: 0.5 10*3/uL (ref 0.1–1.0)
Monocytes Relative: 10 %
Neutro Abs: 3.6 10*3/uL (ref 1.7–7.7)
Neutrophils Relative %: 70 %
Platelet Count: 235 10*3/uL (ref 150–400)
RBC: 4.67 MIL/uL (ref 4.22–5.81)
RDW: 13 % (ref 11.5–15.5)
WBC Count: 5.1 10*3/uL (ref 4.0–10.5)
nRBC: 0 % (ref 0.0–0.2)

## 2024-01-14 LAB — CMP (CANCER CENTER ONLY)
ALT: 32 U/L (ref 0–44)
AST: 30 U/L (ref 15–41)
Albumin: 3.7 g/dL (ref 3.5–5.0)
Alkaline Phosphatase: 57 U/L (ref 38–126)
Anion gap: 8 (ref 5–15)
BUN: 14 mg/dL (ref 8–23)
CO2: 26 mmol/L (ref 22–32)
Calcium: 8.9 mg/dL (ref 8.9–10.3)
Chloride: 99 mmol/L (ref 98–111)
Creatinine: 0.82 mg/dL (ref 0.61–1.24)
GFR, Estimated: >60 mL/min
Glucose, Bld: 100 mg/dL — ABNORMAL HIGH (ref 70–99)
Potassium: 3.9 mmol/L (ref 3.5–5.1)
Sodium: 133 mmol/L — ABNORMAL LOW (ref 135–145)
Total Bilirubin: 1.1 mg/dL (ref 0.0–1.2)
Total Protein: 7 g/dL (ref 6.5–8.1)

## 2024-01-14 LAB — VITAMIN B12: Vitamin B-12: 562 pg/mL (ref 180–914)

## 2024-01-15 LAB — KAPPA/LAMBDA LIGHT CHAINS
Kappa free light chain: 16.9 mg/L (ref 3.3–19.4)
Kappa, lambda light chain ratio: 1.17 (ref 0.26–1.65)
Lambda free light chains: 14.5 mg/L (ref 5.7–26.3)

## 2024-01-16 LAB — MULTIPLE MYELOMA PANEL, SERUM
Albumin SerPl Elph-Mcnc: 3.5 g/dL (ref 2.9–4.4)
Albumin/Glob SerPl: 1.2 (ref 0.7–1.7)
Alpha 1: 0.3 g/dL (ref 0.0–0.4)
Alpha2 Glob SerPl Elph-Mcnc: 0.9 g/dL (ref 0.4–1.0)
B-Globulin SerPl Elph-Mcnc: 0.9 g/dL (ref 0.7–1.3)
Gamma Glob SerPl Elph-Mcnc: 0.9 g/dL (ref 0.4–1.8)
Globulin, Total: 3 g/dL (ref 2.2–3.9)
IgA: 223 mg/dL (ref 61–437)
IgG (Immunoglobin G), Serum: 921 mg/dL (ref 603–1613)
IgM (Immunoglobulin M), Srm: 89 mg/dL (ref 20–172)
M Protein SerPl Elph-Mcnc: 0.4 g/dL — ABNORMAL HIGH
Total Protein ELP: 6.5 g/dL (ref 6.0–8.5)

## 2024-01-16 LAB — CHROMOGRANIN A: Chromogranin A (ng/mL): 987.2 ng/mL — ABNORMAL HIGH (ref 0.0–101.8)

## 2024-01-21 ENCOUNTER — Inpatient Hospital Stay (HOSPITAL_BASED_OUTPATIENT_CLINIC_OR_DEPARTMENT_OTHER): Payer: Medicare Other | Admitting: Oncology

## 2024-01-21 ENCOUNTER — Encounter: Payer: Self-pay | Admitting: Oncology

## 2024-01-21 ENCOUNTER — Other Ambulatory Visit: Payer: Self-pay

## 2024-01-21 ENCOUNTER — Other Ambulatory Visit: Payer: PRIVATE HEALTH INSURANCE

## 2024-01-21 VITALS — BP 125/91 | HR 97 | Temp 97.5°F | Resp 19 | Wt 192.5 lb

## 2024-01-21 DIAGNOSIS — R911 Solitary pulmonary nodule: Secondary | ICD-10-CM | POA: Diagnosis not present

## 2024-01-21 DIAGNOSIS — K219 Gastro-esophageal reflux disease without esophagitis: Secondary | ICD-10-CM

## 2024-01-21 DIAGNOSIS — C7A8 Other malignant neuroendocrine tumors: Secondary | ICD-10-CM

## 2024-01-21 DIAGNOSIS — D472 Monoclonal gammopathy: Secondary | ICD-10-CM

## 2024-01-21 DIAGNOSIS — C7A1 Malignant poorly differentiated neuroendocrine tumors: Secondary | ICD-10-CM | POA: Diagnosis not present

## 2024-01-21 NOTE — Assessment & Plan Note (Addendum)
 Gastric neuroendocrine carcinoma-well differentiated, Grade 2. Patient is on chronic PPI with initial high gastrin level.  Multiple gastric polyps.   Previously discussed with pathologist Dr. 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 Tumors arising in the setting of chronic PPI use appear to behave in a less malignant fashion than true-sporadic tumors. Recommend patient to continue follow up with GI 02/06/2023 Dotatate PET -no recurrence, slow growing lung nodule.  He has had CT chest done earlier this year. Will obtain CT abdomen pelvis w contrast  Labs reviewed and discussed with patient.  Chronically elevated chromogranin A, assumed to be due to PPI.

## 2024-01-21 NOTE — Assessment & Plan Note (Signed)
Stable M protein and light chain ratio.  Continue observation.  Repeat in 1 year.

## 2024-01-21 NOTE — Progress Notes (Signed)
 Hematology/Oncology Progress note [phone] [phone]     CHIEF COMPLAINTS/REASON FOR VISIT:  gastric neuroendocrine carcinoma, lung nodule.    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 Patient is on chronic PPI with initial high gastrin level.  Multiple gastric polyps.   Previously discussed with pathologist Dr. 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 Tumors arising in the setting of chronic PPI use appear to behave in a less malignant fashion than true-sporadic tumors. Recommend patient to continue follow up with GI 02/06/2023 Dotatate PET -no recurrence, slow growing lung nodule.  He has had CT chest done earlier this year. Will obtain CT abdomen pelvis w contrast  Labs reviewed and discussed with patient.  Chronically elevated chromogranin A, assumed to be due to PPI.   Lung nodule Slowing growing in size since 2022, He has an extensive history of smoking,  Recent biopsy showed organizing pneumonia, no malignancy.  Follow up with Dr. Aundria Rud  GERD (gastroesophageal reflux disease) Discussed with patient about lifestyle modification, avoiding caffeine, spicy food, alcohol, healthy weight loss etc. Recommend patient to follow up with GI  Monoclonal gammopathy Stable M protein and light chain ratio.  Continue observation.  Repeat in 1 year.   Orders Placed This Encounter  Procedures   CT ABDOMEN PELVIS W CONTRAST    Standing Status:   Future    Expected Date:   01/28/2024    Expiration Date:   01/20/2025    If indicated  for the ordered procedure, I authorize the administration of contrast media per Radiology protocol:   Yes    Does the patient have a contrast media/X-ray dye allergy?:   No    Preferred imaging location?:   [facility]    If indicated for the ordered procedure, I authorize the administration of oral contrast media per Radiology protocol:   Yes   CMP (Cancer Center only)    Standing Status:   Future    Expected Date:   01/20/2025    Expiration Date:   01/20/2025   CBC with Differential (Cancer Center Only)    Standing Status:   Future    Expected Date:   01/20/2025    Expiration Date:   01/20/2025   Multiple Myeloma Panel (SPEP&IFE w/QIG)    Standing Status:   Future    Expected Date:   01/20/2025    Expiration Date:   01/20/2025   Kappa/lambda light chains    Standing Status:   Future    Expected Date:   01/20/2025    Expiration Date:   01/20/2025   Vitamin B12    Standing Status:   Future    Expected Date:   01/20/2025    Expiration Date:   01/20/2025   Chromogranin A    Standing Status:   Future    Expected Date:   01/20/2025    Expiration Date:   01/20/2025   5 HIAA, quantitative, urine, 24 hour    Standing Status:   Future    Expected Date:   01/20/2025    Expiration Date:   01/20/2025   Follow-up 1 year All questions were answered.  The patient knows to call the clinic with any problems, questions or concerns.  Rickard Patience, MD, PhD [facility] Hematology Oncology 01/21/2024   HISTORY OF PRESENTING ILLNESS:   ARIES TOWNLEY is a  73 y.o.  male with PMH listed below was seen in consultation at the request of  Gracelyn Nurse, MD  for evaluation of gastric neuroendocrine carcinoma Patient was found to have positive occult blood in the stool.  Was referred to establish with gastroenterology. 05/05/2021, patient underwent EGD and colonoscopy by Dr. Mia Creek. EGD showed gastritis, erythematous mucosa in the gastric fundus, multiple fundic gland polyps, resected and  retrieved. Colonoscopy showed multiple colon polyps, resected and retrieved.  Diverticulosis.  Erythematous mucosa in the sigmoid colon.  CASE: [id number]  PATIENT: Orene Desanctis  Surgical Pathology Report  Specimen Submitted:  A. Stomach; cbx  B. Stomach polyp; cbx  C. Stomach, abnormal mucosa; cbx  D. Stomach polyp, inflammatory; cold snare  E. Colon polyp, rectosigmoid; cbx  F. Colon polyp, cecum; cbx  G. Colon polyp x2, transverse; cold snare (1) and cbx (1)  H. Colon, sigmoid, abnormal mucosa; cbx  I. Colon polyp, sigmoid; cbx   Clinical History: GERD, heme positive. Findings: Gastric polyps,  gastritis, colon polyps    DIAGNOSIS:  A.  STOMACH; COLD BIOPSY:  - ANTRAL-TYPE MUCOSA WITH REACTIVE FOVEOLAR HYPERPLASIA.  - OXYNTIC MUCOSA WITH PROTON PUMP INHIBITOR EFFECT.  - NEGATIVE FOR H. PYLORI, INTESTINAL METAPLASIA, DYSPLASIA, AND  MALIGNANCY.   B.  STOMACH POLYP; COLD BIOPSY:  - FUNDIC GLAND POLYP.  - NEGATIVE FOR DYSPLASIA AND MALIGNANCY.   C.  STOMACH, ABNORMAL MUCOSA; COLD BIOPSY:  - OXYNTIC-TYPE MUCOSA WITH REACTIVE FOVEOLAR HYPERPLASIA, HEMORRHAGE,  AND STROMAL CHANGES SUGGESTIVE OF HEALED MUCOSAL INJURY.  - NEGATIVE FOR INTESTINAL METAPLASIA, DYSPLASIA, AND MALIGNANCY.   D.  STOMACH POLYP, "INFLAMMATORY"; COLD SNARE:  - WELL-DIFFERENTIATED NEUROENDOCRINE TUMOR (NET G2), AT LEAST 5 MM IN  SIZE, SEE COMMENT.  - TUMOR INVOLVES THE MUSCULARIS MUCOSAE AND EXTENDS TO THE INKED DEEP  MARGIN.   E.  COLON POLYP, RECTOSIGMOID; COLD BIOPSY:  - HYPERPLASTIC POLYP.  - NEGATIVE FOR DYSPLASIA AND MALIGNANCY.   F.  COLON POLYP, CECUM; COLD BIOPSY:  - TUBULAR ADENOMA.  - NEGATIVE FOR HIGH-GRADE DYSPLASIA AND MALIGNANCY.   G.  COLON POLYP X 2; COLD SNARE (1) AND COLD BIOPSY (1):  - TUBULAR ADENOMA.  - NEGATIVE FOR HIGH-GRADE DYSPLASIA AND MALIGNANCY.  - ONLY 1 PIECE OF TISSUE FOUND IN THE CONTAINER.   H.  SIGMOID COLON, ABNORMAL MUCOSA; COLD BIOPSY:  - COLONIC  MUCOSA WITH INTACT CRYPT ARCHITECTURE.  - LYMPHOID AGGREGATES AND CONGESTION.  - NEGATIVE FOR DYSPLASIA AND MALIGNANCY.   I.  COLON POLYP, SIGMOID; COLD BIOPSY:  - HYPERPLASTIC POLYP.  - NEGATIVE FOR DYSPLASIA AND MALIGNANCY.   Patient denies any stomach discomfort today. He is on omeprazole 20 mg daily chronically.  He drinks alcohol on some days. Lupus, follows up with Dr. Gavin Potters.  He is on Imuran and Plaquenil Denies any unintentional weight loss, night sweats, diarrhea, facial flushing, fever.   Gastrin level was initially elevated on  with a level of 759, repeat after holding PPI for 2 weeks on 07/10/2021 showed a normalized level of 76.   06/20/2021 Dotatate PET scan showed no focal radial tracer activity within the stomach to localize well differentiated neuroendocrine tumor.  Diffuse physiological activity throughout the stomach.  No evidence of well-differentiated metastatic neuroendocrine tumor or adenopathy.  08/28/2021, patient underwent EUS by Dr.Mansouraty  FINAL MICROSCOPIC DIAGNOSIS:  A. STOMACH, BODY, POLYPECTOMY:  - Fundic gland polyp  - Negative for dysplasia  B. STOMACH, ANTRUM, BIOPSY:  - Gastric antral mucosa with mild chronic gastritis  - Warthin Starry stain is negative for Helicobacter pylori  C. STOMACH, INCISURA, BIOPSY:  - Gastric oxyntic mucosa with parietal cell hyperplasia as can be seen  in hypergastrinemic states such as PPI therapy.  - Negative for atrophy or intestinal metaplasia  D. STOMACH, GREATER CURVE, BIOPSY:  - Gastric oxyntic mucosa with parietal cell hyperplasia as can be seen  in hypergastrinemic states such as PPI therapy.  - Negative for atrophy or intestinal metaplasia  E. STOMACH, LESSER CURVE, BIOPSY:  - Gastric oxyntic mucosa with parietal cell hyperplasia as can be seen  in hypergastrinemic states such as PPI therapy.  - Negative for atrophy or intestinal metaplasia  F. STOMACH, CARDIA AND FUNDUS, BIOPSY:  - Gastric oxyntic  mucosa with parietal cell hyperplasia as can be seen  in hypergastrinemic states such as PPI therapy.  - Negative for atrophy or intestinal metaplasia  For gynecomastia work-up.  Patient underwent bilateral diagnostic mammogram on 06/12/2021.  Benign left greater than right gynecomastia.  Normal right axillary ultrasound. Patient underwent left breast vacuum-assisted core needle biopsy recommended by Dr. Lemar Livings, pathology showed benign mammary parenchyma with gynecomastia.  Negative for atypical proliferation breast disease.  Last EGD was on 08/17/2022    INTERVAL HISTORY FARREL GUIMOND is a 74 y.o. male who has above history reviewed by me today presents for follow up visit for gastric neuroendocrine carcinoma, MGUS.  He has no new complaints.  Continues to take PPI for acid reflux. Recently switched to Protonix from Prilosec. No improvement of GERD symptoms.  No cough, chest pain.   12/31/2023 RLL nodule biopsy via bronchoscopy.  Pathology showed limited benign pulmonary parenchyma with chronic inflammation and focal fibroblastic plugs suggestive of organizing pneumonia. No evidence of neoplasm or malignancy.   Review of Systems  Constitutional:  Negative for appetite change, chills, fatigue, fever and unexpected weight change.  HENT:   Negative for hearing loss and voice change.   Eyes:  Negative for eye problems and icterus.  Respiratory:  Negative for chest tightness, cough and shortness of breath.   Cardiovascular:  Negative for chest pain and leg swelling.  Gastrointestinal:  Negative for abdominal distention and abdominal pain.       Acid reflux  Endocrine: Negative for hot flashes.  Genitourinary:  Negative for difficulty urinating, dysuria and frequency.   Musculoskeletal:  Negative for arthralgias.  Skin:  Negative for itching and rash.  Neurological:  Negative for light-headedness and numbness.  Hematological:  Negative for adenopathy. Does not bruise/bleed easily.   Psychiatric/Behavioral:  Negative for confusion.     MEDICAL HISTORY:  Past Medical History:  Diagnosis Date   AAA (abdominal aortic aneurysm) (HCC)    Abdominal aortic aneurysm without rupture (HCC)    Adenomatous colon polyp    Allergic rhinitis    Anemia    Anogenital (venereal) warts    Anxiety    a.) on BZO PRN (alprazolam)   Aortic atherosclerosis (HCC)    Arthritis    Atherosclerosis of native artery of right lower extremity with intermittent claudication (HCC)    B12 deficiency    Back pain    Bilateral adrenal adenomas    BPH (benign prostatic hyperplasia)    Carpal tunnel syndrome    Celiac artery atherosclerosis    Coronary artery disease    a.)  MV 02/26/2005: no ischemia; b.) MV 11/24/2010: mild inferosep ischemia; c.) LHC/PCI 12/08/2010: 99% pLAD --> rotational athrectomy + 3.0 x 23 mm Xience DES x 1; d.) MV 02/15/2011: no ischemia; e.) MV 02/14/2018: no ischemia   Degenerative joint disease    DOE (dyspnea on exertion)    Duodenitis    Elevated liver enzymes 05/2023   Erectile dysfunction    a.) on PDE5i (tadalafil)   GERD (gastroesophageal reflux disease)    Hepatic steatosis    History of shingles    History of tobacco abuse    HPV in male    Hx of adenomatous colonic polyps    Hyperlipidemia    Hypertension    Hyponatremia    Iliac artery aneurysm (HCC)    Long term (current) use of aspirin    Long term current use of immunosuppressive drug    a.) Tx'd with azathioprine + hydroxychloroquine for SLE   Lumbar stenosis    MGUS (monoclonal gammopathy of unknown significance)    Multiple pulmonary nodules determined by computed tomography of lung    Neuroendocrine carcinoma of stomach (HCC) 2023   Nocturia    Nodule of lower lobe of right lung 06/2023   Paresthesia of left lower extremity    Peripheral vascular disease (HCC)    Right lower lobe pulmonary nodule 06/20/2021   a.) Dotatate PET 06/20/2021: posterior RLL 0.6 x 0.5 mm: b.) LDCT chest  07/27/2022: 0.8 x 0.6 mm; c.) Dotatate PET 02/04/2023: 0.7 x 0.7 mm; d.) CT chest 05/13/2023: 8 x 11 mm; e.) super D chest CT 07/17/2023: spiclated morphology 9 x 8 mm; f.) CT chest 12/09/2023: cavitary spiculated nodule 10 x 7 mm   Rotator cuff tendonitis    Shingles    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)    a.) Tx'd with azathioprine + hydroxychloroquine   Urinary frequency     SURGICAL HISTORY: Past Surgical History:  Procedure Laterality Date   BACK SURGERY     BIOPSY  08/28/2021   Procedure: BIOPSY;  Surgeon: Lemar Lofty., MD;  Location: [facility];  Service: Gastroenterology;;   BRONCHIAL BIOPSY  07/18/2023   Procedure: BRONCHIAL BIOPSIES;  Surgeon: Raechel Chute, MD;  Location: [facility];  Service: Pulmonary;;   BRONCHIAL BIOPSY Right    BRONCHIAL BIOPSY Right 12/31/2023   BRONCHIAL NEEDLE ASPIRATION BIOPSY  07/18/2023   Procedure: BRONCHIAL NEEDLE ASPIRATION BIOPSIES;  Surgeon: Raechel Chute, MD;  Location: [facility];  Service: Pulmonary;;   BRONCHIAL WASHINGS  07/18/2023   Procedure: BRONCHIAL WASHINGS;  Surgeon: Raechel Chute, MD;  Location: [facility];  Service: Pulmonary;;   BROW LIFT Bilateral 01/27/2021   Procedure: BLEPHAROPLASTY UPPER EYELID; W/EXCESS SKIN BROW PTOSIS REPAIR AND BLEPHAROPTOSIS REPAIR; RESECT EX BILATERAL;  Surgeon: Imagene Riches, MD;  Location[facility];  Service: Ophthalmology;  Laterality: Bilateral;   COLONOSCOPY     COLONOSCOPY WITH PROPOFOL N/A 09/30/2019   Procedure: COLONOSCOPY WITH PROPOFOL;  Surgeon: Earline Mayotte, MD;  Location: [facility] END[facility];  Service: Endoscopy;  Laterality: N/A;   COLONOSCOPY WITH PROPOFOL N/A 05/05/2021   Procedure: COLONOSCOPY WITH PROPOFOL;  Surgeon: Regis Bill, MD;  Location: [facility];  Service: Endoscopy;  Laterality: N/A;   CORONARY ANGIOPLASTY WITH STENT PLACEMENT Left 12/08/2010   Procedure: CORONARY ANGIOPLASTY WITH STENT PLACEMENT (pLAD);  Procedure: [facility]   ESOPHAGOGASTRODUODENOSCOPY     ESOPHAGOGASTRODUODENOSCOPY (EGD) WITH PROPOFOL N/A 05/05/2021   Procedure: ESOPHAGOGASTRODUODENOSCOPY (EGD) WITH PROPOFOL;  Surgeon: Regis Bill, MD;  Location: [facility]  ENDOSCOPY;  Service: Endoscopy;  Laterality: N/A;   ESOPHAGOGASTRODUODENOSCOPY (EGD) WITH PROPOFOL N/A 08/28/2021   Procedure: ESOPHAGOGASTRODUODENOSCOPY (EGD) WITH PROPOFOL;  Surgeon: Meridee Score Netty Starring., MD;  Location: [facility];  Service: Gastroenterology;  Laterality: N/A;   ESOPHAGOGASTRODUODENOSCOPY (EGD) WITH PROPOFOL N/A 08/17/2022   Procedure: ESOPHAGOGASTRODUODENOSCOPY (EGD) WITH PROPOFOL;  Surgeon: Regis Bill, MD;  Location: [facility] END[facility];  Service: Endoscopy;  Laterality: N/A;   EUS N/A 08/28/2021   Procedure: UPPER ENDOSCOPIC ULTRASOUND (EUS) RADIAL;  Surgeon: Lemar Lofty., MD;  Location: [facility];  Service: Gastroenterology;  Laterality: N/A;   LOWER EXTREMITY ANGIOGRAPHY Left 01/10/2024   Procedure: Lower Extremity Angiography;  Surgeon: Renford Dills, MD;  Location: [facility];  Service: Cardiovascular;  Laterality: Left;   LUMBAR LAMINECTOMY/DECOMPRESSION MICRODISCECTOMY Left 02/03/2015   Procedure: LUMBAR LAMINECTOMY/DECOMPRESSION MICRODISCECTOMY LEFT LUMBAR FOUR-FIVE WITH REMOVAL OF SYNOVIAL CYST;  Surgeon: Tressie Stalker, MD;  Location: [facility];  Service: Neurosurgery;  Laterality: Left;   NASAL SEPTOPLASTY W/ TURBINOPLASTY Bilateral 10/23/2019   Procedure: NASAL SEPTOPLASTY WITH SUBMUCOUS RESECTION OF TURBINATE;  Surgeon: Linus Salmons, MD;  Location: [facility];  Service: ENT;  Laterality: Bilateral;   PELVIC ANGIOGRAPHY N/A 05/02/2018   Procedure: PELVIC ANGIOGRAPHY;  Surgeon: Renford Dills, MD;  Location: [facility];  Service: Cardiovascular;  Laterality: N/A;   POLYPECTOMY  08/28/2021   Procedure: POLYPECTOMY;  Surgeon: Meridee Score Netty Starring., MD;  Location: [facility];   Service: Gastroenterology;;   SHOULDER ARTHROSCOPY WITH SUBACROMIAL DECOMPRESSION AND BICEP TENDON REPAIR Right 12/04/2018   Procedure: SHOULDER ARTHROSCOPY WITH DEBRIDEMENT, DECOMPRESSION, BICEP TENODESIS;  Surgeon: Christena Flake, MD;  Location: [facility];  Service: Orthopedics;  Laterality: Right;   SUBMUCOSAL INJECTION  08/28/2021   Procedure: SUBMUCOSAL INJECTION;  Surgeon: Meridee Score Netty Starring., MD;  Location: [facility];  Service: Gastroenterology;;    SOCIAL HISTORY: Social History   Socioeconomic History   Marital status: Married    Spouse name: Not on file   Number of children: Not on file   Years of education: Not on file   Highest education level: Not on file  Occupational History   Not on file  Tobacco Use   Smoking status: Former    Current packs/day: 0.00    Average packs/day: 1.5 packs/day for 30.0 years (45.0 ttl pk-yrs)    Types: Cigarettes    Start date: 04/01/1991    Quit date: 03/31/2021    Years since quitting: 2.8   Smokeless tobacco: Never  Vaping Use   Vaping status: Never Used  Substance and Sexual Activity   Alcohol use: Yes    Alcohol/week: 14.0 standard drinks of alcohol    Types: 14 Standard drinks or equivalent per week    Comment: cocktails about 2 daily   Drug use: No   Sexual activity: Yes  Other Topics Concern   Not on file  Social History Narrative   Not on file   Social Drivers of Health   Financial Resource Strain: Not on file  Food Insecurity: Not on file  Transportation Needs: Not on file  Physical Activity: Not on file  Stress: Not on file  Social Connections: Not on file  Intimate Partner Violence: Not on file    FAMILY HISTORY: Family History  Problem Relation Age of Onset   Leukemia Mother        dx 82s   Hyperlipidemia Father    Hypertension Father    Diabetes Father    Colon cancer Father  dx 14s   Hearing loss Father    Vascular Disease Father    Lung cancer Brother        d. 12    ALLERGIES:  is  allergic to fosamax [alendronate sodium] and percocet [oxycodone-acetaminophen].  MEDICATIONS:  Current Outpatient Medications  Medication Sig Dispense Refill   ALPRAZolam (XANAX) 0.5 MG tablet Take 0.5 mg by mouth 3 (three) times daily as needed for anxiety.      amLODipine (NORVASC) 2.5 MG tablet Take 5 mg by mouth daily.     azaTHIOprine (IMURAN) 50 MG tablet Take 125 mg by mouth daily.      clopidogrel (PLAVIX) 75 MG tablet Take 1 tablet (75 mg total) by mouth daily. 30 tablet 11   cyanocobalamin (,VITAMIN B-12,) 1000 MCG/ML injection Inject 1,000 mcg into the muscle every 14 (fourteen) days.      finasteride (PROSCAR) 5 MG tablet Take 1 tablet (5 mg total) by mouth daily. 90 tablet 3   hydrochlorothiazide (HYDRODIURIL) 12.5 MG tablet Take 12.5 mg by mouth daily.     hydroxychloroquine (PLAQUENIL) 200 MG tablet Take 200 mg by mouth 2 (two) times daily.     ibuprofen (ADVIL,MOTRIN) 200 MG tablet Take 400 mg by mouth every 8 (eight) hours as needed for headache or moderate pain.     metoprolol succinate (TOPROL-XL) 50 MG 24 hr tablet Take 50 mg by mouth at bedtime. Take with or immediately following a meal.     nicotine (NICODERM CQ - DOSED IN MG/24 HOURS) 21 mg/24hr patch Place 21 mg onto the skin daily.     nitroGLYCERIN (NITROLINGUAL) 0.4 MG/SPRAY spray Place 1 spray under the tongue every 5 (five) minutes x 3 doses as needed for chest pain.     pantoprazole (PROTONIX) 20 MG tablet Take 1 tablet (20 mg total) by mouth daily. 30 tablet 11   pravastatin (PRAVACHOL) 40 MG tablet Take 40 mg by mouth at bedtime.     Probiotic Product (PROBIOTIC DAILY PO) Take 1 capsule by mouth daily.     tamsulosin (FLOMAX) 0.4 MG CAPS capsule Take 1 capsule (0.4 mg total) by mouth daily after breakfast. 30 capsule 11   triamcinolone (NASACORT) 55 MCG/ACT AERO nasal inhaler Place 1 spray into the nose daily as needed (allergies).     aspirin EC 81 MG tablet Take 81 mg by mouth daily. (Patient not taking:  Reported on 01/21/2024)     cetirizine (ZYRTEC) 10 MG tablet Take 10 mg by mouth daily. (Patient not taking: Reported on 01/21/2024)     ELDERBERRY PO Take 2 tablets by mouth daily. (Patient not taking: Reported on 12/25/2023)     spironolactone (ALDACTONE) 25 MG tablet Take 25 mg by mouth daily. (Patient not taking: Reported on 12/25/2023)     tadalafil (CIALIS) 20 MG tablet Take 1 tablet (20 mg total) by mouth daily as needed for erectile dysfunction. (Patient not taking: Reported on 01/21/2024) 30 tablet 6   No current facility-administered medications for this visit.     PHYSICAL EXAMINATION: ECOG PERFORMANCE STATUS: 0 - Asymptomatic Vitals:   01/21/24 1024  BP: (!) 125/91  Pulse: 97  Resp: 19  Temp: (!) 97.5 F (36.4 C)  SpO2: 99%   Filed Weights   01/21/24 1024  Weight: 192 lb 8 oz (87.3 kg)    Physical Exam Constitutional:      General: He is not in acute distress. HENT:     Head: Normocephalic and atraumatic.  Eyes:  General: No scleral icterus. Cardiovascular:     Rate and Rhythm: Normal rate and regular rhythm.     Heart sounds: Normal heart sounds.  Pulmonary:     Effort: Pulmonary effort is normal. No respiratory distress.     Breath sounds: No wheezing.  Abdominal:     General: Bowel sounds are normal. There is no distension.     Palpations: Abdomen is soft.  Musculoskeletal:        General: No deformity. Normal range of motion.     Cervical back: Normal range of motion and neck supple.  Skin:    General: Skin is warm and dry.     Findings: No erythema or rash.  Neurological:     Mental Status: He is alert and oriented to person, place, and time. Mental status is at baseline.     Cranial Nerves: No cranial nerve deficit.     Coordination: Coordination normal.  Psychiatric:        Mood and Affect: Mood normal.      LABORATORY DATA:  I have reviewed the data as listed    Latest Ref Rng & Units 01/14/2024   10:23 AM 04/22/2023    3:19 PM 01/14/2023     9:38 AM  CBC  WBC 4.0 - 10.5 K/uL 5.1  6.1  4.9   Hemoglobin 13.0 - 17.0 g/dL 95.6  21.3  08.6   Hematocrit 39.0 - 52.0 % 44.3  46.2  45.3   Platelets 150 - 400 K/uL 235  251  232       Latest Ref Rng & Units 01/14/2024   10:23 AM 01/10/2024    8:12 AM 04/22/2023    3:19 PM  CMP  Glucose 70 - 99 mg/dL 578   469   BUN 8 - 23 mg/dL 14  14  18    Creatinine 0.61 - 1.24 mg/dL 6.29  5.28  4.13   Sodium 135 - 145 mmol/L 133   135   Potassium 3.5 - 5.1 mmol/L 3.9   3.5   Chloride 98 - 111 mmol/L 99   98   CO2 22 - 32 mmol/L 26   24   Calcium 8.9 - 10.3 mg/dL 8.9   9.1   Total Protein 6.5 - 8.1 g/dL 7.0     Total Bilirubin 0.0 - 1.2 mg/dL 1.1     Alkaline Phos 38 - 126 U/L 57     AST 15 - 41 U/L 30     ALT 0 - 44 U/L 32       RADIOGRAPHIC STUDIES: I have personally reviewed the radiological images as listed and agreed with the findings in the report. CT SUPER D CHEST WO CONTRAST Result Date: 01/15/2024 CLINICAL DATA:  Gastric neuroendocrine tumor and lupus on immunosuppression. Right lower lobe nodule. EXAM: CT CHEST WITHOUT CONTRAST TECHNIQUE: Multidetector CT imaging of the chest was performed using thin slice collimation for electromagnetic bronchoscopy planning purposes, without intravenous contrast. RADIATION DOSE REDUCTION: This exam was performed according to the departmental dose-optimization program which includes automated exposure control, adjustment of the mA and/or kV according to patient size and/or use of iterative reconstruction technique. COMPARISON:  12/09/2023, 07/17/2023, 05/13/2023, 07/27/2022. PET 02/04/2023. FINDINGS: Cardiovascular: Atherosclerotic calcification of the aorta, aortic valve and coronary arteries. Heart is enlarged. Lipomatous hypertrophy of the interatrial septum. No pericardial effusion. Mediastinum/Nodes: Scattered small mediastinal lymph nodes, as before. No pathologically enlarged mediastinal or axillary lymph nodes. Hilar regions are difficult to  definitively evaluate without IV  contrast. Esophagus is grossly unremarkable. Lungs/Pleura: Mild centrilobular emphysema. Centrally lucent nodule in the posterior right lower lobe measures 9 x 12 mm (4/91), enlarged from 7 mm on 07/27/2022. Additional scattered small pulmonary nodules measure 6 mm or less in size, unchanged from 07/27/2022. No pleural fluid. Airway is unremarkable. Upper Abdomen: Probable small right hepatic lobe cyst. Liver is slightly heterogeneous. 1.9 cm right adrenal nodule measures -23 Hounsfield units. Left adrenal nodule measures 1.6 cm and -3 Hounsfield units. Partially calcified isodense lesion in the upper pole right kidney measures 10 mm, present dating back to 06/20/2021 possibly minimally larger. 1.3 cm slightly exophytic low-attenuation lesion off the interpolar left kidney, present on recent prior examinations but new from 06/20/2021. Visualized portions of the liver, gallbladder, adrenal glands, kidneys, spleen, pancreas, stomach and bowel are otherwise grossly unremarkable. No upper abdominal adenopathy. Musculoskeletal: Degenerative changes in the spine. No worrisome lytic or sclerotic lesions. Calcifications in the deep posterior subcutaneous fat. IMPRESSION: 1. Dominant posterior right lower lobe nodule, enlarged from 07/27/2022 and worrisome for adenocarcinoma. 2. Additional pulmonary nodules measure 6 mm or less in size and are stable from 07/27/2022. 3. Slightly exophytic lesion off left kidney cannot be characterized as a simple cyst and is new from 06/20/2021. Further evaluation with pre and post contrast MRI should be considered. Pre and post contrast CT could alternatively be performed, but would likely be of decreased accuracy given lesion size. 4. Liver appears mildly steatotic. 5. Bilateral adrenal adenomas. 6. Aortic atherosclerosis (ICD10-I70.0). Coronary artery calcification. 7.  Emphysema (ICD10-J43.9). Electronically Signed   By: Leanna Battles M.D.   On:  01/15/2024 14:03   PERIPHERAL VASCULAR CATHETERIZATION Result Date: 01/10/2024 See surgical note for result.  DG Chest Port 1 View Result Date: 12/31/2023 CLINICAL DATA:  Status post bronchoscopy. EXAM: PORTABLE CHEST 1 VIEW COMPARISON:  Radiograph 07/18/2023, CT 12/30/2023 FINDINGS: No pneumothorax post bronchoscopy. Stable heart size and mediastinal contours. No pleural effusion or focal airspace disease. Right lower lobe nodule is not well seen by radiograph. IMPRESSION: No pneumothorax post bronchoscopy. Right lower lobe nodule is not well seen. Electronically Signed   By: Narda Rutherford M.D.   On: 12/31/2023 17:16   DG C-Arm 1-60 Min-No Report Result Date: 12/31/2023 Fluoroscopy was utilized by the requesting physician.  No radiographic interpretation.   VAS [place] LOWER EXTREMITY ARTERIAL DUPLEX Result Date: 12/23/2023 LOWER EXTREMITY ARTERIAL DUPLEX STUDY Patient Name:  TONG PIECZYNSKI  Date of Exam:   12/23/2023 Medical Rec #: [id number]       Accession #:    [id number] Date of Birth: [birth year]       Patient Gender: M Patient Age:   80 years Exam Location:  [facility] Procedure:      VAS [place] LOWER EXTREMITY ARTERIAL DUPLEX Referring Phys: Levora Dredge --------------------------------------------------------------------------------  Indications: Claudication, and peripheral artery disease. High Risk Factors: Hyperlipidemia, past history of smoking.  Vascular Interventions: Bilat CIA stents. Current ABI:            Right: 1.13, Left 0.80 Performing Technologist: Hardie Lora RVT  Examination Guidelines: A complete evaluation includes B-mode imaging, spectral Doppler, color Doppler, and power Doppler as needed of all accessible portions of each vessel. Bilateral testing is considered an integral part of a complete examination. Limited examinations for reoccurring indications may be performed as noted.   +----------+--------+-----+---------------+----------+-----------------+ LEFT       PSV cm/sRatioStenosis       Waveform  Comments          +----------+--------+-----+---------------+----------+-----------------+  CIA Prox  92                          biphasic                    +----------+--------+-----+---------------+----------+-----------------+ CIA Distal94                          triphasic                   +----------+--------+-----+---------------+----------+-----------------+ EIA Distal110                         biphasic                    +----------+--------+-----+---------------+----------+-----------------+ CFA Distal59                          biphasic                    +----------+--------+-----+---------------+----------+-----------------+ DFA       46                          biphasic                    +----------+--------+-----+---------------+----------+-----------------+ SFA Prox  80                          biphasic                    +----------+--------+-----+---------------+----------+-----------------+ SFA Mid   58                          biphasic                    +----------+--------+-----+---------------+----------+-----------------+ SFA Distal56                          biphasic                    +----------+--------+-----+---------------+----------+-----------------+ POP Prox  38                          monophasic                  +----------+--------+-----+---------------+----------+-----------------+ POP Mid   552          75-99% stenosismonophasicHeavily calcified +----------+--------+-----+---------------+----------+-----------------+ POP Distal31                          monophasic                  +----------+--------+-----+---------------+----------+-----------------+ ATA Distal20                          monophasic                  +----------+--------+-----+---------------+----------+-----------------+ PTA Distal36                          monophasic                   +----------+--------+-----+---------------+----------+-----------------+  A focal velocity elevation of 552 cm/s was obtained at Mid popliteal with post stenotic turbulence with a VR of 14.5. Findings are characteristic of 75-99% stenosis.   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 Patent stent with no evidence of stenosis in the iliac artery artery.  See table(s) above for measurements and observations. Electronically signed by Levora Dredge MD on 12/23/2023 at 4:06:49 PM.    Final    VAS [place] ABI WITH/WO TBI Result Date: 12/23/2023  LOWER EXTREMITY DOPPLER STUDY Patient Name:  DELMONT PROSCH  Date of Exam:   12/23/2023 Medical Rec #: [id number]       Accession #:    [id number] Date of Birth: 15-Apr-1951       Patient Gender: M Patient Age:   39 years Exam Location:  [facility] Procedure:      VAS [place] ABI WITH/WO TBI Referring Phys: [facility] --------------------------------------------------------------------------------  Indications: Claudication, and peripheral artery disease. High Risk Factors: Hyperlipidemia, past history of smoking.  Vascular Interventions: Bilat CIA stents. Performing Technologist: Hardie Lora RVT  Examination Guidelines: A complete evaluation includes at minimum, Doppler waveform signals and systolic blood pressure reading at the level of bilateral brachial, anterior tibial, and posterior tibial arteries, when vessel segments are accessible. Bilateral testing is considered an integral part of a complete examination. Photoelectric Plethysmograph (PPG) waveforms and toe systolic pressure readings are included as required and additional duplex testing as needed. Limited examinations for reoccurring indications may be performed as noted.  ABI Findings: +---------+------------------+-----+---------+--------+ Right    Rt Pressure (mmHg)IndexWaveform Comment  +---------+------------------+-----+---------+--------+ Brachial 151                                       +---------+------------------+-----+---------+--------+ PTA      180               1.19 triphasic         +---------+------------------+-----+---------+--------+ DP       170               1.13 triphasic         +---------+------------------+-----+---------+--------+ Great Toe93                0.62                   +---------+------------------+-----+---------+--------+ +---------+------------------+-----+----------+-------+ Left     Lt Pressure (mmHg)IndexWaveform  Comment +---------+------------------+-----+----------+-------+ Brachial 147                                      +---------+------------------+-----+----------+-------+ PTA      121               0.80 monophasic        +---------+------------------+-----+----------+-------+ DP       108               0.72 monophasic        +---------+------------------+-----+----------+-------+ Great Toe79                0.52                   +---------+------------------+-----+----------+-------+ +-------+-----------+-----------+------------+------------+ ABI/TBIToday's ABIToday's TBIPrevious ABIPrevious TBI +-------+-----------+-----------+------------+------------+ Right  1.19       0.62       1.10  0.78         +-------+-----------+-----------+------------+------------+ Left   0.80       0.52       1.07        0.86         +-------+-----------+-----------+------------+------------+  Right ABIs appear essentially unchanged compared to prior study on 12/17/2022. Left ABIs appear decreased compared to prior study on 12/17/2022.  Summary: Right: Resting right ankle-brachial index is within normal range. The right toe-brachial index is abnormal. Left: Resting left ankle-brachial index indicates mild left lower extremity arterial disease. The left toe-brachial index is abnormal. *See table(s) above for measurements and observations.  Electronically signed by Levora Dredge MD on 12/23/2023 at 4:06:35 PM.    Final

## 2024-01-21 NOTE — Assessment & Plan Note (Addendum)
 Slowing growing in size since 2022, He has an extensive history of smoking,  Recent biopsy showed organizing pneumonia, no malignancy.  Follow up with Dr. Aundria Rud

## 2024-01-21 NOTE — Assessment & Plan Note (Signed)
 Discussed with patient about lifestyle modification, avoiding caffeine, spicy food, alcohol, healthy weight loss etc. Recommend patient to follow up with GI

## 2024-01-22 ENCOUNTER — Encounter: Payer: Self-pay | Admitting: Student in an Organized Health Care Education/Training Program

## 2024-01-22 ENCOUNTER — Ambulatory Visit (INDEPENDENT_AMBULATORY_CARE_PROVIDER_SITE_OTHER): Payer: PRIVATE HEALTH INSURANCE | Admitting: Student in an Organized Health Care Education/Training Program

## 2024-01-22 VITALS — BP 110/80 | HR 87 | Temp 98.4°F | Ht 72.0 in | Wt 193.2 lb

## 2024-01-22 DIAGNOSIS — R911 Solitary pulmonary nodule: Secondary | ICD-10-CM | POA: Diagnosis not present

## 2024-01-22 DIAGNOSIS — M329 Systemic lupus erythematosus, unspecified: Secondary | ICD-10-CM | POA: Diagnosis not present

## 2024-01-22 DIAGNOSIS — Z87891 Personal history of nicotine dependence: Secondary | ICD-10-CM

## 2024-01-22 DIAGNOSIS — C7A8 Other malignant neuroendocrine tumors: Secondary | ICD-10-CM | POA: Diagnosis not present

## 2024-01-24 NOTE — Progress Notes (Signed)
 Synopsis: Referred in for pulmonary nodule by Gracelyn Nurse, MD  Assessment & Plan:   #Lung nodule #Gastric Neuroendocrine Carcinoma #SLE on immune suppression (azathioprine, hydroxychloroquine)  Patient is presenting for follow up of pulmonary nodule now s/p two robotic assisted navigational bronchoscopies, with second biopsy showing signs of organizing pneumonia in the nodule but no malignancy. Cultures (bacterial, fungal, AFB) from the first biopsy were negative.  My differential for the nodule remains unchanged. He does have a history of SLE and is maintained on immune suppression, and this focal spot of organizing pneumonia could be a result of his underlying auto-immune disorder or secondary to an infectious process that we are not able to capture. A metastatic gastric neuroendocrine tumor or other separate primary lung malignancy is lower on the differential, though this could present as organizing pneumonia as well. A sampling error and false negative, while low (given two negative biopsies with cone beam CT for correction of divergence) remains possible.  I discussed these findings with Sergio Callahan in clinic today, and we explored next steps. I offered a referral to IR for a CT guided biopsy. I then discussed this case with Dr. Fredia Sorrow from IR who felt that CT guided biopsy would similarly have a possibility of sampling error given that the nodule is in a challenging position and will likely move with respiration while biopsy is attempted.  Given this, we will obtain a PET/CT to evaluate for PET avidity. I am inclined to monitor the nodule radiographically if it is not FDG avid. If it shows intense FDG avidity, will consider referral to thoracic surgery for consideration of wedge biopsy and resection.   - CT GUIDED NEEDLE PLACEMENT > discussed with Dr. Fredia Sorrow and deferred as it is technically challenging - NM PET Image Initial (PI) Skull Base To Thigh (F-18 FDG);  Future   Return in about 2 months (around 03/23/2024).  I spent 30 minutes caring for this patient today, including preparing to see the patient, obtaining a medical history , reviewing a separately obtained history, performing a medically appropriate examination and/or evaluation, counseling and educating the patient/family/caregiver, ordering medications, tests, or procedures, referring and communicating with other health care professionals (not separately reported), documenting clinical information in the electronic health record, and independently interpreting results (not separately reported/billed) and communicating results to the patient/family/caregiver  Raechel Chute, MD [facility]  End of visit medications:  No orders of the defined types were placed in this encounter.    Current Outpatient Medications:    ALPRAZolam (XANAX) 0.5 MG tablet, Take 0.5 mg by mouth 3 (three) times daily as needed for anxiety. , Disp: , Rfl:    amLODipine (NORVASC) 2.5 MG tablet, Take 5 mg by mouth daily., Disp: , Rfl:    aspirin EC 81 MG tablet, Take 81 mg by mouth daily., Disp: , Rfl:    azaTHIOprine (IMURAN) 50 MG tablet, Take 125 mg by mouth daily. , Disp: , Rfl:    cetirizine (ZYRTEC) 10 MG tablet, Take 10 mg by mouth daily., Disp: , Rfl:    clopidogrel (PLAVIX) 75 MG tablet, Take 1 tablet (75 mg total) by mouth daily., Disp: 30 tablet, Rfl: 11   cyanocobalamin (,VITAMIN B-12,) 1000 MCG/ML injection, Inject 1,000 mcg into the muscle every 14 (fourteen) days. , Disp: , Rfl:    ELDERBERRY PO, Take 2 tablets by mouth daily., Disp: , Rfl:    finasteride (PROSCAR) 5 MG tablet, Take 1 tablet (5 mg total) by mouth daily.,  Disp: 90 tablet, Rfl: 3   hydrochlorothiazide (HYDRODIURIL) 12.5 MG tablet, Take 12.5 mg by mouth daily., Disp: , Rfl:    hydroxychloroquine (PLAQUENIL) 200 MG tablet, Take 200 mg by mouth 2 (two) times daily., Disp: , Rfl:    ibuprofen (ADVIL,MOTRIN) 200 MG tablet,  Take 400 mg by mouth every 8 (eight) hours as needed for headache or moderate pain., Disp: , Rfl:    metoprolol succinate (TOPROL-XL) 50 MG 24 hr tablet, Take 50 mg by mouth at bedtime. Take with or immediately following a meal., Disp: , Rfl:    nicotine (NICODERM CQ - DOSED IN MG/24 HOURS) 21 mg/24hr patch, Place 21 mg onto the skin daily., Disp: , Rfl:    nitroGLYCERIN (NITROLINGUAL) 0.4 MG/SPRAY spray, Place 1 spray under the tongue every 5 (five) minutes x 3 doses as needed for chest pain., Disp: , Rfl:    pantoprazole (PROTONIX) 20 MG tablet, Take 1 tablet (20 mg total) by mouth daily., Disp: 30 tablet, Rfl: 11   pravastatin (PRAVACHOL) 40 MG tablet, Take 40 mg by mouth at bedtime., Disp: , Rfl:    Probiotic Product (PROBIOTIC DAILY PO), Take 1 capsule by mouth daily., Disp: , Rfl:    spironolactone (ALDACTONE) 25 MG tablet, Take 25 mg by mouth daily., Disp: , Rfl:    tadalafil (CIALIS) 20 MG tablet, Take 1 tablet (20 mg total) by mouth daily as needed for erectile dysfunction., Disp: 30 tablet, Rfl: 6   tamsulosin (FLOMAX) 0.4 MG CAPS capsule, Take 1 capsule (0.4 mg total) by mouth daily after breakfast., Disp: 30 capsule, Rfl: 11   triamcinolone (NASACORT) 55 MCG/ACT AERO nasal inhaler, Place 1 spray into the nose daily as needed (allergies)., Disp: , Rfl:    Subjective:   PATIENT ID: Sergio Callahan GENDER: male DOB: [birth year], MRN: [id number]  Chief Complaint  Patient presents with   Follow-up    No problems since bronch    HPI  Patient is a pleasant 73 year old male presenting to clinic for follow up of a RLL pulmonary nodule.   Patient was in his usual state of health and was noted to have a right lower lobe nodule that was grown on interval CT scan performed July 1st, 2024.  He was seen by oncology with Dr. Cathie Hoops where he is following for gastric neuroendocrine cancer. 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   Patient  established care with [place] and underwent robotic assisted navigational bronchoscopy to the RLL nodule on 07/2023 with cytology returning negative. We then monitored the nodule with imaging, which showed slight increase in size. He again underwent robotic assisted navigational bronchoscopy to the RLL nodule on 12/31/2023 with cytology not showing any malignancy and pathology showing signs of organizing pneumonia.  Patient presents for follow up and to discuss results. He has no respiratory symptoms. He denies cough, shortness of breath, hemoptysis, night sweats, or weight loss.    Patient reports a history of smoking, he quit over a year ago, started smoking at the age of 81.  He probably has 30 to 40 pack years of smoking history.  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  He now works part-time jobs but denies any exposures.  Ancillary information including prior medications, full medical/surgical/family/social histories, and PFTs (when available) are listed below and have been reviewed.   Review of Systems  Constitutional:  Negative for chills, fever  and weight loss.  Respiratory:  Negative for cough, hemoptysis, sputum production, shortness of breath and wheezing.   Cardiovascular:  Negative for chest pain.  Skin:  Negative for rash.     Objective:   Vitals:   01/22/24 0956  BP: 110/80  Pulse: 87  Temp: 98.4 F (36.9 C)  TempSrc: Oral  SpO2: 99%  Weight: 193 lb 3.2 oz (87.6 kg)  Height: 6' (1.829 m)   99% on RA  BMI Readings from Last 3 Encounters:  01/22/24 26.20 kg/m  01/21/24 26.11 kg/m  01/10/24 26.35 kg/m   Wt Readings from Last 3 Encounters:  01/22/24 193 lb 3.2 oz (87.6 kg)  01/21/24 192 lb 8 oz (87.3 kg)  01/10/24 194 lb 4.8 oz (88.1 kg)    Physical Exam Constitutional:      Appearance: Normal appearance. He is not ill-appearing.  HENT:     Mouth/Throat:     Mouth: Mucous membranes are moist.   Cardiovascular:     Rate and Rhythm: Normal rate and regular rhythm.     Pulses: Normal pulses.     Heart sounds: Normal heart sounds.  Pulmonary:     Effort: Pulmonary effort is normal.     Breath sounds: Normal breath sounds.  Abdominal:     Palpations: Abdomen is soft.  Neurological:     General: No focal deficit present.     Mental Status: He is alert and oriented to person, place, and time. Mental status is at baseline.      Ancillary Information    Past Medical History:  Diagnosis Date   AAA (abdominal aortic aneurysm) (HCC)    Abdominal aortic aneurysm without rupture (HCC)    Adenomatous colon polyp    Allergic rhinitis    Anemia    Anogenital (venereal) warts    Anxiety    a.) on BZO PRN (alprazolam)   Aortic atherosclerosis (HCC)    Arthritis    Atherosclerosis of native artery of right lower extremity with intermittent claudication (HCC)    B12 deficiency    Back pain    Bilateral adrenal adenomas    BPH (benign prostatic hyperplasia)    Carpal tunnel syndrome    Celiac artery atherosclerosis    Coronary artery disease    a.) MV 02/26/2005: no ischemia; b.) MV 11/24/2010: mild inferosep ischemia; c.) LHC/PCI 12/08/2010: 99% pLAD --> rotational athrectomy + 3.0 x 23 mm Xience DES x 1; d.) MV 02/15/2011: no ischemia; e.) MV 02/14/2018: no ischemia   Degenerative joint disease    DOE (dyspnea on exertion)    Duodenitis    Elevated liver enzymes 05/2023   Erectile dysfunction    a.) on PDE5i (tadalafil)   GERD (gastroesophageal reflux disease)    Hepatic steatosis    History of shingles    History of tobacco abuse    HPV in male    Hx of adenomatous colonic polyps    Hyperlipidemia    Hypertension    Hyponatremia    Iliac artery aneurysm (HCC)    Long term (current) use of aspirin    Long term current use of immunosuppressive drug    a.) Tx'd with azathioprine + hydroxychloroquine for SLE   Lumbar stenosis    MGUS (monoclonal gammopathy of unknown  significance)    Multiple pulmonary nodules determined by computed tomography of lung    Neuroendocrine carcinoma of stomach (HCC) 2023   Nocturia    Nodule of lower lobe of right lung 06/2023  Paresthesia of left lower extremity    Peripheral vascular disease (HCC)    Right lower lobe pulmonary nodule 06/20/2021   a.) Dotatate PET 06/20/2021: posterior RLL 0.6 x 0.5 mm: b.) LDCT chest 07/27/2022: 0.8 x 0.6 mm; c.) Dotatate PET 02/04/2023: 0.7 x 0.7 mm; d.) CT chest 05/13/2023: 8 x 11 mm; e.) super D chest CT 07/17/2023: spiclated morphology 9 x 8 mm; f.) CT chest 12/09/2023: cavitary spiculated nodule 10 x 7 mm   Rotator cuff tendonitis    Shingles    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)    a.) Tx'd with azathioprine + hydroxychloroquine   Urinary frequency      Family History  Problem Relation Age of Onset   Leukemia Mother        dx 66s   Hyperlipidemia Father    Hypertension Father    Diabetes Father    Colon cancer Father        dx 57s   Hearing loss Father    Vascular Disease Father    Lung cancer Brother        d. 31     Past Surgical History:  Procedure Laterality Date   BACK SURGERY     BIOPSY  08/28/2021   Procedure: BIOPSY;  Surgeon: Mansouraty, Netty Starring., MD;  Location: [facility];  Service: Gastroenterology;;   BRONCHIAL BIOPSY  07/18/2023   Procedure: BRONCHIAL BIOPSIES;  Surgeon: Raechel Chute, MD;  Location: [facility];  Service: Pulmonary;;   BRONCHIAL BIOPSY Right    BRONCHIAL BIOPSY Right 12/31/2023   BRONCHIAL NEEDLE ASPIRATION BIOPSY  07/18/2023   Procedure: BRONCHIAL NEEDLE ASPIRATION BIOPSIES;  Surgeon: Raechel Chute, MD;  Location: [facility];  Service: Pulmonary;;   BRONCHIAL WASHINGS  07/18/2023   Procedure: BRONCHIAL WASHINGS;  Surgeon: Raechel Chute, MD;  Location: [facility];  Service: Pulmonary;;   BROW LIFT Bilateral 01/27/2021   Procedure: BLEPHAROPLASTY UPPER EYELID; W/EXCESS SKIN BROW PTOSIS REPAIR AND  BLEPHAROPTOSIS REPAIR; RESECT EX BILATERAL;  Surgeon: Imagene Riches, MD;  Location: [facility];  Service: Ophthalmology;  Laterality: Bilateral;   COLONOSCOPY     COLONOSCOPY WITH PROPOFOL N/A 09/30/2019   Procedure: COLONOSCOPY WITH PROPOFOL;  Surgeon: Earline Mayotte, MD;  Location: [facility];  Service: Endoscopy;  Laterality: N/A;   COLONOSCOPY WITH PROPOFOL N/A 05/05/2021   Procedure: COLONOSCOPY WITH PROPOFOL;  Surgeon: Regis Bill, MD;  Location: [facility];  Service: Endoscopy;  Laterality: N/A;   CORONARY ANGIOPLASTY WITH STENT PLACEMENT Left 12/08/2010   Procedure: CORONARY ANGIOPLASTY WITH STENT PLACEMENT (pLAD); Procedure: [facility]   ESOPHAGOGASTRODUODENOSCOPY     ESOPHAGOGASTRODUODENOSCOPY (EGD) WITH PROPOFOL N/A 05/05/2021   Procedure: ESOPHAGOGASTRODUODENOSCOPY (EGD) WITH PROPOFOL;  Surgeon: Regis Bill, MD;  Location: [facility];  Service: Endoscopy;  Laterality: N/A;   ESOPHAGOGASTRODUODENOSCOPY (EGD) WITH PROPOFOL N/A 08/28/2021   Procedure: ESOPHAGOGASTRODUODENOSCOPY (EGD) WITH PROPOFOL;  Surgeon: Meridee Score Netty Starring., MD;  Location: [facility];  Service: Gastroenterology;  Laterality: N/A;   ESOPHAGOGASTRODUODENOSCOPY (EGD) WITH PROPOFOL N/A 08/17/2022   Procedure: ESOPHAGOGASTRODUODENOSCOPY (EGD) WITH PROPOFOL;  Surgeon: Regis Bill, MD;  Location: [facility];  Service: Endoscopy;  Laterality: N/A;   EUS N/A 08/28/2021   Procedure: UPPER ENDOSCOPIC ULTRASOUND (EUS) RADIAL;  Surgeon: Lemar Lofty., MD;  Location: [facility];  Service: Gastroenterology;  Laterality: N/A;   LOWER EXTREMITY ANGIOGRAPHY Left 01/10/2024   Procedure: Lower Extremity Angiography;  Surgeon: Renford Dills, MD;  Location: [facility];  Service: Cardiovascular;  Laterality: Left;  LUMBAR LAMINECTOMY/DECOMPRESSION MICRODISCECTOMY Left 02/03/2015   Procedure: LUMBAR LAMINECTOMY/DECOMPRESSION MICRODISCECTOMY LEFT LUMBAR FOUR-FIVE  WITH REMOVAL OF SYNOVIAL CYST;  Surgeon: Tressie Stalker, MD;  Location: [facility];  Service: Neurosurgery;  Laterality: Left;   NASAL SEPTOPLASTY W/ TURBINOPLASTY Bilateral 10/23/2019   Procedure: NASAL SEPTOPLASTY WITH SUBMUCOUS RESECTION OF TURBINATE;  Surgeon: Linus Salmons, MD;  Location: [facility];  Service: ENT;  Laterality: Bilateral;   PELVIC ANGIOGRAPHY N/A 05/02/2018   Procedure: PELVIC ANGIOGRAPHY;  Surgeon: Renford Dills, MD;  Location: [facility];  Service: Cardiovascular;  Laterality: N/A;   POLYPECTOMY  08/28/2021   Procedure: POLYPECTOMY;  Surgeon: Meridee Score Netty Starring., MD;  Location: [facility];  Service: Gastroenterology;;   SHOULDER ARTHROSCOPY WITH SUBACROMIAL DECOMPRESSION AND BICEP TENDON REPAIR Right 12/04/2018   Procedure: SHOULDER ARTHROSCOPY WITH DEBRIDEMENT, DECOMPRESSION, BICEP TENODESIS;  Surgeon: Christena Flake, MD;  Location: [facility];  Service: Orthopedics;  Laterality: Right;   SUBMUCOSAL INJECTION  08/28/2021   Procedure: SUBMUCOSAL INJECTION;  Surgeon: Meridee Score Netty Starring., MD;  Location: [facility];  Service: Gastroenterology;;    Social History   Socioeconomic History   Marital status: Married    Spouse name: Not on file   Number of children: Not on file   Years of education: Not on file   Highest education level: Not on file  Occupational History   Not on file  Tobacco Use   Smoking status: Former    Current packs/day: 0.00    Average packs/day: 1.5 packs/day for 30.0 years (45.0 ttl pk-yrs)    Types: Cigarettes    Start date: 04/01/1991    Quit date: 03/31/2021    Years since quitting: 2.8   Smokeless tobacco: Never  Vaping Use   Vaping status: Never Used  Substance and Sexual Activity   Alcohol use: Yes    Alcohol/week: 14.0 standard drinks of alcohol    Types: 14 Standard drinks or equivalent per week    Comment: cocktails about 2 daily   Drug use: No   Sexual activity: Yes  Other Topics Concern    Not on file  Social History Narrative   Not on file   Social Drivers of Health   Financial Resource Strain: Not on file  Food Insecurity: Not on file  Transportation Needs: Not on file  Physical Activity: Not on file  Stress: Not on file  Social Connections: Not on file  Intimate Partner Violence: Not on file     Allergies  Allergen Reactions   Fosamax [Alendronate Sodium]     Leg pain   Percocet [Oxycodone-Acetaminophen] Nausea And Vomiting     CBC    Component Value Date/Time   WBC 5.1 01/14/2024 1023   WBC 6.1 04/22/2023 1519   RBC 4.67 01/14/2024 1023   HGB 15.3 01/14/2024 1023   HCT 44.3 01/14/2024 1023   PLT 235 01/14/2024 1023   MCV 94.9 01/14/2024 1023   MCH 32.8 01/14/2024 1023   MCHC 34.5 01/14/2024 1023   RDW 13.0 01/14/2024 1023   LYMPHSABS 0.8 01/14/2024 1023   MONOABS 0.5 01/14/2024 1023   EOSABS 0.2 01/14/2024 1023   BASOSABS 0.0 01/14/2024 1023    Pulmonary Functions Testing Results:     No data to display          Outpatient Medications Prior to Visit  Medication Sig Dispense Refill   ALPRAZolam (XANAX) 0.5 MG tablet Take 0.5 mg by mouth 3 (three) times daily as needed for anxiety.  amLODipine (NORVASC) 2.5 MG tablet Take 5 mg by mouth daily.     aspirin EC 81 MG tablet Take 81 mg by mouth daily.     azaTHIOprine (IMURAN) 50 MG tablet Take 125 mg by mouth daily.      cetirizine (ZYRTEC) 10 MG tablet Take 10 mg by mouth daily.     clopidogrel (PLAVIX) 75 MG tablet Take 1 tablet (75 mg total) by mouth daily. 30 tablet 11   cyanocobalamin (,VITAMIN B-12,) 1000 MCG/ML injection Inject 1,000 mcg into the muscle every 14 (fourteen) days.      ELDERBERRY PO Take 2 tablets by mouth daily.     finasteride (PROSCAR) 5 MG tablet Take 1 tablet (5 mg total) by mouth daily. 90 tablet 3   hydrochlorothiazide (HYDRODIURIL) 12.5 MG tablet Take 12.5 mg by mouth daily.     hydroxychloroquine (PLAQUENIL) 200 MG tablet Take 200 mg by mouth 2 (two) times  daily.     ibuprofen (ADVIL,MOTRIN) 200 MG tablet Take 400 mg by mouth every 8 (eight) hours as needed for headache or moderate pain.     metoprolol succinate (TOPROL-XL) 50 MG 24 hr tablet Take 50 mg by mouth at bedtime. Take with or immediately following a meal.     nicotine (NICODERM CQ - DOSED IN MG/24 HOURS) 21 mg/24hr patch Place 21 mg onto the skin daily.     nitroGLYCERIN (NITROLINGUAL) 0.4 MG/SPRAY spray Place 1 spray under the tongue every 5 (five) minutes x 3 doses as needed for chest pain.     pantoprazole (PROTONIX) 20 MG tablet Take 1 tablet (20 mg total) by mouth daily. 30 tablet 11   pravastatin (PRAVACHOL) 40 MG tablet Take 40 mg by mouth at bedtime.     Probiotic Product (PROBIOTIC DAILY PO) Take 1 capsule by mouth daily.     spironolactone (ALDACTONE) 25 MG tablet Take 25 mg by mouth daily.     tadalafil (CIALIS) 20 MG tablet Take 1 tablet (20 mg total) by mouth daily as needed for erectile dysfunction. 30 tablet 6   tamsulosin (FLOMAX) 0.4 MG CAPS capsule Take 1 capsule (0.4 mg total) by mouth daily after breakfast. 30 capsule 11   triamcinolone (NASACORT) 55 MCG/ACT AERO nasal inhaler Place 1 spray into the nose daily as needed (allergies).     No facility-administered medications prior to visit.

## 2024-01-25 LAB — 5 HIAA, QUANTITATIVE, URINE, 24 HOUR
5-HIAA, Ur: 1.6 mg/L
5-HIAA,Quant.,24 Hr Urine: 3.9 mg/(24.h) (ref 0.0–14.9)
Total Volume: 2450

## 2024-01-28 ENCOUNTER — Ambulatory Visit
Admission: RE | Admit: 2024-01-28 | Discharge: 2024-01-28 | Disposition: A | Discharge status: Home / Self Care | Source: Ambulatory Visit | Attending: Oncology | Admitting: Oncology

## 2024-01-28 DIAGNOSIS — C7A8 Other malignant neuroendocrine tumors: Secondary | ICD-10-CM | POA: Insufficient documentation

## 2024-01-28 MED ORDER — IOHEXOL 300 MG/ML  SOLN
100.0000 mL | Freq: Once | INTRAMUSCULAR | Status: AC | PRN
  Administered 2024-01-28: 100 mL via INTRAVENOUS

## 2024-01-29 ENCOUNTER — Other Ambulatory Visit (INDEPENDENT_AMBULATORY_CARE_PROVIDER_SITE_OTHER): Payer: Self-pay | Admitting: Vascular Surgery

## 2024-01-29 DIAGNOSIS — Z9889 Other specified postprocedural states: Secondary | ICD-10-CM

## 2024-01-31 ENCOUNTER — Ambulatory Visit (INDEPENDENT_AMBULATORY_CARE_PROVIDER_SITE_OTHER): Payer: PRIVATE HEALTH INSURANCE

## 2024-01-31 ENCOUNTER — Encounter (INDEPENDENT_AMBULATORY_CARE_PROVIDER_SITE_OTHER): Payer: Medicare Other

## 2024-01-31 DIAGNOSIS — Z9889 Other specified postprocedural states: Secondary | ICD-10-CM | POA: Diagnosis not present

## 2024-01-31 DIAGNOSIS — I739 Peripheral vascular disease, unspecified: Secondary | ICD-10-CM

## 2024-02-03 ENCOUNTER — Encounter (INDEPENDENT_AMBULATORY_CARE_PROVIDER_SITE_OTHER): Payer: Self-pay | Admitting: Vascular Surgery

## 2024-02-03 ENCOUNTER — Ambulatory Visit (INDEPENDENT_AMBULATORY_CARE_PROVIDER_SITE_OTHER): Payer: PRIVATE HEALTH INSURANCE | Admitting: Vascular Surgery

## 2024-02-03 VITALS — BP 124/84 | HR 89 | Resp 16 | Wt 194.4 lb

## 2024-02-03 DIAGNOSIS — I7143 Infrarenal abdominal aortic aneurysm, without rupture: Secondary | ICD-10-CM | POA: Diagnosis not present

## 2024-02-03 DIAGNOSIS — E785 Hyperlipidemia, unspecified: Secondary | ICD-10-CM

## 2024-02-03 DIAGNOSIS — I2510 Atherosclerotic heart disease of native coronary artery without angina pectoris: Secondary | ICD-10-CM | POA: Diagnosis not present

## 2024-02-03 DIAGNOSIS — I70213 Atherosclerosis of native arteries of extremities with intermittent claudication, bilateral legs: Secondary | ICD-10-CM

## 2024-02-03 DIAGNOSIS — I10 Essential (primary) hypertension: Secondary | ICD-10-CM

## 2024-02-03 DIAGNOSIS — I708 Atherosclerosis of other arteries: Secondary | ICD-10-CM | POA: Diagnosis not present

## 2024-02-03 LAB — VAS US ABI WITH/WO TBI
Left ABI: 1.15
Right ABI: 1.1

## 2024-02-03 NOTE — Progress Notes (Unsigned)
 MRN : [id number]  Sergio Callahan is a 73 y.o. (1951-10-09) male who presents with chief complaint of check circulation.  History of Present Illness:   The patient returns to the office for followup and review status post angiogram with intervention on 01/10/2024.   Procedure: Percutaneous transluminal angioplasty and stent placement left popliteal artery mid segment.   The patient notes improvement in the lower extremity symptoms. No interval shortening of the patient's claudication distance or rest pain symptoms. No new ulcers or wounds have occurred since the last visit.  There have been no significant changes to the patient's overall health care.  No documented history of amaurosis fugax or recent TIA symptoms. There are no recent neurological changes noted. No documented history of DVT, PE or superficial thrombophlebitis. The patient denies recent episodes of angina or shortness of breath.   ABI's Rt=*** and Lt=***  (previous ABI's Rt=*** and Lt=***) Duplex US of the *** lower extremity arterial system shows ***  Current Meds  Medication Sig   ALPRAZolam (XANAX) 0.5 MG tablet Take 0.5 mg by mouth 3 (three) times daily as needed for anxiety.    amLODipine (NORVASC) 2.5 MG tablet Take 5 mg by mouth daily.   aspirin EC 81 MG tablet Take 81 mg by mouth daily.   azaTHIOprine (IMURAN) 50 MG tablet Take 125 mg by mouth daily.    cetirizine (ZYRTEC) 10 MG tablet Take 10 mg by mouth daily.   clopidogrel (PLAVIX) 75 MG tablet Take 1 tablet (75 mg total) by mouth daily.   cyanocobalamin (,VITAMIN B-12,) 1000 MCG/ML injection Inject 1,000 mcg into the muscle every 14 (fourteen) days.    ELDERBERRY PO Take 2 tablets by mouth daily.   finasteride (PROSCAR) 5 MG tablet Take 1 tablet (5 mg total) by mouth daily.   hydrochlorothiazide (HYDRODIURIL) 12.5 MG tablet Take 12.5 mg by mouth daily.   hydroxychloroquine (PLAQUENIL)  200 MG tablet Take 200 mg by mouth 2 (two) times daily.   ibuprofen (ADVIL,MOTRIN) 200 MG tablet Take 400 mg by mouth every 8 (eight) hours as needed for headache or moderate pain.   metoprolol succinate (TOPROL-XL) 50 MG 24 hr tablet Take 50 mg by mouth at bedtime. Take with or immediately following a meal.   nicotine (NICODERM CQ - DOSED IN MG/24 HOURS) 21 mg/24hr patch Place 21 mg onto the skin daily.   nitroGLYCERIN (NITROLINGUAL) 0.4 MG/SPRAY spray Place 1 spray under the tongue every 5 (five) minutes x 3 doses as needed for chest pain.   pantoprazole (PROTONIX) 20 MG tablet Take 1 tablet (20 mg total) by mouth daily.   pravastatin (PRAVACHOL) 40 MG tablet Take 40 mg by mouth at bedtime.   Probiotic Product (PROBIOTIC DAILY PO) Take 1 capsule by mouth daily.   spironolactone (ALDACTONE) 25 MG tablet Take 25 mg by mouth daily.   tadalafil (CIALIS) 20 MG tablet Take 1 tablet (20 mg total) by mouth daily as needed for erectile dysfunction.   tamsulosin (FLOMAX) 0.4 MG CAPS capsule Take 1 capsule (0.4 mg total) by mouth daily after breakfast.   triamcinolone (NASACORT) 55  MCG/ACT AERO nasal inhaler Place 1 spray into the nose daily as needed (allergies).    Past Medical History:  Diagnosis Date   AAA (abdominal aortic aneurysm) (HCC)    Abdominal aortic aneurysm without rupture (HCC)    Adenomatous colon polyp    Allergic rhinitis    Anemia    Anogenital (venereal) warts    Anxiety    a.) on BZO PRN (alprazolam)   Aortic atherosclerosis (HCC)    Arthritis    Atherosclerosis of native artery of right lower extremity with intermittent claudication (HCC)    B12 deficiency    Back pain    Bilateral adrenal adenomas    BPH (benign prostatic hyperplasia)    Carpal tunnel syndrome    Celiac artery atherosclerosis    Coronary artery disease    a.) MV 02/26/2005: no ischemia; b.) MV 11/24/2010: mild inferosep ischemia; c.) LHC/PCI 12/08/2010: 99% pLAD --> rotational athrectomy + 3.0 x 23 mm  Xience DES x 1; d.) MV 02/15/2011: no ischemia; e.) MV 02/14/2018: no ischemia   Degenerative joint disease    DOE (dyspnea on exertion)    Duodenitis    Elevated liver enzymes 05/2023   Erectile dysfunction    a.) on PDE5i (tadalafil)   GERD (gastroesophageal reflux disease)    Hepatic steatosis    History of shingles    History of tobacco abuse    HPV in male    Hx of adenomatous colonic polyps    Hyperlipidemia    Hypertension    Hyponatremia    Iliac artery aneurysm (HCC)    Long term (current) use of aspirin    Long term current use of immunosuppressive drug    a.) Tx'd with azathioprine + hydroxychloroquine for SLE   Lumbar stenosis    MGUS (monoclonal gammopathy of unknown significance)    Multiple pulmonary nodules determined by computed tomography of lung    Neuroendocrine carcinoma of stomach (HCC) 2023   Nocturia    Nodule of lower lobe of right lung 06/2023   Paresthesia of left lower extremity    Peripheral vascular disease (HCC)    Right lower lobe pulmonary nodule 06/20/2021   a.) Dotatate PET 06/20/2021: posterior RLL 0.6 x 0.5 mm: b.) LDCT chest 07/27/2022: 0.8 x 0.6 mm; c.) Dotatate PET 02/04/2023: 0.7 x 0.7 mm; d.) CT chest 05/13/2023: 8 x 11 mm; e.) super D chest CT 07/17/2023: spiclated morphology 9 x 8 mm; f.) CT chest 12/09/2023: cavitary spiculated nodule 10 x 7 mm   Rotator cuff tendonitis    Shingles    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)    a.) Tx'd with azathioprine + hydroxychloroquine   Urinary frequency     Past Surgical History:  Procedure Laterality Date   BACK SURGERY     BIOPSY  08/28/2021   Procedure: BIOPSY;  Surgeon: Lemar Lofty., MD;  Location: [facility];  Service: Gastroenterology;;   BRONCHIAL BIOPSY  07/18/2023   Procedure: BRONCHIAL BIOPSIES;  Surgeon: Raechel Chute, MD;  Location: [facility];  Service: Pulmonary;;   BRONCHIAL BIOPSY Right    BRONCHIAL BIOPSY Right 12/31/2023   BRONCHIAL  NEEDLE ASPIRATION BIOPSY  07/18/2023   Procedure: BRONCHIAL NEEDLE ASPIRATION BIOPSIES;  Surgeon: Raechel Chute, MD;  Location: [facility];  Service: Pulmonary;;   BRONCHIAL WASHINGS  07/18/2023   Procedure: BRONCHIAL WASHINGS;  Surgeon: Raechel Chute, MD;  Location: [facility];  Service: Pulmonary;;   BROW LIFT Bilateral 01/27/2021   Procedure: BLEPHAROPLASTY UPPER EYELID; W/EXCESS  SKIN BROW PTOSIS REPAIR AND BLEPHAROPTOSIS REPAIR; RESECT EX BILATERAL;  Surgeon: Imagene Riches, MD;  Location: [facility];  Service: Ophthalmology;  Laterality: Bilateral;   COLONOSCOPY     COLONOSCOPY WITH PROPOFOL N/A 09/30/2019   Procedure: COLONOSCOPY WITH PROPOFOL;  Surgeon: Earline Mayotte, MD;  Location: [facility];  Service: Endoscopy;  Laterality: N/A;   COLONOSCOPY WITH PROPOFOL N/A 05/05/2021   Procedure: COLONOSCOPY WITH PROPOFOL;  Surgeon: Regis Bill, MD;  Location: [facility];  Service: Endoscopy;  Laterality: N/A;   CORONARY ANGIOPLASTY WITH STENT PLACEMENT Left 12/08/2010   Procedure: CORONARY ANGIOPLASTY WITH STENT PLACEMENT (pLAD); Procedure: [facility]   ESOPHAGOGASTRODUODENOSCOPY     ESOPHAGOGASTRODUODENOSCOPY (EGD) WITH PROPOFOL N/A 05/05/2021   Procedure: ESOPHAGOGASTRODUODENOSCOPY (EGD) WITH PROPOFOL;  Surgeon: Regis Bill, MD;  Location: [facility];  Service: Endoscopy;  Laterality: N/A;   ESOPHAGOGASTRODUODENOSCOPY (EGD) WITH PROPOFOL N/A 08/28/2021   Procedure: ESOPHAGOGASTRODUODENOSCOPY (EGD) WITH PROPOFOL;  Surgeon: Meridee Score Netty Starring., MD;  Location: [facility];  Service: Gastroenterology;  Laterality: N/A;   ESOPHAGOGASTRODUODENOSCOPY (EGD) WITH PROPOFOL N/A 08/17/2022   Procedure: ESOPHAGOGASTRODUODENOSCOPY (EGD) WITH PROPOFOL;  Surgeon: Regis Bill, MD;  Location: [facility];  Service: Endoscopy;  Laterality: N/A;   EUS N/A 08/28/2021   Procedure: UPPER ENDOSCOPIC ULTRASOUND (EUS) RADIAL;  Surgeon: Lemar Lofty., MD;   Location: [facility];  Service: Gastroenterology;  Laterality: N/A;   LOWER EXTREMITY ANGIOGRAPHY Left 01/10/2024   Procedure: Lower Extremity Angiography;  Surgeon: Renford Dills, MD;  Location: [facility];  Service: Cardiovascular;  Laterality: Left;   LUMBAR LAMINECTOMY/DECOMPRESSION MICRODISCECTOMY Left 02/03/2015   Procedure: LUMBAR LAMINECTOMY/DECOMPRESSION MICRODISCECTOMY LEFT LUMBAR FOUR-FIVE WITH REMOVAL OF SYNOVIAL CYST;  Surgeon: Tressie Stalker, MD;  Location: [facility];  Service: Neurosurgery;  Laterality: Left;   NASAL SEPTOPLASTY W/ TURBINOPLASTY Bilateral 10/23/2019   Procedure: NASAL SEPTOPLASTY WITH SUBMUCOUS RESECTION OF TURBINATE;  Surgeon: Linus Salmons, MD;  Location: [facility];  Service: ENT;  Laterality: Bilateral;   PELVIC ANGIOGRAPHY N/A 05/02/2018   Procedure: PELVIC ANGIOGRAPHY;  Surgeon: Renford Dills, MD;  Location: [facility];  Service: Cardiovascular;  Laterality: N/A;   POLYPECTOMY  08/28/2021   Procedure: POLYPECTOMY;  Surgeon: Meridee Score Netty Starring., MD;  Location: [facility];  Service: Gastroenterology;;   SHOULDER ARTHROSCOPY WITH SUBACROMIAL DECOMPRESSION AND BICEP TENDON REPAIR Right 12/04/2018   Procedure: SHOULDER ARTHROSCOPY WITH DEBRIDEMENT, DECOMPRESSION, BICEP TENODESIS;  Surgeon: Christena Flake, MD;  Location: [facility];  Service: Orthopedics;  Laterality: Right;   SUBMUCOSAL INJECTION  08/28/2021   Procedure: SUBMUCOSAL INJECTION;  Surgeon: Meridee Score Netty Starring., MD;  Location: [facility];  Service: Gastroenterology;;    Social History Social History   Tobacco Use   Smoking status: Former    Current packs/day: 0.00    Average packs/day: 1.5 packs/day for 30.0 years (45.0 ttl pk-yrs)    Types: Cigarettes    Start date: 04/01/1991    Quit date: 03/31/2021    Years since quitting: 2.8   Smokeless tobacco: Never  Vaping Use   Vaping status: Never Used  Substance Use Topics   Alcohol use: Yes     Alcohol/week: 14.0 standard drinks of alcohol    Types: 14 Standard drinks or equivalent per week    Comment: cocktails about 2 daily   Drug use: No    Family History Family History  Problem Relation Age of Onset   Leukemia Mother        dx 84s   Hyperlipidemia Father  Hypertension Father    Diabetes Father    Colon cancer Father        dx 70s   Hearing loss Father    Vascular Disease Father    Lung cancer Brother        d. 68    Allergies  Allergen Reactions   Fosamax [Alendronate Sodium]     Leg pain   Percocet [Oxycodone-Acetaminophen] Nausea And Vomiting     REVIEW OF SYSTEMS (Negative unless checked)  Constitutional: [] Weight loss  [] Fever  [] Chills Cardiac: [] Chest pain   [] Chest pressure   [] Palpitations   [] Shortness of breath when laying flat   [] Shortness of breath with exertion. Vascular:  [x] Pain in legs with walking   [] Pain in legs at rest  [] History of DVT   [] Phlebitis   [] Swelling in legs   [] Varicose veins   [] Non-healing ulcers Pulmonary:   [] Uses home oxygen   [] Productive cough   [] Hemoptysis   [] Wheeze  [] COPD   [] Asthma Neurologic:  [] Dizziness   [] Seizures   [] History of stroke   [] History of TIA  [] Aphasia   [] Vissual changes   [] Weakness or numbness in arm   [] Weakness or numbness in leg Musculoskeletal:   [] Joint swelling   [] Joint pain   [] Low back pain Hematologic:  [] Easy bruising  [] Easy bleeding   [] Hypercoagulable state   [] Anemic Gastrointestinal:  [] Diarrhea   [] Vomiting  [] Gastroesophageal reflux/heartburn   [] Difficulty swallowing. Genitourinary:  [] Chronic kidney disease   [] Difficult urination  [] Frequent urination   [] Blood in urine Skin:  [] Rashes   [] Ulcers  Psychological:  [] History of anxiety   []  History of major depression.  Physical Examination  Vitals:   02/03/24 0946  BP: 124/84  Pulse: 89  Resp: 16  Weight: 194 lb 6.4 oz (88.2 kg)   Body mass index is 26.37 kg/m. Gen: WD/WN, NAD Head: [place]/AT, No temporalis  wasting.  Ear/Nose/Throat: Hearing grossly intact, nares w/o erythema or drainage Eyes: PER, EOMI, sclera nonicteric.  Neck: Supple, no masses.  No bruit or JVD.  Pulmonary:  Good air movement, no audible wheezing, no use of accessory muscles.  Cardiac: RRR, normal S1, S2, no Murmurs. Vascular:  mild trophic changes, no open wounds Vessel Right Left  Radial Palpable Palpable  PT Not Palpable Not Palpable  DP Not Palpable Not Palpable  Gastrointestinal: soft, non-distended. No guarding/no peritoneal signs.  Musculoskeletal: M/S 5/5 throughout.  No visible deformity.  Neurologic: CN 2-12 intact. Pain and light touch intact in extremities.  Symmetrical.  Speech is fluent. Motor exam as listed above. Psychiatric: Judgment intact, Mood & affect appropriate for pt's clinical situation. Dermatologic: No rashes or ulcers noted.  No changes consistent with cellulitis.   CBC Lab Results  Component Value Date   WBC 5.1 01/14/2024   HGB 15.3 01/14/2024   HCT 44.3 01/14/2024   MCV 94.9 01/14/2024   PLT 235 01/14/2024    BMET    Component Value Date/Time   NA 133 (L) 01/14/2024 1023   K 3.9 01/14/2024 1023   CL 99 01/14/2024 1023   CO2 26 01/14/2024 1023   GLUCOSE 100 (H) 01/14/2024 1023   BUN 14 01/14/2024 1023   CREATININE 0.82 01/14/2024 1023   CALCIUM 8.9 01/14/2024 1023   GFRNONAA >60 01/14/2024 1023   GFRAA >60 05/02/2018 1209   Estimated Creatinine Clearance: 92 mL/min (by C-G formula based on SCr of 0.82 mg/dL).  COAG No results found for: "INR", "PROTIME"  Radiology VAS [place] ABI WITH/WO  TBI Result Date: 02/03/2024  LOWER EXTREMITY DOPPLER STUDY Patient Name:  Sergio Callahan  Date of Exam:   01/31/2024 Medical Rec #: [id number]       Accession #:    [id number] Date of Birth: Jul 14, 1951       Patient Gender: M Patient Age:   61 years Exam Location:  [facility] Procedure:      VAS [place] ABI WITH/WO TBI Referring Phys:  --------------------------------------------------------------------------------  Indications: Claudication, and peripheral artery disease. High Risk Factors: Hyperlipidemia.  Vascular Interventions: Bilat CIA stents 2019                         01/10/2024 Percutaneous transluminal angioplasty and                         stent placement left popliteal artery mid segment. Performing Technologist: Salvadore Farber RVT  Examination Guidelines: A complete evaluation includes at minimum, Doppler waveform signals and systolic blood pressure reading at the level of bilateral brachial, anterior tibial, and posterior tibial arteries, when vessel segments are accessible. Bilateral testing is considered an integral part of a complete examination. Photoelectric Plethysmograph (PPG) waveforms and toe systolic pressure readings are included as required and additional duplex testing as needed. Limited examinations for reoccurring indications may be performed as noted.  ABI Findings: +---------+------------------+-----+---------+--------+ Right    Rt Pressure (mmHg)IndexWaveform Comment  +---------+------------------+-----+---------+--------+ Brachial 143                                      +---------+------------------+-----+---------+--------+ PTA      157               1.10 triphasic         +---------+------------------+-----+---------+--------+ DP       147               1.03 triphasic         +---------+------------------+-----+---------+--------+ Great Toe99                0.69 Normal            +---------+------------------+-----+---------+--------+ +---------+------------------+-----+---------+-------+ Left     Lt Pressure (mmHg)IndexWaveform Comment +---------+------------------+-----+---------+-------+ Brachial 140                                     +---------+------------------+-----+---------+-------+ PTA      163               1.14 triphasic         +---------+------------------+-----+---------+-------+ DP       164               1.15 triphasic        +---------+------------------+-----+---------+-------+ Great Toe130               0.91 Normal           +---------+------------------+-----+---------+-------+ +-------+-----------+-----------+------------+------------+ ABI/TBIToday's ABIToday's TBIPrevious ABIPrevious TBI +-------+-----------+-----------+------------+------------+ Right  1.10       .70        1.19        .62          +-------+-----------+-----------+------------+------------+ Left   1.15       .91        .80         .52          +-------+-----------+-----------+------------+------------+  Left ABIs and TBIs appear increased compared to prior study on 12/2023.  Summary: Right: Resting right ankle-brachial index is within normal range. The right toe-brachial index is normal. Left: Resting left ankle-brachial index is within normal range. The left toe-brachial index is normal. *See table(s) above for measurements and observations.  Electronically signed by Levora Dredge MD on 02/03/2024 at 7:40:20 AM.    Final    PERIPHERAL VASCULAR CATHETERIZATION Result Date: 01/10/2024 See surgical note for result.    Assessment/Plan There are no diagnoses linked to this encounter.   Levora Dredge, MD  02/03/2024 9:56 AM

## 2024-02-04 ENCOUNTER — Ambulatory Visit
Admission: RE | Admit: 2024-02-04 | Discharge: 2024-02-04 | Disposition: A | Discharge status: Home / Self Care | Source: Ambulatory Visit | Attending: Student in an Organized Health Care Education/Training Program | Admitting: Student in an Organized Health Care Education/Training Program

## 2024-02-04 ENCOUNTER — Encounter (INDEPENDENT_AMBULATORY_CARE_PROVIDER_SITE_OTHER): Payer: Self-pay | Admitting: Vascular Surgery

## 2024-02-04 DIAGNOSIS — R911 Solitary pulmonary nodule: Secondary | ICD-10-CM | POA: Diagnosis present

## 2024-02-04 LAB — GLUCOSE, CAPILLARY: Glucose-Capillary: 89 mg/dL (ref 70–99)

## 2024-02-04 MED ORDER — FLUDEOXYGLUCOSE F - 18 (FDG) INJECTION
10.7100 | Freq: Once | INTRAVENOUS | Status: AC | PRN
  Administered 2024-02-04: 10.71 via INTRAVENOUS

## 2024-02-10 ENCOUNTER — Telehealth: Payer: Self-pay | Admitting: *Deleted

## 2024-02-10 ENCOUNTER — Telehealth: Payer: Self-pay

## 2024-02-10 NOTE — Telephone Encounter (Signed)
 Copied from CRM [id number]. Topic: Clinical - Lab/Test Results >> Feb 10, 2024  2:07 PM Orinda Kenner C wrote: Reason for CRM: Patient [phone] would like PET scan results, please call back.  Patient advised that we do not have results yet.

## 2024-02-10 NOTE — Telephone Encounter (Signed)
 The scan done 3/18, he had appts ffor 3/10 for labs and see md on 3/17. He did not come on those dates and I bet maybe tried to move it out . He wants the results of the scan results and did not know if you bring in soon to get results

## 2024-02-11 ENCOUNTER — Other Ambulatory Visit: Payer: Self-pay

## 2024-02-11 DIAGNOSIS — N289 Disorder of kidney and ureter, unspecified: Secondary | ICD-10-CM

## 2024-02-11 DIAGNOSIS — R9389 Abnormal findings on diagnostic imaging of other specified body structures: Secondary | ICD-10-CM

## 2024-02-11 NOTE — Telephone Encounter (Signed)
 Call made to pt, no answer, detailed VM left. Mychart message also sent with CT results and plan for MRI and urology referral.   Please schedule MRI abdomen and notify pt of appt details.

## 2024-02-11 NOTE — Telephone Encounter (Signed)
 Patient advised as below. Patient will await for official report. Nothing further needed.

## 2024-02-18 ENCOUNTER — Ambulatory Visit: Payer: Self-pay | Admitting: General Surgery

## 2024-02-18 ENCOUNTER — Ambulatory Visit
Admission: RE | Admit: 2024-02-18 | Discharge: 2024-02-18 | Disposition: A | Discharge status: Home / Self Care | Source: Ambulatory Visit | Attending: Oncology | Admitting: Oncology

## 2024-02-18 ENCOUNTER — Ambulatory Visit: Payer: PRIVATE HEALTH INSURANCE

## 2024-02-18 DIAGNOSIS — R9389 Abnormal findings on diagnostic imaging of other specified body structures: Secondary | ICD-10-CM | POA: Diagnosis present

## 2024-02-18 DIAGNOSIS — N289 Disorder of kidney and ureter, unspecified: Secondary | ICD-10-CM | POA: Diagnosis present

## 2024-02-18 MED ORDER — GADOBUTROL 1 MMOL/ML IV SOLN
9.0000 mL | Freq: Once | INTRAVENOUS | Status: AC | PRN
  Administered 2024-02-18: 9 mL via INTRAVENOUS

## 2024-02-18 NOTE — H&P (Signed)
 History of Present Illness Sergio Callahan is a 73 year old male who presents for evaluation of removal of anal tags and examination under anesthesia.  He has had anal tags for several years, which have recently become more uncomfortable. The tags are larger and cause discomfort and pain, particularly during prolonged sitting or using the bathroom. He experiences occasional irritation and minor bleeding from the tags.  He was previously evaluated on January 07, 2024, but the surgery was not performed due to the placement of a stent on his left knee. He is currently on both Plavix and aspirin. There was a discussion about the necessity of continuing these medications due to the recent stent placement.  No new discomfort, bleeding, or pain since the last evaluation.      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    Hx of adenomatous colonic polyps    Hyperlipidemia    Hypertension    Hyponatremia    Lupus    Monoclonal gammopathy    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/2004   Substance abuse (CMS/HHS-HCC)    cigarette        PAST SURGICAL HISTORY:   Past Surgical History:  Procedure Laterality Date   COLONOSCOPY  06/18/2005   Adenomatous Polyps, FH Colon Polyps (Father)   cardiac catheterization  2012   EGD  10/19/2011   08/13/2008, 06/18/2005; No repeat per RTE   COLONOSCOPY  01/01/2014   08/13/2008; PH Adenomatous Polyps, FHCC (Father), FH Colon Polyps (Father): [facility]  08/2019: Recall ltr mailed    lumbar laminectomy/decompression microdiscectomy Left 02/03/2015   ANGIOGRAPHY PELVIC  05/02/2018   Limited arthroscopic debridement arthroscopic subacromial decompression mini-open rotator cuff repair using a smith & nepew regeneten patch and mini-open biceps tenodesis,right shoulder  Right 12/04/2018   Dr.poggi    COLONOSCOPY  09/30/2019   Normal Colon/PHx CP/Repeat 31yrs/JWB   nasal septoplasty with turbinoplasty Bilateral 10/23/2019   LIFT / REPAIR BROW PTOSIS FOREHEAD Bilateral 01/27/2021   COLONOSCOPY  05/05/2021   Tubular adenomas/Hyperplastic polyps/Repeat 82yrs/CTL   EGD  05/05/2021   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  No atypia.   EGD  08/17/2022   fundic gland polyps/Normal EGD biopsy/Repeat 49yrs/CTL   CORONARY ANGIOPLASTY     ENDOSCOPIC CARPAL TUNNEL RELEASE Right    Left wrist fracture repair Left    MOHS SURGERY     Dr. Lorn Junes         MEDICATIONS:  Outpatient Encounter Medications as of 02/18/2024  Medication Sig Dispense Refill   ALPRAZolam (XANAX) 0.5 MG tablet TAKE 1 TABLET (0.5 MG TOTAL) BY MOUTH 3 (THREE) TIMES DAILY AS NEEDED FOR ANXIETY. 90 tablet 4  amLODIPine (NORVASC) 2.5 MG tablet Take 1 tablet (2.5 mg total) by mouth once daily 90 tablet 3   aspirin 81 MG EC tablet Take 81 mg by mouth once daily.     azaTHIOprine (IMURAN) 50 mg tablet TAKE 2 AND 1/2 TABLETS BY MOUTH ONCE DAILY 225 tablet 1   BD INTEGRA SYRINGE 3 mL 25 gauge x 1" Syrg Inject 1 each into the muscle every 14 (fourteen) days 12 Syringe 0   cetirizine (ZYRTEC) 10 MG tablet Take 10 mg by mouth once daily as needed     clopidogreL (PLAVIX) 75 mg tablet Take 75 mg by mouth once daily     cyanocobalamin (VITAMIN B12) 1,000 mcg/mL injection INJECT 1 ML  (1,000 MCG TOTAL) INTO THE MUSCLE EVERY 14 (FOURTEEN) DAYS 6 mL 0   finasteride (PROSCAR) 5 mg tablet TAKE 1 TABLET BY MOUTH ONCE DAILY 90 tablet 1   fluticasone propionate (FLONASE) 50 mcg/actuation nasal spray Place 1 spray into both nostrils 2 (two) times daily 16 g 0   hydroCHLOROthiazide (HYDRODIURIL) 12.5 MG tablet TAKE 1 TABLET BY MOUTH EVERY DAY 90 tablet 1   hydroxychloroquine (PLAQUENIL) 200 mg tablet Take 1 tablet (200 mg total) by mouth 2 (two) times daily 180 tablet 1   ibuprofen (ADVIL,MOTRIN) 600 MG tablet Take 600 mg by mouth.     lisinopriL (ZESTRIL) 40 MG tablet TAKE 1 TABLET BY MOUTH EVERY DAY 90 tablet 3   metoprolol succinate (TOPROL-XL) 50 MG XL tablet Take 1 tablet (50 mg total) by mouth once daily 90 tablet 3   omeprazole (PRILOSEC) 20 MG DR capsule Take 20 mg by mouth once daily.     pantoprazole (PROTONIX) 20 MG DR tablet Take 20 mg by mouth once daily     pravastatin (PRAVACHOL) 40 MG tablet TALE 1 TABLET BY MOUTH AT BEDTIME 90 tablet 2   promethazine-dextromethorphan (PROMETHAZINE-DM) 6.25-15 mg/5 mL syrup Take 5 mLs by mouth every 6 (six) hours as needed 120 mL 0   sildenafil (REVATIO) 20 mg tablet Take 20 mg by mouth as needed     spironolactone (ALDACTONE) 25 MG tablet Take 25 mg by mouth once daily     tadalafiL (CIALIS) 20 MG tablet Take 20 mg by mouth once daily as needed     tamsulosin (FLOMAX) 0.4 mg capsule TAKE 1 CAP BY MOUTH ONCE DAILY 90 capsule 2   No facility-administered encounter medications on file as of 02/18/2024.     ALLERGIES:   Fosamax [alendronate] and Percocet [oxycodone-acetaminophen]   SOCIAL HISTORY:  Social History   Socioeconomic History   Marital status: Married  Occupational History   Occupation: Retired  Tobacco Use   Smoking status: Former    Current packs/day: 0.00    Average packs/day: 1.5 packs/day for 30.0 years (45.0 ttl pk-yrs)    Types: Cigarettes    Start date: 04/01/1991    Quit date: 03/31/2021    Years since  quitting: 2.8    Passive exposure: Past   Smokeless tobacco: Never  Vaping Use   Vaping status: Never Used  Substance and Sexual Activity   Alcohol use: Yes    Alcohol/week: 0.0 standard drinks of alcohol    Comment: occasionally   Drug use: No   Sexual activity: Defer   Social Drivers of Health   Financial Resource Strain: Low Risk  (02/17/2024)   Overall Financial Resource Strain (CARDIA)    Difficulty of Paying Living Expenses: Not hard at all  Food Insecurity: No Food Insecurity (  02/17/2024)   Hunger Vital Sign    Worried About Running Out of Food in the Last Year: Never true    Ran Out of Food in the Last Year: Never true  Transportation Needs: No Transportation Needs (02/17/2024)   PRAPARE - Administrator, Civil Service (Medical): No    Lack of Transportation (Non-Medical): No    FAMILY HISTORY:  Family History  Problem Relation Name Age of Onset   Cancer Mother     Stroke Mother     Cataracts Mother     Leukemia Mother     High blood pressure (Hypertension) Father     Hyperlipidemia (Elevated cholesterol) Father     Colon polyps Father     Diabetes Father     Cancer Father     Cataracts Father     Colon cancer Father     Hearing loss Father     Breast cancer Neg Hx       GENERAL REVIEW OF SYSTEMS:   General ROS: negative for - chills, fatigue, fever, weight gain or weight loss Allergy and Immunology ROS: negative for - hives  Hematological and Lymphatic ROS: negative for - bleeding problems or bruising, negative for palpable nodes Endocrine ROS: negative for - heat or cold intolerance, hair changes Respiratory ROS: negative for - cough, shortness of breath or wheezing Cardiovascular ROS: no chest pain or palpitations GI ROS: negative for nausea, vomiting, abdominal pain, diarrhea, constipation Musculoskeletal ROS: negative for - joint swelling or muscle pain Neurological ROS: negative for - confusion, syncope Dermatological ROS: negative for  pruritus and rash  PHYSICAL EXAM:  Vitals:   02/18/24 0954  BP: (!) 138/90  Pulse: 83  .  Ht:182.9 cm (6') Wt:88.5 kg (195 lb) AOZ:HYQM surface area is 2.12 meters squared. Body mass index is 26.45 kg/m.Marland Kitchen   GENERAL: Alert, active, oriented x3  HEENT: Pupils equal reactive to light. Extraocular movements are intact. Sclera clear. Palpebral conjunctiva normal red color.Pharynx clear.  NECK: Supple with no palpable mass and no adenopathy.  LUNGS: Sound clear with no rales rhonchi or wheezes.  HEART: Regular rhythm S1 and S2 without murmur.  RECTAL: Multiple large polypoid anal tags there is a lot of skin cyst surrounding the anal area.   EXTREMITIES: Well-developed well-nourished symmetrical with no dependent edema.  NEUROLOGICAL: Awake alert oriented, facial expression symmetrical, moving all extremities.   Assessment & Plan Anal skin tags   Anal skin tags have been present for years, causing discomfort, irritation, and minor bleeding, especially during prolonged sitting or defecation. Remove the tags that bleed and cause discomfort. The procedure can be performed while he is on Plavix and aspirin, as the tags are not too large and good control can be maintained. Send the removed tags to pathology to ensure there is no underlying pathology.  Cysts   Multiple small cysts in the anal area are present but not causing significant discomfort. Clean out some of the cysts without removing too much tissue to avoid scar tissue formation, which could lead to strictures and more pain.    Anal skin tag [K64.4]          Patient verbalized understanding, all questions were answered, and were agreeable with the plan outlined above.   Carolan Shiver, MD  Electronically signed by Carolan Shiver, MD

## 2024-02-18 NOTE — H&P (View-Only) (Signed)
 History of Present Illness Sergio Callahan is a 73 year old male who presents for evaluation of removal of anal tags and examination under anesthesia.  He has had anal tags for several years, which have recently become more uncomfortable. The tags are larger and cause discomfort and pain, particularly during prolonged sitting or using the bathroom. He experiences occasional irritation and minor bleeding from the tags.  He was previously evaluated on January 07, 2024, but the surgery was not performed due to the placement of a stent on his left knee. He is currently on both Plavix and aspirin. There was a discussion about the necessity of continuing these medications due to the recent stent placement.  No new discomfort, bleeding, or pain since the last evaluation.      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    Hx of adenomatous colonic polyps    Hyperlipidemia    Hypertension    Hyponatremia    Lupus    Monoclonal gammopathy    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/2004   Substance abuse (CMS/HHS-HCC)    cigarette        PAST SURGICAL HISTORY:   Past Surgical History:  Procedure Laterality Date   COLONOSCOPY  06/18/2005   Adenomatous Polyps, FH Colon Polyps (Father)   cardiac catheterization  2012   EGD  10/19/2011   08/13/2008, 06/18/2005; No repeat per RTE   COLONOSCOPY  01/01/2014   08/13/2008; PH Adenomatous Polyps, FHCC (Father), FH Colon Polyps (Father): [facility]  08/2019: Recall ltr mailed    lumbar laminectomy/decompression microdiscectomy Left 02/03/2015   ANGIOGRAPHY PELVIC  05/02/2018   Limited arthroscopic debridement arthroscopic subacromial decompression mini-open rotator cuff repair using a smith & nepew regeneten patch and mini-open biceps tenodesis,right shoulder  Right 12/04/2018   Dr.poggi    COLONOSCOPY  09/30/2019   Normal Colon/PHx CP/Repeat 31yrs/JWB   nasal septoplasty with turbinoplasty Bilateral 10/23/2019   LIFT / REPAIR BROW PTOSIS FOREHEAD Bilateral 01/27/2021   COLONOSCOPY  05/05/2021   Tubular adenomas/Hyperplastic polyps/Repeat 82yrs/CTL   EGD  05/05/2021   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  No atypia.   EGD  08/17/2022   fundic gland polyps/Normal EGD biopsy/Repeat 49yrs/CTL   CORONARY ANGIOPLASTY     ENDOSCOPIC CARPAL TUNNEL RELEASE Right    Left wrist fracture repair Left    MOHS SURGERY     Dr. Lorn Junes         MEDICATIONS:  Outpatient Encounter Medications as of 02/18/2024  Medication Sig Dispense Refill   ALPRAZolam (XANAX) 0.5 MG tablet TAKE 1 TABLET (0.5 MG TOTAL) BY MOUTH 3 (THREE) TIMES DAILY AS NEEDED FOR ANXIETY. 90 tablet 4  amLODIPine (NORVASC) 2.5 MG tablet Take 1 tablet (2.5 mg total) by mouth once daily 90 tablet 3   aspirin 81 MG EC tablet Take 81 mg by mouth once daily.     azaTHIOprine (IMURAN) 50 mg tablet TAKE 2 AND 1/2 TABLETS BY MOUTH ONCE DAILY 225 tablet 1   BD INTEGRA SYRINGE 3 mL 25 gauge x 1" Syrg Inject 1 each into the muscle every 14 (fourteen) days 12 Syringe 0   cetirizine (ZYRTEC) 10 MG tablet Take 10 mg by mouth once daily as needed     clopidogreL (PLAVIX) 75 mg tablet Take 75 mg by mouth once daily     cyanocobalamin (VITAMIN B12) 1,000 mcg/mL injection INJECT 1 ML  (1,000 MCG TOTAL) INTO THE MUSCLE EVERY 14 (FOURTEEN) DAYS 6 mL 0   finasteride (PROSCAR) 5 mg tablet TAKE 1 TABLET BY MOUTH ONCE DAILY 90 tablet 1   fluticasone propionate (FLONASE) 50 mcg/actuation nasal spray Place 1 spray into both nostrils 2 (two) times daily 16 g 0   hydroCHLOROthiazide (HYDRODIURIL) 12.5 MG tablet TAKE 1 TABLET BY MOUTH EVERY DAY 90 tablet 1   hydroxychloroquine (PLAQUENIL) 200 mg tablet Take 1 tablet (200 mg total) by mouth 2 (two) times daily 180 tablet 1   ibuprofen (ADVIL,MOTRIN) 600 MG tablet Take 600 mg by mouth.     lisinopriL (ZESTRIL) 40 MG tablet TAKE 1 TABLET BY MOUTH EVERY DAY 90 tablet 3   metoprolol succinate (TOPROL-XL) 50 MG XL tablet Take 1 tablet (50 mg total) by mouth once daily 90 tablet 3   omeprazole (PRILOSEC) 20 MG DR capsule Take 20 mg by mouth once daily.     pantoprazole (PROTONIX) 20 MG DR tablet Take 20 mg by mouth once daily     pravastatin (PRAVACHOL) 40 MG tablet TALE 1 TABLET BY MOUTH AT BEDTIME 90 tablet 2   promethazine-dextromethorphan (PROMETHAZINE-DM) 6.25-15 mg/5 mL syrup Take 5 mLs by mouth every 6 (six) hours as needed 120 mL 0   sildenafil (REVATIO) 20 mg tablet Take 20 mg by mouth as needed     spironolactone (ALDACTONE) 25 MG tablet Take 25 mg by mouth once daily     tadalafiL (CIALIS) 20 MG tablet Take 20 mg by mouth once daily as needed     tamsulosin (FLOMAX) 0.4 mg capsule TAKE 1 CAP BY MOUTH ONCE DAILY 90 capsule 2   No facility-administered encounter medications on file as of 02/18/2024.     ALLERGIES:   Fosamax [alendronate] and Percocet [oxycodone-acetaminophen]   SOCIAL HISTORY:  Social History   Socioeconomic History   Marital status: Married  Occupational History   Occupation: Retired  Tobacco Use   Smoking status: Former    Current packs/day: 0.00    Average packs/day: 1.5 packs/day for 30.0 years (45.0 ttl pk-yrs)    Types: Cigarettes    Start date: 04/01/1991    Quit date: 03/31/2021    Years since  quitting: 2.8    Passive exposure: Past   Smokeless tobacco: Never  Vaping Use   Vaping status: Never Used  Substance and Sexual Activity   Alcohol use: Yes    Alcohol/week: 0.0 standard drinks of alcohol    Comment: occasionally   Drug use: No   Sexual activity: Defer   Social Drivers of Health   Financial Resource Strain: Low Risk  (02/17/2024)   Overall Financial Resource Strain (CARDIA)    Difficulty of Paying Living Expenses: Not hard at all  Food Insecurity: No Food Insecurity (  02/17/2024)   Hunger Vital Sign    Worried About Running Out of Food in the Last Year: Never true    Ran Out of Food in the Last Year: Never true  Transportation Needs: No Transportation Needs (02/17/2024)   PRAPARE - Administrator, Civil Service (Medical): No    Lack of Transportation (Non-Medical): No    FAMILY HISTORY:  Family History  Problem Relation Name Age of Onset   Cancer Mother     Stroke Mother     Cataracts Mother     Leukemia Mother     High blood pressure (Hypertension) Father     Hyperlipidemia (Elevated cholesterol) Father     Colon polyps Father     Diabetes Father     Cancer Father     Cataracts Father     Colon cancer Father     Hearing loss Father     Breast cancer Neg Hx       GENERAL REVIEW OF SYSTEMS:   General ROS: negative for - chills, fatigue, fever, weight gain or weight loss Allergy and Immunology ROS: negative for - hives  Hematological and Lymphatic ROS: negative for - bleeding problems or bruising, negative for palpable nodes Endocrine ROS: negative for - heat or cold intolerance, hair changes Respiratory ROS: negative for - cough, shortness of breath or wheezing Cardiovascular ROS: no chest pain or palpitations GI ROS: negative for nausea, vomiting, abdominal pain, diarrhea, constipation Musculoskeletal ROS: negative for - joint swelling or muscle pain Neurological ROS: negative for - confusion, syncope Dermatological ROS: negative for  pruritus and rash  PHYSICAL EXAM:  Vitals:   02/18/24 0954  BP: (!) 138/90  Pulse: 83  .  Ht:182.9 cm (6') Wt:88.5 kg (195 lb) AOZ:HYQM surface area is 2.12 meters squared. Body mass index is 26.45 kg/m.Marland Kitchen   GENERAL: Alert, active, oriented x3  HEENT: Pupils equal reactive to light. Extraocular movements are intact. Sclera clear. Palpebral conjunctiva normal red color.Pharynx clear.  NECK: Supple with no palpable mass and no adenopathy.  LUNGS: Sound clear with no rales rhonchi or wheezes.  HEART: Regular rhythm S1 and S2 without murmur.  RECTAL: Multiple large polypoid anal tags there is a lot of skin cyst surrounding the anal area.   EXTREMITIES: Well-developed well-nourished symmetrical with no dependent edema.  NEUROLOGICAL: Awake alert oriented, facial expression symmetrical, moving all extremities.   Assessment & Plan Anal skin tags   Anal skin tags have been present for years, causing discomfort, irritation, and minor bleeding, especially during prolonged sitting or defecation. Remove the tags that bleed and cause discomfort. The procedure can be performed while he is on Plavix and aspirin, as the tags are not too large and good control can be maintained. Send the removed tags to pathology to ensure there is no underlying pathology.  Cysts   Multiple small cysts in the anal area are present but not causing significant discomfort. Clean out some of the cysts without removing too much tissue to avoid scar tissue formation, which could lead to strictures and more pain.    Anal skin tag [K64.4]          Patient verbalized understanding, all questions were answered, and were agreeable with the plan outlined above.   Carolan Shiver, MD  Electronically signed by Carolan Shiver, MD

## 2024-02-19 ENCOUNTER — Ambulatory Visit: Payer: Medicare Other | Admitting: Urology

## 2024-02-19 ENCOUNTER — Telehealth: Payer: Self-pay

## 2024-02-19 NOTE — Telephone Encounter (Signed)
 Spoke to pt and pt's wife. She states that pt is scheduled to see urology on 4/14 and will further discuss with at visit.

## 2024-02-19 NOTE — Telephone Encounter (Signed)
-----   Message from Rickard Patience sent at 02/18/2024 11:04 PM EDT ----- Please let patient know CT and MRI findings are concerning for early stage kidney cancer, please refer him to urology for evaluation.

## 2024-02-20 ENCOUNTER — Ambulatory Visit: Payer: Self-pay | Admitting: Urology

## 2024-02-25 ENCOUNTER — Ambulatory Visit (INDEPENDENT_AMBULATORY_CARE_PROVIDER_SITE_OTHER): Payer: PRIVATE HEALTH INSURANCE | Admitting: Urology

## 2024-02-25 ENCOUNTER — Ambulatory Visit: Payer: PRIVATE HEALTH INSURANCE | Admitting: Urology

## 2024-02-25 ENCOUNTER — Encounter: Payer: Self-pay | Admitting: Urology

## 2024-02-25 VITALS — BP 118/84 | Ht 72.0 in | Wt 194.0 lb

## 2024-02-25 DIAGNOSIS — N138 Other obstructive and reflux uropathy: Secondary | ICD-10-CM

## 2024-02-25 DIAGNOSIS — Z125 Encounter for screening for malignant neoplasm of prostate: Secondary | ICD-10-CM | POA: Diagnosis not present

## 2024-02-25 DIAGNOSIS — N2889 Other specified disorders of kidney and ureter: Secondary | ICD-10-CM

## 2024-02-25 DIAGNOSIS — N401 Enlarged prostate with lower urinary tract symptoms: Secondary | ICD-10-CM | POA: Diagnosis not present

## 2024-02-25 MED ORDER — TAMSULOSIN HCL 0.4 MG PO CAPS: 0.4000 mg | ORAL_CAPSULE | Freq: Every day | ORAL | 11 refills | Status: DC

## 2024-02-25 MED ORDER — FINASTERIDE 5 MG PO TABS: 5.0000 mg | ORAL_TABLET | Freq: Every day | ORAL | 3 refills | Status: DC

## 2024-02-25 NOTE — Progress Notes (Signed)
   02/25/2024 10:44 AM   Sergio Callahan [birth year] [id number]  Reason for visit: New left renal mass, history of BPH, microscopic hematuria, penile warts, ED, PSA screening  HPI: 73 year old male I have followed for the above issues-history of negative microscopic hematuria workup in 2022 with normal renal ultrasound and cystoscopy.  PSA has been normal, most recently 0.37, corrected for finasteride 0.74 in February 2025.  He is on maximal medical therapy with Flomax and finasteride.  Using Cialis 20 mg on demand for ED.  History of penile warts, been treated with dermatology and [facility].  He denies any major changes in urination over the last year, PVR today normal at 0ml.  He consumes a fair amount of tea, diet drinks, and flavored drinks which likely contribute to his urinary frequency.  Behavioral strategies, avoiding bladder irritants were reviewed at length.  Flomax and finasteride refilled.  In terms of ED, not sexually active as frequently, but Cialis 20 mg on demand works well.  He does not need a refill at this time.  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  A recent MRI of the abdomen from 02/18/2024 showed a new 1.3 cm exophytic left posterior renal lesion concerning for RCC.  I personally viewed and interpreted those images.  We discussed treatment options for small renal masses including active surveillance, biopsy, percutaneous ablation, partial nephrectomy.  Renal function is normal.  Using shared decision making he was in agreement to start with active surveillance.  We reviewed a less than 5% chance of developing metastatic disease or symptoms over the next 15 years during active surveillance for masses smaller than 4 cm.  Flomax and finasteride refilled, continue Cialis RTC 6 months MRI abdomen prior for active surveillance of small left renal mass  I spent 45 total minutes on the day  of the encounter including pre-visit review of the medical record, face-to-face time with the patient, and post visit ordering of labs/imaging/tests.  Extensive review of outside oncology notes and multiple imaging tests, new diagnosis of renal mass.   Lawerence Pressman, MD  [facility] [address] [place], [place] [address] [phone]

## 2024-02-25 NOTE — Patient Instructions (Addendum)
 A Growth in the Kidney (Renal Mass): What to Know  A renal mass is an abnormal growth in the kidney. It may be found during an MRI, CT scan, or ultrasound that's done to check for other problems in the belly. Some renal masses are cancerous, or malignant, and can grow or spread quickly. Others are benign, which means they're not cancer. Renal masses include: Tumors. These may be either cancerous or benign. The most common cancerous tumor in adults is called renal cell carcinoma. In children, the most common type is Wilms tumor. The most common kidney tumors that aren't cancer include renal adenomas, oncocytomas, and angiomyolipomas (AML). Cysts. These are pockets of fluid that form on or in the kidney. What are the causes? Certain types of cancers, infections, or injuries can cause a renal mass. It's not always known what causes a cyst to form in or on the kidney. What are the signs or symptoms? Often, a renal mass or kidney cyst doesn't cause any signs or symptoms. How is this diagnosed? Your health care provider may suggest tests to diagnose the cause of your renal mass. These tests may include: A physical exam. Blood tests. Pee (urine) tests. Imaging tests, such as: CT scan. MRI. Ultrasound. Chest X-ray or bone scan. These may be done if a tumor is cancerous to see if the cancer has spread outside the kidney. Biopsy. This is when a small piece of tissue is removed from the renal mass for testing. How is this treated? Treatment is not always needed for a renal mass. Treatment will depend on the cause of the mass and if it's causing any problems or symptoms. Many small lesions <4cm can be safely monitored with imaging every 6-12 months, and only need treatment if significant growth is seen. For a cancerous renal mass, treatment options may include: Surgery. This is done to remove the tumor and any affected tissue. Chemotherapy. This uses medicines to kill cancer cells. Radiation.  High-energy X-rays or gamma rays are used to kill cancer cells. Ablation. This uses extreme hot or cold temperature to kill the cancer cells. Immunotherapy. Medicines are used to help the body's defense system (immune system) fight the cancer cells. Taking part in clinical trials. This involves trying new or experimental treatments to see if they're effective. Most kidney cysts don't need to be treated. Follow these instructions at home: What you need to do at home will depend on the cause of the mass. Follow the instructions that your provider gives you. In general: Take medicines only as told. If you were given antibiotics, take them as told. Do not stop taking them even if you start to feel better. Follow any instructions from your provider about what you can and can't do. Do not smoke, vape, or use nicotine or tobacco. Keep all follow-up visits. Your provider will need to check if your renal mass has changed or grown. Contact a health care provider if: You have flank pain, which is pain in your side or back. You have a fever. You have a loss of appetite. You have pain or swelling in your belly. You lose weight. Get help right away if: Your pain gets worse. There's blood in your pee. You can't pee. This information is not intended to replace advice given to you by your health care provider. Make sure you discuss any questions you have with your health care provider. Document Revised: 04/26/2023 Document Reviewed: 04/26/2023 Elsevier Patient Education  2024 [employer].

## 2024-02-26 ENCOUNTER — Other Ambulatory Visit: Payer: Self-pay

## 2024-02-26 ENCOUNTER — Encounter
Admission: RE | Admit: 2024-02-26 | Discharge: 2024-02-26 | Disposition: A | Discharge status: Home / Self Care | Payer: PRIVATE HEALTH INSURANCE | Source: Ambulatory Visit | Attending: General Surgery | Admitting: General Surgery

## 2024-02-26 NOTE — Patient Instructions (Addendum)
 Your procedure is scheduled on: 03/06/24 - Friday Report to the Registration Desk on the 1st floor of the [facility]. To find out your arrival time, please call [phone] between 1PM - 3PM on: 03/05/24 - Thursday If your arrival time is 6:00 am, do not arrive before that time as the [facility] entrance doors do not open until 6:00 am.  REMEMBER: Instructions that are not followed completely may result in serious medical risk, up to and including death; or upon the discretion of your surgeon and anesthesiologist your surgery may need to be rescheduled.  Do not eat food or drink any liquids after midnight the night before surgery.  No gum chewing or hard candies.  One week prior to surgery beginning 04/18, : Stop Anti-inflammatories (NSAIDS) such as Advil, Aleve, Ibuprofen, Motrin, Naproxen, Naprosyn and Aspirin based products such as Excedrin, Goody's Powder, BC Powder. You may take Tylenol if needed for pain up until the day of surgery.  Stop beginning 04/18, ANY OVER THE COUNTER supplements until after surgery :ELDERBERRY,    HOLD tadalafil (CIALIS) 2 days prior to surgery beginning 03/04/24.  Continue your Plavix and Aspirin as scheduled including the day of surgery.  ON THE DAY OF SURGERY ONLY TAKE THESE MEDICATIONS WITH SIPS OF WATER:  amLODipine (NORVASC)  azaTHIOprine (IMURAN ) finasteride (PROSCAR)  hydroxychloroquine  pantoprazole (PROTONIX)  tamsulosin (FLOMAX)  ALPRAZolam (XANAX) if needed   No Alcohol for 24 hours before or after surgery.  No Smoking including e-cigarettes for 24 hours before surgery.  No chewable tobacco products for at least 6 hours before surgery.  No nicotine patches on the day of surgery.  Do not use any "recreational" drugs for at least a week (preferably 2 weeks) before your surgery.  Please be advised that the combination of cocaine and anesthesia may have negative outcomes, up to and including death. If you test positive for  cocaine, your surgery will be cancelled.  On the morning of surgery brush your teeth with toothpaste and water, you may rinse your mouth with mouthwash if you wish. Do not swallow any toothpaste or mouthwash.  Use CHG Soap or wipes as directed on instruction sheet.  Do not wear jewelry, make-up, hairpins, clips or nail polish.  For welded (permanent) jewelry: bracelets, anklets, waist bands, etc.  Please have this removed prior to surgery.  If it is not removed, there is a chance that hospital personnel will need to cut it off on the day of surgery.  Do not wear lotions, powders, or perfumes.   Do not shave body hair from the neck down 48 hours before surgery.  Contact lenses, hearing aids and dentures may not be worn into surgery.  Do not bring valuables to the hospital. [facility] is not responsible for any missing/lost belongings or valuables.   Notify your doctor if there is any change in your medical condition (cold, fever, infection).  Wear comfortable clothing (specific to your surgery type) to the hospital.  After surgery, you can help prevent lung complications by doing breathing exercises.  Take deep breaths and cough every 1-2 hours. Your doctor may order a device called an Incentive Spirometer to help you take deep breaths. When coughing or sneezing, hold a pillow firmly against your incision with both hands. This is called "splinting." Doing this helps protect your incision. It also decreases belly discomfort.  If you are being admitted to the hospital overnight, leave your suitcase in the car. After surgery it may be brought  to your room.  In case of increased patient census, it may be necessary for you, the patient, to continue your postoperative care in the Same Day Surgery department.  If you are being discharged the day of surgery, you will not be allowed to drive home. You will need a responsible individual to drive you home and stay with you for 24 hours after  surgery.   If you are taking public transportation, you will need to have a responsible individual with you.  Please call the Pre-admissions Testing Dept. at [phone] if you have any questions about these instructions.  Surgery Visitation Policy:  Patients having surgery or a procedure may have two visitors.  Children under the age of 50 must have an adult with them who is not the patient.  Inpatient Visitation:    Visiting hours are 7 a.m. to 8 p.m. Up to four visitors are allowed at one time in a patient room. The visitors may rotate out with other people during the day.  One visitor age 31 or older may stay with the patient overnight and must be in the room by 8 p.m.

## 2024-03-04 ENCOUNTER — Encounter: Payer: Self-pay | Admitting: General Surgery

## 2024-03-04 NOTE — Progress Notes (Signed)
 Perioperative / Anesthesia Services  Pre-Admission Testing Clinical Review / Pre-Operative Anesthesia Consult  Date: 03/04/24  Patient Demographics:  Name: Sergio Callahan DOB: [birth year] MRN:   [id number]  Planned Surgical Procedure(s):    Case: [id number] Date/Time: 03/06/24 1027   Procedures:      EXAM UNDER ANESTHESIA, RECTUM (Rectum)     EXCISION, SKIN TAG (Rectum)   Anesthesia type: General   Pre-op diagnosis: K64.4 anal skin tag   Location: [facility] OR ROOM 03 / [facility]   Surgeons: [name], MD     NOTE: Available PAT nursing documentation and vital signs have been reviewed. Clinical nursing staff has updated patient's PMH/PSHx, current medication list, and drug allergies/intolerances to ensure comprehensive history available to assist in medical decision making as it pertains to the aforementioned surgical procedure and anticipated anesthetic course. Extensive review of available clinical information personally performed. [place] PMH and PSHx updated with any diagnoses/procedures that  may have been inadvertently omitted during his intake with the pre-admission testing department's nursing staff.  Clinical Discussion:  Sergio Callahan is a 73 y.o. male who is submitted for pre-surgical anesthesia review and clearance prior to him undergoing the above procedure. Patient is a Current Smoker (45 pack years). 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   Patient is followed by cardiology Sergio Bound, MD). He was last seen in the cardiology clinic on 11/04/2023; notes  reviewed. At the time of his clinic visit, patient doing well overall from a cardiovascular perspective.  Patient with episodes of generalized weakness, increased fatigue, and episodes of hypotension.  Patient denied any chest pain, shortness of breath, PND, orthopnea, palpitations, significant peripheral edema, vertiginous symptoms, or presyncope/syncope. Patient with a past medical history significant for cardiovascular diagnoses. Documented physical exam was grossly benign, providing no evidence of acute exacerbation and/or decompensation of the patient's known cardiovascular conditions.  Patient underwent diagnostic LEFT heart catheterization on 12/08/2010 oh at [facility].  Study revealed significant single-vessel CAD, with a 99% lesion in the proximal LAD.  Rotational atherectomy was performed followed by PCI placing a 3.0 x 23 mm Xience DES x 1 to the proximal LAD lesion yielding excellent angiographic result and TIMI-3 flow.  Most recent myocardial perfusion imaging study was performed on 02/14/2018 revealing a normal left trickle systolic function with an EF of 54%.  There were no regional wall motion abnormalities.  SPECT images demonstrated no evidence of stress-induced myocardial ischemia or arrhythmia; no scintigraphic evidence of scar.  Left ventricular internal cavity size was normal throughout the study.  Study determined to be normal and low risk overall.  Patient remains on daily DAPT therapy using ASA + clopidogrel .  Patient reported compliant with therapy with no evidence reports of GI/GU related bleeding. Blood pressure well controlled at 124/72 mmHg on currently prescribed CCB (amlodipine), diuretic (HCTZ + spironolactone), and beta-blocker (metoprolol  succinate) therapies.  Patient is on pravastatin  for his HLD diagnosis and ASCVD prevention.  Patient has a supply of short acting nitrates (NTG) to use on a as needed basis for recurrent angina/anginal equivalent  symptoms; denied recent use. In the setting of known cardiovascular diagnoses, it is important note that patient  is on a PDE5i medication (tadalafil ) for and erectile dysfunction diagnosis. Patient is not diabetic. He does not have an OSAH diagnosis. Patient is able to complete all of his  ADL/IADLs without cardiovascular limitation.  Per the DASI, patient is able to achieve at least 4 METS of physical activity without experiencing any significant degree of angina/anginal equivalent symptoms.  Given his reported hypotension, antihypertensive medications were decreased.  No other changes were made to his medication regimen during this visit with cardiology. Patient scheduled to follow-up with outpatient cardiology in 1 year or sooner if needed.  Sergio Callahan schedule for a  EXAM UNDER ANESTHESIA, RECTUM (Rectum); EXCISION, SKIN TAG (Rectum) on 03/06/2024 with Dr. [name], MD. Given patient's past medical history significant for cardiovascular diagnoses, presurgical cardiac clearance was sought by the PAT team. Per cardiology, Per cardiology, "this patient is optimized for surgery and may proceed with the planned procedural course with a LOW risk of significant perioperative cardiovascular complications".  In review of the patient's chart, it is noted that he is on daily oral dual antithrombotic therapy.  Given that patient's past medical history is significant for cardiovascular diagnoses, including but not limited to CAD, general surgery has cleared patient to continue his daily DAPT medications throughout his perioperative course. Patient has been updated on these directives from his specialty care providers by the PAT team.  Patient denies previous perioperative complications with anesthesia in the past. In review his EMR, it is noted that patient underwent a general anesthetic course here at [facility] ([facility]A III) in 12/2023 without documented complications.       02/25/2024   10:02 AM 02/03/2024    9:46 AM 01/22/2024    9:56 AM  Vitals with BMI  Height 6\' 0"   6\' 0"   Weight 194 lbs 194 lbs 6 oz 193 lbs 3 oz  BMI 26.31 26.36 26.2  Systolic 118 124 161  Diastolic 84 84 80  Pulse  89 87   Providers/Specialists:  NOTE: Primary physician provider listed below. Patient may have been seen by APP or partner within same practice.   PROVIDER ROLE / SPECIALTY LAST Abundio Hoit, MD General Surgery (Surgeon) 02/18/2024  Little Riff, MD Primary Care Provider 12/30/2023  Thais Fill, MD Cardiology 11/04/2023  Karole Pacer, MD Rheumatology 01/06/2024  Earvin Goldberg, MD Physiatry 12/30/2023  Vergia Glasgow, MD Pulmonary Medicine 01/22/2024  Devon Fogo, MD Vascular Surgery 02/03/2024  Timmy Forbes, MD Medical Oncology/Hematology 01/21/2024   Allergies:   Allergies  Allergen Reactions   Fosamax [Alendronate Sodium]     Leg pain   Percocet [Oxycodone -Acetaminophen ] Nausea And Vomiting   Current Home Medications:   No current facility-administered medications for this encounter.    ALPRAZolam  (XANAX ) 0.5 MG tablet   amLODipine (NORVASC) 2.5 MG tablet   aspirin  EC 81 MG tablet   azaTHIOprine (IMURAN) 50 MG tablet   cetirizine (ZYRTEC) 10 MG tablet   clopidogrel  (PLAVIX ) 75 MG tablet   cyanocobalamin  (,VITAMIN B-12,) 1000 MCG/ML injection   ELDERBERRY PO   finasteride  (PROSCAR ) 5 MG tablet   hydrochlorothiazide  (HYDRODIURIL ) 12.5 MG tablet   hydroxychloroquine (PLAQUENIL) 200 MG tablet   ibuprofen (ADVIL,MOTRIN) 200 MG tablet   metoprolol  succinate (TOPROL -XL) 50 MG 24 hr tablet   nicotine (NICODERM CQ - DOSED IN MG/24 HOURS) 21 mg/24hr patch   nitroGLYCERIN (NITROLINGUAL) 0.4 MG/SPRAY spray   pantoprazole  (PROTONIX ) 20 MG tablet   pravastatin  (PRAVACHOL ) 40 MG tablet   Probiotic Product (PROBIOTIC  DAILY PO)   spironolactone (ALDACTONE) 25 MG tablet   tadalafil  (CIALIS ) 20 MG tablet   tamsulosin  (FLOMAX ) 0.4  MG CAPS capsule   triamcinolone (NASACORT) 55 MCG/ACT AERO nasal inhaler   History:   Past Medical History:  Diagnosis Date   AAA (abdominal aortic aneurysm) (HCC)    Abdominal aortic aneurysm without rupture (HCC)    Adenomatous colon polyp    Allergic rhinitis    Anemia    Anogenital (venereal) warts    Anxiety    a.) on BZO PRN (alprazolam )   Aortic atherosclerosis (HCC)    Aortic atherosclerosis (HCC)    Arthritis    Atherosclerosis of native artery of right lower extremity with intermittent claudication (HCC)    B12 deficiency    Back pain    Bilateral adrenal adenomas    BPH (benign prostatic hyperplasia)    Carpal tunnel syndrome    Celiac artery atherosclerosis    Coronary artery disease    a.) MV 02/26/2005: no ischemia; b.) MV 11/24/2010: mild inferosep ischemia; c.) LHC/PCI 12/08/2010: 99% pLAD --> rotational athrectomy + 3.0 x 23 mm Xience DES x 1; d.) MV 02/15/2011: no ischemia; e.) MV 02/14/2018: no ischemia   Degenerative joint disease    DOE (dyspnea on exertion)    Duodenitis    Elevated liver enzymes 05/2023   Erectile dysfunction    a.) on PDE5i (tadalafil )   GERD (gastroesophageal reflux disease)    Hepatic steatosis    History of shingles    History of tobacco abuse    HPV in male    Hx of adenomatous colonic polyps    Hyperlipidemia    Hypertension    Hyponatremia    Iliac artery aneurysm (HCC)    Long term (current) use of aspirin     Long term current use of immunosuppressive drug    a.) Tx'd with azathioprine + hydroxychloroquine for SLE   Lumbar stenosis    MGUS (monoclonal gammopathy of unknown significance)    Multiple pulmonary nodules determined by computed tomography of lung    Neuroendocrine carcinoma of stomach (HCC) 2023   Nocturia    Nodule of lower lobe of right lung 06/2023   Paresthesia of left lower extremity    Peripheral vascular disease (HCC)    Renal mass, right 2025   Right lower lobe pulmonary nodule 06/20/2021   a.)  Dotatate PET 06/20/2021: posterior RLL 0.6 x 0.5 mm: b.) LDCT chest 07/27/2022: 0.8 x 0.6 mm; c.) Dotatate PET 02/04/2023: 0.7 x 0.7 mm; d.) CT chest 05/13/2023: 8 x 11 mm; e.) super D chest CT 07/17/2023: spiclated morphology 9 x 8 mm; f.) CT chest 12/09/2023: cavitary spiculated nodule 10 x 7 mm   Rotator cuff tendonitis    Shingles    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)    a.) Tx'd with azathioprine + hydroxychloroquine   Urinary frequency    Past Surgical History:  Procedure Laterality Date   BACK SURGERY     BIOPSY  08/28/2021   Procedure: BIOPSY;  Surgeon: Normie Becton., MD;  Location: [facility];  Service: Gastroenterology;;   BRONCHIAL BIOPSY  07/18/2023   Procedure: BRONCHIAL BIOPSIES;  Surgeon: Vergia Glasgow, MD;  Location: [facility];  Service: Pulmonary;;   BRONCHIAL BIOPSY Right    BRONCHIAL BIOPSY Right 12/31/2023   BRONCHIAL NEEDLE ASPIRATION BIOPSY  07/18/2023   Procedure: BRONCHIAL NEEDLE ASPIRATION BIOPSIES;  Surgeon: Vergia Glasgow, MD;  Location: [facility];  Service: Pulmonary;;   BRONCHIAL WASHINGS  07/18/2023   Procedure: BRONCHIAL WASHINGS;  Surgeon: Vergia Glasgow, MD;  Location[facility] END[facility]COPY;  Service: Pulmonary;;   BROW LIFT Bilateral 01/27/2021   Procedure: BLEPHAROPLASTY UPPER EYELID; W/EXCESS SKIN BROW PTOSIS REPAIR AND BLEPHAROPTOSIS REPAIR; RESECT EX BILATERAL;  Surgeon: Zacarias Hermann, MD;  Location: [facility];  Service: Ophthalmology;  Laterality: Bilateral;   COLONOSCOPY     COLONOSCOPY WITH PROPOFOL  N/A 09/30/2019   Procedure: COLONOSCOPY WITH PROPOFOL ;  Surgeon: Marshall Skeeter, MD;  Location: [facility] ENDOSCOPY;  Service: Endoscopy;  Laterality: N/A;   COLONOSCOPY WITH PROPOFOL  N/A 05/05/2021   Procedure: COLONOSCOPY WITH PROPOFOL ;  Surgeon: Shane Darling, MD;  Location: [facility] ENDOSCOPY;  Service: Endoscopy;  Laterality: N/A;   CORONARY ANGIOPLASTY WITH STENT PLACEMENT Left 12/08/2010   Procedure:  CORONARY ANGIOPLASTY WITH STENT PLACEMENT (pLAD); Procedure: [facility]   ESOPHAGOGASTRODUODENOSCOPY     ESOPHAGOGASTRODUODENOSCOPY (EGD) WITH PROPOFOL  N/A 05/05/2021   Procedure: ESOPHAGOGASTRODUODENOSCOPY (EGD) WITH PROPOFOL ;  Surgeon: Shane Darling, MD;  Location: [facility] ENDOSCOPY;  Service: Endoscopy;  Laterality: N/A;   ESOPHAGOGASTRODUODENOSCOPY (EGD) WITH PROPOFOL  N/A 08/28/2021   Procedure: ESOPHAGOGASTRODUODENOSCOPY (EGD) WITH PROPOFOL ;  Surgeon: Brice Campi Albino Alu., MD;  Location: [facility] ENDOSCOPY;  Service: Gastroenterology;  Laterality: N/A;   ESOPHAGOGASTRODUODENOSCOPY (EGD) WITH PROPOFOL  N/A 08/17/2022   Procedure: ESOPHAGOGASTRODUODENOSCOPY (EGD) WITH PROPOFOL ;  Surgeon: Shane Darling, MD;  Location: [facility] ENDOSCOPY;  Service: Endoscopy;  Laterality: N/A;   EUS N/A 08/28/2021   Procedure: UPPER ENDOSCOPIC ULTRASOUND (EUS) RADIAL;  Surgeon: Normie Becton., MD;  Location: [facility] ENDOSCOPY;  Service: Gastroenterology;  Laterality: N/A;   LOWER EXTREMITY ANGIOGRAPHY Left 01/10/2024   Procedure: Lower Extremity Angiography;  Surgeon: Jackquelyn Mass, MD;  Location: [facility] INVASIVE CV LAB;  Service: Cardiovascular;  Laterality: Left;   LUMBAR LAMINECTOMY/DECOMPRESSION MICRODISCECTOMY Left 02/03/2015   Procedure: LUMBAR LAMINECTOMY/DECOMPRESSION MICRODISCECTOMY LEFT LUMBAR FOUR-FIVE WITH REMOVAL OF SYNOVIAL CYST;  Surgeon: Garry Kansas, MD;  Location: [facility]O [facility];  Service: Neurosurgery;  Laterality: Left;   NASAL SEPTOPLASTY W/ TURBINOPLASTY Bilateral 10/23/2019   Procedure: NASAL SEPTOPLASTY WITH SUBMUCOUS RESECTION OF TURBINATE;  Surgeon: Lesly Raspberry, MD;  Location: [facility];  Service: ENT;  Laterality: Bilateral;   PELVIC ANGIOGRAPHY N/A 05/02/2018   Procedure: PELVIC ANGIOGRAPHY;  Surgeon: Jackquelyn Mass, MD;  Location: [facility] INVAS[facility] CV LAB;  Service: Cardiovascular;  Laterality: N/A;   POLYPECTOMY  08/28/2021   Procedure: POLYPECTOMY;  Surgeon:  Brice Campi Albino Alu., MD;  Location: [facility];  Service: Gastroenterology;;   SHOULDER ARTHROSCOPY WITH SUBACROMIAL DECOMPRESSION AND BICEP TENDON REPAIR Right 12/04/2018   Procedure: SHOULDER ARTHROSCOPY WITH DEBRIDEMENT, DECOMPRESSION, BICEP TENODESIS;  Surgeon: Elner Hahn, MD;  Location: [facility] ORS;  Service: Orthopedics;  Laterality: Right;   SUBMUCOSAL INJECTION  08/28/2021   Procedure: SUBMUCOSAL INJECTION;  Surgeon: Brice Campi Albino Alu., MD;  Location: [facility];  Service: Gastroenterology;;   Family History  Problem Relation Age of Onset   Leukemia Mother        dx 38s   Hyperlipidemia Father    Hypertension Father    Diabetes Father    Colon cancer Father        dx 24s   Hearing loss Father    Vascular Disease Father    Lung cancer Brother        d. 10   Social History   Tobacco Use   Smoking status: Former    Current packs/day: 0.00    Average packs/day: 1.5 packs/day for 30.0 years (45.0 ttl pk-yrs)    Types:  Cigarettes    Start date: 04/01/1991    Quit date: 03/31/2021    Years since quitting: 2.9   Smokeless tobacco: Never  Substance Use Topics   Alcohol use: Yes    Alcohol/week: 14.0 standard drinks of alcohol    Types: 14 Standard drinks or equivalent per week    Comment: cocktails about 2 daily   Pertinent Clinical Results:  LABS:  Lab Results  Component Value Date   WBC 5.1 01/14/2024   HGB 15.3 01/14/2024   HCT 44.3 01/14/2024   MCV 94.9 01/14/2024   PLT 235 01/14/2024   Lab Results  Component Value Date   NA 133 (L) 01/14/2024   CL 99 01/14/2024   K 3.9 01/14/2024   CO2 26 01/14/2024   BUN 14 01/14/2024   CREATININE 0.82 01/14/2024   GFRNONAA >60 01/14/2024   CALCIUM 8.9 01/14/2024   ALBUMIN 3.7 01/14/2024   GLUCOSE 100 (H) 01/14/2024    ECG: Date: 12/26/2023  Time ECG obtained: 0934 AM Rate: 80 bpm Rhythm:  Sinus rhythm with sinus arrhythmia Axis (leads I and aVF): normal Intervals: PR 170 ms. QRS 74 ms. QTc 447  ms. ST segment and T wave changes: No evidence of acute T wave abnormalities or significant ST segment elevation or depression.  Evidence of a possible, age undetermined, prior infarct:  No Comparison: Similar to previous tracing obtained on 04/22/2023   IMAGING / PROCEDURES: MR ABDOMEN W WO CONTRAST performed on 02/18/2024 Solid enhancing 13 mm exophilic left inter polar renal lesion, compatible with renal cell carcinoma. No evidence of metastatic disease in the abdomen.  NM PET IMAGE INITIAL (PI) SKULL BASE TO THIGH performed on 02/04/2024 No specific areas of abnormal radiotracer uptake. Small lung nodules are again noted as on the recent examinations and do not show abnormal uptake. Largest again seen right lower lobe measuring 12 mm. This is slightly cavitary. Recommend continued workup per surveillance. The enhancing upper pole left-sided renal lesion on recent CT scan with contrast is not show specific abnormal uptake but is still concerning for renal cell carcinoma based on that examination and recommend dedicated evaluation or workup as per prior recommendation such as urologic consultation.  VAS US  ABI WITH/WO TBI performed on 01/31/2024 Resting right ankle-brachial index is within normal range. The right toe-brachial index is normal.  Resting left ankle-brachial index is within normal range. The left toe-brachial index is normal.    CT ABDOMEN PELVIS W CONTRAST performed on 01/28/2024 Stomach is within normal limits. No gastric wall thickening/mass on CT. No evidence of recurrent or metastatic disease. 16 mm enhancing lesion in the posterior left upper kidney, highly suspicious for in cell carcinoma. MRI abdomen with/without contrast is suggested for further evaluation.  CT CHEST WO CONTRAST performed on 12/09/2023 Stable cavitary spiculated nodule in the right lower lobe measuring 10 x 7 mm. Additional pulmonary nodules are also stable dating back to 07/27/2022. Hepatic  steatosis. Aortic atherosclerosis  MR LUMBAR SPINE WO CONTRAST performed on 01/26/2023 Multilevel degenerative changes of the lumbar spine, worst at L4-L5. Unchanged moderate right lateral recess stenosis encroaching the descending right L5 nerve root and mild bilateral neural foraminal stenosis.  Resolved marrow edema previously seen at the facet joints. Unchanged mild spinal canal stenosis at L2-L3 and L3-L4.  MYOCARDIAL PERFUSION IMAGING STUDY (LEXISCAN) performed on 02/14/2018 Normal left ventricular systolic function with a normal LVEF of 54% Normal myocardial thickening and wall motion Left ventricular cavity size normal SPECT images demonstrate homogenous tracer distribution throughout the  myocardium No evidence of stress-induced myocardial ischemia or arrhythmia Normal low risk study  Impression and Plan:  Sergio Callahan has been referred for pre-anesthesia review and clearance prior to him undergoing the planned anesthetic and procedural courses. Available labs, pertinent testing, and imaging results were personally reviewed by me in preparation for upcoming operative/procedural course. [facility] medical record has been updated following extensive record review and patient interview with PAT staff.   This patient has been appropriately cleared by cardiology with an overall LOW risk of patient experiencing significant perioperative cardiovascular complications. Based on clinical review performed today (03/04/24), barring any significant acute changes in the patient's overall condition, it is anticipated that he will be able to proceed with the planned surgical intervention. Any acute changes in clinical condition may necessitate his procedure being postponed and/or cancelled. Patient will meet with anesthesia team (MD and/or CRNA) on the day of his procedure for preoperative evaluation/assessment. Questions regarding anesthetic course will be fielded at that time.   Pre-surgical  instructions were reviewed with the patient during his PAT appointment, and questions were fielded to satisfaction by PAT clinical staff. He has been instructed on which medications that he will need to hold prior to surgery, as well as the ones that have been deemed safe/appropriate to take on the day of his procedure. As part of the general education provided by PAT, patient made aware both verbally and in writing, that he would need to abstain from the use of any illegal substances during his perioperative course. He was advised that failure to follow the provided instructions could necessitate case cancellation or result in serious perioperative complications up to and including death. Patient encouraged to contact PAT and/or his surgeon's office to discuss any questions or concerns that may arise prior to surgery; verbalized understanding.   Sergio Caroline, MSN, APRN, FNP-C, CEN [facility]  Perioperative Services Nurse Practitioner Phone: [phone] Fax: [phone] 03/04/24 9:54 AM  NOTE: This note has been prepared using Dragon dictation software. Despite my best ability to proofread, there is always the potential that unintentional transcriptional errors may still occur from this process.

## 2024-03-06 ENCOUNTER — Encounter
Admission: RE | Disposition: A | Discharge status: Home / Self Care | Payer: Self-pay | Source: Home / Self Care | Attending: General Surgery

## 2024-03-06 ENCOUNTER — Other Ambulatory Visit: Payer: Self-pay

## 2024-03-06 ENCOUNTER — Ambulatory Visit: Payer: Self-pay | Admitting: Urgent Care

## 2024-03-06 ENCOUNTER — Ambulatory Visit
Admission: RE | Admit: 2024-03-06 | Discharge: 2024-03-06 | Disposition: A | Discharge status: Home / Self Care | Payer: PRIVATE HEALTH INSURANCE | Attending: General Surgery | Admitting: General Surgery

## 2024-03-06 ENCOUNTER — Encounter: Payer: Self-pay | Admitting: General Surgery

## 2024-03-06 DIAGNOSIS — L720 Epidermal cyst: Secondary | ICD-10-CM | POA: Diagnosis not present

## 2024-03-06 DIAGNOSIS — I10 Essential (primary) hypertension: Secondary | ICD-10-CM | POA: Insufficient documentation

## 2024-03-06 DIAGNOSIS — I2510 Atherosclerotic heart disease of native coronary artery without angina pectoris: Secondary | ICD-10-CM | POA: Insufficient documentation

## 2024-03-06 DIAGNOSIS — Z7982 Long term (current) use of aspirin: Secondary | ICD-10-CM | POA: Diagnosis not present

## 2024-03-06 DIAGNOSIS — K219 Gastro-esophageal reflux disease without esophagitis: Secondary | ICD-10-CM | POA: Insufficient documentation

## 2024-03-06 DIAGNOSIS — Z87891 Personal history of nicotine dependence: Secondary | ICD-10-CM | POA: Diagnosis not present

## 2024-03-06 DIAGNOSIS — A630 Anogenital (venereal) warts: Secondary | ICD-10-CM | POA: Insufficient documentation

## 2024-03-06 DIAGNOSIS — Z7902 Long term (current) use of antithrombotics/antiplatelets: Secondary | ICD-10-CM | POA: Insufficient documentation

## 2024-03-06 DIAGNOSIS — Z955 Presence of coronary angioplasty implant and graft: Secondary | ICD-10-CM | POA: Insufficient documentation

## 2024-03-06 DIAGNOSIS — M329 Systemic lupus erythematosus, unspecified: Secondary | ICD-10-CM | POA: Diagnosis not present

## 2024-03-06 DIAGNOSIS — K644 Residual hemorrhoidal skin tags: Secondary | ICD-10-CM | POA: Diagnosis present

## 2024-03-06 HISTORY — PX: RECTAL EXAM UNDER ANESTHESIA: SHX6399

## 2024-03-06 HISTORY — PX: EXCISION OF SKIN TAG: SHX6270

## 2024-03-06 HISTORY — DX: Malignant neoplasm of left kidney, except renal pelvis: C64.2

## 2024-03-06 HISTORY — DX: Other specified health status: Z78.9

## 2024-03-06 SURGERY — EXAM UNDER ANESTHESIA, RECTUM
Anesthesia: General | Site: Rectum

## 2024-03-06 MED ORDER — FENTANYL CITRATE (PF) 100 MCG/2ML IJ SOLN
INTRAMUSCULAR | Status: AC
Start: 2024-03-06 — End: ?
  Filled 2024-03-06: qty 2

## 2024-03-06 MED ORDER — ACETAMINOPHEN 10 MG/ML IV SOLN: 1000.0000 mg | Freq: Once | INTRAVENOUS | Status: DC | PRN

## 2024-03-06 MED ORDER — BUPIVACAINE-EPINEPHRINE (PF) 0.5% -1:200000 IJ SOLN
INTRAMUSCULAR | Status: AC
  Filled 2024-03-06: qty 30

## 2024-03-06 MED ORDER — CHLORHEXIDINE GLUCONATE 0.12 % MT SOLN
15.0000 mL | Freq: Once | OROMUCOSAL | Status: AC
  Administered 2024-03-06: 15 mL via OROMUCOSAL

## 2024-03-06 MED ORDER — LACTATED RINGERS IV SOLN: INTRAVENOUS | Status: DC

## 2024-03-06 MED ORDER — ONDANSETRON HCL 4 MG/2ML IJ SOLN
INTRAMUSCULAR | Status: DC | PRN
  Administered 2024-03-06: 4 mg via INTRAVENOUS

## 2024-03-06 MED ORDER — FENTANYL CITRATE (PF) 100 MCG/2ML IJ SOLN
INTRAMUSCULAR | Status: DC | PRN
Start: 2024-03-06 — End: 2024-03-06
  Administered 2024-03-06 (×2): 25 ug via INTRAVENOUS
  Administered 2024-03-06: 50 ug via INTRAVENOUS

## 2024-03-06 MED ORDER — LIDOCAINE HCL URETHRAL/MUCOSAL 2 % EX GEL
CUTANEOUS | Status: DC | PRN
  Administered 2024-03-06: 1 via TOPICAL

## 2024-03-06 MED ORDER — PROPOFOL 500 MG/50ML IV EMUL
INTRAVENOUS | Status: DC | PRN
  Administered 2024-03-06: 150 ug/kg/min via INTRAVENOUS

## 2024-03-06 MED ORDER — DEXMEDETOMIDINE HCL IN NACL 80 MCG/20ML IV SOLN
INTRAVENOUS | Status: DC | PRN
Start: 2024-03-06 — End: 2024-03-06
  Administered 2024-03-06: 20 ug via INTRAVENOUS

## 2024-03-06 MED ORDER — FENTANYL CITRATE (PF) 100 MCG/2ML IJ SOLN: 25.0000 ug | INTRAMUSCULAR | Status: DC | PRN

## 2024-03-06 MED ORDER — CEFAZOLIN SODIUM-DEXTROSE 2-4 GM/100ML-% IV SOLN
INTRAVENOUS | Status: AC
  Filled 2024-03-06: qty 100

## 2024-03-06 MED ORDER — CEFAZOLIN SODIUM-DEXTROSE 2-4 GM/100ML-% IV SOLN
2.0000 g | INTRAVENOUS | Status: AC
Start: 2024-03-06 — End: 2024-03-06
  Administered 2024-03-06: 2 g via INTRAVENOUS

## 2024-03-06 MED ORDER — BUPIVACAINE LIPOSOME 1.3 % IJ SUSP
INTRAMUSCULAR | Status: AC
  Filled 2024-03-06: qty 20

## 2024-03-06 MED ORDER — TRAMADOL HCL 50 MG PO TABS: 50.0000 mg | ORAL_TABLET | Freq: Four times a day (QID) | ORAL | 0 refills | Status: DC | PRN

## 2024-03-06 MED ORDER — PROPOFOL 1000 MG/100ML IV EMUL
INTRAVENOUS | Status: AC
  Filled 2024-03-06: qty 100

## 2024-03-06 MED ORDER — DROPERIDOL 2.5 MG/ML IJ SOLN: 0.6250 mg | Freq: Once | INTRAMUSCULAR | Status: DC | PRN

## 2024-03-06 MED ORDER — OXYCODONE HCL 5 MG PO TABS: 5.0000 mg | ORAL_TABLET | Freq: Once | ORAL | Status: DC | PRN

## 2024-03-06 MED ORDER — BUPIVACAINE-EPINEPHRINE (PF) 0.5% -1:200000 IJ SOLN
INTRAMUSCULAR | Status: DC | PRN
  Administered 2024-03-06: 30 mL

## 2024-03-06 MED ORDER — OXYCODONE HCL 5 MG/5ML PO SOLN: 5.0000 mg | Freq: Once | ORAL | Status: DC | PRN

## 2024-03-06 MED ORDER — LACTATED RINGERS IV BOLUS
500.0000 mL | Freq: Once | INTRAVENOUS | Status: AC
  Administered 2024-03-06: 500 mL via INTRAVENOUS

## 2024-03-06 MED ORDER — CHLORHEXIDINE GLUCONATE 0.12 % MT SOLN
OROMUCOSAL | Status: AC
  Filled 2024-03-06: qty 15

## 2024-03-06 MED ORDER — ORAL CARE MOUTH RINSE: 15.0000 mL | Freq: Once | OROMUCOSAL | Status: AC

## 2024-03-06 SURGICAL SUPPLY — 30 items
BRIEF MESH DISP 2XL (UNDERPADS AND DIAPERS) ×1 IMPLANT
DISSECTOR SURG LIGASURE 21 (MISCELLANEOUS) IMPLANT
DRAPE LAPAROTOMY 100X77 ABD (DRAPES) ×1 IMPLANT
DRAPE LEGGINS SURG 28X43 STRL (DRAPES) ×1 IMPLANT
DRAPE UNDER BUTTOCK W/FLU (DRAPES) ×1 IMPLANT
DRSG GAUZE FLUFF 36X18 (GAUZE/BANDAGES/DRESSINGS) IMPLANT
ELECTRODE REM PT RTRN 9FT ADLT (ELECTROSURGICAL) ×1 IMPLANT
GAUZE 4X4 16PLY ~~LOC~~+RFID DBL (SPONGE) IMPLANT
GAUZE SPONGE 4X4 12PLY STRL (GAUZE/BANDAGES/DRESSINGS) ×1 IMPLANT
GLOVE BIO SURGEON STRL SZ 6.5 (GLOVE) ×1 IMPLANT
GLOVE BIOGEL PI IND STRL 6.5 (GLOVE) ×1 IMPLANT
GOWN STRL REUS W/ TWL LRG LVL3 (GOWN DISPOSABLE) ×2 IMPLANT
HEMOSTAT SURGICEL 2X3 (HEMOSTASIS) IMPLANT
KIT TURNOVER CYSTO (KITS) ×1 IMPLANT
MANIFOLD NEPTUNE II (INSTRUMENTS) ×1 IMPLANT
NDL HYPO 22X1.5 SAFETY MO (MISCELLANEOUS) ×1 IMPLANT
NEEDLE HYPO 22X1.5 SAFETY MO (MISCELLANEOUS) ×1 IMPLANT
NS IRRIG 500ML POUR BTL (IV SOLUTION) ×1 IMPLANT
PACK BASIN MINOR ARMC (MISCELLANEOUS) ×1 IMPLANT
PAD ABD DERMACEA PRESS 5X9 (GAUZE/BANDAGES/DRESSINGS) ×1 IMPLANT
PAD PREP OB/GYN DISP 24X41 (PERSONAL CARE ITEMS) ×1 IMPLANT
SOLUTION PREP PVP 2OZ (MISCELLANEOUS) ×1 IMPLANT
SURGILUBE 2OZ TUBE FLIPTOP (MISCELLANEOUS) ×1 IMPLANT
SUT VIC AB 2-0 SH 27XBRD (SUTURE) ×1 IMPLANT
SUT VIC AB 3-0 SH 27X BRD (SUTURE) ×1 IMPLANT
SUTURE EHLN 3-0 FS-10 30 BLK (SUTURE) IMPLANT
SUTURE MNCRL 4-0 27XMF (SUTURE) IMPLANT
SYR 10ML LL (SYRINGE) ×1 IMPLANT
SYR BULB IRRIG 60ML STRL (SYRINGE) ×1 IMPLANT
TRAP FLUID SMOKE EVACUATOR (MISCELLANEOUS) ×1 IMPLANT

## 2024-03-06 NOTE — Anesthesia Postprocedure Evaluation (Signed)
 Anesthesia Post Note  Patient: Sergio Callahan  Procedure(s) Performed: EXAM UNDER ANESTHESIA, RECTUM (Rectum) EXCISION, SKIN TAG (Rectum)  Patient location during evaluation: PACU Anesthesia Type: General Level of consciousness: awake and alert Pain management: pain level controlled Vital Signs Assessment: post-procedure vital signs reviewed and stable Respiratory status: spontaneous breathing, nonlabored ventilation and respiratory function stable Cardiovascular status: blood pressure returned to baseline and stable Postop Assessment: no apparent nausea or vomiting Anesthetic complications: no   There were no known notable events for this encounter.   Last Vitals:  Vitals:   03/06/24 1145 03/06/24 1203  BP:  (!) 158/80  Pulse: 61 92  Resp: 11 16  Temp: (!) 36.1 C   SpO2: 97% 100%    Last Pain:  Vitals:   03/06/24 1145  PainSc: 0-No pain                 Baltazar Bonier

## 2024-03-06 NOTE — Transfer of Care (Signed)
 Immediate Anesthesia Transfer of Care Note  Patient: Sergio Callahan  Procedure(s) Performed: EXAM UNDER ANESTHESIA, RECTUM (Rectum) EXCISION, SKIN TAG (Rectum)  Patient Location: PACU  Anesthesia Type:General  Level of Consciousness: awake, alert , and oriented  Airway & Oxygen Therapy: Patient Spontanous Breathing and Patient connected to face mask oxygen  Post-op Assessment: Report given to RN and Post -op Vital signs reviewed and stable  Post vital signs: stable pt talking with staff, big deep coughing to help clear lungs. VSS. Face mask on 3L  Last Vitals:  Vitals Value Taken Time  BP 81/63 03/06/24 1046  Temp 36.2 C 03/06/24 1037  Pulse 71 03/06/24 1047  Resp 20 03/06/24 1047  SpO2 95 % 03/06/24 1047  Vitals shown include unfiled device data.  Last Pain:  Vitals:   03/06/24 1037  PainSc: 0-No pain         Complications: No notable events documented.

## 2024-03-06 NOTE — Interval H&P Note (Signed)
 History and Physical Interval Note:  03/06/2024 9:22 AM  Sergio Callahan  has presented today for surgery, with the diagnosis of K64.4 anal skin tag.  The various methods of treatment have been discussed with the patient and family. After consideration of risks, benefits and other options for treatment, the patient has consented to  Procedure(s): EXAM UNDER ANESTHESIA, RECTUM (N/A) EXCISION, SKIN TAG (N/A) as a surgical intervention.  The patient's history has been reviewed, patient examined, no change in status, stable for surgery.  I have reviewed the patient's chart and labs.  Questions were answered to the patient's satisfaction.     [name]

## 2024-03-06 NOTE — Discharge Instructions (Signed)
  Diet: Resume home heart healthy regular diet.   Activity: Increase activity as tolerated.   Wound care: May shower with soapy water  and pat dry (do not rub incisions), but no baths or submerging incision underwater until follow-up. (no swimming)   May use Calmoseptine on perianal area for burning and pain.   Medications: Resume all home medications. For mild to moderate pain: acetaminophen  (Tylenol ) or ibuprofen (if no kidney disease). Combining Tylenol  with alcohol can substantially increase your risk of causing liver disease. Narcotic pain medications, if prescribed, can be used for severe pain, though may cause nausea, constipation, and drowsiness. If you do not need the narcotic pain medication, you do not need to fill the prescription.  Call office [phone]) at any time if any questions, worsening pain, fevers/chills, bleeding, drainage from incision site, or other concerns.

## 2024-03-06 NOTE — Anesthesia Preprocedure Evaluation (Addendum)
 Anesthesia Evaluation  Patient identified by MRN, date of birth, ID band Patient awake    Reviewed: Allergy & Precautions, H&P , NPO status , Patient's Chart, lab work & pertinent test results  Airway Mallampati: II  TM Distance: >3 FB Neck ROM: full    Dental no notable dental hx.    Pulmonary Patient abstained from smoking., former smoker   Pulmonary exam normal        Cardiovascular Exercise Tolerance: Poor hypertension, + CAD, + Cardiac Stents and + Peripheral Vascular Disease  Normal cardiovascular exam     Neuro/Psych negative neurological ROS  negative psych ROS   GI/Hepatic negative GI ROS, Neg liver ROS,,,  Endo/Other  negative endocrine ROS  History of lupus  Renal/GU Renal cell carcinoma, left (HCC)     Musculoskeletal  (+) Arthritis ,    Abdominal Normal abdominal exam  (+)   Peds  Hematology negative hematology ROS (+)   Anesthesia Other Findings Super D chest CT imaging performed on 07/17/2023 demonstrated a spiculated morphology of a concerning nodule in the RIGHT lower pulmonary lobe.  Repeat CT imaging of the chest performed on 12/09/2023 revealed that the area of concern now was cavitary and spiculated measuring 10 x 7 mm.  Patient was referred for further evaluation with pulmonary medicine to discuss tissue biopsy.  Patient subsequently scheduled for a ROBOTIC ASSISTED NAVIGATIONAL BRONCHOSCOPY (Bilateral) on 12/31/2023 with Dr. Vergia Glasgow, MD. Given patient's past medical history significant for cardiovascular diagnoses, presurgical cardiac clearance was sought by the PAT team. Per cardiology, "this patient is optimized for surgery and may proceed with the planned procedural course with a LOW risk of significant perioperative cardiovascular complications".  Follows with cardiology at [facility] for history of CAD s/p PCI to the LAD in 2012, peripheral vascular disease s/p stenting to bilateral  common iliac arteries, and hypertension.  Nuclear stress test 2019 was low risk.   Former smoker, 45 pack years, quit 2022.  Past Medical History: No date: AAA (abdominal aortic aneurysm) (HCC) No date: Abdominal aortic aneurysm without rupture (HCC) No date: Adenomatous colon polyp No date: Allergic rhinitis No date: Anemia No date: Anogenital (venereal) warts No date: Anxiety     Comment:  a.) on BZO PRN (alprazolam ) No date: Aortic atherosclerosis (HCC) No date: Arthritis No date: B12 deficiency No date: Back pain No date: Bilateral adrenal adenomas No date: BPH (benign prostatic hyperplasia) No date: Carpal tunnel syndrome No date: Celiac artery atherosclerosis No date: Coronary artery disease     Comment:  a.) MV 02/26/2005: no ischemia; b.) MV 11/24/2010: mild               inferosep ischemia; c.) LHC/PCI 12/08/2010: 99% pLAD -->               rotational athrectomy + 3.0 x 23 mm Xience DES x 1; d.)               MV 02/15/2011: no ischemia; e.) MV 02/14/2018: no               ischemia No date: Degenerative joint disease No date: DOE (dyspnea on exertion) No date: Duodenitis 05/2023: Elevated liver enzymes No date: Erectile dysfunction     Comment:  a.) on PDE5i (tadalafil ) No date: GERD (gastroesophageal reflux disease) No date: Hepatic steatosis No date: History of shingles No date: History of tobacco abuse No date: 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 No date: Hx of adenomatous colonic polyps No date: Hyperlipidemia No date: Hypertension No  date: Hyponatremia No date: Iliac artery aneurysm (HCC) No date: Long term (current) use of aspirin  No date: Long term current use of immunosuppressive drug     Comment:  a.) Tx'd with azathioprine + hydroxychloroquine for SLE No date: Lumbar stenosis No date: MGUS (monoclonal gammopathy of unknown significance) No date: Multiple pulmonary nodules determined by computed tomography  of lung 2023: Neuroendocrine carcinoma of stomach (HCC) No date:  Nocturia 06/2023: Nodule of lower lobe of right lung No date: Paresthesia of left lower extremity No date: Peripheral vascular disease (HCC) 06/20/2021: Right lower lobe pulmonary nodule     Comment:  a.) Dotatate PET 06/20/2021: posterior RLL 0.6 x 0.5 mm:              b.) LDCT chest 07/27/2022: 0.8 x 0.6 mm; c.) Dotatate PET              02/04/2023: 0.7 x 0.7 mm; d.) CT chest 05/13/2023: 8 x 11              mm; e.) super D chest CT 07/17/2023: spiclated morphology              9 x 8 mm; f.) CT chest 12/09/2023: cavitary spiculated               nodule 10 x 7 mm No date: Rotator cuff tendonitis No date: Shingles No date: 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)     Comment:  a.) Tx'd with azathioprine + hydroxychloroquine No date: Urinary frequency  Past Surgical History: No date: BACK SURGERY 08/28/2021: BIOPSY     Comment:  Procedure: BIOPSY;  Surgeon: Normie Becton.,               MD;  Location: [facility];  Service: Gastroenterology;; 07/18/2023: BRONCHIAL BIOPSY     Comment:  Procedure: BRONCHIAL BIOPSIES;  Surgeon: Vergia Glasgow,              MD;  Location: [facility];  Service: Pulmonary;; 07/18/2023: BRONCHIAL NEEDLE ASPIRATION BIOPSY     Comment:  Procedure: BRONCHIAL NEEDLE ASPIRATION BIOPSIES;                Surgeon: Vergia Glasgow, MD;  Location: [facility];                Service: Pulmonary;; 07/18/2023: BRONCHIAL WASHINGS     Comment:  Procedure: BRONCHIAL WASHINGS;  Surgeon: Vergia Glasgow,              MD;  Location: [facility];  Service: Pulmonary;; 01/27/2021: BROW LIFT; Bilateral     Comment:  Procedure: BLEPHAROPLASTY UPPER EYELID; W/EXCESS SKIN               BROW PTOSIS REPAIR AND BLEPHAROPTOSIS REPAIR; RESECT EX               BILATERAL;  Surgeon: Zacarias Hermann, MD;  Location: [facility];  Service: Ophthalmology;  Laterality:               Bilateral; No date: COLONOSCOPY 09/30/2019: COLONOSCOPY WITH  PROPOFOL ; N/A     Comment:  Procedure: COLONOSCOPY WITH PROPOFOL ;  Surgeon: Marshall Skeeter, MD;  Location: [facility];  Service:               Endoscopy;  Laterality: N/A; 05/05/2021: COLONOSCOPY WITH PROPOFOL ;  N/A     Comment:  Procedure: COLONOSCOPY WITH PROPOFOL ;  Surgeon:               Shane Darling, MD;  Location: [facility];                Service: Endoscopy;  Laterality: N/A; 12/08/2010: CORONARY ANGIOPLASTY WITH STENT PLACEMENT; Left     Comment:  Procedure: CORONARY ANGIOPLASTY WITH STENT PLACEMENT               (pLAD); Procedure: [facility] No date: ESOPHAGOGASTRODUODENOSCOPY 05/05/2021: ESOPHAGOGASTRODUODENOSCOPY (EGD) WITH PROPOFOL ; N/A     Comment:  Procedure: ESOPHAGOGASTRODUODENOSCOPY (EGD) WITH               PROPOFOL ;  Surgeon: Shane Darling, MD;  Location:               [facility];  Service: Endoscopy;  Laterality: N/A; 08/28/2021: ESOPHAGOGASTRODUODENOSCOPY (EGD) WITH PROPOFOL ; N/A     Comment:  Procedure: ESOPHAGOGASTRODUODENOSCOPY (EGD) WITH               PROPOFOL ;  Surgeon: Brice Campi Albino Alu., MD;                Location: [facility];  Service: Gastroenterology;                Laterality: N/A; 08/17/2022: ESOPHAGOGASTRODUODENOSCOPY (EGD) WITH PROPOFOL ; N/A     Comment:  Procedure: ESOPHAGOGASTRODUODENOSCOPY (EGD) WITH               PROPOFOL ;  Surgeon: Shane Darling, MD;  Location:               [facility];  Service: Endoscopy;  Laterality: N/A; 08/28/2021: EUS; N/A     Comment:  Procedure: UPPER ENDOSCOPIC ULTRASOUND (EUS) RADIAL;                Surgeon: Brice Campi Albino Alu., MD;  Location: [facility]               ENDOS[facility];  Service: Gastroenterology;  Laterality: N/A; 02/03/2015: LUMBAR LAMINECTOMY/DECOMPRESSION MICRODISCECTOMY; Left     Comment:  Procedure: LUMBAR LAMINECTOMY/DECOMPRESSION               MICRODISCECTOMY LEFT LUMBAR FOUR-FIVE WITH REMOVAL OF               SYNOVIAL CYST;  Surgeon: Garry Kansas, MD;   Location:               [facility];  Service: Neurosurgery;  Laterality: Left; 10/23/2019: NASAL SEPTOPLASTY W/ TURBINOPLASTY; Bilateral     Comment:  Procedure: NASAL SEPTOPLASTY WITH SUBMUCOUS RESECTION OF              TURBINATE;  Surgeon: Lesly Raspberry, MD;  Location:               [facility];  Service: ENT;  Laterality:               Bilateral; 05/02/2018: PELVIC ANGIOGRAPHY; N/A     Comment:  Procedure: PELVIC ANGIOGRAPHY;  Surgeon: Jackquelyn Mass, MD;  Location: [facility];  Service:              Cardiovascular;  Laterality: N/A; 08/28/2021: POLYPECTOMY     Comment:  Procedure: POLYPECTOMY;  Surgeon: Normie Becton., MD;  Location: [facility];  Service:               Gastroenterology;; 12/04/2018: SHOULDER ARTHROSCOPY WITH SUBACROMIAL DECOMPRESSION AND  BICEP TENDON REPAIR; Right     Comment:  Procedure: SHOULDER ARTHROSCOPY WITH DEBRIDEMENT,               DECOMPRESSION, BICEP TENODESIS;  Surgeon: Elner Hahn,               MD;  Location: [facility];  Service: Orthopedics;                Laterality: Right; 08/28/2021: SUBMUCOSAL INJECTION     Comment:  Procedure: SUBMUCOSAL INJECTION;  Surgeon: Normie Becton., MD;  Location: [facility];  Service:               Gastroenterology;;     Reproductive/Obstetrics negative OB ROS                              Anesthesia Physical Anesthesia Plan  ASA: 3  Anesthesia Plan: General   Post-op Pain Management: Regional block*   Induction: Intravenous  PONV Risk Score and Plan: 2 and Ondansetron , Dexamethasone  and Midazolam   Airway Management Planned: Natural Airway  Additional Equipment:   Intra-op Plan:   Post-operative Plan:   Informed Consent: I have reviewed the patients History and Physical, chart, labs and discussed the procedure including the risks, benefits and alternatives for the proposed anesthesia  with the patient or authorized representative who has indicated his/her understanding and acceptance.     Dental Advisory Given  Plan Discussed with: CRNA and Surgeon  Anesthesia Plan Comments:          Anesthesia Quick Evaluation

## 2024-03-06 NOTE — Op Note (Signed)
 Preoperative diagnosis: Multiple anal tags  Postoperative diagnosis: Multiple anal tags  Procedure: Excision of multiple anal tags  Surgeon: Dr. Dortha Gauss  Anesthesia: Sedation and local  Indications: Patient is a 73 y.o. male was found to have multiple anal tags uncomfortable, making difficult for hygiene  Findings: 1.  For medium size and small size anal tags 2.  Abundant amount of perianal dermoid cyst 3. Adequate hemostasis  Description of procedure: The patient was brought to the operating room and spinal anesthesia was induced. Patient was placed in the lithotomy position. A time-out was completed verifying correct patient, procedure, site, positioning, and implant(s) and/or special equipment prior to beginning this procedure. The buttocks were taped apart.  The perineum was prepped and draped in standard sterile fashion. Local anesthetic was injected as a perianal block.  Multiple anal tags (4) were excised using scissors.  Multiple epidermoid cyst were drained.  Hemostasis was achieved.  The incisions for the anal tags were closed with 4-0 Monocryl. A gauze pad was tucked between the gluteal folds.   The patient tolerated the procedure well and was taken to the postanesthesia care unit in stable condition.   Specimen: Anal tags  Complications: none  EBL: 5 mL

## 2024-03-07 ENCOUNTER — Encounter: Payer: Self-pay | Admitting: General Surgery

## 2024-03-09 LAB — SURGICAL PATHOLOGY

## 2024-03-10 ENCOUNTER — Encounter: Payer: Self-pay | Admitting: General Surgery

## 2024-03-31 ENCOUNTER — Encounter (INDEPENDENT_AMBULATORY_CARE_PROVIDER_SITE_OTHER): Payer: Self-pay

## 2024-06-15 ENCOUNTER — Encounter: Payer: Self-pay | Admitting: Urology

## 2024-08-03 ENCOUNTER — Encounter (INDEPENDENT_AMBULATORY_CARE_PROVIDER_SITE_OTHER): Payer: Self-pay | Admitting: Vascular Surgery

## 2024-08-03 ENCOUNTER — Ambulatory Visit (INDEPENDENT_AMBULATORY_CARE_PROVIDER_SITE_OTHER): Payer: PRIVATE HEALTH INSURANCE

## 2024-08-03 ENCOUNTER — Ambulatory Visit (INDEPENDENT_AMBULATORY_CARE_PROVIDER_SITE_OTHER)

## 2024-08-03 ENCOUNTER — Ambulatory Visit (INDEPENDENT_AMBULATORY_CARE_PROVIDER_SITE_OTHER): Payer: PRIVATE HEALTH INSURANCE | Admitting: Vascular Surgery

## 2024-08-03 VITALS — BP 122/83 | HR 73 | Ht 72.0 in | Wt 191.2 lb

## 2024-08-03 DIAGNOSIS — I7143 Infrarenal abdominal aortic aneurysm, without rupture: Secondary | ICD-10-CM

## 2024-08-03 DIAGNOSIS — I2510 Atherosclerotic heart disease of native coronary artery without angina pectoris: Secondary | ICD-10-CM

## 2024-08-03 DIAGNOSIS — I70213 Atherosclerosis of native arteries of extremities with intermittent claudication, bilateral legs: Secondary | ICD-10-CM | POA: Diagnosis not present

## 2024-08-03 DIAGNOSIS — I10 Essential (primary) hypertension: Secondary | ICD-10-CM

## 2024-08-03 DIAGNOSIS — I708 Atherosclerosis of other arteries: Secondary | ICD-10-CM
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
## 2024-08-05 ENCOUNTER — Telehealth (INDEPENDENT_AMBULATORY_CARE_PROVIDER_SITE_OTHER): Payer: Self-pay | Admitting: Vascular Surgery

## 2024-08-05 NOTE — Telephone Encounter (Signed)
 Patient called in inquiring if the letter you said you would do for Medicare was ready. Please advise.

## 2024-08-07 ENCOUNTER — Other Ambulatory Visit: Payer: Self-pay

## 2024-08-07 ENCOUNTER — Encounter (INDEPENDENT_AMBULATORY_CARE_PROVIDER_SITE_OTHER): Payer: Self-pay | Admitting: Vascular Surgery

## 2024-08-07 DIAGNOSIS — N2889 Other specified disorders of kidney and ureter: Secondary | ICD-10-CM

## 2024-08-07 LAB — VAS US ABI WITH/WO TBI
Left ABI: 1.02
Right ABI: 1.05

## 2024-08-11 NOTE — Telephone Encounter (Signed)
 You already put it in the notes and I copied and made the letter. We are good to go!!

## 2024-08-12 DIAGNOSIS — Z9842 Cataract extraction status, left eye: Secondary | ICD-10-CM

## 2024-08-12 HISTORY — DX: Cataract extraction status, left eye: Z98.42

## 2024-08-13 ENCOUNTER — Encounter: Payer: Self-pay | Admitting: Ophthalmology

## 2024-08-13 NOTE — Anesthesia Preprocedure Evaluation (Addendum)
 Anesthesia Evaluation    Airway        Dental   Pulmonary former smoker          Cardiovascular hypertension,   Reported to have AAA, but I don't note it on MRI nor CT  a.) MV 02/26/2005: no ischemia; b.) MV 11/24/2010: mild inferosep ischemia; c.) LHC/PCI 12/08/2010: 99% pLAD --> rotational athrectomy + 3.0 x 23 mm Xience DES x 1; d.) MV 02/15/2011: no ischemia; e.) MV 02/14/2018: no ischemia   Neuro/Psych    GI/Hepatic   Endo/Other    Renal/GU    01-28-24 CT 16 mm enhancing lesion in the posterior left upper kidney, highly suspicious for in cell carcinoma. MRI abdomen with/without contrast is suggested for further evaluation.     02-18-24 MRI  MPRESSION: 1. Solid enhancing 13 mm exophilic left inter polar renal lesion, compatible with renal cell carcinoma. 2. No evidence of metastatic disease in the abdomen.     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 Degenerative joint disease  Rotator cuff tendonitis Anogenital (venereal) warts  Erectile dysfunction Carpal tunnel syndrome  Nodule of lower lobe of right lung DOE (dyspnea on exertion) Neuroendocrine carcinoma of stomach (HCC) B12 deficiency Long term current use of immunosuppressive drug AAA (abdominal aortic aneurysm)  History of tobacco abuse Allergic rhinitis Duodenitis Shingles  MGUS (monoclonal  gammopathy of unknown significance) Long term (current) use of aspirin   Aortic atherosclerosis Hepatic steatosis  Multiple pulmonary nodules determined by computed tomography of lung Right lower lobe pulmonary nodule  Lumbar stenosis Bilateral adrenal adenomas  Atherosclerosis of native artery of right lower extremity with intermittent claudication Renal cell carcinoma, left (HCC)  Daily consumption of alcohol Aortic atherosclerosis DOE Hx heart artery stent  Reproductive/Obstetrics                              Anesthesia Physical Anesthesia Plan  ASA: 4  Anesthesia Plan: MAC   Post-op Pain Management:    Induction: Intravenous  PONV Risk Score and Plan:   Airway Management Planned: Natural Airway and Nasal Cannula  Additional Equipment:   Intra-op Plan:   Post-operative Plan:   Informed Consent: I have reviewed the patients History and Physical, chart, labs and discussed the procedure including the risks, benefits and alternatives for the proposed anesthesia with the patient or authorized representative who has indicated his/her understanding and acceptance.     Dental Advisory Given  Plan Discussed with: Anesthesiologist, CRNA and Surgeon  Anesthesia Plan Comments: (Patient consented for risks of anesthesia including but not limited to:  - adverse reactions to medications - damage to eyes, teeth, lips or other oral mucosa - nerve damage due to positioning  - sore throat or hoarseness - Damage to heart, brain, nerves, lungs, other parts of body or loss of life  Patient voiced understanding and assent.)         Anesthesia Quick Evaluation

## 2024-08-17 ENCOUNTER — Other Ambulatory Visit: Payer: Self-pay

## 2024-08-17 ENCOUNTER — Encounter: Payer: Self-pay | Admitting: Ophthalmology

## 2024-08-17 ENCOUNTER — Ambulatory Visit
Admission: RE | Admit: 2024-08-17 | Discharge: 2024-08-17 | Disposition: A | Discharge status: Home / Self Care | Source: Ambulatory Visit | Attending: Urology | Admitting: Urology

## 2024-08-17 DIAGNOSIS — N2889 Other specified disorders of kidney and ureter: Secondary | ICD-10-CM | POA: Diagnosis present

## 2024-08-17 MED ORDER — GADOBUTROL 1 MMOL/ML IV SOLN
7.5000 mL | Freq: Once | INTRAVENOUS | Status: AC | PRN
Start: 2024-08-17 — End: 2024-08-17
  Administered 2024-08-17: 7.5 mL via INTRAVENOUS

## 2024-08-18 NOTE — Discharge Instructions (Signed)

## 2024-08-19 ENCOUNTER — Ambulatory Visit
Admission: RE | Admit: 2024-08-19 | Discharge: 2024-08-19 | Disposition: A | Discharge status: Home / Self Care | Payer: PRIVATE HEALTH INSURANCE | Attending: Ophthalmology | Admitting: Ophthalmology

## 2024-08-19 ENCOUNTER — Other Ambulatory Visit: Payer: Self-pay

## 2024-08-19 ENCOUNTER — Encounter: Payer: Self-pay | Admitting: Ophthalmology

## 2024-08-19 ENCOUNTER — Encounter
Admission: RE | Disposition: A | Discharge status: Home / Self Care | Payer: Self-pay | Source: Home / Self Care | Attending: Ophthalmology

## 2024-08-19 ENCOUNTER — Ambulatory Visit: Payer: Self-pay | Admitting: Anesthesiology

## 2024-08-19 DIAGNOSIS — Z87891 Personal history of nicotine dependence: Secondary | ICD-10-CM | POA: Insufficient documentation

## 2024-08-19 DIAGNOSIS — M1990 Unspecified osteoarthritis, unspecified site: Secondary | ICD-10-CM | POA: Insufficient documentation

## 2024-08-19 DIAGNOSIS — F419 Anxiety disorder, unspecified: Secondary | ICD-10-CM | POA: Insufficient documentation

## 2024-08-19 DIAGNOSIS — I739 Peripheral vascular disease, unspecified: Secondary | ICD-10-CM | POA: Diagnosis not present

## 2024-08-19 DIAGNOSIS — K219 Gastro-esophageal reflux disease without esophagitis: Secondary | ICD-10-CM | POA: Diagnosis not present

## 2024-08-19 DIAGNOSIS — H2511 Age-related nuclear cataract, right eye: Secondary | ICD-10-CM | POA: Insufficient documentation

## 2024-08-19 DIAGNOSIS — I2510 Atherosclerotic heart disease of native coronary artery without angina pectoris: Secondary | ICD-10-CM | POA: Diagnosis not present

## 2024-08-19 DIAGNOSIS — I10 Essential (primary) hypertension: Secondary | ICD-10-CM | POA: Insufficient documentation

## 2024-08-19 HISTORY — PX: CATARACT EXTRACTION W/PHACO: SHX586

## 2024-08-19 HISTORY — DX: Other forms of dyspnea: R06.09

## 2024-08-19 HISTORY — DX: Presence of coronary angioplasty implant and graft: Z95.5

## 2024-08-19 SURGERY — PHACOEMULSIFICATION, CATARACT, WITH IOL INSERTION
Anesthesia: Monitor Anesthesia Care | Laterality: Right

## 2024-08-19 MED ORDER — TETRACAINE HCL 0.5 % OP SOLN
OPHTHALMIC | Status: AC
  Filled 2024-08-19: qty 4

## 2024-08-19 MED ORDER — SIGHTPATH DOSE#1 BSS IO SOLN
INTRAOCULAR | Status: DC | PRN
  Administered 2024-08-19: 51 mL via OPHTHALMIC

## 2024-08-19 MED ORDER — ARMC OPHTHALMIC DILATING DROPS
OPHTHALMIC | Status: AC
Start: 2024-08-19 — End: 2024-08-19
  Filled 2024-08-19: qty 0.5

## 2024-08-19 MED ORDER — LACTATED RINGERS IV SOLN: INTRAVENOUS | Status: DC

## 2024-08-19 MED ORDER — ARMC OPHTHALMIC DILATING DROPS
1.0000 | OPHTHALMIC | Status: DC | PRN
  Administered 2024-08-19 (×3): 1 via OPHTHALMIC

## 2024-08-19 MED ORDER — BRIMONIDINE TARTRATE-TIMOLOL 0.2-0.5 % OP SOLN
OPHTHALMIC | Status: DC | PRN
  Administered 2024-08-19: 1 [drp] via OPHTHALMIC

## 2024-08-19 MED ORDER — FENTANYL CITRATE (PF) 100 MCG/2ML IJ SOLN
INTRAMUSCULAR | Status: DC | PRN
  Administered 2024-08-19: 50 ug via INTRAVENOUS

## 2024-08-19 MED ORDER — SIGHTPATH DOSE#1 NA HYALUR & NA CHOND-NA HYALUR IO KIT
PACK | INTRAOCULAR | Status: DC | PRN
Start: 2024-08-19 — End: 2024-08-19
  Administered 2024-08-19: 1 via OPHTHALMIC

## 2024-08-19 MED ORDER — TETRACAINE HCL 0.5 % OP SOLN
1.0000 [drp] | OPHTHALMIC | Status: DC | PRN
  Administered 2024-08-19 (×3): 1 [drp] via OPHTHALMIC

## 2024-08-19 MED ORDER — MIDAZOLAM HCL 2 MG/2ML IJ SOLN
INTRAMUSCULAR | Status: DC | PRN
  Administered 2024-08-19 (×2): 1 mg via INTRAVENOUS

## 2024-08-19 MED ORDER — SIGHTPATH DOSE#1 BSS IO SOLN
INTRAOCULAR | Status: DC | PRN
Start: 2024-08-19 — End: 2024-08-19
  Administered 2024-08-19: 15 mL via INTRAOCULAR

## 2024-08-19 MED ORDER — CEFUROXIME OPHTHALMIC INJECTION 1 MG/0.1 ML
INJECTION | OPHTHALMIC | Status: DC | PRN
  Administered 2024-08-19: .1 mL via INTRACAMERAL

## 2024-08-19 MED ORDER — LIDOCAINE HCL (PF) 2 % IJ SOLN
INTRAOCULAR | Status: DC | PRN
  Administered 2024-08-19: 1 mL

## 2024-08-19 MED ORDER — FENTANYL CITRATE (PF) 100 MCG/2ML IJ SOLN
INTRAMUSCULAR | Status: AC
  Filled 2024-08-19: qty 2

## 2024-08-19 MED ORDER — MIDAZOLAM HCL 2 MG/2ML IJ SOLN
INTRAMUSCULAR | Status: AC
  Filled 2024-08-19: qty 2

## 2024-08-19 SURGICAL SUPPLY — 8 items
FEE CATARACT SUITE SIGHTPATH (MISCELLANEOUS) ×1 IMPLANT
GLOVE BIOGEL PI IND STRL 8 (GLOVE) ×1 IMPLANT
GLOVE SURG LX STRL 7.5 STRW (GLOVE) ×1 IMPLANT
GLOVE SURG SYN 6.5 PF PI BL (GLOVE) ×1 IMPLANT
LENS IOL CLRN VT TRC 5 16.0 IMPLANT
NDL FILTER BLUNT 18X1 1/2 (NEEDLE) ×1 IMPLANT
NEEDLE FILTER BLUNT 18X1 1/2 (NEEDLE) ×1 IMPLANT
SYR 3ML LL SCALE MARK (SYRINGE) ×1 IMPLANT

## 2024-08-19 NOTE — Anesthesia Postprocedure Evaluation (Signed)
 Anesthesia Post Note  Patient: Sergio Callahan  Procedure(s) Performed: PHACOEMULSIFICATION, CATARACT, WITH IOL INSERTION 7.83, 00:36.0 (Right)  Patient location during evaluation: PACU Anesthesia Type: MAC Level of consciousness: awake and alert Pain management: pain level controlled Vital Signs Assessment: post-procedure vital signs reviewed and stable Respiratory status: spontaneous breathing, nonlabored ventilation, respiratory function stable and patient connected to nasal cannula oxygen Cardiovascular status: stable and blood pressure returned to baseline Postop Assessment: no apparent nausea or vomiting Anesthetic complications: no   No notable events documented.   Last Vitals:  Vitals:   08/19/24 0843 08/19/24 0850  BP: 132/86 (!) 125/94  Pulse: 80 84  Resp: (!) 22 (!) 22  Temp: 36.5 C (!) 36.4 C  SpO2: 96% 95%    Last Pain:  Vitals:   08/19/24 0850  TempSrc:   PainSc: 0-No pain                 Giorgio Chabot C Conlin Brahm

## 2024-08-19 NOTE — Op Note (Signed)
 LOCATION:  [facility]   PREOPERATIVE DIAGNOSIS:  Nuclear sclerotic cataract of the right eye.  H25.11   POSTOPERATIVE DIAGNOSIS:  Nuclear sclerotic cataract of the right eye.   PROCEDURE:  Phacoemulsification with Toric posterior chamber intraocular lens placement of the right eye.  Ultrasound time: Procedure(s): PHACOEMULSIFICATION, CATARACT, WITH IOL INSERTION 7.83, 00:36.0 (Right)  LENS:   Implant Name Type Inv. Item Serial No. Manufacturer Lot No. LRB No. Used Action  LENS IOL CLRN VT TRC 5 16.0 - [id number]  LENS IOL CLRN VT TRC 5 16.0 [id number] [employer]  Right 1 Implanted     [id number] Vivity Toric intraocular lens with 3.0 diopters of cylindrical power with axis orientation at 17 degrees.   SURGEON:  Dene FABIENE Etienne, MD   ANESTHESIA: Topical with tetracaine  drops and 2% Xylocaine  jelly, augmented with 1% preservative-free intracameral lidocaine . .   COMPLICATIONS:  None.   DESCRIPTION OF PROCEDURE:  The patient was identified in the holding room and transported to the operating suite and placed in the supine position under the operating microscope.  The right eye was identified as the operative eye, and it was prepped and draped in the usual sterile ophthalmic fashion.    A clear-corneal paracentesis incision was made at the 12:00 position.  0.5 ml of preservative-free 1% lidocaine  was injected into the anterior chamber. The anterior chamber was filled with Viscoat.  A 2.4 millimeter near clear corneal incision was then made at the 9:00 position.  A cystotome and capsulorrhexis forceps were then used to make a curvilinear capsulorrhexis.  Hydrodissection and hydrodelineation were then performed using balanced salt solution.   Phacoemulsification was then used in stop and chop fashion to remove the lens, nucleus and epinucleus.  The remaining cortex was aspirated using the irrigation and aspiration handpiece.  Provisc viscoelastic was then placed into the capsular  bag to distend it for lens placement.  The Verion digital marker was used to align the implant at the intended axis.   A Toric lens was then injected into the capsular bag.  It was rotated clockwise until the axis marks on the lens were approximately 15 degrees in the counterclockwise direction to the intended alignment.  The viscoelastic was aspirated from the eye using the irrigation aspiration handpiece.  Then, a Koch spatula through the sideport incision was used to rotate the lens in a clockwise direction until the axis markings of the intraocular lens were lined up with the Verion alignment.  Balanced salt solution was then used to hydrate the wounds. Cefuroxime 0.1 ml of a 10mg /ml solution was injected into the anterior chamber for a dose of 1 mg of intracameral antibiotic at the completion of the case.    The eye was noted to have a physiologic pressure and there was no wound leak noted.   Timolol and Brimonidine drops were applied to the eye.  The patient was taken to the recovery room in stable condition having had no complications of anesthesia or surgery.  Canon Gola 08/19/2024, 8:41 AM

## 2024-08-19 NOTE — H&P (Signed)
 [facility]   Primary Care Physician:  Rudolpho Norleen BIRCH, MD Ophthalmologist: Dr. Dene Etienne  Pre-Procedure History & Physical: HPI:  Sergio Callahan is a 73 y.o. male here for ophthalmic surgery.   Past Medical History:  Diagnosis Date   AAA (abdominal aortic aneurysm)    Abdominal aortic aneurysm without rupture    Adenomatous colon polyp    Allergic rhinitis    Anemia    Anogenital (venereal) warts    Anxiety    a.) on BZO PRN (alprazolam )   Aortic atherosclerosis    Aortic atherosclerosis    Arthritis    Atherosclerosis of native artery of right lower extremity with intermittent claudication    B12 deficiency    Back pain    Bilateral adrenal adenomas    BPH (benign prostatic hyperplasia)    Carpal tunnel syndrome    Celiac artery atherosclerosis    Coronary artery disease    a.) MV 02/26/2005: no ischemia; b.) MV 11/24/2010: mild inferosep ischemia; c.) LHC/PCI 12/08/2010: 99% pLAD --> rotational athrectomy + 3.0 x 23 mm Xience DES x 1; d.) MV 02/15/2011: no ischemia; e.) MV 02/14/2018: no ischemia   Daily consumption of alcohol    Degenerative joint disease    DOE (dyspnea on exertion)    Duodenitis    Dyspnea on exertion    Erectile dysfunction    a.) on PDE5i (tadalafil )   GERD (gastroesophageal reflux disease)    H/O heart artery stent    Hepatic steatosis    History of shingles    History of tobacco abuse    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    Hx of adenomatous colonic polyps    Hyperlipidemia    Hypertension    Hyponatremia    Iliac artery aneurysm    Long term (current) use of aspirin     Long term current use of immunosuppressive drug    a.) Tx'd with azathioprine + hydroxychloroquine for SLE   Lumbar stenosis    MGUS (monoclonal gammopathy of unknown significance)    Multiple pulmonary nodules determined by computed tomography of lung    Neuroendocrine carcinoma of stomach (HCC) 2023   Nocturia    Nodule of lower lobe of right lung 06/2023    Paresthesia of left lower extremity    Peripheral vascular disease    Renal cell carcinoma, left (HCC)    Right lower lobe pulmonary nodule 06/20/2021   a.) Dotatate PET 06/20/2021: posterior RLL 0.6 x 0.5 mm: b.) LDCT chest 07/27/2022: 0.8 x 0.6 mm; c.) Dotatate PET 02/04/2023: 0.7 x 0.7 mm; d.) CT chest 05/13/2023: 8 x 11 mm; e.) super D chest CT 07/17/2023: spiclated morphology 9 x 8 mm; f.) CT chest 12/09/2023: cavitary spiculated nodule 10 x 7 mm   Rotator cuff tendonitis    Shingles    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)    a.) Tx'd with azathioprine + hydroxychloroquine   Urinary frequency     Past Surgical History:  Procedure Laterality Date   BACK SURGERY     BIOPSY  08/28/2021   Procedure: BIOPSY;  Surgeon: Wilhelmenia Aloha Raddle., MD;  Location: [facility];  Service: Gastroenterology;;   BRONCHIAL BIOPSY  07/18/2023   Procedure: BRONCHIAL BIOPSIES;  Surgeon: Isadora Hose, MD;  Location: [facility];  Service: Pulmonary;;   BRONCHIAL BIOPSY Right    BRONCHIAL BIOPSY Right 12/31/2023   BRONCHIAL NEEDLE ASPIRATION BIOPSY  07/18/2023   Procedure: BRONCHIAL NEEDLE ASPIRATION BIOPSIES;  Surgeon: Isadora Hose, MD;  Location:  [facility];  Service: Pulmonary;;   BRONCHIAL WASHINGS  07/18/2023   Procedure: BRONCHIAL WASHINGS;  Surgeon: Isadora Hose, MD;  Location: [facility];  Service: Pulmonary;;   BROW LIFT Bilateral 01/27/2021   Procedure: BLEPHAROPLASTY UPPER EYELID; W/EXCESS SKIN BROW PTOSIS REPAIR AND BLEPHAROPTOSIS REPAIR; RESECT EX BILATERAL;  Surgeon: Ashley Greig HERO, MD;  Location: [facility];  Service: Ophthalmology;  Laterality: Bilateral;   COLONOSCOPY     COLONOSCOPY WITH PROPOFOL  N/A 09/30/2019   Procedure: COLONOSCOPY WITH PROPOFOL ;  Surgeon: Dessa Reyes ORN, MD;  Location: [facility];  Service: Endoscopy;  Laterality: N/A;   COLONOSCOPY WITH PROPOFOL  N/A 05/05/2021   Procedure: COLONOSCOPY WITH PROPOFOL ;  Surgeon:  Maryruth Ole DASEN, MD;  Location: [facility];  Service: Endoscopy;  Laterality: N/A;   CORONARY ANGIOPLASTY WITH STENT PLACEMENT Left 12/08/2010   Procedure: CORONARY ANGIOPLASTY WITH STENT PLACEMENT (pLAD); Procedure: [facility]   ESOPHAGOGASTRODUODENOSCOPY     ESOPHAGOGASTRODUODENOSCOPY (EGD) WITH PROPOFOL  N/A 05/05/2021   Procedure: ESOPHAGOGASTRODUODENOSCOPY (EGD) WITH PROPOFOL ;  Surgeon: Maryruth Ole DASEN, MD;  Location: [facility];  Service: Endoscopy;  Laterality: N/A;   ESOPHAGOGASTRODUODENOSCOPY (EGD) WITH PROPOFOL  N/A 08/28/2021   Procedure: ESOPHAGOGASTRODUODENOSCOPY (EGD) WITH PROPOFOL ;  Surgeon: Wilhelmenia Aloha Raddle., MD;  Location: [facility];  Service: Gastroenterology;  Laterality: N/A;   ESOPHAGOGASTRODUODENOSCOPY (EGD) WITH PROPOFOL  N/A 08/17/2022   Procedure: ESOPHAGOGASTRODUODENOSCOPY (EGD) WITH PROPOFOL ;  Surgeon: Maryruth Ole DASEN, MD;  Location: [facility];  Service: Endoscopy;  Laterality: N/A;   EUS N/A 08/28/2021   Procedure: UPPER ENDOSCOPIC ULTRASOUND (EUS) RADIAL;  Surgeon: Wilhelmenia Aloha Raddle., MD;  Location: [facility];  Service: Gastroenterology;  Laterality: N/A;   EXCISION OF SKIN TAG N/A 03/06/2024   Procedure: EXCISION, SKIN TAG;  Surgeon: Rodolph Romano, MD;  Location: [facility];  Service: General;  Laterality: N/A;   LOWER EXTREMITY ANGIOGRAPHY Left 01/10/2024   Procedure: Lower Extremity Angiography;  Surgeon: Jama Cordella MATSU, MD;  Location: [facility];  Service: Cardiovascular;  Laterality: Left;   LUMBAR LAMINECTOMY/DECOMPRESSION MICRODISCECTOMY Left 02/03/2015   Procedure: LUMBAR LAMINECTOMY/DECOMPRESSION MICRODISCECTOMY LEFT LUMBAR FOUR-FIVE WITH REMOVAL OF SYNOVIAL CYST;  Surgeon: Reyes Budge, MD;  Location: [facility];  Service: Neurosurgery;  Laterality: Left;   NASAL SEPTOPLASTY W/ TURBINOPLASTY Bilateral 10/23/2019   Procedure: NASAL SEPTOPLASTY WITH SUBMUCOUS RESECTION OF TURBINATE;  Surgeon: Herminio Miu, MD;  Location: [facility];  Service: ENT;  Laterality: Bilateral;   PELVIC ANGIOGRAPHY N/A 05/02/2018   Procedure: PELVIC ANGIOGRAPHY;  Surgeon: Jama Cordella MATSU, MD;  Location: [facility];  Service: Cardiovascular;  Laterality: N/A;   POLYPECTOMY  08/28/2021   Procedure: POLYPECTOMY;  Surgeon: Wilhelmenia Aloha Raddle., MD;  Location: [facility];  Service: Gastroenterology;;   RECTAL EXAM UNDER ANESTHESIA N/A 03/06/2024   Procedure: ERASMO UNDER ANESTHESIA, RECTUM;  Surgeon: Rodolph Romano, MD;  Location: [facility];  Service: General;  Laterality: N/A;   SHOULDER ARTHROSCOPY WITH SUBACROMIAL DECOMPRESSION AND BICEP TENDON REPAIR Right 12/04/2018   Procedure: SHOULDER ARTHROSCOPY WITH DEBRIDEMENT, DECOMPRESSION, BICEP TENODESIS;  Surgeon: Edie Norleen PARAS, MD;  Location: [facility];  Service: Orthopedics;  Laterality: Right;   SUBMUCOSAL INJECTION  08/28/2021   Procedure: SUBMUCOSAL INJECTION;  Surgeon: Wilhelmenia Aloha Raddle., MD;  Location: [facility];  Service: Gastroenterology;;    Prior to Admission medications   Medication Sig Start Date End Date Taking? Authorizing Provider  ALPRAZolam  (XANAX ) 0.5 MG tablet Take 0.5 mg by mouth 3 (three) times daily as needed for anxiety.    Yes [provider]  amLODipine (NORVASC) 2.5 MG tablet Take 5 mg by mouth daily.   Yes [provider]  aspirin  EC 81 MG tablet Take 81 mg by mouth daily.   Yes [provider]  azaTHIOprine (IMURAN) 50 MG tablet Take 125 mg by mouth daily.    Yes [provider]  cetirizine (ZYRTEC) 10 MG tablet Take 10 mg by mouth daily.   Yes [provider]  finasteride  (PROSCAR ) 5 MG tablet Take 1 tablet (5 mg total) by mouth daily. 02/25/24  Yes Francisca Redell BROCKS, MD  hydrochlorothiazide  (HYDRODIURIL ) 12.5 MG tablet Take 12.5 mg by mouth daily.   Yes [provider]  hydroxychloroquine (PLAQUENIL) 200 MG tablet Take 200 mg by mouth 2 (two) times  daily.   Yes [provider]  metoprolol  succinate (TOPROL -XL) 50 MG 24 hr tablet Take 50 mg by mouth at bedtime. Take with or immediately following a meal.   Yes [provider]  pantoprazole  (PROTONIX ) 20 MG tablet Take 1 tablet (20 mg total) by mouth daily. 01/11/24 01/10/25 Yes Schnier, Cordella MATSU, MD  pravastatin  (PRAVACHOL ) 40 MG tablet Take 40 mg by mouth at bedtime.   Yes [provider]  Probiotic Product (PROBIOTIC DAILY PO) Take 1 capsule by mouth daily.   Yes [provider]  tamsulosin  (FLOMAX ) 0.4 MG CAPS capsule Take 1 capsule (0.4 mg total) by mouth daily after breakfast. 02/25/24  Yes Francisca Redell BROCKS, MD  traMADol  (ULTRAM ) 50 MG tablet Take 1 tablet (50 mg total) by mouth every 6 (six) hours as needed. 03/06/24 03/06/25 Yes Rodolph Romano, MD  triamcinolone (NASACORT) 55 MCG/ACT AERO nasal inhaler Place 1 spray into the nose daily as needed (allergies).   Yes [provider]  clopidogrel  (PLAVIX ) 75 MG tablet Take 1 tablet (75 mg total) by mouth daily. Patient not taking: Reported on 08/19/2024 01/11/24   Schnier, Cordella MATSU, MD  cyanocobalamin  (,VITAMIN B-12,) 1000 MCG/ML injection Inject 1,000 mcg into the muscle every 14 (fourteen) days.  Patient not taking: Reported on 03/06/2024    [provider]  ELDERBERRY PO Take 2 tablets by mouth daily. Patient not taking: Reported on 03/06/2024    [provider]  ibuprofen (ADVIL,MOTRIN) 200 MG tablet Take 400 mg by mouth every 8 (eight) hours as needed for headache or moderate pain.    [provider]  nicotine (NICODERM CQ - DOSED IN MG/24 HOURS) 21 mg/24hr patch Place 21 mg onto the skin daily.    [provider]  nitroGLYCERIN (NITROLINGUAL) 0.4 MG/SPRAY spray Place 1 spray under the tongue every 5 (five) minutes x 3 doses as needed for chest pain.    [provider]  spironolactone (ALDACTONE) 25 MG tablet Take 25 mg by mouth daily. Patient not  taking: Reported on 03/06/2024    [provider]  tadalafil  (CIALIS ) 20 MG tablet Take 1 tablet (20 mg total) by mouth daily as needed for erectile dysfunction. 02/14/23   Francisca Redell BROCKS, MD    Allergies as of 07/24/2024 - Review Complete 03/06/2024  Allergen Reaction Noted   Fosamax [alendronate sodium]  01/24/2015   Percocet [oxycodone -acetaminophen ] Nausea And Vomiting 10/15/2019    Family History  Problem Relation Age of Onset   Leukemia Mother        dx 79s   Hyperlipidemia Father    Hypertension Father    Diabetes Father    Colon cancer Father        dx 89s   Hearing loss Father  Vascular Disease Father    Lung cancer Brother        d. 59    Social History   Socioeconomic History   Marital status: Married    Spouse name: OSHA, ERRICO (Spouse) [phone] (Home Phone)   Number of children: Not on file   Years of education: Not on file   Highest education level: Not on file  Occupational History   Not on file  Tobacco Use   Smoking status: Former    Current packs/day: 0.00    Average packs/day: 1.5 packs/day for 30.0 years (45.0 ttl pk-yrs)    Types: Cigarettes    Start date: 04/01/1991    Quit date: 03/31/2021    Years since quitting: 3.3   Smokeless tobacco: Never  Vaping Use   Vaping status: Never Used  Substance and Sexual Activity   Alcohol use: Yes    Alcohol/week: 14.0 standard drinks of alcohol    Types: 14 Standard drinks or equivalent per week    Comment: cocktails about 2 daily   Drug use: No   Sexual activity: Yes  Other Topics Concern   Not on file  Social History Narrative   Not on file   Social Drivers of Health   Financial Resource Strain: Low Risk  (02/17/2024)   Received from [facility]   Overall Financial Resource Strain (CARDIA)    Difficulty of Paying Living Expenses: Not hard at all  Food Insecurity: No Food Insecurity (02/17/2024)   Received from [facility]   Hunger Vital Sign     Within the past 12 months, you worried that your food would run out before you got the money to buy more.: Never true    Within the past 12 months, the food you bought just didn't last and you didn't have money to get more.: Never true  Transportation Needs: No Transportation Needs (02/17/2024)   Received from [facility] - Transportation    In the past 12 months, has lack of transportation kept you from medical appointments or from getting medications?: No    Lack of Transportation (Non-Medical): No  Physical Activity: Not on file  Stress: Not on file  Social Connections: Not on file  Intimate Partner Violence: Not on file    Review of Systems: See HPI, otherwise negative ROS  Physical Exam: BP (!) 136/97   Pulse 87   Temp 98.2 F (36.8 C) (Temporal)   Resp (!) 22   Ht 6' (1.829 m)   Wt 86.6 kg   SpO2 97%   BMI 25.90 kg/m  General:   Alert,  pleasant and cooperative in NAD Head:  Normocephalic and atraumatic. Lungs:  Clear to auscultation.    Heart:  Regular rate and rhythm.   Impression/Plan: Sergio Callahan is here for ophthalmic surgery.  Risks, benefits, limitations, and alternatives regarding ophthalmic surgery have been reviewed with the patient.  Questions have been answered.  All parties agreeable.   Sergio GASKIN, MD  08/19/2024, 7:58 AM

## 2024-08-19 NOTE — Transfer of Care (Signed)
 Immediate Anesthesia Transfer of Care Note  Patient: Sergio Callahan  Procedure(s) Performed: PHACOEMULSIFICATION, CATARACT, WITH IOL INSERTION 7.83, 00:36.0 (Right)  Patient Location: PACU  Anesthesia Type: MAC  Level of Consciousness: awake, alert  and patient cooperative  Airway and Oxygen Therapy: Patient Spontanous Breathing and Patient connected to supplemental oxygen  Post-op Assessment: Post-op Vital signs reviewed, Patient's Cardiovascular Status Stable, Respiratory Function Stable, Patent Airway and No signs of Nausea or vomiting  Post-op Vital Signs: Reviewed and stable  Complications: No notable events documented.

## 2024-08-20 ENCOUNTER — Encounter: Payer: Self-pay | Admitting: Ophthalmology

## 2024-08-26 ENCOUNTER — Ambulatory Visit: Payer: PRIVATE HEALTH INSURANCE | Admitting: Urology

## 2024-08-31 NOTE — Discharge Instructions (Signed)

## 2024-09-02 ENCOUNTER — Encounter: Payer: Self-pay | Admitting: Ophthalmology

## 2024-09-02 ENCOUNTER — Other Ambulatory Visit: Payer: Self-pay

## 2024-09-02 ENCOUNTER — Ambulatory Visit: Payer: Self-pay | Admitting: Anesthesiology

## 2024-09-02 ENCOUNTER — Encounter
Admission: RE | Disposition: A | Discharge status: Home / Self Care | Payer: Self-pay | Source: Home / Self Care | Attending: Ophthalmology

## 2024-09-02 ENCOUNTER — Ambulatory Visit
Admission: RE | Admit: 2024-09-02 | Discharge: 2024-09-02 | Disposition: A | Discharge status: Home / Self Care | Payer: PRIVATE HEALTH INSURANCE | Attending: Ophthalmology | Admitting: Ophthalmology

## 2024-09-02 DIAGNOSIS — I10 Essential (primary) hypertension: Secondary | ICD-10-CM | POA: Insufficient documentation

## 2024-09-02 DIAGNOSIS — Z9841 Cataract extraction status, right eye: Secondary | ICD-10-CM | POA: Diagnosis not present

## 2024-09-02 DIAGNOSIS — Z955 Presence of coronary angioplasty implant and graft: Secondary | ICD-10-CM | POA: Insufficient documentation

## 2024-09-02 DIAGNOSIS — M1990 Unspecified osteoarthritis, unspecified site: Secondary | ICD-10-CM | POA: Insufficient documentation

## 2024-09-02 DIAGNOSIS — E785 Hyperlipidemia, unspecified: Secondary | ICD-10-CM | POA: Insufficient documentation

## 2024-09-02 DIAGNOSIS — Z87891 Personal history of nicotine dependence: Secondary | ICD-10-CM | POA: Insufficient documentation

## 2024-09-02 DIAGNOSIS — Z961 Presence of intraocular lens: Secondary | ICD-10-CM | POA: Insufficient documentation

## 2024-09-02 DIAGNOSIS — I739 Peripheral vascular disease, unspecified: Secondary | ICD-10-CM | POA: Insufficient documentation

## 2024-09-02 DIAGNOSIS — F419 Anxiety disorder, unspecified: Secondary | ICD-10-CM | POA: Diagnosis not present

## 2024-09-02 DIAGNOSIS — R0609 Other forms of dyspnea: Secondary | ICD-10-CM | POA: Insufficient documentation

## 2024-09-02 DIAGNOSIS — K219 Gastro-esophageal reflux disease without esophagitis: Secondary | ICD-10-CM | POA: Insufficient documentation

## 2024-09-02 DIAGNOSIS — H2512 Age-related nuclear cataract, left eye: Secondary | ICD-10-CM | POA: Insufficient documentation

## 2024-09-02 DIAGNOSIS — I2510 Atherosclerotic heart disease of native coronary artery without angina pectoris: Secondary | ICD-10-CM | POA: Insufficient documentation

## 2024-09-02 HISTORY — PX: CATARACT EXTRACTION W/PHACO: SHX586

## 2024-09-02 SURGERY — PHACOEMULSIFICATION, CATARACT, WITH IOL INSERTION
Anesthesia: Monitor Anesthesia Care | Site: Eye | Laterality: Left

## 2024-09-02 MED ORDER — SIGHTPATH DOSE#1 NA HYALUR & NA CHOND-NA HYALUR IO KIT
PACK | INTRAOCULAR | Status: DC | PRN
  Administered 2024-09-02: 1 via OPHTHALMIC

## 2024-09-02 MED ORDER — ARMC OPHTHALMIC DILATING DROPS
1.0000 | OPHTHALMIC | Status: DC | PRN
  Administered 2024-09-02 (×3): 1 via OPHTHALMIC

## 2024-09-02 MED ORDER — LIDOCAINE HCL (PF) 2 % IJ SOLN
INTRAOCULAR | Status: DC | PRN
  Administered 2024-09-02: 2 mL

## 2024-09-02 MED ORDER — MIDAZOLAM HCL (PF) 2 MG/2ML IJ SOLN
INTRAMUSCULAR | Status: DC | PRN
Start: 2024-09-02 — End: 2024-09-02
  Administered 2024-09-02: 2 mg via INTRAVENOUS

## 2024-09-02 MED ORDER — SIGHTPATH DOSE#1 BSS IO SOLN
INTRAOCULAR | Status: DC | PRN
  Administered 2024-09-02: 15 mL via INTRAOCULAR

## 2024-09-02 MED ORDER — LACTATED RINGERS IV SOLN: INTRAVENOUS | Status: DC

## 2024-09-02 MED ORDER — CEFUROXIME OPHTHALMIC INJECTION 1 MG/0.1 ML
INJECTION | OPHTHALMIC | Status: DC | PRN
  Administered 2024-09-02: .1 mL via INTRACAMERAL

## 2024-09-02 MED ORDER — FENTANYL CITRATE (PF) 100 MCG/2ML IJ SOLN
INTRAMUSCULAR | Status: AC
  Filled 2024-09-02: qty 2

## 2024-09-02 MED ORDER — TETRACAINE HCL 0.5 % OP SOLN
OPHTHALMIC | Status: AC
  Filled 2024-09-02: qty 4

## 2024-09-02 MED ORDER — MIDAZOLAM HCL 2 MG/2ML IJ SOLN
INTRAMUSCULAR | Status: AC
  Filled 2024-09-02: qty 2

## 2024-09-02 MED ORDER — TETRACAINE HCL 0.5 % OP SOLN
1.0000 [drp] | OPHTHALMIC | Status: DC | PRN
  Administered 2024-09-02 (×3): 1 [drp] via OPHTHALMIC

## 2024-09-02 MED ORDER — ARMC OPHTHALMIC DILATING DROPS
OPHTHALMIC | Status: AC
  Filled 2024-09-02: qty 0.5

## 2024-09-02 MED ORDER — BRIMONIDINE TARTRATE-TIMOLOL 0.2-0.5 % OP SOLN
OPHTHALMIC | Status: DC | PRN
  Administered 2024-09-02: 1 [drp] via OPHTHALMIC

## 2024-09-02 MED ORDER — SIGHTPATH DOSE#1 BSS IO SOLN
INTRAOCULAR | Status: DC | PRN
  Administered 2024-09-02: 83 mL via OPHTHALMIC

## 2024-09-02 MED ORDER — FENTANYL CITRATE (PF) 100 MCG/2ML IJ SOLN
INTRAMUSCULAR | Status: DC | PRN
  Administered 2024-09-02 (×2): 50 ug via INTRAVENOUS

## 2024-09-02 SURGICAL SUPPLY — 8 items
FEE CATARACT SUITE SIGHTPATH (MISCELLANEOUS) ×1 IMPLANT
GLOVE BIOGEL PI IND STRL 8 (GLOVE) ×1 IMPLANT
GLOVE SURG LX STRL 7.5 STRW (GLOVE) ×1 IMPLANT
GLOVE SURG SYN 6.5 PF PI BL (GLOVE) ×1 IMPLANT
LENS IOL CLRN VT TRC 3 15.5 IMPLANT
NDL FILTER BLUNT 18X1 1/2 (NEEDLE) ×1 IMPLANT
NEEDLE FILTER BLUNT 18X1 1/2 (NEEDLE) ×1 IMPLANT
SYR 3ML LL SCALE MARK (SYRINGE) ×1 IMPLANT

## 2024-09-02 NOTE — Op Note (Signed)
 LOCATION:  [facility]   PREOPERATIVE DIAGNOSIS:  Nuclear sclerotic cataract of the left eye.  H25.12  POSTOPERATIVE DIAGNOSIS:  Nuclear sclerotic cataract of the left eye.   PROCEDURE:  Phacoemulsification with Toric posterior chamber intraocular lens placement of the left eye.  Ultrasound time: Procedure(s): PHACOEMULSIFICATION, CATARACT, WITH IOL INSERTION 4.60 00:41.3 (Left)  LENS:   Implant Name Type Inv. Item Serial No. Manufacturer Lot No. LRB No. Used Action  LENS IOL CLRN VT TRC 3 15.5 - [id number]  LENS IOL CLRN VT TRC 3 15.5 [id number] [employer]  Left 1 Implanted     [id number] vivity Toric intraocular lens with 1.5 diopters of cylindrical power with axis orientation at 167 degrees.     SURGEON:  Dene FABIENE Etienne, MD   ANESTHESIA:  Topical with tetracaine  drops and 2% Xylocaine  jelly, augmented with 1% preservative-free intracameral lidocaine .  COMPLICATIONS:  None.   DESCRIPTION OF PROCEDURE:  The patient was identified in the holding room and transported to the operating suite and placed in the supine position under the operating microscope.  The left eye was identified as the operative eye, and it was prepped and draped in the usual sterile ophthalmic fashion.    A clear-corneal paracentesis incision was made at the 1:30 position.  0.5 ml of preservative-free 1% lidocaine  was injected into the anterior chamber. The anterior chamber was filled with Viscoat.  A 2.4 millimeter near clear corneal incision was then made at the 10:30 position.  A cystotome and capsulorrhexis forceps were then used to make a curvilinear capsulorrhexis.  Hydrodissection and hydrodelineation were then performed using balanced salt solution.   Phacoemulsification was then used in stop and chop fashion to remove the lens, nucleus and epinucleus.  The remaining cortex was aspirated using the irrigation and aspiration handpiece.  Provisc viscoelastic was then placed into the capsular bag to  distend it for lens placement.  The Verion digital marker was used to align the implant at the intended axis.   A Toric lens was then injected into the capsular bag.  It was rotated clockwise until the axis marks on the lens were approximately 15 degrees in the counterclockwise direction to the intended alignment.  The viscoelastic was aspirated from the eye using the irrigation aspiration handpiece.  Then, a Koch spatula through the sideport incision was used to rotate the lens in a clockwise direction until the axis markings of the intraocular lens were lined up with the Verion alignment.  Balanced salt solution was then used to hydrate the wounds. Cefuroxime 0.1 ml of a 10mg /ml solution was injected into the anterior chamber for a dose of 1 mg of intracameral antibiotic at the completion of the case.    The eye was noted to have a physiologic pressure and there was no wound leak noted.   Timolol and Brimonidine drops were applied to the eye.  The patient was taken to the recovery room in stable condition having had no complications of anesthesia or surgery.  Quoc Tome 09/02/2024, 11:27 AM

## 2024-09-02 NOTE — Transfer of Care (Signed)
 Immediate Anesthesia Transfer of Care Note  Patient: Sergio Callahan  Procedure(s) Performed: PHACOEMULSIFICATION, CATARACT, WITH IOL INSERTION 4.60 00:41.3 (Left: Eye)  Patient Location: PACU  Anesthesia Type: MAC  Level of Consciousness: awake, alert  and patient cooperative  Airway and Oxygen Therapy: Patient Spontanous Breathing   Post-op Assessment: Post-op Vital signs reviewed, Patient's Cardiovascular Status Stable, Respiratory Function Stable, Patent Airway and No signs of Nausea or vomiting  Post-op Vital Signs: Reviewed and stable  Complications: No notable events documented.

## 2024-09-02 NOTE — Anesthesia Preprocedure Evaluation (Addendum)
 Anesthesia Evaluation  Patient identified by MRN, date of birth, ID band Patient awake    Reviewed: Allergy & Precautions, H&P , NPO status , Patient's Chart, lab work & pertinent test results  Airway Mallampati: III  TM Distance: <3 FB Neck ROM: Full    Dental no notable dental hx.    Pulmonary neg pulmonary ROS, Patient abstained from smoking., former smoker   Pulmonary exam normal breath sounds clear to auscultation       Cardiovascular hypertension, + CAD, + Peripheral Vascular Disease and + DOE  negative cardio ROS Normal cardiovascular exam Rhythm:Regular Rate:Normal   Reported to have AAA, but I don't note it on MRI nor CT   a.) MV 02/26/2005: no ischemia; b.) MV 11/24/2010: mild inferosep ischemia; c.) LHC/PCI 12/08/2010: 99% pLAD --> rotational athrectomy + 3.0 x 23 mm Xience DES x 1; d.) MV 02/15/2011: no ischemia; e.) MV 02/14/2018: no ischemia    Neuro/Psych   Anxiety      Neuromuscular disease negative neurological ROS  negative psych ROS   GI/Hepatic negative GI ROS, Neg liver ROS,GERD  ,,  Endo/Other  negative endocrine ROS    Renal/GU Renal diseasenegative Renal ROS03-18-25 CT 16 mm enhancing lesion in the posterior left upper kidney, highly suspicious for in cell carcinoma. MRI abdomen with/without contrast is suggested for further evaluation.       02-18-24 MRI  MPRESSION: 1. Solid enhancing 13 mm exophilic left inter polar renal lesion, compatible with renal cell carcinoma. 2. No evidence of metastatic disease in the abdomen.     negative genitourinary   Musculoskeletal negative musculoskeletal ROS (+) Arthritis ,    Abdominal   Peds negative pediatric ROS (+)  Hematology negative hematology ROS (+) Blood dyscrasia, anemia   Anesthesia Other Findings Previous cataract surgery 08-19-24 Dr. 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 Degenerative joint disease             Rotator cuff tendonitis Anogenital (venereal) warts          Erectile dysfunction Carpal tunnel syndrome         Nodule of lower lobe of right lung DOE (dyspnea on exertion)Neuroendocrine carcinoma of stomach (HCC) B12 deficiencyLong term current use of immunosuppressive drug AAA (abdominal aortic aneurysm)  History of tobacco abuse Allergic rhinitisDuodenitis Shingles             MGUS (monoclonal gammopathy of unknown significance) Long term (current) use of aspirin    Aortic atherosclerosis Hepatic steatosis          Multiple pulmonary nodules determined by computed tomography of lung Right lower lobe pulmonary nodule  Lumbar stenosis Bilateral adrenal adenomas  Atherosclerosis of native artery of right lower extremity with intermittent claudication Renal cell carcinoma, left (HCC)       Daily consumption of alcohol Aortic atherosclerosis DOE Hx heart artery stent   Reproductive/Obstetrics negative OB ROS                              Anesthesia Physical Anesthesia Plan  ASA: 4  Anesthesia Plan: MAC   Post-op Pain Management:    Induction: Intravenous  PONV Risk Score and Plan:   Airway Management Planned: Natural Airway and Nasal Cannula  Additional Equipment:   Intra-op Plan:   Post-operative Plan:   Informed Consent: I have reviewed the patients History and Physical, chart, labs  and discussed the procedure including the risks, benefits and alternatives for the proposed anesthesia with the patient or authorized representative who has indicated his/her understanding and acceptance.     Dental Advisory Given  Plan Discussed with: Anesthesiologist, CRNA and Surgeon  Anesthesia Plan Comments: (Patient consented for risks of anesthesia including but not limited to:  - adverse reactions to medications - damage to eyes, teeth, lips or other oral mucosa - nerve damage due to positioning  - sore throat or hoarseness - Damage to heart, brain, nerves, lungs, other parts of body or loss of life  Patient voiced understanding and assent.)         Anesthesia Quick Evaluation

## 2024-09-02 NOTE — Anesthesia Postprocedure Evaluation (Signed)
 Anesthesia Post Note  Patient: Sergio Callahan  Procedure(s) Performed: PHACOEMULSIFICATION, CATARACT, WITH IOL INSERTION 4.60 00:41.3 (Left: Eye)  Patient location during evaluation: PACU Anesthesia Type: MAC Level of consciousness: awake and alert Pain management: pain level controlled Vital Signs Assessment: post-procedure vital signs reviewed and stable Respiratory status: spontaneous breathing, nonlabored ventilation, respiratory function stable and patient connected to nasal cannula oxygen Cardiovascular status: stable and blood pressure returned to baseline Postop Assessment: no apparent nausea or vomiting Anesthetic complications: no   No notable events documented.   Last Vitals:  Vitals:   09/02/24 1129 09/02/24 1134  BP: 105/73 (!) 117/95  Pulse: 77 78  Resp: 14 14  Temp: (!) 36.1 C (!) 36.2 C  SpO2: 97% 96%    Last Pain:  Vitals:   09/02/24 1134  TempSrc:   PainSc: 0-No pain                 Jet Traynham C Angelise Petrich

## 2024-09-02 NOTE — H&P (Signed)
  [facility]   Primary Care Physician:  Rudolpho Norleen BIRCH, MD Ophthalmologist: Dr. Dene Etienne  Pre-Procedure History & Physical: HPI:  Sergio Callahan is a 73 y.o. male here for ophthalmic surgery.   Past Medical History:  Diagnosis Date   AAA (abdominal aortic aneurysm)    Abdominal aortic aneurysm without rupture    Adenomatous colon polyp    Allergic rhinitis    Anemia    Anogenital (venereal) warts    Anxiety    a.) on BZO PRN (alprazolam )   Aortic atherosclerosis    Aortic atherosclerosis    Arthritis    Atherosclerosis of native artery of right lower extremity with intermittent claudication    B12 deficiency    Back pain    Bilateral adrenal adenomas    BPH (benign prostatic hyperplasia)    Carpal tunnel syndrome    Celiac artery atherosclerosis    Coronary artery disease    a.) MV 02/26/2005: no ischemia; b.) MV 11/24/2010: mild inferosep ischemia; c.) LHC/PCI 12/08/2010: 99% pLAD --> rotational athrectomy + 3.0 x 23 mm Xience DES x 1; d.) MV 02/15/2011: no ischemia; e.) MV 02/14/2018: no ischemia   Daily consumption of alcohol    Degenerative joint disease    DOE (dyspnea on exertion)    Duodenitis    Dyspnea on exertion    Erectile dysfunction    a.) on PDE5i (tadalafil )   GERD (gastroesophageal reflux disease)    H/O heart artery stent    Hepatic steatosis    History of shingles    History of tobacco abuse    HPV in male    Hx of adenomatous colonic polyps    Hyperlipidemia    Hypertension    Hyponatremia    Iliac artery aneurysm    Long term (current) use of aspirin     Long term current use of immunosuppressive drug    a.) Tx'd with azathioprine + hydroxychloroquine for SLE   Lumbar stenosis    MGUS (monoclonal gammopathy of unknown significance)    Multiple pulmonary nodules determined by computed tomography of lung    Neuroendocrine carcinoma of stomach (HCC) 2023   Nocturia    Nodule of lower lobe of right lung 06/2023    Paresthesia of left lower extremity    Peripheral vascular disease    Renal cell carcinoma, left (HCC)    Right lower lobe pulmonary nodule 06/20/2021   a.) Dotatate PET 06/20/2021: posterior RLL 0.6 x 0.5 mm: b.) LDCT chest 07/27/2022: 0.8 x 0.6 mm; c.) Dotatate PET 02/04/2023: 0.7 x 0.7 mm; d.) CT chest 05/13/2023: 8 x 11 mm; e.) super D chest CT 07/17/2023: spiclated morphology 9 x 8 mm; f.) CT chest 12/09/2023: cavitary spiculated nodule 10 x 7 mm   Rotator cuff tendonitis    Shingles    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)    a.) Tx'd with azathioprine + hydroxychloroquine   Urinary frequency     Past Surgical History:  Procedure Laterality Date   BACK SURGERY     BIOPSY  08/28/2021   Procedure: BIOPSY;  Surgeon: Wilhelmenia Aloha Raddle., MD;  Location: [facility];  Service: Gastroenterology;;   BRONCHIAL BIOPSY  07/18/2023   Procedure: BRONCHIAL BIOPSIES;  Surgeon: Isadora Hose, MD;  Location: [facility];  Service: Pulmonary;;   BRONCHIAL BIOPSY Right    BRONCHIAL BIOPSY Right 12/31/2023   BRONCHIAL NEEDLE ASPIRATION BIOPSY  07/18/2023   Procedure: BRONCHIAL NEEDLE ASPIRATION BIOPSIES;  Surgeon: Isadora Hose, MD;  Location:  [facility];  Service: Pulmonary;;   BRONCHIAL WASHINGS  07/18/2023   Procedure: BRONCHIAL WASHINGS;  Surgeon: Isadora Hose, MD;  Location: [facility];  Service: Pulmonary;;   BROW LIFT Bilateral 01/27/2021   Procedure: BLEPHAROPLASTY UPPER EYELID; W/EXCESS SKIN BROW PTOSIS REPAIR AND BLEPHAROPTOSIS REPAIR; RESECT EX BILATERAL;  Surgeon: Ashley Greig HERO, MD;  Location: [facility];  Service: Ophthalmology;  Laterality: Bilateral;   CATARACT EXTRACTION W/PHACO Right 08/19/2024   Procedure: PHACOEMULSIFICATION, CATARACT, WITH IOL INSERTION 7.83, 00:36.0;  Surgeon: Mittie Gaskin, MD;  Location: [facility];  Service: Ophthalmology;  Laterality: Right;   COLONOSCOPY     COLONOSCOPY WITH PROPOFOL  N/A 09/30/2019    Procedure: COLONOSCOPY WITH PROPOFOL ;  Surgeon: Dessa Reyes ORN, MD;  Location: [facility];  Service: Endoscopy;  Laterality: N/A;   COLONOSCOPY WITH PROPOFOL  N/A 05/05/2021   Procedure: COLONOSCOPY WITH PROPOFOL ;  Surgeon: Maryruth Ole DASEN, MD;  Location: [facility];  Service: Endoscopy;  Laterality: N/A;   CORONARY ANGIOPLASTY WITH STENT PLACEMENT Left 12/08/2010   Procedure: CORONARY ANGIOPLASTY WITH STENT PLACEMENT (pLAD); Procedure: [facility]   ESOPHAGOGASTRODUODENOSCOPY     ESOPHAGOGASTRODUODENOSCOPY (EGD) WITH PROPOFOL  N/A 05/05/2021   Procedure: ESOPHAGOGASTRODUODENOSCOPY (EGD) WITH PROPOFOL ;  Surgeon: Maryruth Ole DASEN, MD;  Location: [facility];  Service: Endoscopy;  Laterality: N/A;   ESOPHAGOGASTRODUODENOSCOPY (EGD) WITH PROPOFOL  N/A 08/28/2021   Procedure: ESOPHAGOGASTRODUODENOSCOPY (EGD) WITH PROPOFOL ;  Surgeon: Wilhelmenia Aloha Raddle., MD;  Location: [facility];  Service: Gastroenterology;  Laterality: N/A;   ESOPHAGOGASTRODUODENOSCOPY (EGD) WITH PROPOFOL  N/A 08/17/2022   Procedure: ESOPHAGOGASTRODUODENOSCOPY (EGD) WITH PROPOFOL ;  Surgeon: Maryruth Ole DASEN, MD;  Location: [facility];  Service: Endoscopy;  Laterality: N/A;   EUS N/A 08/28/2021   Procedure: UPPER ENDOSCOPIC ULTRASOUND (EUS) RADIAL;  Surgeon: Wilhelmenia Aloha Raddle., MD;  Location: [facility];  Service: Gastroenterology;  Laterality: N/A;   EXCISION OF SKIN TAG N/A 03/06/2024   Procedure: EXCISION, SKIN TAG;  Surgeon: Rodolph Romano, MD;  Location: [facility];  Service: General;  Laterality: N/A;   LOWER EXTREMITY ANGIOGRAPHY Left 01/10/2024   Procedure: Lower Extremity Angiography;  Surgeon: Jama Cordella MATSU, MD;  Location: [facility];  Service: Cardiovascular;  Laterality: Left;   LUMBAR LAMINECTOMY/DECOMPRESSION MICRODISCECTOMY Left 02/03/2015   Procedure: LUMBAR LAMINECTOMY/DECOMPRESSION MICRODISCECTOMY LEFT LUMBAR FOUR-FIVE WITH REMOVAL OF SYNOVIAL CYST;  Surgeon: Reyes Budge, MD;  Location: [facility];  Service: Neurosurgery;  Laterality: Left;   NASAL SEPTOPLASTY W/ TURBINOPLASTY Bilateral 10/23/2019   Procedure: NASAL SEPTOPLASTY WITH SUBMUCOUS RESECTION OF TURBINATE;  Surgeon: Herminio Miu, MD;  [facility];  Service: ENT;  Laterality: Bilateral;   PELVIC ANGIOGRAPHY N/A 05/02/2018   Procedure: PELVIC ANGIOGRAPHY;  Surgeon: Jama Cordella MATSU, MD;  Location: [facility];  Service: Cardiovascular;  Laterality: N/A;   POLYPECTOMY  08/28/2021   Procedure: POLYPECTOMY;  Surgeon: Wilhelmenia Aloha Raddle., MD;  Location: [facility];  Service: Gastroenterology;;   RECTAL EXAM UNDER ANESTHESIA N/A 03/06/2024   Procedure: ERASMO UNDER ANESTHESIA, RECTUM;  Surgeon: Rodolph Romano, MD;  Location: [facility];  Service: General;  Laterality: N/A;   SHOULDER ARTHROSCOPY WITH SUBACROMIAL DECOMPRESSION AND BICEP TENDON REPAIR Right 12/04/2018   Procedure: SHOULDER ARTHROSCOPY WITH DEBRIDEMENT, DECOMPRESSION, BICEP TENODESIS;  Surgeon: Edie Norleen PARAS, MD;  Location: [facility];  Service: Orthopedics;  Laterality: Right;   SUBMUCOSAL INJECTION  08/28/2021   Procedure: SUBMUCOSAL INJECTION;  Surgeon: Wilhelmenia Aloha Raddle., MD;  Location: [facility];  Service: Gastroenterology;;    Prior to Admission medications   Medication Sig Start  Date End Date Taking? Authorizing Provider  ALPRAZolam  (XANAX ) 0.5 MG tablet Take 0.5 mg by mouth 3 (three) times daily as needed for anxiety.    Yes [provider]  amLODipine (NORVASC) 2.5 MG tablet Take 5 mg by mouth daily.   Yes [provider]  aspirin  EC 81 MG tablet Take 81 mg by mouth daily.   Yes [provider]  azaTHIOprine (IMURAN) 50 MG tablet Take 125 mg by mouth daily.    Yes [provider]  cetirizine (ZYRTEC) 10 MG tablet Take 10 mg by mouth daily.   Yes [provider]  finasteride  (PROSCAR ) 5 MG tablet Take 1 tablet (5 mg total) by mouth  daily. 02/25/24  Yes Francisca Redell BROCKS, MD  hydrochlorothiazide  (HYDRODIURIL ) 12.5 MG tablet Take 12.5 mg by mouth daily.   Yes [provider]  hydroxychloroquine (PLAQUENIL) 200 MG tablet Take 200 mg by mouth 2 (two) times daily.   Yes [provider]  metoprolol  succinate (TOPROL -XL) 50 MG 24 hr tablet Take 50 mg by mouth at bedtime. Take with or immediately following a meal.   Yes [provider]  pantoprazole  (PROTONIX ) 20 MG tablet Take 1 tablet (20 mg total) by mouth daily. 01/11/24 01/10/25 Yes Schnier, Cordella MATSU, MD  pravastatin  (PRAVACHOL ) 40 MG tablet Take 40 mg by mouth at bedtime.   Yes [provider]  Probiotic Product (PROBIOTIC DAILY PO) Take 1 capsule by mouth daily.   Yes [provider]  tamsulosin  (FLOMAX ) 0.4 MG CAPS capsule Take 1 capsule (0.4 mg total) by mouth daily after breakfast. 02/25/24  Yes Francisca Redell BROCKS, MD  traMADol  (ULTRAM ) 50 MG tablet Take 1 tablet (50 mg total) by mouth every 6 (six) hours as needed. 03/06/24 03/06/25 Yes Rodolph Romano, MD  triamcinolone (NASACORT) 55 MCG/ACT AERO nasal inhaler Place 1 spray into the nose daily as needed (allergies).   Yes [provider]  clopidogrel  (PLAVIX ) 75 MG tablet Take 1 tablet (75 mg total) by mouth daily. Patient not taking: Reported on 08/19/2024 01/11/24   Schnier, Cordella MATSU, MD  cyanocobalamin  (,VITAMIN B-12,) 1000 MCG/ML injection Inject 1,000 mcg into the muscle every 14 (fourteen) days.  Patient not taking: Reported on 03/06/2024    [provider]  ELDERBERRY PO Take 2 tablets by mouth daily. Patient not taking: Reported on 03/06/2024    [provider]  ibuprofen (ADVIL,MOTRIN) 200 MG tablet Take 400 mg by mouth every 8 (eight) hours as needed for headache or moderate pain.    [provider]  nicotine (NICODERM CQ - DOSED IN MG/24 HOURS) 21 mg/24hr patch Place 21 mg onto the skin daily.    [provider]  nitroGLYCERIN  (NITROLINGUAL) 0.4 MG/SPRAY spray Place 1 spray under the tongue every 5 (five) minutes x 3 doses as needed for chest pain.    [provider]  spironolactone (ALDACTONE) 25 MG tablet Take 25 mg by mouth daily.    [provider]  tadalafil  (CIALIS ) 20 MG tablet Take 1 tablet (20 mg total) by mouth daily as needed for erectile dysfunction. 02/14/23   Francisca Redell BROCKS, MD    Allergies as of 07/24/2024 - Review Complete 03/06/2024  Allergen Reaction Noted   Fosamax [alendronate sodium]  01/24/2015   Percocet [oxycodone -acetaminophen ] Nausea And Vomiting 10/15/2019    Family History  Problem Relation Age of Onset   Leukemia Mother        dx 39s   Hyperlipidemia Father    Hypertension  Father    Diabetes Father    Colon cancer Father        dx 74s   Hearing loss Father    Vascular Disease Father    Lung cancer Brother        d. 33    Social History   Socioeconomic History   Marital status: Married    Spouse name: BRADFORD, CAZIER (Spouse) [phone] (Home Phone)   Number of children: Not on file   Years of education: Not on file   Highest education level: Not on file  Occupational History   Not on file  Tobacco Use   Smoking status: Former    Current packs/day: 0.00    Average packs/day: 1.5 packs/day for 30.0 years (45.0 ttl pk-yrs)    Types: Cigarettes    Start date: 04/01/1991    Quit date: 03/31/2021    Years since quitting: 3.4   Smokeless tobacco: Never  Vaping Use   Vaping status: Never Used  Substance and Sexual Activity   Alcohol use: Yes    Alcohol/week: 14.0 standard drinks of alcohol    Types: 14 Standard drinks or equivalent per week    Comment: cocktails about 2 daily   Drug use: No   Sexual activity: Yes  Other Topics Concern   Not on file  Social History Narrative   Not on file   Social Drivers of Health   Financial Resource Strain: Low Risk  (02/17/2024)   [facility]   Overall Financial Resource  Strain (CARDIA)    Difficulty of Paying Living Expenses: Not hard at all  Food Insecurity: No Food Insecurity (02/17/2024)   Received from [facility]   Hunger Vital Sign    Within the past 12 months, you worried that your food would run out before you got the money to buy more.: Never true    Within the past 12 months, the food you bought just didn't last and you didn't have money to get more.: Never true  Transportation Needs: No Transportation Needs (02/17/2024)   Received [facility]    In the past 12 months, has lack of transportation kept you from medical appointments or from getting medications?: No    Lack of Transportation (Non-Medical): No  Physical Activity: Not on file  Stress: Not on file  Social Connections: Not on file  Intimate Partner Violence: Not on file    Review of Systems: See HPI, otherwise negative ROS  Physical Exam: BP 137/82   Pulse 78   Temp 98.3 F (36.8 C) (Temporal)   Resp 13   Ht 6' (1.829 m)   Wt 86.9 kg   SpO2 97%   BMI 25.99 kg/m  General:   Alert,  pleasant and cooperative in NAD Head:  Normocephalic and atraumatic. Lungs:  Clear to auscultation.    Heart:  Regular rate and rhythm.   Impression/Plan: Lynwood DELENA KENNYTH is here for ophthalmic surgery.  Risks, benefits, limitations, and alternatives regarding ophthalmic surgery have been reviewed with the patient.  Questions have been answered.  All parties agreeable.   MITTIE GASKIN, MD  09/02/2024, 10:16 AM\

## 2024-09-09 ENCOUNTER — Other Ambulatory Visit: Payer: Self-pay

## 2024-09-09 ENCOUNTER — Ambulatory Visit (INDEPENDENT_AMBULATORY_CARE_PROVIDER_SITE_OTHER): Payer: PRIVATE HEALTH INSURANCE | Admitting: Urology

## 2024-09-09 VITALS — BP 128/81 | HR 87 | Ht 72.0 in | Wt 191.0 lb

## 2024-09-09 DIAGNOSIS — N2889 Other specified disorders of kidney and ureter: Secondary | ICD-10-CM

## 2024-09-09 DIAGNOSIS — N529 Male erectile dysfunction, unspecified: Secondary | ICD-10-CM

## 2024-09-09 DIAGNOSIS — N401 Enlarged prostate with lower urinary tract symptoms: Secondary | ICD-10-CM

## 2024-09-09 DIAGNOSIS — N138 Other obstructive and reflux uropathy: Secondary | ICD-10-CM

## 2024-09-09 MED ORDER — TADALAFIL 20 MG PO TABS: 20.0000 mg | ORAL_TABLET | Freq: Every day | ORAL | 6 refills | Status: AC | PRN

## 2024-09-09 MED ORDER — FINASTERIDE 5 MG PO TABS: 5.0000 mg | ORAL_TABLET | Freq: Every day | ORAL | 3 refills | Status: AC

## 2024-09-09 MED ORDER — TAMSULOSIN HCL 0.4 MG PO CAPS: 0.4000 mg | ORAL_CAPSULE | Freq: Every day | ORAL | 11 refills | Status: AC

## 2024-09-09 NOTE — Progress Notes (Signed)
   09/09/2024 12:02 PM   YASSEN KINNETT 24-Jul-1951 [id number]  Reason for visit: Follow up enlarging renal mass, BPH, microscopic hematuria, ED, PSA screening, penile warts  History: Negative microscopic hematuria workup 2022, normal renal ultrasound and cystoscopy PSA has been normal, corrected for finasteride  0.74 February 2025 Maximal medical therapy for BPH with Flomax  and finasteride  Cialis  20 mg on demand for ED Penile warts previously treated by dermatology and [facility] New finding in April 2025 of a 1.3 cm enhancing left renal mass and opted for active surveillance  Physical Exam: BP 128/81 (BP Location: Left Arm, Patient Position: Sitting, Cuff Size: Normal)   Pulse 87   Ht 6' (1.829 m)   Wt 191 lb (86.6 kg)   BMI 25.90 kg/m   Imaging/labs: I personally reviewed and interpreted the most recent MRI October 2025 showing enlarging left renal mass up to 2 cm from 1.3 prior, concerning for RCC. Renal function normal creatinine 0.8, eGFR greater than 60  Today: No major changes in urination, continues on Flomax  and finasteride  He is interested in definitive treatment for enlarging small renal mass  Plan:   BPH/LUTS: Continue Flomax  and finasteride , refilled PSA screening: Normal, reassurance provided regarding PSA level <1 and very low risk of mortality from prostate cancer Renal mass: Enlarging on most recent MRI, fairly significant growth in only 6 months.  Discussed options including surveillance, percutaneous biopsy ablation, partial nephrectomy, radical nephrectomy.  Pros and cons reviewed extensively, he prefers the least invasive treatment, and I think percutaneous biopsy and ablation is very reasonable based on small tumor size and growth over the last 6 months Referral placed to IR to consider biopsy and percutaneous ablation of 2 cm enlarging, enhancing left renal mass RTC 6 months follow-up and PVR   Redell JAYSON Burnet, MD  [facility] [address],  [address] [place], [place] [address] [phone]

## 2024-09-09 NOTE — Patient Instructions (Signed)
 A Growth in the Kidney (Renal Mass): What to Know  A renal mass is an abnormal growth in the kidney. It may be found during an MRI, CT scan, or ultrasound that's done to check for other problems in the belly. Some renal masses are cancerous, or malignant, and can grow or spread quickly. Others are benign, which means they're not cancer. Renal masses include: Tumors. These may be either cancerous or benign. The most common cancerous tumor in adults is called renal cell carcinoma. In children, the most common type is Wilms tumor. The most common kidney tumors that aren't cancer include renal adenomas, oncocytomas, and angiomyolipomas (AML). Cysts. These are pockets of fluid that form on or in the kidney. What are the causes? Certain types of cancers, infections, or injuries can cause a renal mass. It's not always known what causes a cyst to form in or on the kidney. What are the signs or symptoms? Often, a renal mass or kidney cyst doesn't cause any signs or symptoms. How is this diagnosed? Your health care provider may suggest tests to diagnose the cause of your renal mass. These tests may include: A physical exam. Blood tests. Pee (urine) tests. Imaging tests, such as: CT scan. MRI. Ultrasound. Chest X-ray or bone scan. These may be done if a tumor is cancerous to see if the cancer has spread outside the kidney. Biopsy. This is when a small piece of tissue is removed from the renal mass for testing. How is this treated? Treatment is not always needed for a renal mass. Treatment will depend on the cause of the mass and if it's causing any problems or symptoms. For a cancerous renal mass, treatment options may include: Surgery. This is done to remove the tumor and any affected tissue. Chemotherapy. This uses medicines to kill cancer cells. Radiation. High-energy X-rays or gamma rays are used to kill cancer cells. Ablation. This uses extreme hot or cold temperature to kill the cancer  cells. Immunotherapy. Medicines are used to help the body's defense system (immune system) fight the cancer cells. Taking part in clinical trials. This involves trying new or experimental treatments to see if they're effective. Most kidney cysts don't need to be treated. Follow these instructions at home: What you need to do at home will depend on the cause of the mass. Follow the instructions that your provider gives you. In general: Take medicines only as told. If you were given antibiotics, take them as told. Do not stop taking them even if you start to feel better. Follow any instructions from your provider about what you can and can't do. Do not smoke, vape, or use nicotine or tobacco. Keep all follow-up visits. Your provider will need to check if your renal mass has changed or grown. Contact a health care provider if: You have flank pain, which is pain in your side or back. You have a fever. You have a loss of appetite. You have pain or swelling in your belly. You lose weight. Get help right away if: Your pain gets worse. There's blood in your pee. You can't pee. This information is not intended to replace advice given to you by your health care provider. Make sure you discuss any questions you have with your health care provider. Document Revised: 04/26/2023 Document Reviewed: 04/26/2023 Elsevier Patient Education  2025 [employer].

## 2024-09-09 NOTE — Progress Notes (Signed)
 Placed referral for IR consult for patient.

## 2024-09-10 ENCOUNTER — Ambulatory Visit: Payer: PRIVATE HEALTH INSURANCE | Admitting: Urology

## 2024-09-17 ENCOUNTER — Ambulatory Visit
Admission: RE | Admit: 2024-09-17 | Discharge: 2024-09-17 | Disposition: A | Payer: PRIVATE HEALTH INSURANCE | Source: Ambulatory Visit | Attending: Urology

## 2024-09-17 DIAGNOSIS — N2889 Other specified disorders of kidney and ureter: Secondary | ICD-10-CM

## 2024-09-17 HISTORY — PX: IR RADIOLOGIST EVAL & MGMT: IMG5224

## 2024-09-17 NOTE — Consult Note (Signed)
 Chief Complaint: Patient was seen in consultation today for No chief complaint on file.  at the request of Sninsky,Brian C  Referring Physician(s): Sninsky,Brian C  History of Present Illness: Sergio Callahan is a 73 y.o. male with a history of an incidentally identified 1.3 cm enhancing mass arising from the left kidney in April 2025.  He initially elected for surveillance and a follow-up MRI was performed 6 months later in October.  The mass had significantly increased in size to 2.0 cm.  These findings are concerning for renal cell carcinoma.  His renal function is good with a normal creatinine and GFR.  He is under the care of Dr. Quilla with urology.  After discussing all treatment options, it was decided that he was not an optimal candidate for surgical resection and would be a better candidate for minimally invasive percutaneous ablation and biopsy.  I spoke with Sergio Callahan at length today.  He remains clinically asymptomatic.  He has no flank pain, hematuria, unintentional weight loss or other clinical symptoms.  His performance status is excellent.  MRI Abd 08/17/24 1. Interval increase in the size of left kidney upper pole enhancing lesion, compatible with renal cell carcinoma. No evidence of metastatic disease within the abdomen.  Past Medical History:  Diagnosis Date   AAA (abdominal aortic aneurysm)    Abdominal aortic aneurysm without rupture    Adenomatous colon polyp    Allergic rhinitis    Anemia    Anogenital (venereal) warts    Anxiety    a.) on BZO PRN (alprazolam )   Aortic atherosclerosis    Aortic atherosclerosis    Arthritis    Atherosclerosis of native artery of right lower extremity with intermittent claudication    B12 deficiency    Back pain    Bilateral adrenal adenomas    BPH (benign prostatic hyperplasia)    Carpal tunnel syndrome    Celiac artery atherosclerosis    Coronary artery disease    a.) MV 02/26/2005: no ischemia; b.) MV  11/24/2010: mild inferosep ischemia; c.) LHC/PCI 12/08/2010: 99% pLAD --> rotational athrectomy + 3.0 x 23 mm Xience DES x 1; d.) MV 02/15/2011: no ischemia; e.) MV 02/14/2018: no ischemia   Daily consumption of alcohol    Degenerative joint disease    DOE (dyspnea on exertion)    Duodenitis    Dyspnea on exertion    Erectile dysfunction    a.) on PDE5i (tadalafil )   GERD (gastroesophageal reflux disease)    H/O heart artery stent    Hepatic steatosis    History of shingles    History of tobacco abuse    HPV in male    Hx of adenomatous colonic polyps    Hyperlipidemia    Hypertension    Hyponatremia    Iliac artery aneurysm    Long term (current) use of aspirin     Long term current use of immunosuppressive drug    a.) Tx'd with azathioprine + hydroxychloroquine for SLE   Lumbar stenosis    MGUS (monoclonal gammopathy of unknown significance)    Multiple pulmonary nodules determined by computed tomography of lung    Neuroendocrine carcinoma of stomach (HCC) 2023   Nocturia    Nodule of lower lobe of right lung 06/2023   Paresthesia of left lower extremity    Peripheral vascular disease    Renal cell carcinoma, left (HCC)    Right lower lobe pulmonary nodule 06/20/2021   a.) Dotatate PET 06/20/2021: posterior RLL  0.6 x 0.5 mm: b.) LDCT chest 07/27/2022: 0.8 x 0.6 mm; c.) Dotatate PET 02/04/2023: 0.7 x 0.7 mm; d.) CT chest 05/13/2023: 8 x 11 mm; e.) super D chest CT 07/17/2023: spiclated morphology 9 x 8 mm; f.) CT chest 12/09/2023: cavitary spiculated nodule 10 x 7 mm   Rotator cuff tendonitis    Shingles    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)    a.) Tx'd with azathioprine + hydroxychloroquine   Urinary frequency     Past Surgical History:  Procedure Laterality Date   BACK SURGERY     BIOPSY  08/28/2021   Procedure: BIOPSY;  Surgeon: Wilhelmenia Aloha Raddle., MD;  Location: [facility];  Service: Gastroenterology;;   BRONCHIAL BIOPSY  07/18/2023    Procedure: BRONCHIAL BIOPSIES;  Surgeon: Isadora Hose, MD;  Location: [facility];  Service: Pulmonary;;   BRONCHIAL BIOPSY Right    BRONCHIAL BIOPSY Right 12/31/2023   BRONCHIAL NEEDLE ASPIRATION BIOPSY  07/18/2023   Procedure: BRONCHIAL NEEDLE ASPIRATION BIOPSIES;  Surgeon: Isadora Hose, MD;  Location: [facility];  Service: Pulmonary;;   BRONCHIAL WASHINGS  07/18/2023   Procedure: BRONCHIAL WASHINGS;  Surgeon: Isadora Hose, MD;  Location: [facility];  Service: Pulmonary;;   BROW LIFT Bilateral 01/27/2021   Procedure: BLEPHAROPLASTY UPPER EYELID; W/EXCESS SKIN BROW PTOSIS REPAIR AND BLEPHAROPTOSIS REPAIR; RESECT EX BILATERAL;  Surgeon: Ashley Greig HERO, MD;  Location: [facility];  Service: Ophthalmology;  Laterality: Bilateral;   CATARACT EXTRACTION W/PHACO Right 08/19/2024   Procedure: PHACOEMULSIFICATION, CATARACT, WITH IOL INSERTION 7.83, 00:36.0;  Surgeon: Mittie Gaskin, MD;  Location: [facility];  Service: Ophthalmology;  Laterality: Right;   CATARACT EXTRACTION W/PHACO Left 09/02/2024   Procedure: PHACOEMULSIFICATION, CATARACT, WITH IOL INSERTION 4.60 00:41.3;  Surgeon: Mittie Gaskin, MD;  Location: [facility];  Service: Ophthalmology;  Laterality: Left;   COLONOSCOPY     COLONOSCOPY WITH PROPOFOL  N/A 09/30/2019   Procedure: COLONOSCOPY WITH PROPOFOL ;  Surgeon: Dessa Reyes ORN, MD;  Location: [facility];  Service: Endoscopy;  Laterality: N/A;   COLONOSCOPY WITH PROPOFOL  N/A 05/05/2021   Procedure: COLONOSCOPY WITH PROPOFOL ;  Surgeon: Maryruth Ole DASEN, MD;  Location: [facility];  Service: Endoscopy;  Laterality: N/A;   CORONARY ANGIOPLASTY WITH STENT PLACEMENT Left 12/08/2010   Procedure: CORONARY ANGIOPLASTY WITH STENT PLACEMENT (pLAD); Procedure: [facility]   ESOPHAGOGASTRODUODENOSCOPY     ESOPHAGOGASTRODUODENOSCOPY (EGD) WITH PROPOFOL  N/A 05/05/2021   Procedure: ESOPHAGOGASTRODUODENOSCOPY (EGD) WITH PROPOFOL ;  Surgeon: Maryruth Ole DASEN, MD;  Location: [facility];  Service: Endoscopy;  Laterality: N/A;   ESOPHAGOGASTRODUODENOSCOPY (EGD) WITH PROPOFOL  N/A 08/28/2021   Procedure: ESOPHAGOGASTRODUODENOSCOPY (EGD) WITH PROPOFOL ;  Surgeon: Wilhelmenia Aloha Raddle., MD;  Location: [facility];  Service: Gastroenterology;  Laterality: N/A;   ESOPHAGOGASTRODUODENOSCOPY (EGD) WITH PROPOFOL  N/A 08/17/2022   Procedure: ESOPHAGOGASTRODUODENOSCOPY (EGD) WITH PROPOFOL ;  Surgeon: Maryruth Ole DASEN, MD;  Location: [facility];  Service: Endoscopy;  Laterality: N/A;   EUS N/A 08/28/2021   Procedure: UPPER ENDOSCOPIC ULTRASOUND (EUS) RADIAL;  Surgeon: Wilhelmenia Aloha Raddle., MD;  Location: [facility];  Service: Gastroenterology;  Laterality: N/A;   EXCISION OF SKIN TAG N/A 03/06/2024   Procedure: EXCISION, SKIN TAG;  Surgeon: Rodolph Romano, MD;  Location: [facility];  Service: General;  Laterality: N/A;   IR RADIOLOGIST EVAL & MGMT  09/17/2024   LOWER EXTREMITY ANGIOGRAPHY Left 01/10/2024   Procedure: Lower Extremity Angiography;  Surgeon: Jama Cordella MATSU, MD;  Location: [facility];  Service: Cardiovascular;  Laterality: Left;   LUMBAR LAMINECTOMY/DECOMPRESSION MICRODISCECTOMY Left  02/03/2015   Procedure: LUMBAR LAMINECTOMY/DECOMPRESSION MICRODISCECTOMY LEFT LUMBAR FOUR-FIVE WITH REMOVAL OF SYNOVIAL CYST;  Surgeon: Reyes Budge, MD;  Location: [facility];  Service: Neurosurgery;  Laterality: Left;   NASAL SEPTOPLASTY W/ TURBINOPLASTY Bilateral 10/23/2019   Procedure: NASAL SEPTOPLASTY WITH SUBMUCOUS RESECTION OF TURBINATE;  Surgeon: Herminio Miu, MD;  Location: [facility];  Service: ENT;  Laterality: Bilateral;   PELVIC ANGIOGRAPHY N/A 05/02/2018   Procedure: PELVIC ANGIOGRAPHY;  Surgeon: Jama Cordella MATSU, MD;  Location: [facility];  Service: Cardiovascular;  Laterality: N/A;   POLYPECTOMY  08/28/2021   Procedure: POLYPECTOMY;  Surgeon: Wilhelmenia Aloha Raddle., MD;  Location: [facility];  Service: Gastroenterology;;   RECTAL EXAM UNDER ANESTHESIA N/A 03/06/2024   Procedure: ERASMO UNDER ANESTHESIA, RECTUM;  Surgeon: Rodolph Romano, MD;  Location: [facility];  Service: General;  Laterality: N/A;   SHOULDER ARTHROSCOPY WITH SUBACROMIAL DECOMPRESSION AND BICEP TENDON REPAIR Right 12/04/2018   Procedure: SHOULDER ARTHROSCOPY WITH DEBRIDEMENT, DECOMPRESSION, BICEP TENODESIS;  Surgeon: Edie Norleen PARAS, MD;  Location: [facility];  Service: Orthopedics;  Laterality: Right;   SUBMUCOSAL INJECTION  08/28/2021   Procedure: SUBMUCOSAL INJECTION;  Surgeon: Wilhelmenia Aloha Raddle., MD;  Location: [facility];  Service: Gastroenterology;;    Allergies: Fosamax [alendronate sodium] and Percocet [oxycodone -acetaminophen ]  Medications: Prior to Admission medications   Medication Sig Start Date End Date Taking? Authorizing Provider  ALPRAZolam  (XANAX ) 0.5 MG tablet Take 0.5 mg by mouth 3 (three) times daily as needed for anxiety.     [provider]  amLODipine (NORVASC) 2.5 MG tablet Take 5 mg by mouth daily.    [provider]  aspirin  EC 81 MG tablet Take 81 mg by mouth daily.    [provider]  azaTHIOprine (IMURAN) 50 MG tablet Take 125 mg by mouth daily.     [provider]  cyanocobalamin  (,VITAMIN B-12,) 1000 MCG/ML injection Inject 1,000 mcg into the muscle every 14 (fourteen) days.     [provider]  finasteride  (PROSCAR ) 5 MG tablet Take 1 tablet (5 mg total) by mouth daily. 09/09/24   Francisca Redell BROCKS, MD  hydrochlorothiazide  (HYDRODIURIL ) 12.5 MG tablet Take 12.5 mg by mouth daily.    [provider]  hydroxychloroquine (PLAQUENIL) 200 MG tablet Take 200 mg by mouth 2 (two) times daily.    [provider]  ibuprofen (ADVIL,MOTRIN) 200 MG tablet Take 400 mg by mouth every 8 (eight) hours as needed for headache or moderate pain.    [provider]  metoprolol  succinate (TOPROL -XL) 50 MG 24 hr tablet  Take 50 mg by mouth at bedtime. Take with or immediately following a meal.    [provider]  nicotine (NICODERM CQ - DOSED IN MG/24 HOURS) 21 mg/24hr patch Place 21 mg onto the skin daily.    [provider]  nitroGLYCERIN (NITROLINGUAL) 0.4 MG/SPRAY spray Place 1 spray under the tongue every 5 (five) minutes x 3 doses as needed for chest pain.    [provider]  pantoprazole  (PROTONIX ) 20 MG tablet Take 1 tablet (20 mg total) by mouth daily. 01/11/24 01/10/25  Schnier, Cordella MATSU, MD  pravastatin  (PRAVACHOL ) 40 MG tablet Take 40 mg by mouth at bedtime.    [provider]  spironolactone (ALDACTONE) 25 MG tablet Take 25 mg by mouth daily.    [provider]  tadalafil  (CIALIS ) 20 MG tablet Take 1 tablet (20 mg total) by mouth daily as needed for erectile dysfunction. 09/09/24   Francisca Redell  C, MD  tamsulosin  (FLOMAX ) 0.4 MG CAPS capsule Take 1 capsule (0.4 mg total) by mouth daily after breakfast. 09/09/24   Francisca Redell BROCKS, MD     Family History  Problem Relation Age of Onset   Leukemia Mother        dx 36s   Hyperlipidemia Father    Hypertension Father    Diabetes Father    Colon cancer Father        dx 69s   Hearing loss Father    Vascular Disease Father    Lung cancer Brother        d. 37    Social History   Socioeconomic History   Marital status: Married    Spouse name: DEONDREA, MARKOS (Spouse) [phone] (Home Phone)   Number of children: Not on file   Years of education: Not on file   Highest education level: Not on file  Occupational History   Not on file  Tobacco Use   Smoking status: Former    Current packs/day: 0.00    Average packs/day: 1.5 packs/day for 30.0 years (45.0 ttl pk-yrs)    Types: Cigarettes    Start date: 04/01/1991    Quit date: 03/31/2021    Years since quitting: 3.4   Smokeless tobacco: Never  Vaping Use   Vaping status: Never Used  Substance and Sexual Activity   Alcohol use: Yes     Alcohol/week: 14.0 standard drinks of alcohol    Types: 14 Standard drinks or equivalent per week    Comment: cocktails about 2 daily   Drug use: No   Sexual activity: Yes  Other Topics Concern   Not on file  Social History Narrative   Not on file   Social Drivers of Health   Financial Resource Strain: Low Risk  (02/17/2024)   Received from [facility]   Overall Financial Resource Strain (CARDIA)    Difficulty of Paying Living Expenses: Not hard at all  Food Insecurity: No Food Insecurity (02/17/2024)   Received from [facility]   Hunger Vital Sign    Within the past 12 months, you worried that your food would run out before you got the money to buy more.: Never true    Within the past 12 months, the food you bought just didn't last and you didn't have money to get more.: Never true  Transportation Needs: No Transportation Needs (02/17/2024)   Received from [facility] - Transportation    In the past 12 months, has lack of transportation kept you from medical appointments or from getting medications?: No    Lack of Transportation (Non-Medical): No  Physical Activity: Not on file  Stress: Not on file  Social Connections: Not on file    ECOG Status: 0 - Asymptomatic  Review of Systems: A 12 point ROS discussed and pertinent positives are indicated in the HPI above.  All other systems are negative.  Review of Systems  Vital Signs: BP (!) 145/81 (BP Location: Right Arm, Patient Position: Sitting, Cuff Size: Normal)   Pulse 78   Temp 98.1 F (36.7 C) (Oral)   Resp 16   Wt 86.6 kg   SpO2 97%   BMI 25.90 kg/m   Advance Care Plan: The advanced care plan/surrogate decision maker was discussed at the time of visit and the patient did not wish to discuss or was not able to name a surrogate decision maker or provide an advance care plan.  Physical Exam Constitutional:      General: He is not in acute distress.     Appearance: Normal appearance. He is normal weight.  HENT:     Head: Normocephalic and atraumatic.  Eyes:     General: No scleral icterus. Cardiovascular:     Rate and Rhythm: Normal rate.  Pulmonary:     Effort: Pulmonary effort is normal.  Abdominal:     General: There is no distension.     Tenderness: There is no abdominal tenderness. There is no guarding.  Musculoskeletal:        General: No tenderness.  Skin:    General: Skin is warm and dry.  Neurological:     Mental Status: He is alert and oriented to person, place, and time.  Psychiatric:        Mood and Affect: Mood normal.        Behavior: Behavior normal.       Imaging: IR Radiologist Eval & Mgmt Result Date: 09/17/2024 EXAM: NEW PATIENT OFFICE VISIT CHIEF COMPLAINT: SEE NOTE IN EPIC HISTORY OF PRESENT ILLNESS: SEE NOTE IN EPIC REVIEW OF SYSTEMS: SEE NOTE IN EPIC PHYSICAL EXAMINATION: SEE NOTE IN EPIC ASSESSMENT AND PLAN: SEE NOTE IN EPIC Electronically Signed   By: Wilkie Lent M.D.   On: 09/17/2024 09:11    Labs:  CBC: Recent Labs    01/14/24 1023  WBC 5.1  HGB 15.3  HCT 44.3  PLT 235    COAGS: No results for input(s): INR, APTT in the last 8760 hours.  BMP: Recent Labs    01/10/24 0812 01/14/24 1023  NA  --  133*  K  --  3.9  CL  --  99  CO2  --  26  GLUCOSE  --  100*  BUN 14 14  CALCIUM  --  8.9  CREATININE 0.77 0.82  GFRNONAA >60 >60    LIVER FUNCTION TESTS: Recent Labs    01/14/24 1023  BILITOT 1.1  AST 30  ALT 32  ALKPHOS 57  PROT 7.0  ALBUMIN 3.7    TUMOR MARKERS: No results for input(s): AFPTM, CEA, CA199, CHROMGRNA in the last 8760 hours.  Assessment and Plan:  Very pleasant 73 year old gentleman with an enlarging enhancing solid lesion exophytic from the posterior upper pole of the left kidney.  Enlargement is quite significant, the mass has increased by approximately 194% in terms of volume over a 41-month period.  This is highly concerning for a  renal cell carcinoma with a greater than 80% probability.  Mr. Trinka is not an optimal candidate for surgical resection but he is an ideal candidate for percutaneous thermal ablation.  The lesion is exophytic, well-visualized and far from any critical structures.  We discussed the risks, benefits and alternatives to percutaneous thermal ablation at length and time was given for questions.  Mr. Gange is in favor of proceeding with treatment as soon as possible.  1.)  Please schedule for CT-guided biopsy and concurrent percutaneous microwave ablation at [facility] as soon as possible.  We will use general anesthesia.  Thank you for this interesting consult.  I greatly enjoyed meeting KHRISTIAN PHILLIPPI and look forward to participating in their care.  A copy of this report was sent to the requesting provider on this date.  Electronically Signed: Wilkie MARLA Lent 09/17/2024, 11:07 AM   I spent a total of 60 Minutes  in face to face in clinical consultation, greater than 50% of which was counseling/coordinating  care for left putative renal cell carcinoma

## 2024-09-21 ENCOUNTER — Other Ambulatory Visit: Payer: Self-pay | Admitting: Interventional Radiology

## 2024-09-21 DIAGNOSIS — N2889 Other specified disorders of kidney and ureter: Secondary | ICD-10-CM

## 2024-09-29 ENCOUNTER — Encounter
Admission: RE | Admit: 2024-09-29 | Discharge: 2024-09-29 | Disposition: A | Discharge status: Home / Self Care | Source: Ambulatory Visit | Attending: Interventional Radiology | Admitting: Interventional Radiology

## 2024-09-29 ENCOUNTER — Other Ambulatory Visit: Payer: Self-pay

## 2024-09-29 DIAGNOSIS — Z01810 Encounter for preprocedural cardiovascular examination: Secondary | ICD-10-CM

## 2024-09-29 DIAGNOSIS — Z01818 Encounter for other preprocedural examination: Secondary | ICD-10-CM | POA: Diagnosis not present

## 2024-09-29 DIAGNOSIS — I10 Essential (primary) hypertension: Secondary | ICD-10-CM

## 2024-09-29 DIAGNOSIS — Z01812 Encounter for preprocedural laboratory examination: Secondary | ICD-10-CM

## 2024-09-29 DIAGNOSIS — N2889 Other specified disorders of kidney and ureter: Secondary | ICD-10-CM

## 2024-09-29 HISTORY — DX: Other specified disorders of kidney and ureter: N28.89

## 2024-09-29 HISTORY — DX: Personal history of urinary calculi: Z87.442

## 2024-09-29 LAB — TYPE AND SCREEN
ABO/RH(D): O POS
Antibody Screen: NEGATIVE

## 2024-09-29 NOTE — Patient Instructions (Addendum)
 Your procedure is scheduled on: 10/09/24 - Friday Report to the Registration Desk on the 1st floor of the [facility]. To find out your arrival time, please call [phone] between 1PM - 3PM on: 10/08/24 - Thursday If your arrival time is 6:00 am, do not arrive before that time as the Medical Mall entrance doors do not open until 6:00 am.  REMEMBER: Instructions that are not followed completely may result in serious medical risk, up to and including death; or upon the discretion of your surgeon and anesthesiologist your surgery may need to be rescheduled.  Do not eat food or drink any liquids after midnight the night before surgery.  No gum chewing or hard candies.  One week prior to surgery: Stop Anti-inflammatories (NSAIDS) such as Advil, Aleve, Ibuprofen, Motrin, Naproxen, Naprosyn and Aspirin  based products such as Excedrin, Goody's Powder, BC Powder.  You may take Tylenol  if needed for pain up until the day of surgery.  Stop ANY OVER THE COUNTER supplements until after surgery.  HOLD tadalafil  (CIALIS ) 2 days prior to your procedure.   ON THE DAY OF SURGERY ONLY TAKE THESE MEDICATIONS WITH SIPS OF WATER :  ALPRAZolam  (XANAX ) if needed amLODipine (NORVASC)  azaTHIOprine (IMURAN)  hydroxychloroquine   pantoprazole  (PROTONIX )    No Alcohol for 24 hours before or after surgery.  No Smoking including e-cigarettes for 24 hours before surgery.  No chewable tobacco products for at least 6 hours before surgery.  No nicotine patches on the day of surgery.  Do not use any recreational drugs for at least a week (preferably 2 weeks) before your surgery.  Please be advised that the combination of cocaine and anesthesia may have negative outcomes, up to and including death. If you test positive for cocaine, your surgery will be cancelled.  On the morning of surgery brush your teeth with toothpaste and water , you may rinse your mouth with mouthwash if you wish. Do not swallow any  toothpaste or mouthwash.  Use CHG Soap or wipes as directed on instruction sheet.  Do not wear jewelry, make-up, hairpins, clips or nail polish.  For welded (permanent) jewelry: bracelets, anklets, waist bands, etc.  Please have this removed prior to surgery.  If it is not removed, there is a chance that hospital personnel will need to cut it off on the day of surgery.  Do not wear lotions, powders, or perfumes.   Do not shave body hair from the neck down 48 hours before surgery.  Contact lenses, hearing aids and dentures may not be worn into surgery.  Do not bring valuables to the hospital. [facility] is not responsible for any missing/lost belongings or valuables.   Notify your doctor if there is any change in your medical condition (cold, fever, infection).  Wear comfortable clothing (specific to your surgery type) to the hospital.  After surgery, you can help prevent lung complications by doing breathing exercises.  Take deep breaths and cough every 1-2 hours. Your doctor may order a device called an Incentive Spirometer to help you take deep breaths.  If you are being admitted to the hospital overnight, leave your suitcase in the car. After surgery it may be brought to your room.  In case of increased patient census, it may be necessary for you, the patient, to continue your postoperative care in the Same Day Surgery department.  If you are being discharged the day of surgery, you will not be allowed to drive home. You will need a responsible individual to  drive you home and stay with you for 24 hours after surgery.   If you are taking public transportation, you will need to have a responsible individual with you.  Please call the Pre-admissions Testing Dept. at [phone] if you have any questions about these instructions.  Surgery Visitation Policy:  Patients having surgery or a procedure may have two visitors.  Children under the age of 70 must have an adult with  them who is not the patient.  Inpatient Visitation:    Visiting hours are 7 a.m. to 8 p.m. Up to four visitors are allowed at one time in a patient room. The visitors may rotate out with other people during the day.  One visitor age 82 or older may stay with the patient overnight and must be in the room by 8 p.m.   Merchandiser, Retail to address health-related social needs:  [email or web address]                                                                                                             Preparing for Surgery with CHLORHEXIDINE  GLUCONATE (CHG) Soap  Chlorhexidine  Gluconate (CHG) Soap  o An antiseptic cleaner that kills germs and bonds with the skin to continue killing germs even after washing  o Used for showering the night before surgery and morning of surgery  Before surgery, you can play an important role by reducing the number of germs on your skin.  CHG (Chlorhexidine  gluconate) soap is an antiseptic cleanser which kills germs and bonds with the skin to continue killing germs even after washing.  Please do not use if you have an allergy to CHG or antibacterial soaps. If your skin becomes reddened/irritated stop using the CHG.  1. Shower the NIGHT BEFORE SURGERY with CHG soap.  2. If you choose to wash your hair, wash your hair first as usual with your normal shampoo.  3. After shampooing, rinse your hair and body thoroughly to remove the shampoo.  4. Use CHG as you would any other liquid soap. You can apply CHG directly to the skin and wash gently with a clean washcloth.  5. Apply the CHG soap to your body only from the neck down. Do not use on open wounds or open sores. Avoid contact with your eyes, ears, mouth, and genitals (private parts). Wash face and genitals (private parts) with your normal soap.  6. Wash thoroughly, paying special attention to the area where your surgery will be performed.  7. Thoroughly rinse your body with warm  water .  8. Do not shower/wash with your normal soap after using and rinsing off the CHG soap.  9. Do not use lotions, oils, etc., after showering with CHG.  10. Pat yourself dry with a clean towel.  11. Wear clean pajamas to bed the night before surgery.  12. Place clean sheets on your bed the night of your shower and do not sleep with pets.  13. Do not apply any deodorants/lotions/powders.  14. Please wear clean clothes to the hospital.  15. Remember  to brush your teeth with your regular toothpaste.

## 2024-09-30 NOTE — Progress Notes (Signed)
 Perioperative / Anesthesia Services  Pre-Admission Testing Clinical Review / Pre-Operative Anesthesia Consult  Date: 09/30/24  PATIENT DEMOGRAPHICS: Name: Sergio Callahan DOB: Sep 03, 1951 MRN:   [id number]  Note: Available PAT nursing documentation and vital signs have been reviewed. Clinical nursing staff has updated patient's PMH/PSHx, current medication list, and drug allergies/intolerances to ensure complete and comprehensive history available to assist care teams in MDM as it pertains to the aforementioned surgical procedure and anticipated anesthetic course. Extensive review of available clinical information personally performed. Nursing documentation reviewed. [place] PMH and PSHx updated with any diagnoses and/or procedures that I have knowledge of that may have been inadvertently omitted during his intake with the pre-admission testing department's nursing staff.  PLANNED SURGICAL PROCEDURE(S):   Date/Time: 10/09/24 0830   Procedures:      CT RENAL TUMOR ABLATION UNILATERAL     CT RENAL BIOPSY   Diagnosis: Left renal mass [N28.89]   Indications:      LT renal mass     LT renal mass   Location: [facility] CT [facility]        CLINICAL DISCUSSION: Sergio Callahan is a 73 y.o. male who is submitted for pre-surgical anesthesia review and clearance prior to him undergoing the above procedure. Patient is a Current Smoker (45 pack years). 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   Patient is followed by cardiology Philippe, MD). He was last seen in the cardiology clinic on  11/04/2023; notes reviewed. At the time of his clinic visit, patient doing well overall from a cardiovascular perspective. Patient denied any chest pain, shortness of breath, PND, orthopnea, palpitations, significant peripheral edema, weakness, fatigue, vertiginous symptoms, or presyncope/syncope. Patient with a past medical history significant for cardiovascular diagnoses. Documented physical exam was grossly benign, providing no evidence of acute exacerbation and/or decompensation of the patient's known cardiovascular conditions.  Patient underwent diagnostic LEFT heart catheterization on 12/08/2010 oh at [facility].  Study revealed significant single-vessel CAD, with a 99% lesion in the proximal LAD.  Rotational atherectomy was performed followed by PCI placing a 3.0 x 23 mm Xience DES x 1 to the proximal LAD lesion yielding excellent angiographic result and TIMI-3 flow.   Most recent myocardial perfusion imaging study was performed on 02/14/2018 revealing a normal left trickle systolic function with an EF of 54%.  There were no regional wall motion abnormalities.  SPECT images demonstrated no evidence of stress-induced myocardial ischemia or arrhythmia; no scintigraphic evidence of scar.  Left ventricular internal cavity size was normal throughout the study.  Study determined to be normal and low risk overall.  Patient remains on daily DAPT therapy using ASA + clopidogrel .  Patient reported compliant with therapy with no evidence reports of GI/GU related bleeding. Blood pressure well controlled at 124/72 mmHg on currently prescribed CCB (amlodipine), diuretic (HCTZ + spironolactone), and beta-blocker (metoprolol  succinate) therapies.  Patient is on pravastatin  for his HLD diagnosis and ASCVD prevention.  Patient has a supply of short acting nitrates (NTG) to use on a as needed basis for recurrent angina/anginal equivalent symptoms; denied recent use. In the setting of known  cardiovascular diagnoses, it is important note that patient is  on a PDE5i medication (tadalafil ) for and erectile dysfunction diagnosis. Patient is not diabetic. He does not have an OSAH diagnosis. Patient is able to complete all of his  ADL/IADLs without cardiovascular limitation.  Per the DASI, patient is able to achieve at least 4 METS of physical activity without experiencing any significant degree of angina/anginal equivalent symptoms.  Given his reported hypotension, antihypertensive medications were decreased.  No other changes were made to his medication regimen during this visit with cardiology. Patient scheduled to follow-up with outpatient cardiology in 1 year or sooner if needed.   Sergio Callahan is scheduled for an elective CT RENAL TUMOR ABLATION UNILATERAL CT RENAL BIOPSY on 10/09/2024 with Dr. Wilkie Drivers, MD. Given patient's past medical history significant for cardiovascular diagnoses, presurgical cardiac clearance was sought by the PAT team. Per cardiology, this patient is optimized for surgery and may proceed with the planned procedural course with a LOW risk of significant perioperative cardiovascular complications.  In review of the patient's medication reconciliation, it is noted that he is on daily oral antithrombotic therapy. He has been instructed on recommendations for holding his daily low dose ASA for 7 days prior to his procedure with plans to restart as soon as postoperative bleeding risk felt to be minimized by his primary attending surgeon. The patient has been instructed that his last dose should be on 10/01/2024.  Patient denies previous perioperative complications with anesthesia in the past. In review his EMR, it is noted that patient underwent a MAC anesthetic course at [facility] ([facility]) in 08/2024 without documented complications.   MOST RECENT VITAL SIGNS:    09/17/2024    9:10 AM 09/09/2024    9:50 AM 09/02/2024   11:34 AM  Vitals with BMI   Height  6' 0   Weight 191 lbs 191 lbs   BMI 25.9 25.9   Systolic 145 128 882  Diastolic 81 81 95  Pulse 78 87 78   PROVIDERS/SPECIALISTS: NOTE: Primary physician provider listed below. Patient may have been seen by APP or partner within same practice.   PROVIDER ROLE / SPECIALTY LAST SHERLEAN Karalee Wilkie MARLA, MD Interventional Radiologist (Proceduralist) 09/17/2024  Rudolpho Norleen BIRCH, MD Primary Care Provider 09/21/2024  Florencio Shine, MD Cardiology 11/04/2023   Tobie Solo, MD Rheumatology 09/08/2024  Avanell Katz, MD Physiatry 07/22/2024  Isadora Shaw, MD Pulmonary Medicine 01/22/2024  Jama Hacker, MD Vascular Surgery 08/03/2024  Babara Call, MD Medical Oncology/Hematology 01/21/2024   ALLERGIES: Allergies  Allergen Reactions   Fosamax [Alendronate Sodium]     Leg pain   Percocet [Oxycodone -Acetaminophen ] Nausea And Vomiting    CURRENT HOME MEDICATIONS:  ALPRAZolam  (XANAX ) 0.5 MG tablet   amLODipine (NORVASC) 2.5 MG tablet   aspirin  EC 81 MG tablet   azaTHIOprine (IMURAN) 50 MG tablet   cyanocobalamin  (,VITAMIN B-12,) 1000 MCG/ML injection   finasteride  (PROSCAR ) 5 MG tablet   hydrochlorothiazide  (HYDRODIURIL ) 12.5 MG tablet   hydroxychloroquine (PLAQUENIL) 200 MG tablet   ibuprofen (ADVIL,MOTRIN) 200 MG tablet   metoprolol  succinate (TOPROL -XL) 50 MG 24 hr tablet   nicotine (NICODERM CQ - DOSED IN MG/24 HOURS) 21 mg/24hr patch   nitroGLYCERIN (NITROLINGUAL) 0.4 MG/SPRAY spray   pantoprazole  (PROTONIX ) 20 MG tablet   pravastatin  (PRAVACHOL ) 40 MG tablet   spironolactone (ALDACTONE) 25 MG tablet   tadalafil  (CIALIS ) 20 MG tablet   tamsulosin  (FLOMAX ) 0.4 MG CAPS capsule   No current facility-administered medications for this encounter.   HISTORY: Past Medical History:  Diagnosis  Date   AAA (abdominal aortic aneurysm)    Abdominal aortic aneurysm without rupture    Adenoma of right adrenal gland    Adenomatous colon polyp    Allergic rhinitis     Anemia    Anogenital (venereal) warts    Anxiety    a.) on BZO PRN (alprazolam )   Aortic atherosclerosis    Aortic atherosclerosis    Arthritis    Atherosclerosis of native artery of right lower extremity with intermittent claudication    B12 deficiency    Back pain    Bilateral adrenal adenomas    BPH (benign prostatic hyperplasia)    Carpal tunnel syndrome    Celiac artery atherosclerosis    Coronary artery disease    a.) MV 02/26/2005: no ischemia; b.) MV 11/24/2010: mild inferosep ischemia; c.) LHC/PCI 12/08/2010: 99% pLAD --> rotational athrectomy + 3.0 x 23 mm Xience DES x 1; d.) MV 02/15/2011: no ischemia; e.) MV 02/14/2018: no ischemia   Daily consumption of alcohol    Degenerative joint disease    DOE (dyspnea on exertion)    Duodenitis    Dyspnea on exertion    Erectile dysfunction    a.) on PDE5i (tadalafil )   GERD (gastroesophageal reflux disease)    Hepatic steatosis    History of kidney stones    History of shingles    History of tobacco abuse    HPV in male    Hx of adenomatous colonic polyps    Hyperlipidemia    Hypertension    Hyponatremia    Iliac artery aneurysm    Long term (current) use of aspirin     Long term current use of immunosuppressive drug    a.) Tx'd with azathioprine + hydroxychloroquine for SLE   Lumbar stenosis    MGUS (monoclonal gammopathy of unknown significance)    Multiple pulmonary nodules determined by computed tomography of lung    Neuroendocrine carcinoma of stomach (HCC) 2023   Nocturia    Nodule of lower lobe of right lung 06/2023   Paresthesia of left lower extremity    Peripheral vascular disease    Renal mass    Right lower lobe pulmonary nodule 06/20/2021   a.) Dotatate PET 06/20/2021: posterior RLL 0.6 x 0.5 mm: b.) LDCT chest 07/27/2022: 0.8 x 0.6 mm; c.) Dotatate PET 02/04/2023: 0.7 x 0.7 mm; d.) CT chest 05/13/2023: 8 x 11 mm; e.) super D chest CT 07/17/2023: spiclated morphology 9 x 8 mm; f.) CT chest 12/09/2023:  cavitary spiculated nodule 10 x 7 mm   Rotator cuff tendonitis    Shingles    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)    a.) Tx'd with azathioprine + hydroxychloroquine   Status post bilateral cataract extraction 08/2024   Urinary frequency    Past Surgical History:  Procedure Laterality Date   BACK SURGERY     BIOPSY  08/28/2021   Procedure: BIOPSY;  Surgeon: Wilhelmenia Aloha Raddle., MD;  Location: [facility];  Service: Gastroenterology;;   BRONCHIAL BIOPSY  07/18/2023   Procedure: BRONCHIAL BIOPSIES;  Surgeon: Isadora Hose, MD;  Location: [facility];  Service: Pulmonary;;   BRONCHIAL BIOPSY Right    BRONCHIAL BIOPSY Right 12/31/2023   BRONCHIAL NEEDLE ASPIRATION BIOPSY  07/18/2023   Procedure: BRONCHIAL NEEDLE ASPIRATION BIOPSIES;  Surgeon: Isadora Hose, MD;  Location: [facility];  Service: Pulmonary;;   BRONCHIAL WASHINGS  07/18/2023   Procedure: BRONCHIAL WASHINGS;  Surgeon: Isadora Hose, MD;  Location: [facility];  Service: Pulmonary;;  BROW LIFT Bilateral 01/27/2021   Procedure: BLEPHAROPLASTY UPPER EYELID; W/EXCESS SKIN BROW PTOSIS REPAIR AND BLEPHAROPTOSIS REPAIR; RESECT EX BILATERAL;  Surgeon: Ashley Greig HERO, MD;  Location: [facility];  Service: Ophthalmology;  Laterality: Bilateral;   carpel tunnel syndrome right      CATARACT EXTRACTION W/PHACO Right 08/19/2024   Procedure: PHACOEMULSIFICATION, CATARACT, WITH IOL INSERTION 7.83, 00:36.0;  Surgeon: Mittie Gaskin, MD;  Location: [facility];  Service: Ophthalmology;  Laterality: Right;   CATARACT EXTRACTION W/PHACO Left 09/02/2024   Procedure: PHACOEMULSIFICATION, CATARACT, WITH IOL INSERTION 4.60 00:41.3;  Surgeon: Mittie Gaskin, MD;  Location: [facility];  Service: Ophthalmology;  Laterality: Left;   COLONOSCOPY     COLONOSCOPY WITH PROPOFOL  N/A 09/30/2019   Procedure: COLONOSCOPY WITH PROPOFOL ;  Surgeon: Dessa Reyes ORN, MD;  Location: [facility];   Service: Endoscopy;  Laterality: N/A;   COLONOSCOPY WITH PROPOFOL  N/A 05/05/2021   Procedure: COLONOSCOPY WITH PROPOFOL ;  Surgeon: Maryruth Ole DASEN, MD;  Location: [facility];  Service: Endoscopy;  Laterality: N/A;   CORONARY ANGIOPLASTY WITH STENT PLACEMENT Left 12/08/2010   Procedure: CORONARY ANGIOPLASTY WITH STENT PLACEMENT (pLAD); Procedure: [facility]   ESOPHAGOGASTRODUODENOSCOPY     ESOPHAGOGASTRODUODENOSCOPY (EGD) WITH PROPOFOL  N/A 05/05/2021   Procedure: ESOPHAGOGASTRODUODENOSCOPY (EGD) WITH PROPOFOL ;  Surgeon: Maryruth Ole DASEN, MD;  Location: [facility];  Service: Endoscopy;  Laterality: N/A;   ESOPHAGOGASTRODUODENOSCOPY (EGD) WITH PROPOFOL  N/A 08/28/2021   Procedure: ESOPHAGOGASTRODUODENOSCOPY (EGD) WITH PROPOFOL ;  Surgeon: Wilhelmenia Aloha Raddle., MD;  Location: [facility];  Service: Gastroenterology;  Laterality: N/A;   ESOPHAGOGASTRODUODENOSCOPY (EGD) WITH PROPOFOL  N/A 08/17/2022   Procedure: ESOPHAGOGASTRODUODENOSCOPY (EGD) WITH PROPOFOL ;  Surgeon: Maryruth Ole DASEN, MD;  Location: [facility];  Service: Endoscopy;  Laterality: N/A;   EUS N/A 08/28/2021   Procedure: UPPER ENDOSCOPIC ULTRASOUND (EUS) RADIAL;  Surgeon: Wilhelmenia Aloha Raddle., MD;  Location: [facility];  Service: Gastroenterology;  Laterality: N/A;   EXCISION OF SKIN TAG N/A 03/06/2024   Procedure: EXCISION, SKIN TAG;  Surgeon: Rodolph Romano, MD;  Location: [facility];  Service: General;  Laterality: N/A;   IR RADIOLOGIST EVAL & MGMT  09/17/2024   LOWER EXTREMITY ANGIOGRAPHY Left 01/10/2024   Procedure: Lower Extremity Angiography;  Surgeon: Jama Cordella MATSU, MD;  Location: [facility];  Service: Cardiovascular;  Laterality: Left;   LUMBAR LAMINECTOMY/DECOMPRESSION MICRODISCECTOMY Left 02/03/2015   Procedure: LUMBAR LAMINECTOMY/DECOMPRESSION MICRODISCECTOMY LEFT LUMBAR FOUR-FIVE WITH REMOVAL OF SYNOVIAL CYST;  Surgeon: Reyes Budge, MD;  Location: [facility];  Service:  Neurosurgery;  Laterality: Left;   NASAL SEPTOPLASTY W/ TURBINOPLASTY Bilateral 10/23/2019   Procedure: NASAL SEPTOPLASTY WITH SUBMUCOUS RESECTION OF TURBINATE;  Surgeon: Herminio Miu, MD;  Location: [facility];  Service: ENT;  Laterality: Bilateral;   PELVIC ANGIOGRAPHY N/A 05/02/2018   Procedure: PELVIC ANGIOGRAPHY;  Surgeon: Jama Cordella MATSU, MD;  Location: [facility];  Service: Cardiovascular;  Laterality: N/A;   POLYPECTOMY  08/28/2021   Procedure: POLYPECTOMY;  Surgeon: Wilhelmenia Aloha Raddle., MD;  Location: [facility];  Service: Gastroenterology;;   RECTAL EXAM UNDER ANESTHESIA N/A 03/06/2024   Procedure: ERASMO UNDER ANESTHESIA, RECTUM;  Surgeon: Rodolph Romano, MD;  Location: [facility];  Service: General;  Laterality: N/A;   SHOULDER ARTHROSCOPY WITH SUBACROMIAL DECOMPRESSION AND BICEP TENDON REPAIR Right 12/04/2018   Procedure: SHOULDER ARTHROSCOPY WITH DEBRIDEMENT, DECOMPRESSION, BICEP TENODESIS;  Surgeon: Edie Norleen PARAS, MD;  Location: [facility];  Service: Orthopedics;  Laterality: Right;   SUBMUCOSAL INJECTION  08/28/2021   Procedure: SUBMUCOSAL INJECTION;  Surgeon: Wilhelmenia Aloha  Mickey., MD;  Location: [facility];  Service: Gastroenterology;;   Family History  Problem Relation Age of Onset   Leukemia Mother        dx 61s   Hyperlipidemia Father    Hypertension Father    Diabetes Father    Colon cancer Father        dx 73s   Hearing loss Father    Vascular Disease Father    Lung cancer Brother        d. 9   Social History   Tobacco Use   Smoking status: Former    Current packs/day: 0.00    Average packs/day: 1.5 packs/day for 30.0 years (45.0 ttl pk-yrs)    Types: Cigarettes    Start date: 04/01/1991    Quit date: 03/31/2021    Years since quitting: 3.5   Smokeless tobacco: Never  Substance Use Topics   Alcohol use: Yes    Alcohol/week: 14.0 standard drinks of alcohol    Types: 14 Standard drinks or equivalent per week     Comment: cocktails about 2 daily   LABS:  Hospital Outpatient Visit on 09/29/2024  Component Date Value Ref Range Status   ABO/RH(D) 09/29/2024 O POS   Final   Antibody Screen 09/29/2024 NEG   Final   Sample Expiration 09/29/2024 10/13/2024,2359   Final   Extend sample reason 09/29/2024    Final                   Value:NO TRANSFUSIONS OR PREGNANCY IN THE PAST 3 MONTHS Performed at [facility], [address] [place], [place] [address]    Component Ref Range & Units 09/08/2024  WBC (White Blood Cell Count) 4.1 - 10.2 10^3/uL 4.3  RBC (Red Blood Cell Count) 4.69 - 6.13 10^6/uL 4.53 Low   Hemoglobin 14.1 - 18.1 gm/dL 84.7  Hematocrit 59.9 - 52.0 % 43.5  MCV (Mean Corpuscular Volume) 80.0 - 100.0 fl 96.0  MCH (Mean Corpuscular Hemoglobin) 27.0 - 31.2 pg 33.6 High   MCHC (Mean Corpuscular Hemoglobin Concentration) 32.0 - 36.0 gm/dL 65.0  Platelet Count 849 - 450 10^3/uL 227  RDW-CV (Red Cell Distribution Width) 11.6 - 14.8 % 12.9  MPV (Mean Platelet Volume) 9.4 - 12.4 fl 9.5  Neutrophils 1.50 - 7.80 10^3/uL 2.48  Lymphocytes 1.00 - 3.60 10^3/uL 1.11  Monocytes 0.00 - 1.50 10^3/uL 0.51  Eosinophils 0.00 - 0.55 10^3/uL 0.12  Basophils 0.00 - 0.09 10^3/uL 0.04  Neutrophil % 32.0 - 70.0 % 57.8  Lymphocyte % 10.0 - 50.0 % 25.9  Monocyte % 4.0 - 13.0 % 11.9  Eosinophil % 1.0 - 5.0 % 2.8  Basophil% 0.0 - 2.0 % 0.9  Immature Granulocyte % <=0.7 % 0.7  Immature Granulocyte Count <=0.06 10^3/L 0.03    Component Ref Range & Units 09/08/2024  Glucose 70 - 110 mg/dL 87  Sodium 863 - 854 mmol/L 135 Low   Potassium 3.6 - 5.1 mmol/L 4.3  Chloride 97 - 109 mmol/L 99  Carbon Dioxide (CO2) 22.0 - 32.0 mmol/L 32.3 High   Urea  Nitrogen (BUN) 7 - 25 mg/dL 12  Creatinine 0.7 - 1.3 mg/dL 0.8  Glomerular Filtration Rate (eGFR) >60 mL/min/1.73sq m 95  Calcium 8.7 - 10.3 mg/dL 9.4  AST 8 - 39 U/L 36  ALT 6 - 57 U/L 43  Alk Phos (alkaline  Phosphatase) 34 - 104 U/L 71  Albumin 3.5 - 4.8 g/dL 4.3  Bilirubin, Total 0.3 - 1.2 mg/dL 0.9  Protein, Total  6.1 - 7.9 g/dL 6.6  A/G Ratio 1.0 - 5.0 gm/dL 1.9   ECG: Date: 88/81/7974  Time ECG obtained: 1335 PM Rate: 91 bpm Rhythm: Sinus rhythm with PACs Axis (leads I and aVF): normal Intervals: PR 176 ms. QRS 74 ms. QTc 474 ms. ST segment and T wave changes: Nonspecific T wave abnormality; prolonged QT Evidence of a possible, age undetermined, prior infarct:  No Comparison: Similar to previous tracing obtained on 12/26/2023   IMAGING / PROCEDURES: MR ABDOMEN W WO CONTRAST performed on 08/17/2024 Interval increase in the size of left kidney upper pole enhancing lesion, compatible with renal cell carcinoma. No evidence of metastatic disease within the abdomen. Stable right adrenal adenoma. Marked diffuse hepatic steatosis.  VAS US  ABI WITH/WO TBI performed on 08/03/2024 Resting right ankle-brachial index is within normal range. The right toe-brachial index is normal.  Resting left ankle-brachial index is within normal range. The left toe-brachial index is normal.    MR ABDOMEN W WO CONTRAST performed on 02/18/2024 Solid enhancing 13 mm exophilic left inter polar renal lesion, compatible with renal cell carcinoma. No evidence of metastatic disease in the abdomen.   NM PET IMAGE INITIAL (PI) SKULL BASE TO THIGH performed on 02/04/2024 No specific areas of abnormal radiotracer uptake. Small lung nodules are again noted as on the recent examinations and do not show abnormal uptake. Largest again seen right lower lobe measuring 12 mm. This is slightly cavitary. Recommend continued workup per surveillance. The enhancing upper pole left-sided renal lesion on recent CT scan with contrast is not show specific abnormal uptake but is still concerning for renal cell carcinoma based on that examination and recommend dedicated evaluation or workup as per prior recommendation such as  urologic consultation.  .  CT ABDOMEN PELVIS W CONTRAST performed on 01/28/2024 Stomach is within normal limits. No gastric wall thickening/mass on CT. No evidence of recurrent or metastatic disease. 16 mm enhancing lesion in the posterior left upper kidney, highly suspicious for in cell carcinoma. MRI abdomen with/without contrast is suggested for further evaluation.   CT CHEST WO CONTRAST performed on 12/09/2023 Stable cavitary spiculated nodule in the right lower lobe measuring 10 x 7 mm. Additional pulmonary nodules are also stable dating back to 07/27/2022. Hepatic steatosis. Aortic atherosclerosis   MR LUMBAR SPINE WO CONTRAST performed on 01/26/2023 Multilevel degenerative changes of the lumbar spine, worst at L4-L5. Unchanged moderate right lateral recess stenosis encroaching the descending right L5 nerve root and mild bilateral neural foraminal stenosis.  Resolved marrow edema previously seen at the facet joints. Unchanged mild spinal canal stenosis at L2-L3 and L3-L4.   MYOCARDIAL PERFUSION IMAGING STUDY (LEXISCAN) performed on 02/14/2018 Normal left ventricular systolic function with a normal LVEF of 54% Normal myocardial thickening and wall motion Left ventricular cavity size normal SPECT images demonstrate homogenous tracer distribution throughout the myocardium No evidence of stress-induced myocardial ischemia or arrhythmia Normal low risk study  IMPRESSION AND PLAN: Sergio Callahan Sergio Callahan has been referred for pre-anesthesia review and clearance prior to him undergoing the planned anesthetic and procedural courses. Available labs, pertinent testing, and imaging results were personally reviewed by me in preparation for upcoming operative/procedural course. [facility] medical record has been updated following extensive record review and patient interview with PAT staff.   This patient has been appropriately cleared by cardiology with an overall LOW risk of patient experiencing  significant perioperative cardiovascular complications. here at [facility]. Based on clinical review performed today (09/30/24), barring any  significant acute changes in the patient's overall condition, it is anticipated that he will be able to proceed with the planned surgical intervention. Any acute changes in clinical condition may necessitate his procedure being postponed and/or cancelled. Patient will meet with anesthesia team (MD and/or CRNA) on the day of his procedure for preoperative evaluation/assessment. Questions regarding anesthetic course will be fielded at that time.   Pre-surgical instructions were reviewed with the patient during his PAT appointment, and questions were fielded to satisfaction by PAT clinical staff. He has been instructed on which medications that he will need to hold prior to surgery, as well as the ones that have been deemed safe/appropriate to take on the day of his procedure. As part of the general education provided by PAT, patient made aware both verbally and in writing, that he would need to abstain from the use of any illegal substances during his perioperative course. He was advised that failure to follow the provided instructions could necessitate case cancellation or result in serious perioperative complications up to and including death. Patient encouraged to contact PAT and/or his surgeon's office to discuss any questions or concerns that may arise prior to surgery; verbalized understanding.   Dorise Pereyra, MSN, APRN, FNP-C, CEN [facility]  Perioperative Services Nurse Practitioner Phone: [phone] Fax: [phone] 09/30/24 10:18 AM  NOTE: This note has been prepared using Dragon dictation software. Despite my best ability to proofread, there is always the potential that unintentional transcriptional errors may still occur from this process.

## 2024-10-07 ENCOUNTER — Other Ambulatory Visit: Payer: Self-pay | Admitting: Student

## 2024-10-07 DIAGNOSIS — Z01818 Encounter for other preprocedural examination: Secondary | ICD-10-CM

## 2024-10-07 NOTE — Progress Notes (Signed)
 Patient for CT LT renal MWA/CT Renal Bx on Friday 10/09/24, I called and spoke with the patient on the phone and gave pre-procedure instructions. Pt was made aware to be here at 7a, last dose of ASA 81mg  was on Sat 10/03/24, NPO after MN prior to procedure as well as driver post procedure/recovery/discharge. Pt stated understanding.  Called 10/07/24

## 2024-10-08 NOTE — H&P (Signed)
 Chief Complaint: Patient was seen in consultation today for right renal mass concerning for RCC, with consideration for biopsy and ablation.  Referring Provider(s): Dr. Redell Burnet, MD   Supervising Physician: Karalee Beat  Patient Status: [facility] - Out-pt  Patient is Full Code  History of Present Illness: Sergio Callahan is a 73 y.o. male  with PMHx notable for right renal mass, HTN, HLD, right lung nodule, MGUS, neuroendocrine carcinoma of stomach, PVD, CAD, systemic lupus erythematosus, iliac artery aneurysm, abdominal aortic aneurysm, BPH, hepatic steatosis, GERD, tobacco use, daily EtOH use, and others as delineated below.  Per Dr. Jacquelin consult note dated 09/17/2024: Very pleasant 73 year old gentleman with an enlarging enhancing solid lesion exophytic from the posterior upper pole of the left kidney.  Enlargement is quite significant, the mass has increased by approximately 194% in terms of volume over a 90-month period.  This is highly concerning for a renal cell carcinoma with a greater than 80% probability.   Sergio Callahan is not an optimal candidate for surgical resection but he is an ideal candidate for percutaneous thermal ablation.  The lesion is exophytic, well-visualized and far from any critical structures.  We discussed the risks, benefits and alternatives to percutaneous thermal ablation at length and time was given for questions.  Sergio Callahan is in favor of proceeding with treatment as soon as possible.   1.)  Please schedule for CT-guided biopsy and concurrent percutaneous microwave ablation at [facility] as soon as possible.  We will use general anesthesia.  Patient is scheduled for biopsy and percutaneous microwave ablation of right renal mass in IR today.   Patient is alert and laying in bed, calm. His wife is at the bedside. Patient is currently without any significant complaints. He feels well. Patient denies any fevers, headache, chest  pain, SOB, cough, abdominal pain, nausea, vomiting or bleeding.     Past Medical History:  Diagnosis Date   AAA (abdominal aortic aneurysm)    Abdominal aortic aneurysm without rupture    Adenoma of right adrenal gland    Adenomatous colon polyp    Allergic rhinitis    Anemia    Anogenital (venereal) warts    Anxiety    a.) on BZO PRN (alprazolam )   Aortic atherosclerosis    Aortic atherosclerosis    Arthritis    Atherosclerosis of native artery of right lower extremity with intermittent claudication    B12 deficiency    Back pain    Bilateral adrenal adenomas    BPH (benign prostatic hyperplasia)    Carpal tunnel syndrome    Celiac artery atherosclerosis    Coronary artery disease    a.) MV 02/26/2005: no ischemia; b.) MV 11/24/2010: mild inferosep ischemia; c.) LHC/PCI 12/08/2010: 99% pLAD --> rotational athrectomy + 3.0 x 23 mm Xience DES x 1; d.) MV 02/15/2011: no ischemia; e.) MV 02/14/2018: no ischemia   Daily consumption of alcohol    Degenerative joint disease    DOE (dyspnea on exertion)    Duodenitis    Dyspnea on exertion    Erectile dysfunction    a.) on PDE5i (tadalafil )   GERD (gastroesophageal reflux disease)    Hepatic steatosis    History of kidney stones    History of shingles    History of tobacco abuse    HPV in male    Hx of adenomatous colonic polyps    Hyperlipidemia    Hypertension    Hyponatremia    Iliac artery  aneurysm    Long term (current) use of aspirin     Long term current use of immunosuppressive drug    a.) Tx'd with azathioprine + hydroxychloroquine for SLE   Lumbar stenosis    MGUS (monoclonal gammopathy of unknown significance)    Multiple pulmonary nodules determined by computed tomography of lung    Neuroendocrine carcinoma of stomach (HCC) 2023   Nocturia    Nodule of lower lobe of right lung 06/2023   Paresthesia of left lower extremity    Peripheral vascular disease    Renal mass    Right lower lobe pulmonary nodule  06/20/2021   a.) Dotatate PET 06/20/2021: posterior RLL 0.6 x 0.5 mm: b.) LDCT chest 07/27/2022: 0.8 x 0.6 mm; c.) Dotatate PET 02/04/2023: 0.7 x 0.7 mm; d.) CT chest 05/13/2023: 8 x 11 mm; e.) super D chest CT 07/17/2023: spiclated morphology 9 x 8 mm; f.) CT chest 12/09/2023: cavitary spiculated nodule 10 x 7 mm   Rotator cuff tendonitis    Shingles    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)    a.) Tx'd with azathioprine + hydroxychloroquine   Status post bilateral cataract extraction 08/2024   Urinary frequency     Past Surgical History:  Procedure Laterality Date   BACK SURGERY     BIOPSY  08/28/2021   Procedure: BIOPSY;  Surgeon: Wilhelmenia Aloha Raddle., MD;  Location: [facility];  Service: Gastroenterology;;   BRONCHIAL BIOPSY  07/18/2023   Procedure: BRONCHIAL BIOPSIES;  Surgeon: Isadora Hose, MD;  Location: [facility];  Service: Pulmonary;;   BRONCHIAL BIOPSY Right    BRONCHIAL BIOPSY Right 12/31/2023   BRONCHIAL NEEDLE ASPIRATION BIOPSY  07/18/2023   Procedure: BRONCHIAL NEEDLE ASPIRATION BIOPSIES;  Surgeon: Isadora Hose, MD;  Location: [facility];  Service: Pulmonary;;   BRONCHIAL WASHINGS  07/18/2023   Procedure: BRONCHIAL WASHINGS;  Surgeon: Isadora Hose, MD;  Location: [facility];  Service: Pulmonary;;   BROW LIFT Bilateral 01/27/2021   Procedure: BLEPHAROPLASTY UPPER EYELID; W/EXCESS SKIN BROW PTOSIS REPAIR AND BLEPHAROPTOSIS REPAIR; RESECT EX BILATERAL;  Surgeon: Ashley Greig HERO, MD;  Location: [facility];  Service: Ophthalmology;  Laterality: Bilateral;   carpel tunnel syndrome right      CATARACT EXTRACTION W/PHACO Right 08/19/2024   Procedure: PHACOEMULSIFICATION, CATARACT, WITH IOL INSERTION 7.83, 00:36.0;  Surgeon: Mittie Gaskin, MD;  Location: [facility];  Service: Ophthalmology;  Laterality: Right;   CATARACT EXTRACTION W/PHACO Left 09/02/2024   Procedure: PHACOEMULSIFICATION, CATARACT, WITH IOL INSERTION 4.60  00:41.3;  Surgeon: Mittie Gaskin, MD;  Location: [facility];  Service: Ophthalmology;  Laterality: Left;   COLONOSCOPY     COLONOSCOPY WITH PROPOFOL  N/A 09/30/2019   Procedure: COLONOSCOPY WITH PROPOFOL ;  Surgeon: Dessa Reyes ORN, MD;  Location: [facility];  Service: Endoscopy;  Laterality: N/A;   COLONOSCOPY WITH PROPOFOL  N/A 05/05/2021   Procedure: COLONOSCOPY WITH PROPOFOL ;  Surgeon: Maryruth Ole DASEN, MD;  Location: [facility];  Service: Endoscopy;  Laterality: N/A;   CORONARY ANGIOPLASTY WITH STENT PLACEMENT Left 12/08/2010   Procedure: CORONARY ANGIOPLASTY WITH STENT PLACEMENT (pLAD); Procedure: [facility]   ESOPHAGOGASTRODUODENOSCOPY     ESOPHAGOGASTRODUODENOSCOPY (EGD) WITH PROPOFOL  N/A 05/05/2021   Procedure: ESOPHAGOGASTRODUODENOSCOPY (EGD) WITH PROPOFOL ;  Surgeon: Maryruth Ole DASEN, MD;  Location: [facility];  Service: Endoscopy;  Laterality: N/A;   ESOPHAGOGASTRODUODENOSCOPY (EGD) WITH PROPOFOL  N/A 08/28/2021   Procedure: ESOPHAGOGASTRODUODENOSCOPY (EGD) WITH PROPOFOL ;  Surgeon: Wilhelmenia Aloha Raddle., MD;  Location: [facility];  Service: Gastroenterology;  Laterality: N/A;   ESOPHAGOGASTRODUODENOSCOPY (EGD)  WITH PROPOFOL  N/A 08/17/2022   Procedure: ESOPHAGOGASTRODUODENOSCOPY (EGD) WITH PROPOFOL ;  Surgeon: Maryruth Ole DASEN, MD;  Location: [facility];  Service: Endoscopy;  Laterality: N/A;   EUS N/A 08/28/2021   Procedure: UPPER ENDOSCOPIC ULTRASOUND (EUS) RADIAL;  Surgeon: Wilhelmenia Aloha Raddle., MD;  Location: [facility];  Service: Gastroenterology;  Laterality: N/A;   EXCISION OF SKIN TAG N/A 03/06/2024   Procedure: EXCISION, SKIN TAG;  Surgeon: Rodolph Romano, MD;  Location: [facility];  Service: General;  Laterality: N/A;   IR RADIOLOGIST EVAL & MGMT  09/17/2024   LOWER EXTREMITY ANGIOGRAPHY Left 01/10/2024   Procedure: Lower Extremity Angiography;  Surgeon: Jama Cordella MATSU, MD;  Location: [facility];  Service:  Cardiovascular;  Laterality: Left;   LUMBAR LAMINECTOMY/DECOMPRESSION MICRODISCECTOMY Left 02/03/2015   Procedure: LUMBAR LAMINECTOMY/DECOMPRESSION MICRODISCECTOMY LEFT LUMBAR FOUR-FIVE WITH REMOVAL OF SYNOVIAL CYST;  Surgeon: Reyes Budge, MD;  Location: [facility];  Service: Neurosurgery;  Laterality: Left;   NASAL SEPTOPLASTY W/ TURBINOPLASTY Bilateral 10/23/2019   Procedure: NASAL SEPTOPLASTY WITH SUBMUCOUS RESECTION OF TURBINATE;  Surgeon: Herminio Miu, MD;  [facility];  Service: ENT;  Laterality: Bilateral;   PELVIC ANGIOGRAPHY N/A 05/02/2018   Procedure: PELVIC ANGIOGRAPHY;  Surgeon: Jama Cordella MATSU, MD;  Location: [facility];  Service: Cardiovascular;  Laterality: N/A;   POLYPECTOMY  08/28/2021   Procedure: POLYPECTOMY;  Surgeon: Wilhelmenia Aloha Raddle., MD;  Location: [facility];  Service: Gastroenterology;;   RECTAL EXAM UNDER ANESTHESIA N/A 03/06/2024   Procedure: ERASMO UNDER ANESTHESIA, RECTUM;  Surgeon: Rodolph Romano, MD;  Location: [facility];  Service: General;  Laterality: N/A;   SHOULDER ARTHROSCOPY WITH SUBACROMIAL DECOMPRESSION AND BICEP TENDON REPAIR Right 12/04/2018   Procedure: SHOULDER ARTHROSCOPY WITH DEBRIDEMENT, DECOMPRESSION, BICEP TENODESIS;  Surgeon: Edie Norleen PARAS, MD;  Location: [facility];  Service: Orthopedics;  Laterality: Right;   SUBMUCOSAL INJECTION  08/28/2021   Procedure: SUBMUCOSAL INJECTION;  Surgeon: Wilhelmenia Aloha Raddle., MD;  Location: [facility];  Service: Gastroenterology;;    Allergies: Fosamax [alendronate sodium] and Percocet [oxycodone -acetaminophen ]  Medications: Prior to Admission medications   Medication Sig Start Date End Date Taking? Authorizing Provider  ALPRAZolam  (XANAX ) 0.5 MG tablet Take 0.5 mg by mouth 3 (three) times daily as needed for anxiety.     [provider]  amLODipine (NORVASC) 2.5 MG tablet Take 5 mg by mouth daily.    [provider]  aspirin  EC 81 MG  tablet Take 81 mg by mouth daily.    [provider]  azaTHIOprine (IMURAN) 50 MG tablet Take 125 mg by mouth daily.     [provider]  cyanocobalamin  (,VITAMIN B-12,) 1000 MCG/ML injection Inject 1,000 mcg into the muscle every 14 (fourteen) days.     [provider]  finasteride  (PROSCAR ) 5 MG tablet Take 1 tablet (5 mg total) by mouth daily. 09/09/24   Francisca Redell BROCKS, MD  hydrochlorothiazide  (HYDRODIURIL ) 12.5 MG tablet Take 12.5 mg by mouth daily.    [provider]  hydroxychloroquine (PLAQUENIL) 200 MG tablet Take 200 mg by mouth 2 (two) times daily.    [provider]  ibuprofen (ADVIL,MOTRIN) 200 MG tablet Take 400 mg by mouth every 8 (eight) hours as needed for headache or moderate pain.    [provider]  metoprolol  succinate (TOPROL -XL) 50 MG 24 hr tablet Take 50 mg by mouth at bedtime. Take with or immediately following a meal.    [provider]  nicotine (NICODERM CQ - DOSED IN MG/24  HOURS) 21 mg/24hr patch Place 21 mg onto the skin daily.    [provider]  nitroGLYCERIN (NITROLINGUAL) 0.4 MG/SPRAY spray Place 1 spray under the tongue every 5 (five) minutes x 3 doses as needed for chest pain.    [provider]  pantoprazole  (PROTONIX ) 20 MG tablet Take 1 tablet (20 mg total) by mouth daily. 01/11/24 01/10/25  Schnier, Cordella MATSU, MD  pravastatin  (PRAVACHOL ) 40 MG tablet Take 40 mg by mouth at bedtime.    [provider]  spironolactone (ALDACTONE) 25 MG tablet Take 25 mg by mouth daily.    [provider]  tadalafil  (CIALIS ) 20 MG tablet Take 1 tablet (20 mg total) by mouth daily as needed for erectile dysfunction. 09/09/24   Francisca Redell BROCKS, MD  tamsulosin  (FLOMAX ) 0.4 MG CAPS capsule Take 1 capsule (0.4 mg total) by mouth daily after breakfast. 09/09/24   Francisca Redell BROCKS, MD     Family History  Problem Relation Age of Onset   Leukemia Mother        dx 37s   Hyperlipidemia  Father    Hypertension Father    Diabetes Father    Colon cancer Father        dx 77s   Hearing loss Father    Vascular Disease Father    Lung cancer Brother        d. 69    Social History   Socioeconomic History   Marital status: Married    Spouse name: WYNDELL, CARDIFF (Spouse) [phone] (Home Phone)   Number of children: Not on file   Years of education: Not on file   Highest education level: Not on file  Occupational History   Not on file  Tobacco Use   Smoking status: Former    Current packs/day: 0.00    Average packs/day: 1.5 packs/day for 30.0 years (45.0 ttl pk-yrs)    Types: Cigarettes    Start date: 04/01/1991    Quit date: 03/31/2021    Years since quitting: 3.5   Smokeless tobacco: Never  Vaping Use   Vaping status: Never Used  Substance and Sexual Activity   Alcohol use: Yes    Alcohol/week: 14.0 standard drinks of alcohol    Types: 14 Standard drinks or equivalent per week    Comment: cocktails about 2 daily   Drug use: No   Sexual activity: Yes  Other Topics Concern   Not on file  Social History Narrative   Not on file   Social Drivers of Health   Financial Resource Strain: Low Risk  (02/17/2024)   Received from [facility]   Overall Financial Resource Strain (CARDIA)    Difficulty of Paying Living Expenses: Not hard at all  Food Insecurity: No Food Insecurity (02/17/2024)   Received from [facility]   Hunger Vital Sign    Within the past 12 months, you worried that your food would run out before you got the money to buy more.: Never true    Within the past 12 months, the food you bought just didn't last and you didn't have money to get more.: Never true  Transportation Needs: No Transportation Needs (02/17/2024)   Received from [facility]    In the past 12 months, has lack of transportation kept you from medical appointments or from getting medications?: No    Lack of  Transportation (Non-Medical): No  Physical Activity: Not on file  Stress: Not  on file  Social Connections: Not on file     Review of Systems: A 12 point ROS discussed and pertinent positives are indicated in the HPI above.  All other systems are negative.  Vital Signs: BP (!) 150/90   Pulse 72   Temp 97.8 F (36.6 C) (Temporal)   Resp 12   Ht 6' (1.829 m)   SpO2 98%   BMI 25.90 kg/m   Advance Care Plan: The advanced care place/surrogate decision maker was discussed at the time of visit and the patient did not wish to discuss or was not able to name a surrogate decision maker or provide an advance care plan.  Physical Exam Vitals reviewed.  Constitutional:      General: He is not in acute distress.    Appearance: Normal appearance.  HENT:     Mouth/Throat:     Mouth: Mucous membranes are dry.  Cardiovascular:     Rate and Rhythm: Normal rate and regular rhythm.     Pulses: Normal pulses.     Heart sounds: Normal heart sounds.  Pulmonary:     Effort: Pulmonary effort is normal.     Breath sounds: Normal breath sounds.  Abdominal:     General: Abdomen is flat.  Musculoskeletal:        General: Normal range of motion.     Cervical back: Normal range of motion.  Skin:    General: Skin is warm and dry.  Neurological:     Mental Status: He is alert and oriented to person, place, and time.  Psychiatric:        Mood and Affect: Mood normal.        Behavior: Behavior normal.        Thought Content: Thought content normal.        Judgment: Judgment normal.     Imaging: IR Radiologist Eval & Mgmt Result Date: 09/17/2024 EXAM: NEW PATIENT OFFICE VISIT CHIEF COMPLAINT: SEE NOTE IN EPIC HISTORY OF PRESENT ILLNESS: SEE NOTE IN EPIC REVIEW OF SYSTEMS: SEE NOTE IN EPIC PHYSICAL EXAMINATION: SEE NOTE IN EPIC ASSESSMENT AND PLAN: SEE NOTE IN EPIC Electronically Signed   By: Wilkie Lent M.D.   On: 09/17/2024 09:11    Labs:  CBC: Recent Labs    01/14/24 1023  WBC 5.1   HGB 15.3  HCT 44.3  PLT 235    COAGS: No results for input(s): INR, APTT in the last 8760 hours.  BMP: Recent Labs    01/10/24 0812 01/14/24 1023  NA  --  133*  K  --  3.9  CL  --  99  CO2  --  26  GLUCOSE  --  100*  BUN 14 14  CALCIUM  --  8.9  CREATININE 0.77 0.82  GFRNONAA >60 >60    LIVER FUNCTION TESTS: Recent Labs    01/14/24 1023  BILITOT 1.1  AST 30  ALT 32  ALKPHOS 57  PROT 7.0  ALBUMIN 3.7    TUMOR MARKERS: No results for input(s): AFPTM, CEA, CA199, CHROMGRNA in the last 8760 hours.  Assessment and Plan: Per Dr. Jacquelin consult note dated 09/17/2024: [...] Mr. Wheatley is not an optimal candidate for surgical resection but he is an ideal candidate for percutaneous thermal ablation.  The lesion is exophytic, well-visualized and far from any critical structures.  We discussed the risks, benefits and alternatives to percutaneous thermal ablation at length and time was given for questions.  Mr. Sehgal is in favor of  proceeding with treatment as soon as possible.   1.)  Please schedule for CT-guided biopsy and concurrent percutaneous microwave ablation at [facility] as soon as possible.  We will use general anesthesia.  Patient presents for scheduled biopsy and percutaneous microwave ablation of right renal mass in IR today under general anesthesia.  Patient has been NPO since midnight.  All labs and medications are within acceptable parameters.  Allergies reviewed: Fosomax; Percocet (N&V).  Risks and benefits of renal mass biopsy was discussed with the patient and/or patient's family including, but not limited to bleeding, infection, damage to adjacent structures or low yield requiring additional tests.  Risks and benefits of renal mass microwave ablation discussed with the patient including, but not limited to bleeding, infection, liver failure, bile duct injury, pneumothorax or damage to adjacent structures.  All of the  questions were answered and there is agreement to proceed.  Consent signed and in chart.     Thank you for allowing our service to participate in KAMEL HAVEN 's care.  Electronically Signed: Carlin DELENA Griffon, PA-C   10/09/2024, 7:57 AM      I spent a total of 40 Minutes in face to face in clinical consultation, greater than 50% of which was counseling/coordinating care for right renal mass concerning for RCC, with consideration for biopsy and ablation.

## 2024-10-09 ENCOUNTER — Observation Stay
Admission: RE | Admit: 2024-10-09 | Discharge: 2024-10-09 | Disposition: A | Source: Ambulatory Visit | Attending: Interventional Radiology | Admitting: Interventional Radiology

## 2024-10-09 ENCOUNTER — Observation Stay
Admission: RE | Admit: 2024-10-09 | Discharge: 2024-10-10 | Disposition: A | Discharge status: Home / Self Care | Source: Ambulatory Visit | Attending: Internal Medicine | Admitting: Internal Medicine

## 2024-10-09 ENCOUNTER — Other Ambulatory Visit: Payer: Self-pay

## 2024-10-09 ENCOUNTER — Encounter: Payer: Self-pay | Admitting: Internal Medicine

## 2024-10-09 ENCOUNTER — Encounter: Payer: Self-pay | Admitting: Urgent Care

## 2024-10-09 DIAGNOSIS — N2889 Other specified disorders of kidney and ureter: Principal | ICD-10-CM | POA: Insufficient documentation

## 2024-10-09 DIAGNOSIS — S37012A Minor contusion of left kidney, initial encounter: Principal | ICD-10-CM

## 2024-10-09 DIAGNOSIS — Z01818 Encounter for other preprocedural examination: Secondary | ICD-10-CM

## 2024-10-09 DIAGNOSIS — M1990 Unspecified osteoarthritis, unspecified site: Secondary | ICD-10-CM | POA: Diagnosis present

## 2024-10-09 DIAGNOSIS — F17210 Nicotine dependence, cigarettes, uncomplicated: Secondary | ICD-10-CM | POA: Insufficient documentation

## 2024-10-09 DIAGNOSIS — L932 Other local lupus erythematosus: Secondary | ICD-10-CM | POA: Insufficient documentation

## 2024-10-09 DIAGNOSIS — N401 Enlarged prostate with lower urinary tract symptoms: Secondary | ICD-10-CM | POA: Insufficient documentation

## 2024-10-09 DIAGNOSIS — N179 Acute kidney failure, unspecified: Principal | ICD-10-CM | POA: Insufficient documentation

## 2024-10-09 DIAGNOSIS — N138 Other obstructive and reflux uropathy: Secondary | ICD-10-CM | POA: Insufficient documentation

## 2024-10-09 DIAGNOSIS — Z7982 Long term (current) use of aspirin: Secondary | ICD-10-CM | POA: Insufficient documentation

## 2024-10-09 DIAGNOSIS — I10 Essential (primary) hypertension: Secondary | ICD-10-CM | POA: Insufficient documentation

## 2024-10-09 DIAGNOSIS — L930 Discoid lupus erythematosus: Secondary | ICD-10-CM | POA: Diagnosis present

## 2024-10-09 DIAGNOSIS — I70219 Atherosclerosis of native arteries of extremities with intermittent claudication, unspecified extremity: Secondary | ICD-10-CM | POA: Diagnosis present

## 2024-10-09 DIAGNOSIS — Z79899 Other long term (current) drug therapy: Secondary | ICD-10-CM | POA: Insufficient documentation

## 2024-10-09 DIAGNOSIS — I2510 Atherosclerotic heart disease of native coronary artery without angina pectoris: Secondary | ICD-10-CM | POA: Insufficient documentation

## 2024-10-09 HISTORY — DX: Benign neoplasm of right adrenal gland: D35.01

## 2024-10-09 LAB — CBC
HCT: 45 % (ref 39.0–52.0)
Hemoglobin: 15.4 g/dL (ref 13.0–17.0)
MCH: 33.3 pg (ref 26.0–34.0)
MCHC: 34.2 g/dL (ref 30.0–36.0)
MCV: 97.4 fL (ref 80.0–100.0)
Platelets: 251 K/uL (ref 150–400)
RBC: 4.62 MIL/uL (ref 4.22–5.81)
RDW: 13.2 % (ref 11.5–15.5)
WBC: 4.5 K/uL (ref 4.0–10.5)
nRBC: 0 % (ref 0.0–0.2)

## 2024-10-09 LAB — BASIC METABOLIC PANEL WITH GFR
Anion gap: 10 (ref 5–15)
BUN: 13 mg/dL (ref 8–23)
CO2: 27 mmol/L (ref 22–32)
Calcium: 9.2 mg/dL (ref 8.9–10.3)
Chloride: 101 mmol/L (ref 98–111)
Creatinine, Ser: 0.81 mg/dL (ref 0.61–1.24)
GFR, Estimated: >60 mL/min (ref >60)
Glucose, Bld: 113 mg/dL — ABNORMAL HIGH (ref 70–99)
Potassium: 4 mmol/L (ref 3.5–5.1)
Sodium: 138 mmol/L (ref 135–145)

## 2024-10-09 LAB — HEMOGLOBIN AND HEMATOCRIT, BLOOD
HCT: 41.5 % (ref 39.0–52.0)
HCT: 39 % (ref 39.0–52.0)
Hemoglobin: 14.1 g/dL (ref 13.0–17.0)
Hemoglobin: 13.8 g/dL (ref 13.0–17.0)

## 2024-10-09 LAB — PROTIME-INR
INR: 0.9 (ref 0.8–1.2)
Prothrombin Time: 13.1 s (ref 11.4–15.2)

## 2024-10-09 LAB — ABO/RH: ABO/RH(D): O POS

## 2024-10-09 MED ORDER — OXYCODONE HCL 5 MG PO TABS
10.0000 mg | ORAL_TABLET | ORAL | Status: DC | PRN
Start: 2024-10-09 — End: 2024-10-10
  Administered 2024-10-09 – 2024-10-10 (×3): 10 mg via ORAL
  Filled 2024-10-09 (×3): qty 2

## 2024-10-09 MED ORDER — DOCUSATE SODIUM 100 MG PO CAPS
100.0000 mg | ORAL_CAPSULE | Freq: Two times a day (BID) | ORAL | Status: DC
  Filled 2024-10-09: qty 1

## 2024-10-09 MED ORDER — LORAZEPAM 2 MG/ML IJ SOLN: 1.0000 mg | INTRAMUSCULAR | Status: DC | PRN

## 2024-10-09 MED ORDER — TRAZODONE HCL 50 MG PO TABS
25.0000 mg | ORAL_TABLET | Freq: Every evening | ORAL | Status: DC | PRN
  Filled 2024-10-09: qty 1

## 2024-10-09 MED ORDER — CHLORHEXIDINE GLUCONATE 0.12 % MT SOLN
15.0000 mL | Freq: Once | OROMUCOSAL | Status: AC
  Administered 2024-10-09: 15 mL via OROMUCOSAL
  Filled 2024-10-09 (×2): qty 15

## 2024-10-09 MED ORDER — AMLODIPINE BESYLATE 5 MG PO TABS
5.0000 mg | ORAL_TABLET | Freq: Every day | ORAL | Status: DC
  Administered 2024-10-10: 5 mg via ORAL
  Filled 2024-10-09 (×2): qty 1

## 2024-10-09 MED ORDER — FENTANYL CITRATE (PF) 50 MCG/ML IJ SOSY
25.0000 ug | PREFILLED_SYRINGE | INTRAMUSCULAR | Status: DC | PRN
  Administered 2024-10-09 (×2): 50 ug via INTRAVENOUS

## 2024-10-09 MED ORDER — ORAL CARE MOUTH RINSE: 15.0000 mL | Freq: Once | OROMUCOSAL | Status: AC

## 2024-10-09 MED ORDER — METOPROLOL SUCCINATE ER 50 MG PO TB24
50.0000 mg | ORAL_TABLET | Freq: Every day | ORAL | Status: DC
  Administered 2024-10-09: 50 mg via ORAL
  Filled 2024-10-09: qty 1

## 2024-10-09 MED ORDER — EPHEDRINE SULFATE-NACL 50-0.9 MG/10ML-% IV SOSY
PREFILLED_SYRINGE | INTRAVENOUS | Status: DC | PRN
  Administered 2024-10-09: 5 mg via INTRAVENOUS

## 2024-10-09 MED ORDER — ACETAMINOPHEN 650 MG RE SUPP
650.0000 mg | Freq: Four times a day (QID) | RECTAL | Status: DC | PRN
Start: 2024-10-09 — End: 2024-10-10

## 2024-10-09 MED ORDER — ALBUTEROL SULFATE (2.5 MG/3ML) 0.083% IN NEBU: 2.5000 mg | INHALATION_SOLUTION | RESPIRATORY_TRACT | Status: DC | PRN

## 2024-10-09 MED ORDER — FENTANYL CITRATE (PF) 100 MCG/2ML IJ SOLN
INTRAMUSCULAR | Status: AC
  Filled 2024-10-09: qty 2

## 2024-10-09 MED ORDER — HYDROCODONE-ACETAMINOPHEN 5-325 MG PO TABS: 1.0000 | ORAL_TABLET | ORAL | Status: DC | PRN

## 2024-10-09 MED ORDER — DROPERIDOL 2.5 MG/ML IJ SOLN: 0.6250 mg | Freq: Once | INTRAMUSCULAR | Status: DC | PRN

## 2024-10-09 MED ORDER — ONDANSETRON HCL 4 MG/2ML IJ SOLN: 4.0000 mg | Freq: Four times a day (QID) | INTRAMUSCULAR | Status: DC | PRN

## 2024-10-09 MED ORDER — LIDOCAINE HCL (PF) 2 % IJ SOLN
INTRAMUSCULAR | Status: AC
  Filled 2024-10-09: qty 5

## 2024-10-09 MED ORDER — PROPOFOL 10 MG/ML IV BOLUS
INTRAVENOUS | Status: AC
  Filled 2024-10-09: qty 20

## 2024-10-09 MED ORDER — ACETAMINOPHEN 10 MG/ML IV SOLN: 1000.0000 mg | Freq: Once | INTRAVENOUS | Status: DC | PRN

## 2024-10-09 MED ORDER — ONDANSETRON HCL 4 MG PO TABS: 4.0000 mg | ORAL_TABLET | Freq: Four times a day (QID) | ORAL | Status: DC | PRN

## 2024-10-09 MED ORDER — FINASTERIDE 5 MG PO TABS
5.0000 mg | ORAL_TABLET | Freq: Every day | ORAL | Status: DC
  Administered 2024-10-09: 5 mg via ORAL
  Filled 2024-10-09 (×2): qty 1

## 2024-10-09 MED ORDER — PROPOFOL 10 MG/ML IV BOLUS
INTRAVENOUS | Status: AC
Start: 2024-10-09 — End: 2024-10-09
  Filled 2024-10-09: qty 20

## 2024-10-09 MED ORDER — LORAZEPAM 1 MG PO TABS: 1.0000 mg | ORAL_TABLET | ORAL | Status: DC | PRN

## 2024-10-09 MED ORDER — LIDOCAINE 1 % OPTIME INJ - NO CHARGE
2.0000 mL | Freq: Once | INTRAMUSCULAR | Status: AC
  Administered 2024-10-09: 2 mL via INTRADERMAL
  Filled 2024-10-09: qty 2

## 2024-10-09 MED ORDER — CHLORHEXIDINE GLUCONATE CLOTH 2 % EX PADS
6.0000 | MEDICATED_PAD | Freq: Every day | CUTANEOUS | Status: DC
  Administered 2024-10-09: 6 via TOPICAL

## 2024-10-09 MED ORDER — OXYCODONE HCL 5 MG PO TABS: 5.0000 mg | ORAL_TABLET | Freq: Once | ORAL | Status: DC | PRN

## 2024-10-09 MED ORDER — LIDOCAINE HCL (CARDIAC) PF 100 MG/5ML IV SOSY
PREFILLED_SYRINGE | INTRAVENOUS | Status: DC | PRN
  Administered 2024-10-09: 100 mg via INTRAVENOUS

## 2024-10-09 MED ORDER — DEXAMETHASONE SOD PHOSPHATE PF 10 MG/ML IJ SOLN
INTRAMUSCULAR | Status: DC | PRN
Start: 2024-10-09 — End: 2024-10-09
  Administered 2024-10-09: 5 mg via INTRAVENOUS

## 2024-10-09 MED ORDER — PHENYLEPHRINE HCL-NACL 20-0.9 MG/250ML-% IV SOLN
INTRAVENOUS | Status: AC
  Filled 2024-10-09: qty 250

## 2024-10-09 MED ORDER — CEFAZOLIN SODIUM-DEXTROSE 2-4 GM/100ML-% IV SOLN
2.0000 g | Freq: Once | INTRAVENOUS | Status: AC
  Administered 2024-10-09: 2 g via INTRAVENOUS

## 2024-10-09 MED ORDER — OXYCODONE HCL 5 MG/5ML PO SOLN: 5.0000 mg | Freq: Once | ORAL | Status: DC | PRN

## 2024-10-09 MED ORDER — LACTATED RINGERS IV SOLN: INTRAVENOUS | Status: DC

## 2024-10-09 MED ORDER — HYDRALAZINE HCL 20 MG/ML IJ SOLN: 5.0000 mg | Freq: Four times a day (QID) | INTRAMUSCULAR | Status: DC | PRN

## 2024-10-09 MED ORDER — ROSUVASTATIN CALCIUM 10 MG PO TABS
40.0000 mg | ORAL_TABLET | Freq: Every day | ORAL | Status: DC
  Administered 2024-10-09: 40 mg via ORAL
  Filled 2024-10-09 (×2): qty 4

## 2024-10-09 MED ORDER — ACETAMINOPHEN 325 MG PO TABS
650.0000 mg | ORAL_TABLET | Freq: Four times a day (QID) | ORAL | Status: DC | PRN
  Administered 2024-10-09: 650 mg via ORAL
  Filled 2024-10-09 (×2): qty 2

## 2024-10-09 MED ORDER — PHENYLEPHRINE 80 MCG/ML (10ML) SYRINGE FOR IV PUSH (FOR BLOOD PRESSURE SUPPORT)
PREFILLED_SYRINGE | INTRAVENOUS | Status: DC | PRN
  Administered 2024-10-09: 80 ug via INTRAVENOUS

## 2024-10-09 MED ORDER — IOHEXOL 300 MG/ML  SOLN
100.0000 mL | Freq: Once | INTRAMUSCULAR | Status: AC | PRN
  Administered 2024-10-09: 100 mL via INTRAVENOUS

## 2024-10-09 MED ORDER — PROPOFOL 1000 MG/100ML IV EMUL
INTRAVENOUS | Status: AC
  Filled 2024-10-09: qty 100

## 2024-10-09 MED ORDER — ROCURONIUM BROMIDE 10 MG/ML (PF) SYRINGE
PREFILLED_SYRINGE | INTRAVENOUS | Status: AC
  Filled 2024-10-09: qty 10

## 2024-10-09 MED ORDER — CEFAZOLIN SODIUM-DEXTROSE 2-4 GM/100ML-% IV SOLN
INTRAVENOUS | Status: AC
Start: 2024-10-09 — End: 2024-10-09
  Filled 2024-10-09: qty 100

## 2024-10-09 MED ORDER — TAMSULOSIN HCL 0.4 MG PO CAPS
0.4000 mg | ORAL_CAPSULE | Freq: Every day | ORAL | Status: DC
  Administered 2024-10-10: 0.4 mg via ORAL
  Filled 2024-10-09: qty 1

## 2024-10-09 MED ORDER — ALPRAZOLAM 0.5 MG PO TABS
0.5000 mg | ORAL_TABLET | Freq: Three times a day (TID) | ORAL | Status: DC | PRN
Start: 2024-10-09 — End: 2024-10-09

## 2024-10-09 MED ORDER — HYDROXYCHLOROQUINE SULFATE 200 MG PO TABS
200.0000 mg | ORAL_TABLET | Freq: Two times a day (BID) | ORAL | Status: DC
  Administered 2024-10-09 – 2024-10-10 (×3): 200 mg via ORAL
  Filled 2024-10-09 (×3): qty 1

## 2024-10-09 MED ORDER — PHENYLEPHRINE HCL-NACL 20-0.9 MG/250ML-% IV SOLN
INTRAVENOUS | Status: DC | PRN
  Administered 2024-10-09: 20 ug/min via INTRAVENOUS

## 2024-10-09 MED ORDER — SODIUM CHLORIDE 0.9 % IV SOLN: INTRAVENOUS | Status: DC

## 2024-10-09 MED ORDER — PANTOPRAZOLE SODIUM 40 MG PO TBEC
40.0000 mg | DELAYED_RELEASE_TABLET | Freq: Every day | ORAL | Status: DC
  Administered 2024-10-09: 40 mg via ORAL
  Filled 2024-10-09 (×2): qty 1

## 2024-10-09 MED ORDER — SUGAMMADEX SODIUM 500 MG/5ML IV SOLN
INTRAVENOUS | Status: DC | PRN
  Administered 2024-10-09: 100 mg via INTRAVENOUS

## 2024-10-09 MED ORDER — PROPOFOL 10 MG/ML IV BOLUS
INTRAVENOUS | Status: DC | PRN
  Administered 2024-10-09: 125 ug/kg/min via INTRAVENOUS
  Administered 2024-10-09: 200 mg via INTRAVENOUS

## 2024-10-09 MED ORDER — ROCURONIUM BROMIDE 100 MG/10ML IV SOLN
INTRAVENOUS | Status: DC | PRN
  Administered 2024-10-09: 40 mg via INTRAVENOUS
  Administered 2024-10-09: 10 mg via INTRAVENOUS

## 2024-10-09 MED ORDER — FENTANYL CITRATE (PF) 100 MCG/2ML IJ SOLN
INTRAMUSCULAR | Status: DC | PRN
  Administered 2024-10-09: 50 ug via INTRAVENOUS

## 2024-10-09 NOTE — Progress Notes (Signed)
  IR BRIEF PROGRESS NOTE:  Patient underwent Left renal mass biopsy and microwave ablation. A small subcapsular hematoma was noted at end of procedure, appears stable. Per Dr. Karalee, IR will admit patient overnight for observation. Plan to discharge on 11/29 in AM if patient remains stable.    Electronically Signed: Carlin DELENA Griffon, PA-C 10/09/2024, 10:52 AM

## 2024-10-09 NOTE — Progress Notes (Signed)
 Patient's clothing/belonging bag labeled with name, MRN and delivered to diplomatic services operational officer at [facility] nurses station. Patient was not yet in assigned room.

## 2024-10-09 NOTE — Transfer of Care (Signed)
 Immediate Anesthesia Transfer of Care Note  Patient: Sergio Callahan  Procedure(s) Performed: CT RENAL TUMOR ABLATION UNILATERAL CT RENAL BIOPSY  Patient Location: PACU  Anesthesia Type:General  Level of Consciousness: awake, alert , and oriented  Airway & Oxygen Therapy: Patient Spontanous Breathing and Patient connected to nasal cannula oxygen  Post-op Assessment: Report given to RN and Post -op Vital signs reviewed and stable  Post vital signs: stable  Last Vitals:  Vitals Value Taken Time  BP 130/80 10/09/24 10:38  Temp    Pulse 69 10/09/24 10:40  Resp    SpO2 96 % 10/09/24 10:40  Vitals shown include unfiled device data.  Last Pain:  Vitals:   10/09/24 0728  TempSrc: Temporal  PainSc: 0-No pain         Complications: No notable events documented.

## 2024-10-09 NOTE — H&P (Addendum)
 History and Physical  Sergio Callahan FMW:[id number] DOB: April 20, 1951 DOA: 10/09/2024  PCP: Rudolpho Norleen BIRCH, MD   Chief Complaint: Subcapsular hematoma after left renal mass biopsy and microwave ablation  HPI: Sergio Callahan is a 73 y.o. male with medical history significant for BPH, hypertension, hyperlipidemia and growing left renal mass being admitted for observation after left renal mass biopsy and microwave ablation.  Patient is resting comfortably when seen in his room on the MedSurg floor, he has no complaints other than some very minimal left-sided soreness.  Denies any fevers, chills, nausea, vomiting or any other complaints.  Review of Systems: Please see HPI for pertinent positives and negatives. A complete 10 system review of systems are otherwise negative.  Past Medical History:  Diagnosis Date   AAA (abdominal aortic aneurysm)    Abdominal aortic aneurysm without rupture    Adenoma of right adrenal gland    Adenomatous colon polyp    Allergic rhinitis    Anemia    Anogenital (venereal) warts    Anxiety    a.) on BZO PRN (alprazolam )   Aortic atherosclerosis    Aortic atherosclerosis    Arthritis    Atherosclerosis of native artery of right lower extremity with intermittent claudication    B12 deficiency    Back pain    Bilateral adrenal adenomas    BPH (benign prostatic hyperplasia)    Carpal tunnel syndrome    Celiac artery atherosclerosis    Coronary artery disease    a.) MV 02/26/2005: no ischemia; b.) MV 11/24/2010: mild inferosep ischemia; c.) LHC/PCI 12/08/2010: 99% pLAD --> rotational athrectomy + 3.0 x 23 mm Xience DES x 1; d.) MV 02/15/2011: no ischemia; e.) MV 02/14/2018: no ischemia   Daily consumption of alcohol    Degenerative joint disease    DOE (dyspnea on exertion)    Duodenitis    Dyspnea on exertion    Erectile dysfunction    a.) on PDE5i (tadalafil )   GERD (gastroesophageal reflux disease)    Hepatic steatosis    History of kidney stones     History of shingles    History of tobacco abuse    HPV in male    Hx of adenomatous colonic polyps    Hyperlipidemia    Hypertension    Hyponatremia    Iliac artery aneurysm    Long term (current) use of aspirin     Long term current use of immunosuppressive drug    a.) Tx'd with azathioprine + hydroxychloroquine for SLE   Lumbar stenosis    MGUS (monoclonal gammopathy of unknown significance)    Multiple pulmonary nodules determined by computed tomography of lung    Neuroendocrine carcinoma of stomach (HCC) 2023   Nocturia    Nodule of lower lobe of right lung 06/2023   Paresthesia of left lower extremity    Peripheral vascular disease    Renal mass    Right lower lobe pulmonary nodule 06/20/2021   a.) Dotatate PET 06/20/2021: posterior RLL 0.6 x 0.5 mm: b.) LDCT chest 07/27/2022: 0.8 x 0.6 mm; c.) Dotatate PET 02/04/2023: 0.7 x 0.7 mm; d.) CT chest 05/13/2023: 8 x 11 mm; e.) super D chest CT 07/17/2023: spiclated morphology 9 x 8 mm; f.) CT chest 12/09/2023: cavitary spiculated nodule 10 x 7 mm   Rotator cuff tendonitis    Shingles    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)    a.) Tx'd with azathioprine + hydroxychloroquine   Status post bilateral cataract extraction  08/2024   Urinary frequency    Past Surgical History:  Procedure Laterality Date   BACK SURGERY     BIOPSY  08/28/2021   Procedure: BIOPSY;  Surgeon: Wilhelmenia Aloha Raddle., MD;  Location: [facility];  Service: Gastroenterology;;   BRONCHIAL BIOPSY  07/18/2023   Procedure: BRONCHIAL BIOPSIES;  Surgeon: Isadora Hose, MD;  Location: [facility] ENDOSCOPY;  Service: Pulmonary;;   BRONCHIAL BIOPSY Right    BRONCHIAL BIOPSY Right 12/31/2023   BRONCHIAL NEEDLE ASPIRATION BIOPSY  07/18/2023   Procedure: BRONCHIAL NEEDLE ASPIRATION BIOPSIES;  Surgeon: Isadora Hose, MD;  Location: [facility];  Service: Pulmonary;;   BRONCHIAL WASHINGS  07/18/2023   Procedure: BRONCHIAL WASHINGS;  Surgeon: Isadora Hose, MD;  Location: [facility] ENDOS[facility];  Service: Pulmonary;;   BROW LIFT Bilateral 01/27/2021   Procedure: BLEPHAROPLASTY UPPER EYELID; W/EXCESS SKIN BROW PTOSIS REPAIR AND BLEPHAROPTOSIS REPAIR; RESECT EX BILATERAL;  Surgeon: Ashley Greig HERO, MD;  Location: [facility];  Service: Ophthalmology;  Laterality: Bilateral;   carpel tunnel syndrome right      CATARACT EXTRACTION W/PHACO Right 08/19/2024   Procedure: PHACOEMULSIFICATION, CATARACT, WITH IOL INSERTION 7.83, 00:36.0;  Surgeon: Mittie Gaskin, MD;  Location: [facility];  Service: Ophthalmology;  Laterality: Right;   CATARACT EXTRACTION W/PHACO Left 09/02/2024   Procedure: PHACOEMULSIFICATION, CATARACT, WITH IOL INSERTION 4.60 00:41.3;  Surgeon: Mittie Gaskin, MD;  Location: [facility];  Service: Ophthalmology;  Laterality: Left;   COLONOSCOPY     COLONOSCOPY WITH PROPOFOL  N/A 09/30/2019   Procedure: COLONOSCOPY WITH PROPOFOL ;  Surgeon: Dessa Reyes ORN, MD;  Location: [facility] ENDOSCOPY;  Service: Endoscopy;  Laterality: N/A;   COLONOSCOPY WITH PROPOFOL  N/A 05/05/2021   Procedure: COLONOSCOPY WITH PROPOFOL ;  Surgeon: Maryruth Ole DASEN, MD;  Location: [facility] ENDOSCOPY;  Service: Endoscopy;  Laterality: N/A;   CORONARY ANGIOPLASTY WITH STENT PLACEMENT Left 12/08/2010   Procedure: CORONARY ANGIOPLASTY WITH STENT PLACEMENT (pLAD); Procedure: [facility]   ESOPHAGOGASTRODUODENOSCOPY     ESOPHAGOGASTRODUODENOSCOPY (EGD) WITH PROPOFOL  N/A 05/05/2021   Procedure: ESOPHAGOGASTRODUODENOSCOPY (EGD) WITH PROPOFOL ;  Surgeon: Maryruth Ole DASEN, MD;  Location: [facility] ENDOSCOPY;  Service: Endoscopy;  Laterality: N/A;   ESOPHAGOGASTRODUODENOSCOPY (EGD) WITH PROPOFOL  N/A 08/28/2021   Procedure: ESOPHAGOGASTRODUODENOSCOPY (EGD) WITH PROPOFOL ;  Surgeon: Wilhelmenia Aloha Raddle., MD;  Location: [facility];  Service: Gastroenterology;  Laterality: N/A;   ESOPHAGOGASTRODUODENOSCOPY (EGD) WITH PROPOFOL  N/A 08/17/2022   Procedure:  ESOPHAGOGASTRODUODENOSCOPY (EGD) WITH PROPOFOL ;  Surgeon: Maryruth Ole DASEN, MD;  Location: [facility] ENDOSCOPY;  Service: Endoscopy;  Laterality: N/A;   EUS N/A 08/28/2021   Procedure: UPPER ENDOSCOPIC ULTRASOUND (EUS) RADIAL;  Surgeon: Wilhelmenia Aloha Raddle., MD;  Location: [facility];  Service: Gastroenterology;  Laterality: N/A;   EXCISION OF SKIN TAG N/A 03/06/2024   Procedure: EXCISION, SKIN TAG;  Surgeon: Rodolph Romano, MD;  Location: [facility] ORS;  Service: General;  Laterality: N/A;   IR RADIOLOGIST EVAL & MGMT  09/17/2024   LOWER EXTREMITY ANGIOGRAPHY Left 01/10/2024   Procedure: Lower Extremity Angiography;  Surgeon: Jama Cordella MATSU, MD;  Location: [facility];  Service: Cardiovascular;  Laterality: Left;   LUMBAR LAMINECTOMY/DECOMPRESSION MICRODISCECTOMY Left 02/03/2015   Procedure: LUMBAR LAMINECTOMY/DECOMPRESSION MICRODISCECTOMY LEFT LUMBAR FOUR-FIVE WITH REMOVAL OF SYNOVIAL CYST;  Surgeon: Reyes Budge, MD;  Location: [facility];  Service: Neurosurgery;  Laterality: Left;   NASAL SEPTOPLASTY W/ TURBINOPLASTY Bilateral 10/23/2019   Procedure: NASAL SEPTOPLASTY WITH SUBMUCOUS RESECTION OF TURBINATE;  Surgeon: Herminio Miu, MD;  Location: [facility];  Service: ENT;  Laterality: Bilateral;   PELVIC ANGIOGRAPHY N/A  05/02/2018   Procedure: PELVIC ANGIOGRAPHY;  Surgeon: Jama Cordella MATSU, MD;  Location: [facility];  Service: Cardiovascular;  Laterality: N/A;   POLYPECTOMY  08/28/2021   Procedure: POLYPECTOMY;  Surgeon: Wilhelmenia Aloha Raddle., MD;  Location: [facility];  Service: Gastroenterology;;   RECTAL EXAM UNDER ANESTHESIA N/A 03/06/2024   Procedure: ERASMO UNDER ANESTHESIA, RECTUM;  Surgeon: Rodolph Romano, MD;  Location: [facility];  Service: General;  Laterality: N/A;   SHOULDER ARTHROSCOPY WITH SUBACROMIAL DECOMPRESSION AND BICEP TENDON REPAIR Right 12/04/2018   Procedure: SHOULDER ARTHROSCOPY WITH DEBRIDEMENT, DECOMPRESSION,  BICEP TENODESIS;  Surgeon: Edie Norleen PARAS, MD;  Location: [facility];  Service: Orthopedics;  Laterality: Right;   SUBMUCOSAL INJECTION  08/28/2021   Procedure: SUBMUCOSAL INJECTION;  Surgeon: Wilhelmenia Aloha Raddle., MD;  Location: [facility];  Service: Gastroenterology;;   Social History:  reports that he quit smoking about 3 years ago. His smoking use included cigarettes. He started smoking about 33 years ago. He has a 45 pack-year smoking history. He has never used smokeless tobacco. He reports current alcohol use of about 14.0 standard drinks of alcohol per week. He reports that he does not use drugs.  Allergies  Allergen Reactions   Fosamax [Alendronate Sodium]     Leg pain   Percocet [Oxycodone -Acetaminophen ] Nausea And Vomiting    Family History  Problem Relation Age of Onset   Leukemia Mother        dx 66s   Hyperlipidemia Father    Hypertension Father    Diabetes Father    Colon cancer Father        dx 20s   Hearing loss Father    Vascular Disease Father    Lung cancer Brother        d. 77     Prior to Admission medications   Medication Sig Start Date End Date Taking? Authorizing Provider  ALPRAZolam  (XANAX ) 0.5 MG tablet Take 0.5 mg by mouth 3 (three) times daily as needed for anxiety.     [provider]  amLODipine (NORVASC) 2.5 MG tablet Take 5 mg by mouth daily.    [provider]  aspirin  EC 81 MG tablet Take 81 mg by mouth daily.    [provider]  azaTHIOprine (IMURAN) 50 MG tablet Take 125 mg by mouth daily.     [provider]  cyanocobalamin  (,VITAMIN B-12,) 1000 MCG/ML injection Inject 1,000 mcg into the muscle every 14 (fourteen) days.     [provider]  finasteride  (PROSCAR ) 5 MG tablet Take 1 tablet (5 mg total) by mouth daily. 09/09/24   Francisca Redell BROCKS, MD  hydrochlorothiazide  (HYDRODIURIL ) 12.5 MG tablet Take 12.5 mg by mouth daily.    [provider]  hydroxychloroquine (PLAQUENIL) 200 MG tablet  Take 200 mg by mouth 2 (two) times daily.    [provider]  ibuprofen (ADVIL,MOTRIN) 200 MG tablet Take 400 mg by mouth every 8 (eight) hours as needed for headache or moderate pain.    [provider]  metoprolol  succinate (TOPROL -XL) 50 MG 24 hr tablet Take 50 mg by mouth at bedtime. Take with or immediately following a meal.    [provider]  nicotine (NICODERM CQ - DOSED IN MG/24 HOURS) 21 mg/24hr patch Place 21 mg onto the skin daily.    [provider]  nitroGLYCERIN (NITROLINGUAL) 0.4 MG/SPRAY spray Place 1 spray under the tongue every 5 (five) minutes x 3 doses as needed for chest pain.    [provider]  pantoprazole  (PROTONIX ) 20 MG tablet Take 1 tablet (20 mg total) by mouth daily. 01/11/24 01/10/25  Schnier, Cordella MATSU, MD  pravastatin  (PRAVACHOL ) 40 MG tablet Take 40 mg by mouth at bedtime. Patient not taking: Reported on 10/09/2024    [provider]  rosuvastatin  (CRESTOR ) 40 MG tablet Take 40 mg by mouth daily.    [provider]  spironolactone (ALDACTONE) 25 MG tablet Take 25 mg by mouth daily.    [provider]  tadalafil  (CIALIS ) 20 MG tablet Take 1 tablet (20 mg total) by mouth daily as needed for erectile dysfunction. 09/09/24   Francisca Redell BROCKS, MD  tamsulosin  (FLOMAX ) 0.4 MG CAPS capsule Take 1 capsule (0.4 mg total) by mouth daily after breakfast. 09/09/24   Francisca Redell BROCKS, MD    Physical Exam: BP (!) 147/91   Pulse 66   Resp 20   SpO2 100%  General:  Alert, oriented, calm, in no acute distress  Eyes: EOMI, clear conjuctivae, white sclerea Neck: supple, no masses, trachea mildline  Cardiovascular: RRR, no murmurs or rubs, no peripheral edema  Respiratory: clear to auscultation bilaterally, no wheezes, no crackles  Abdomen: soft, nontender, nondistended, normal bowel tones heard  Skin: dry, no rashes  Musculoskeletal: no joint effusions, normal range of motion  Psychiatric: appropriate  affect, normal speech  Neurologic: extraocular muscles intact, clear speech, moving all extremities with intact sensorium         Labs on Admission:  Basic Metabolic Panel: Recent Labs  Lab 10/09/24 0729  NA 138  K 4.0  CL 101  CO2 27  GLUCOSE 113*  BUN 13  CREATININE 0.81  CALCIUM  9.2   Liver Function Tests: No results for input(s): AST, ALT, ALKPHOS, BILITOT, PROT, ALBUMIN in the last 168 hours. No results for input(s): LIPASE, AMYLASE in the last 168 hours. No results for input(s): AMMONIA in the last 168 hours. CBC: Recent Labs  Lab 10/09/24 0729  WBC 4.5  HGB 15.4  HCT 45.0  MCV 97.4  PLT 251   Cardiac Enzymes: No results for input(s): CKTOTAL, CKMB, CKMBINDEX, TROPONINI in the last 168 hours. BNP (last 3 results) No results for input(s): BNP in the last 8760 hours.  ProBNP (last 3 results) No results for input(s): PROBNP in the last 8760 hours.  CBG: No results for input(s): GLUCAP in the last 168 hours.  Radiological Exams on Admission: CT RENAL TUMOR ABLATION UNILATERAL Result Date: 10/09/2024 INDICATION: 73 year old male margin Hansen mass exophytic from the left kidney. Imaging appearance is highly concerning for renal cell carcinoma. Therefore, he presents for concurrent biopsy and percutaneous microwave ablation. EXAM: CT-guided thermal ablation of renal mass CT-guided core biopsy of renal mass COMPARISON:  MRI abdomen 08/18/2019 MEDICATIONS: 2 g Ancef ; The antibiotic was administered in an appropriate time interval prior to needle puncture of the skin. ANESTHESIA/SEDATION: General - as administered by the Anesthesia department FLUOROSCOPY: None. COMPLICATIONS: SIR LEVEL B - Normal therapy, includes overnight admission for observation. TECHNIQUE: Informed written consent was obtained from the patient after a thorough discussion of the procedural risks, benefits and alternatives. All questions were addressed. Maximal Sterile  Barrier Technique was utilized including caps, mask, sterile gowns, sterile gloves, sterile drape, hand hygiene and skin antiseptic. A timeout was performed prior to the initiation of the procedure. CT imaging was performed. The exophytic mass arising from the upper pole of the left kidney was identified. A suitable skin entry site was selected and marked. The skin was sterilely prepped  and draped in the standard fashion using chlorhexidine  skin prep. Local anesthesia was attained by infiltration with 1% lidocaine . A small dermatotomy was made. Under intermittent CT guidance, a 17 gauge 15 cm trocar needle was carefully advanced and positioned at the margin of the lesion. A single core biopsy specimen was obtained, placed in formalin and delivered to pathology for further analysis. Concurrently, a 20 cm PR microwave antenna was carefully advanced and used to pierce the lesion. Micro ablation was then performed. The probe was powered at 65 watts and ablation was maintained for 6 minutes and 50 seconds. Following successful ablation, the antenna was removed. Post ablation CT imaging demonstrates an active subcapsular bleed and complete de enhancement of the treated lesion. The patient was capped on the CT scanner and repeat imaging performed at 10 and 15 minutes. This demonstrates minimal enlargement of the subcapsular hematoma. No evidence of retroperitoneal hemorrhage. Normal contrast excretion. FINDINGS: Small to moderate subcapsular hematoma following percutaneous thermal ablation. IMPRESSION: 1. Successful CT-guided core biopsy of renal mass. 2. Successful CT-guided percutaneous microwave ablation of left renal mass. 3. Post ablation imaging demonstrates a small to moderate subcapsular hematoma. Patient will be admitted for observation and bed rest overnight. Electronically Signed   By: Wilkie Lent M.D.   On: 10/09/2024 11:25   CT RENAL BIOPSY Result Date: 10/09/2024 INDICATION: 73 year old male margin  Hansen mass exophytic from the left kidney. Imaging appearance is highly concerning for renal cell carcinoma. Therefore, he presents for concurrent biopsy and percutaneous microwave ablation. EXAM: CT-guided thermal ablation of renal mass CT-guided core biopsy of renal mass COMPARISON:  MRI abdomen 08/18/2019 MEDICATIONS: 2 g Ancef ; The antibiotic was administered in an appropriate time interval prior to needle puncture of the skin. ANESTHESIA/SEDATION: General - as administered by the Anesthesia department FLUOROSCOPY: None. COMPLICATIONS: SIR LEVEL B - Normal therapy, includes overnight admission for observation. TECHNIQUE: Informed written consent was obtained from the patient after a thorough discussion of the procedural risks, benefits and alternatives. All questions were addressed. Maximal Sterile Barrier Technique was utilized including caps, mask, sterile gowns, sterile gloves, sterile drape, hand hygiene and skin antiseptic. A timeout was performed prior to the initiation of the procedure. CT imaging was performed. The exophytic mass arising from the upper pole of the left kidney was identified. A suitable skin entry site was selected and marked. The skin was sterilely prepped and draped in the standard fashion using chlorhexidine  skin prep. Local anesthesia was attained by infiltration with 1% lidocaine . A small dermatotomy was made. Under intermittent CT guidance, a 17 gauge 15 cm trocar needle was carefully advanced and positioned at the margin of the lesion. A single core biopsy specimen was obtained, placed in formalin and delivered to pathology for further analysis. Concurrently, a 20 cm PR microwave antenna was carefully advanced and used to pierce the lesion. Micro ablation was then performed. The probe was powered at 65 watts and ablation was maintained for 6 minutes and 50 seconds. Following successful ablation, the antenna was removed. Post ablation CT imaging demonstrates an active subcapsular  bleed and complete de enhancement of the treated lesion. The patient was capped on the CT scanner and repeat imaging performed at 10 and 15 minutes. This demonstrates minimal enlargement of the subcapsular hematoma. No evidence of retroperitoneal hemorrhage. Normal contrast excretion. FINDINGS: Small to moderate subcapsular hematoma following percutaneous thermal ablation. IMPRESSION: 1. Successful CT-guided core biopsy of renal mass. 2. Successful CT-guided percutaneous microwave ablation of left renal mass. 3. Post ablation  imaging demonstrates a small to moderate subcapsular hematoma. Patient will be admitted for observation and bed rest overnight. Electronically Signed   By: Wilkie Lent M.D.   On: 10/09/2024 11:25   Assessment/Plan Sergio Callahan is a 73 y.o. male with medical history significant for BPH, hypertension, hyperlipidemia and growing left renal mass being admitted for observation after left renal mass biopsy and microwave ablation.  Small-to-moderate subcapsular hematoma-after CT-guided left renal mass biopsy and percutaneous microwave ablation.  Patient is hemodynamically stable. -Observation admission -Bedrest until the morning of 11/29, with bathroom privileges -Every 6 hours hemoglobin x 3 -Urgent IR consult for angiogram in case of evidence of active bleed -Will remove Foley catheter this afternoon if urine output is acceptable -BP goal less than 160/90  Chronic home medications for hypertension, hyperlipidemia, BPH, etc. will be continued as medically appropriate, once medication reconciliation is complete.  DVT prophylaxis: SCDs only    Code Status: Full Code  Consults called: Discussed with IR Dr. Lent, who will be following along.  Admission status: Observation  Time spent: 42 minutes  Leata Dominy CHRISTELLA Gail MD [facility] Pager [phone]  If 7PM-7AM, please contact night-coverage [email or web address] Password [facility]  10/09/2024, 12:35 PM

## 2024-10-09 NOTE — Progress Notes (Signed)
 Updated wife on patient's room assignment.

## 2024-10-09 NOTE — Anesthesia Preprocedure Evaluation (Addendum)
 Anesthesia Evaluation  Patient identified by MRN, date of birth, ID band Patient awake    Reviewed: Allergy & Precautions, H&P , NPO status , Patient's Chart, lab work & pertinent test results  Airway Mallampati: II  TM Distance: >3 FB Neck ROM: full    Dental no notable dental hx.    Pulmonary Patient abstained from smoking., former smoker   Pulmonary exam normal        Cardiovascular Exercise Tolerance: Poor hypertension, + CAD, + Cardiac Stents and + Peripheral Vascular Disease  Normal cardiovascular exam     Neuro/Psych  PSYCHIATRIC DISORDERS Anxiety      Neuromuscular disease (LEFT lower extremity paresthesias)    GI/Hepatic negative GI ROS, Neg liver ROS,,,  Endo/Other  negative endocrine ROS  History of lupus  Renal/GU Renal cell carcinoma, left (HCC)     Musculoskeletal  (+) Arthritis ,    Abdominal Normal abdominal exam  (+)   Peds  Hematology negative hematology ROS (+)   Anesthesia Other Findings Super D chest CT imaging performed on 07/17/2023 demonstrated a spiculated morphology of a concerning nodule in the RIGHT lower pulmonary lobe.  Repeat CT imaging of the chest performed on 12/09/2023 revealed that the area of concern now was cavitary and spiculated measuring 10 x 7 mm.  Patient was referred for further evaluation with pulmonary medicine to discuss tissue biopsy.  Patient subsequently scheduled for a ROBOTIC ASSISTED NAVIGATIONAL BRONCHOSCOPY (Bilateral) on 12/31/2023 with Dr. Belva November, MD. Given patient's past medical history significant for cardiovascular diagnoses, presurgical cardiac clearance was sought by the PAT team. Per cardiology, this patient is optimized for surgery and may proceed with the planned procedural course with a LOW risk of significant perioperative cardiovascular complications.  Follows with cardiology at [facility] for history of CAD s/p PCI to the LAD in 2012,  peripheral vascular disease s/p stenting to bilateral common iliac arteries, and hypertension.  Nuclear stress test 2019 was low risk.   Former smoker, 45 pack years, quit 2022.  SLE  gastric neuroendocrine carcinoma  Past Medical History: No date: AAA (abdominal aortic aneurysm) (HCC) No date: Abdominal aortic aneurysm without rupture (HCC) No date: Adenomatous colon polyp No date: Allergic rhinitis No date: Anemia No date: Anogenital (venereal) warts No date: Anxiety     Comment:  a.) on BZO PRN (alprazolam ) No date: Aortic atherosclerosis (HCC) No date: Arthritis No date: B12 deficiency No date: Back pain No date: Bilateral adrenal adenomas No date: BPH (benign prostatic hyperplasia) No date: Carpal tunnel syndrome No date: Celiac artery atherosclerosis No date: Coronary artery disease     Comment:  a.) MV 02/26/2005: no ischemia; b.) MV 11/24/2010: mild               inferosep ischemia; c.) LHC/PCI 12/08/2010: 99% pLAD -->               rotational athrectomy + 3.0 x 23 mm Xience DES x 1; d.)               MV 02/15/2011: no ischemia; e.) MV 02/14/2018: no               ischemia No date: Degenerative joint disease No date: DOE (dyspnea on exertion) No date: Duodenitis 05/2023: Elevated liver enzymes No date: Erectile dysfunction     Comment:  a.) on PDE5i (tadalafil ) No date: GERD (gastroesophageal reflux disease) No date: Hepatic steatosis No date: History of shingles No date: History of tobacco abuse No date: HPV in  male No date: Hx of adenomatous colonic polyps No date: Hyperlipidemia No date: Hypertension No date: Hyponatremia No date: Iliac artery aneurysm (HCC) No date: Long term (current) use of aspirin  No date: Long term current use of immunosuppressive drug     Comment:  a.) Tx'd with azathioprine + hydroxychloroquine for SLE No date: Lumbar stenosis No date: MGUS (monoclonal gammopathy of unknown significance) No date: Multiple pulmonary nodules  determined by computed tomography  of lung 2023: Neuroendocrine carcinoma of stomach (HCC) No date: Nocturia 06/2023: Nodule of lower lobe of right lung No date: Paresthesia of left lower extremity No date: Peripheral vascular disease (HCC) 06/20/2021: Right lower lobe pulmonary nodule     Comment:  a.) Dotatate PET 06/20/2021: posterior RLL 0.6 x 0.5 mm:              b.) LDCT chest 07/27/2022: 0.8 x 0.6 mm; c.) Dotatate PET              02/04/2023: 0.7 x 0.7 mm; d.) CT chest 05/13/2023: 8 x 11              mm; e.) super D chest CT 07/17/2023: spiclated morphology              9 x 8 mm; f.) CT chest 12/09/2023: cavitary spiculated               nodule 10 x 7 mm No date: Rotator cuff tendonitis No date: Shingles No date: 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)     Comment:  a.) Tx'd with azathioprine + hydroxychloroquine No date: Urinary frequency  Past Surgical History: No date: BACK SURGERY 08/28/2021: BIOPSY     Comment:  Procedure: BIOPSY;  Surgeon: Wilhelmenia Aloha Raddle.,               MD;  Location: [facility];  Service: Gastroenterology;; 07/18/2023: BRONCHIAL BIOPSY     Comment:  Procedure: BRONCHIAL BIOPSIES;  Surgeon: Isadora Hose,              MD;  Location: [facility];  Service: Pulmonary;; 07/18/2023: BRONCHIAL NEEDLE ASPIRATION BIOPSY     Comment:  Procedure: BRONCHIAL NEEDLE ASPIRATION BIOPSIES;                Surgeon: Isadora Hose, MD;  Location: [facility];                Service: Pulmonary;; 07/18/2023: BRONCHIAL WASHINGS     Comment:  Procedure: BRONCHIAL WASHINGS;  Surgeon: Isadora Hose,              MD;  Location: [facility];  Service: Pulmonary;; 01/27/2021: BROW LIFT; Bilateral     Comment:  Procedure: BLEPHAROPLASTY UPPER EYELID; W/EXCESS SKIN               BROW PTOSIS REPAIR AND BLEPHAROPTOSIS REPAIR; RESECT EX               BILATERAL;  Surgeon: Ashley Greig HERO, MD;  Location: [facility];  Service:  Ophthalmology;  Laterality:               Bilateral; No date: COLONOSCOPY 09/30/2019: COLONOSCOPY WITH PROPOFOL ; N/A     Comment:  Procedure: COLONOSCOPY WITH PROPOFOL ;  Surgeon: Dessa Reyes ORN, MD;  Location: [facility];  Service:  Endoscopy;  Laterality: N/A; 05/05/2021: COLONOSCOPY WITH PROPOFOL ; N/A     Comment:  Procedure: COLONOSCOPY WITH PROPOFOL ;  Surgeon:               Maryruth Ole DASEN, MD;  Location: [facility];                Service: Endoscopy;  Laterality: N/A; 12/08/2010: CORONARY ANGIOPLASTY WITH STENT PLACEMENT; Left     Comment:  Procedure: CORONARY ANGIOPLASTY WITH STENT PLACEMENT               (pLAD); Procedure: [facility] No date: ESOPHAGOGASTRODUODENOSCOPY 05/05/2021: ESOPHAGOGASTRODUODENOSCOPY (EGD) WITH PROPOFOL ; N/A     Comment:  Procedure: ESOPHAGOGASTRODUODENOSCOPY (EGD) WITH               PROPOFOL ;  Surgeon: Maryruth Ole DASEN, MD;  Location:               [facility];  Service: Endoscopy;  Laterality: N/A; 08/28/2021: ESOPHAGOGASTRODUODENOSCOPY (EGD) WITH PROPOFOL ; N/A     Comment:  Procedure: ESOPHAGOGASTRODUODENOSCOPY (EGD) WITH               PROPOFOL ;  Surgeon: Wilhelmenia Aloha Raddle., MD;                Location: [facility];  Service: Gastroenterology;                Laterality: N/A; 08/17/2022: ESOPHAGOGASTRODUODENOSCOPY (EGD) WITH PROPOFOL ; N/A     Comment:  Procedure: ESOPHAGOGASTRODUODENOSCOPY (EGD) WITH               PROPOFOL ;  Surgeon: Maryruth Ole DASEN, MD;  Location:               [facility];  Service: Endoscopy;  Laterality: N/A; 08/28/2021: EUS; N/A     Comment:  Procedure: UPPER ENDOSCOPIC ULTRASOUND (EUS) RADIAL;                Surgeon: Wilhelmenia Aloha Raddle., MD;  Location: [facility]               [facility];  Service: Gastroenterology;  Laterality: N/A; 02/03/2015: LUMBAR LAMINECTOMY/DECOMPRESSION MICRODISCECTOMY; Left     Comment:  Procedure: LUMBAR LAMINECTOMY/DECOMPRESSION                MICRODISCECTOMY LEFT LUMBAR FOUR-FIVE WITH REMOVAL OF               SYNOVIAL CYST;  Surgeon: Reyes Budge, MD;  Location:               [facility];  Service: Neurosurgery;  Laterality: Left; 10/23/2019: NASAL SEPTOPLASTY W/ TURBINOPLASTY; Bilateral     Comment:  Procedure: NASAL SEPTOPLASTY WITH SUBMUCOUS RESECTION OF              TURBINATE;  Surgeon: Herminio Miu, MD;  Location:               [facility];  Service: ENT;  Laterality:               Bilateral; 05/02/2018: PELVIC ANGIOGRAPHY; N/A     Comment:  Procedure: PELVIC ANGIOGRAPHY;  Surgeon: Jama Cordella MATSU, MD;  Location: [facility];  Service:              Cardiovascular;  Laterality: N/A; 08/28/2021: POLYPECTOMY     Comment:  Procedure: POLYPECTOMY;  Surgeon: Wilhelmenia Aloha  Mickey., MD;  Location: [facility];  Service:               Gastroenterology;; 12/04/2018: SHOULDER ARTHROSCOPY WITH SUBACROMIAL DECOMPRESSION AND  BICEP TENDON REPAIR; Right     Comment:  Procedure: SHOULDER ARTHROSCOPY WITH DEBRIDEMENT,               DECOMPRESSION, BICEP TENODESIS;  Surgeon: Edie Norleen PARAS,               MD;  Location: [facility];  Service: Orthopedics;                Laterality: Right; 08/28/2021: SUBMUCOSAL INJECTION     Comment:  Procedure: SUBMUCOSAL INJECTION;  Surgeon: Wilhelmenia Aloha Mickey., MD;  Location: [facility];  Service:               Gastroenterology;;     Reproductive/Obstetrics negative OB ROS                              Anesthesia Physical Anesthesia Plan  ASA: 3  Anesthesia Plan: General   Post-op Pain Management:    Induction: Intravenous  PONV Risk Score and Plan: 2 and Ondansetron , Dexamethasone  and Midazolam   Airway Management Planned: Oral ETT  Additional Equipment:   Intra-op Plan:   Post-operative Plan: Extubation in OR  Informed Consent: I have reviewed the patients History and Physical,  chart, labs and discussed the procedure including the risks, benefits and alternatives for the proposed anesthesia with the patient or authorized representative who has indicated his/her understanding and acceptance.     Dental Advisory Given  Plan Discussed with: CRNA and Surgeon  Anesthesia Plan Comments:          Anesthesia Quick Evaluation

## 2024-10-09 NOTE — Plan of Care (Signed)

## 2024-10-10 DIAGNOSIS — S37012A Minor contusion of left kidney, initial encounter: Secondary | ICD-10-CM | POA: Diagnosis not present

## 2024-10-10 LAB — CBC
HCT: 38.1 % — ABNORMAL LOW (ref 39.0–52.0)
Hemoglobin: 13.2 g/dL (ref 13.0–17.0)
MCH: 33.5 pg (ref 26.0–34.0)
MCHC: 34.6 g/dL (ref 30.0–36.0)
MCV: 96.7 fL (ref 80.0–100.0)
Platelets: 222 K/uL (ref 150–400)
RBC: 3.94 MIL/uL — ABNORMAL LOW (ref 4.22–5.81)
RDW: 13.1 % (ref 11.5–15.5)
WBC: 11.6 K/uL — ABNORMAL HIGH (ref 4.0–10.5)
nRBC: 0 % (ref 0.0–0.2)

## 2024-10-10 LAB — BASIC METABOLIC PANEL WITH GFR
Anion gap: 9 (ref 5–15)
BUN: 18 mg/dL (ref 8–23)
CO2: 26 mmol/L (ref 22–32)
Calcium: 9 mg/dL (ref 8.9–10.3)
Chloride: 100 mmol/L (ref 98–111)
Creatinine, Ser: 1.01 mg/dL (ref 0.61–1.24)
GFR, Estimated: >60 mL/min (ref >60)
Glucose, Bld: 106 mg/dL — ABNORMAL HIGH (ref 70–99)
Potassium: 4.1 mmol/L (ref 3.5–5.1)
Sodium: 136 mmol/L (ref 135–145)

## 2024-10-10 LAB — HEMOGLOBIN AND HEMATOCRIT, BLOOD
HCT: 37.2 % — ABNORMAL LOW (ref 39.0–52.0)
Hemoglobin: 12.7 g/dL — ABNORMAL LOW (ref 13.0–17.0)

## 2024-10-10 LAB — HIV ANTIBODY (ROUTINE TESTING W REFLEX): HIV Screen 4th Generation wRfx: NONREACTIVE

## 2024-10-10 MED ORDER — OXYCODONE HCL 10 MG PO TABS: 10.0000 mg | ORAL_TABLET | Freq: Four times a day (QID) | ORAL | 0 refills | Status: AC | PRN

## 2024-10-10 NOTE — Discharge Summary (Signed)
 Physician Discharge Summary   Patient: Sergio Callahan MRN: [id number] DOB: 29-Jul-1951  Admit date:     10/09/2024  Discharge date: 10/10/24  Discharge Physician: Ashten Sarnowski   PCP: Rudolpho Norleen BIRCH, MD   Recommendations at discharge:   Patient advised to limit strenuous activity over the next 3 days May resume work on Wednesday 12/3.  No lifting restriction Return to the emergency room for worsening left flank or generalized back pain Follow-up with IR in 2 to 3 weeks  Discharge Diagnoses: Principal Problem:   Renal hematoma, left, initial encounter Active Problems:   Atherosclerosis of native arteries of extremity with intermittent claudication   Lupus erythematosus   Essential hypertension   DJD (degenerative joint disease)   Benign localized hyperplasia of prostate with urinary obstruction  Resolved Problems:   * No resolved hospital problems. *  Hospital Course:  Sergio Callahan is a 73 y.o. male with medical history significant for BPH, hypertension, hyperlipidemia and growing left renal mass being admitted for observation after left renal mass biopsy and microwave ablation.  Patient is resting comfortably when seen in his room on the MedSurg floor, he has no complaints other than some very minimal left-sided soreness.  Denies any fevers, chills, nausea, vomiting or any other complaints.     Assessment and Plan:  Left renal hematoma Following left renal mass biopsy and percutaneous microwave ablation Hemoglobin is stable at 12.7 on discharge with improvement in patient's pain Patient was seen and evaluated by IR, who recommends discharge home with  outpatient follow-up Instructions given to patient to return to ER if worsening left flank or back pain    Hypertension Continue amlodipine  and metoprolol     History of lupus Continue hydroxychloroquine    Coronary artery disease Continue metoprolol  and statins Aspirin  placed on hold due to left renal  hematoma May be resumed by patient's PCP    BPH Continue Flomax          Consultants: Interventional radiology Procedures performed: None Disposition: Home Diet recommendation:  Discharge Diet Orders (From admission, onward)     Start     Ordered   10/10/24 0000  Diet - low sodium heart healthy        10/10/24 1059           Cardiac diet DISCHARGE MEDICATION: Allergies as of 10/10/2024       Reactions   Fosamax [alendronate Sodium]    Leg pain   Percocet [oxycodone -acetaminophen ] Nausea And Vomiting        Medication List     PAUSE taking these medications    aspirin  EC 81 MG tablet Wait to take this until your doctor or other care provider tells you to start again. Take 81 mg by mouth daily.       STOP taking these medications    cyanocobalamin  1000 MCG/ML injection Commonly known as: VITAMIN B12   ibuprofen 200 MG tablet Commonly known as: ADVIL   nicotine 21 mg/24hr patch Commonly known as: NICODERM CQ - dosed in mg/24 hours   pravastatin  40 MG tablet Commonly known as: PRAVACHOL    spironolactone 25 MG tablet Commonly known as: ALDACTONE       TAKE these medications    ALPRAZolam  0.5 MG tablet Commonly known as: XANAX  Take 0.5 mg by mouth 3 (three) times daily as needed for anxiety.   amLODipine  2.5 MG tablet Commonly known as: NORVASC  Take 5 mg by mouth daily.   azaTHIOprine 50 MG tablet Commonly known as: IMURAN Take  125 mg by mouth daily.   finasteride  5 MG tablet Commonly known as: PROSCAR  Take 1 tablet (5 mg total) by mouth daily.   hydrochlorothiazide  12.5 MG tablet Commonly known as: HYDRODIURIL  Take 12.5 mg by mouth daily.   hydroxychloroquine 200 MG tablet Commonly known as: PLAQUENIL Take 200 mg by mouth 2 (two) times daily.   metoprolol  succinate 50 MG 24 hr tablet Commonly known as: TOPROL -XL Take 50 mg by mouth at bedtime. Take with or immediately following a meal.   nitroGLYCERIN 0.4 MG/SPRAY  spray Commonly known as: NITROLINGUAL Place 1 spray under the tongue every 5 (five) minutes x 3 doses as needed for chest pain.   Oxycodone  HCl 10 MG Tabs Take 1 tablet (10 mg total) by mouth every 6 (six) hours as needed for up to 3 days for severe pain (pain score 7-10).   pantoprazole  20 MG tablet Commonly known as: Protonix  Take 1 tablet (20 mg total) by mouth daily.   rosuvastatin 40 MG tablet Commonly known as: CRESTOR Take 40 mg by mouth daily.   tadalafil  20 MG tablet Commonly known as: Cialis  Take 1 tablet (20 mg total) by mouth daily as needed for erectile dysfunction.   tamsulosin  0.4 MG Caps capsule Commonly known as: FLOMAX  Take 1 capsule (0.4 mg total) by mouth daily after breakfast.        Follow-up Information     [facility]. Schedule an appointment as soon as possible for a visit in 3 week(s).   Specialty: Radiology Why: IR schedulers will contact you to schedule a follow-up appointment with Dr. Karalee in clinic in 2-3 week. You may reach out if you have not heard from schedulers after 2 weeks from discharge. Contact information: [address] [employer] [address] [place]  [address] [phone]               Discharge Exam: Fredricka Weights   10/09/24 2031  Weight: 86.6 kg   General:  Alert, oriented, calm, in no acute distress  Eyes: EOMI, clear conjuctivae, white sclerea Neck: supple, no masses, trachea mildline  Cardiovascular: RRR, no murmurs or rubs, no peripheral edema  Respiratory: clear to auscultation bilaterally, no wheezes, no crackles  Abdomen: soft, nontender, nondistended, normal bowel tones heard  Skin: dry, no rashes  Musculoskeletal: no joint effusions, normal range of motion, mild left CVA tenderness Psychiatric: appropriate affect, normal speech  Neurologic: extraocular muscles intact, clear speech, moving all extremities with intact sensorium     Condition at discharge:  stable  The results of significant diagnostics from this hospitalization (including imaging, microbiology, ancillary and laboratory) are listed below for reference.   Imaging Studies: CT RENAL TUMOR ABLATION UNILATERAL Result Date: 10/09/2024 INDICATION: 73 year old male margin Hansen mass exophytic from the left kidney. Imaging appearance is highly concerning for renal cell carcinoma. Therefore, he presents for concurrent biopsy and percutaneous microwave ablation. EXAM: CT-guided thermal ablation of renal mass CT-guided core biopsy of renal mass COMPARISON:  MRI abdomen 08/18/2019 MEDICATIONS: 2 g Ancef ; The antibiotic was administered in an appropriate time interval prior to needle puncture of the skin. ANESTHESIA/SEDATION: General - as administered by the Anesthesia department FLUOROSCOPY: None. COMPLICATIONS: SIR LEVEL B - Normal therapy, includes overnight admission for observation. TECHNIQUE: Informed written consent was obtained from the patient after a thorough discussion of the procedural risks, benefits and alternatives. All questions were addressed. Maximal Sterile Barrier Technique was utilized including caps, mask, sterile gowns, sterile gloves, sterile drape, hand hygiene and  skin antiseptic. A timeout was performed prior to the initiation of the procedure. CT imaging was performed. The exophytic mass arising from the upper pole of the left kidney was identified. A suitable skin entry site was selected and marked. The skin was sterilely prepped and draped in the standard fashion using chlorhexidine  skin prep. Local anesthesia was attained by infiltration with 1% lidocaine . A small dermatotomy was made. Under intermittent CT guidance, a 17 gauge 15 cm trocar needle was carefully advanced and positioned at the margin of the lesion. A single core biopsy specimen was obtained, placed in formalin and delivered to pathology for further analysis. Concurrently, a 20 cm PR microwave antenna was  carefully advanced and used to pierce the lesion. Micro ablation was then performed. The probe was powered at 65 watts and ablation was maintained for 6 minutes and 50 seconds. Following successful ablation, the antenna was removed. Post ablation CT imaging demonstrates an active subcapsular bleed and complete de enhancement of the treated lesion. The patient was capped on the CT scanner and repeat imaging performed at 10 and 15 minutes. This demonstrates minimal enlargement of the subcapsular hematoma. No evidence of retroperitoneal hemorrhage. Normal contrast excretion. FINDINGS: Small to moderate subcapsular hematoma following percutaneous thermal ablation. IMPRESSION: 1. Successful CT-guided core biopsy of renal mass. 2. Successful CT-guided percutaneous microwave ablation of left renal mass. 3. Post ablation imaging demonstrates a small to moderate subcapsular hematoma. Patient will be admitted for observation and bed rest overnight. Electronically Signed   By: Wilkie Lent M.D.   On: 10/09/2024 11:25   CT RENAL BIOPSY Result Date: 10/09/2024 INDICATION: 73 year old male margin Hansen mass exophytic from the left kidney. Imaging appearance is highly concerning for renal cell carcinoma. Therefore, he presents for concurrent biopsy and percutaneous microwave ablation. EXAM: CT-guided thermal ablation of renal mass CT-guided core biopsy of renal mass COMPARISON:  MRI abdomen 08/18/2019 MEDICATIONS: 2 g Ancef ; The antibiotic was administered in an appropriate time interval prior to needle puncture of the skin. ANESTHESIA/SEDATION: General - as administered by the Anesthesia department FLUOROSCOPY: None. COMPLICATIONS: SIR LEVEL B - Normal therapy, includes overnight admission for observation. TECHNIQUE: Informed written consent was obtained from the patient after a thorough discussion of the procedural risks, benefits and alternatives. All questions were addressed. Maximal Sterile Barrier Technique was  utilized including caps, mask, sterile gowns, sterile gloves, sterile drape, hand hygiene and skin antiseptic. A timeout was performed prior to the initiation of the procedure. CT imaging was performed. The exophytic mass arising from the upper pole of the left kidney was identified. A suitable skin entry site was selected and marked. The skin was sterilely prepped and draped in the standard fashion using chlorhexidine  skin prep. Local anesthesia was attained by infiltration with 1% lidocaine . A small dermatotomy was made. Under intermittent CT guidance, a 17 gauge 15 cm trocar needle was carefully advanced and positioned at the margin of the lesion. A single core biopsy specimen was obtained, placed in formalin and delivered to pathology for further analysis. Concurrently, a 20 cm PR microwave antenna was carefully advanced and used to pierce the lesion. Micro ablation was then performed. The probe was powered at 65 watts and ablation was maintained for 6 minutes and 50 seconds. Following successful ablation, the antenna was removed. Post ablation CT imaging demonstrates an active subcapsular bleed and complete de enhancement of the treated lesion. The patient was capped on the CT scanner and repeat imaging performed at 10 and 15 minutes. This demonstrates minimal  enlargement of the subcapsular hematoma. No evidence of retroperitoneal hemorrhage. Normal contrast excretion. FINDINGS: Small to moderate subcapsular hematoma following percutaneous thermal ablation. IMPRESSION: 1. Successful CT-guided core biopsy of renal mass. 2. Successful CT-guided percutaneous microwave ablation of left renal mass. 3. Post ablation imaging demonstrates a small to moderate subcapsular hematoma. Patient will be admitted for observation and bed rest overnight. Electronically Signed   By: Wilkie Lent M.D.   On: 10/09/2024 11:25   IR Radiologist Eval & Mgmt Result Date: 09/17/2024 EXAM: NEW PATIENT OFFICE VISIT CHIEF COMPLAINT:  SEE NOTE IN EPIC HISTORY OF PRESENT ILLNESS: SEE NOTE IN EPIC REVIEW OF SYSTEMS: SEE NOTE IN EPIC PHYSICAL EXAMINATION: SEE NOTE IN EPIC ASSESSMENT AND PLAN: SEE NOTE IN EPIC Electronically Signed   By: Wilkie Lent M.D.   On: 09/17/2024 09:11    Microbiology: Results for orders placed or performed during the hospital encounter of 07/18/23  Fungus Culture With Stain     Status: None   Collection Time: 07/18/23  8:12 AM   Specimen: PATH Cytology Navigation; Tissue  Result Value Ref Range Status   Fungus Stain Final report  Final   Fungus (Mycology) Culture Final report  Final    Comment: (NOTE) Performed At: [facility] [address] [place], [place] [id number] Jennette Shorter MD Ey:[id number]    Fungal Source BRONCHIAL ALVEOLAR LAVAGE  Final    Comment: Performed at [facility], [address] [place], [place] [address]  Aerobic/Anaerobic Culture w Gram Stain (surgical/deep wound)     Status: None   Collection Time: 07/18/23  8:12 AM   Specimen: PATH Cytology Navigation; Tissue  Result Value Ref Range Status   Specimen Description BRONCHIAL ALVEOLAR LAVAGE  Final   Special Requests RLL needle aspirate  Final   Gram Stain NO WBC SEEN NO ORGANISMS SEEN   Final   Culture   Final    No growth aerobically or anaerobically. Performed at [facility], [address], [place], [place] [address]    Report Status 07/23/2023 FINAL  Final  Acid Fast Culture with reflexed sensitivities     Status: None   Collection Time: 07/18/23  8:12 AM   Specimen: PATH Cytology Navigation; Tissue  Result Value Ref Range Status   Acid Fast Culture Negative  Final    Comment: (NOTE) No acid fast bacilli isolated after 6 weeks. Performed At: [facility] [address] [place], [place] [id number] Jennette Shorter MD Ey:[id number]    Source of Sample BRONCHIAL ALVEOLAR LAVAGE  Final    Comment: Performed at [facility], [address] [place], [place] [address]   Acid Fast Smear (AFB)     Status: None   Collection Time: 07/18/23  8:12 AM   Specimen: PATH Cytology Navigation; Tissue  Result Value Ref Range Status   AFB Specimen Processing Concentration  Final   Acid Fast Smear Negative  Final    Comment: (NOTE) Performed At: [facility] [address] [place], [place] [id number] Jennette Shorter MD Ey:[id number]    Source (AFB) BRONCHIAL ALVEOLAR LAVAGE  Final    Comment: Performed at [facility], [address] [place], [place] [address]  Fungus Culture Result     Status: None   Collection Time: 07/18/23  8:12 AM  Result Value Ref Range Status   Result 1 Comment  Final    Comment: (NOTE) KOH/Calcofluor preparation:  no fungus observed. Performed At: [facility] [address] [place],  [place] [id number] Jennette Shorter MD Ey:[id number]   Fungal organism reflex     Status: None   Collection Time: 07/18/23  8:12 AM  Result Value Ref Range Status   Fungal result 1 Comment  Final    Comment: (NOTE) No yeast or mold isolated after 4 weeks. Performed At: [facility] [address] [place], [place] [id number] Jennette Shorter MD Ey:[id number]   Fungus Culture With Stain     Status: None   Collection Time: 07/18/23  8:29 AM   Specimen: Bronchial Alveolar Lavage; Respiratory  Result Value Ref Range Status   Fungus Stain Final report  Final   Fungus (Mycology) Culture Final report  Final    Comment: (NOTE) Performed At: [facility] [address] [place], [place] [id number] Jennette Shorter MD Ey:[id number]    Fungal Source BRONCHIAL ALVEOLAR LAVAGE  Final    Comment: Performed at [facility], [address], [place], [place] [address]  Culture, BAL-quantitative w Gram Stain     Status: None   Collection Time: 07/18/23  8:29 AM   Specimen: Bronchial Alveolar Lavage; Respiratory  Result Value Ref Range Status   Specimen Description BRONCHIAL ALVEOLAR LAVAGE  Final   Special Requests RLL BAL   Final   Gram Stain NO WBC SEEN NO ORGANISMS SEEN   Final   Culture   Final    NO GROWTH 2 DAYS Performed at [facility], [address], [place], [place] [address]    Report Status 07/20/2023 FINAL  Final  Aerobic/Anaerobic Culture w Gram Stain (surgical/deep wound)     Status: None   Collection Time: 07/18/23  8:29 AM   Specimen: Bronchial Alveolar Lavage; Respiratory  Result Value Ref Range Status   Specimen Description BRONCHIAL ALVEOLAR LAVAGE  Final   Special Requests RLL BAL  Final   Gram Stain NO WBC SEEN NO ORGANISMS SEEN   Final   Culture   Final    No growth aerobically or anaerobically. Performed at [facility], [address] [place], [place] [address]    Report Status 07/23/2023 FINAL  Final  Acid Fast Culture with reflexed sensitivities     Status: None   Collection Time: 07/18/23  8:29 AM   Specimen: Bronchial Alveolar Lavage; Respiratory  Result Value Ref Range Status   Acid Fast Culture Negative  Final    Comment: (NOTE) No acid fast bacilli isolated after 6 weeks. Performed At: [facility] [address] [place], [place] [id number] Jennette Shorter MD Ey:[id number]    Source of Sample BRONCHIAL ALVEOLAR LAVAGE  Final    Comment: Performed at [facility], [address], [place], [place] [address]  Acid Fast Smear (AFB)     Status: None   Collection Time: 07/18/23  8:29 AM   Specimen: Bronchial Alveolar Lavage; Respiratory  Result Value Ref Range Status   AFB Specimen Processing Concentration  Final   Acid Fast Smear Negative  Final    Comment: (NOTE) Performed At: [facility] [address] [place], [place] [id number] Jennette Shorter MD Ey:[id number]    Source (AFB) BRONCHIAL ALVEOLAR LAVAGE  Final    Comment: Performed at [facility], [address], [place], [place] [address]  Fungus Culture Result     Status: None   Collection Time: 07/18/23  8:29 AM  Result Value Ref Range Status   Result 1 Comment   Final    Comment: (NOTE) KOH/Calcofluor preparation:  no fungus observed.  Performed At: [facility] [address] [place], [place] [id number] Jennette Shorter MD Ey:[id number]   Fungal organism reflex     Status: None   Collection Time: 07/18/23  8:29 AM  Result Value Ref Range Status   Fungal result 1 Comment  Final    Comment: (NOTE) No yeast or mold isolated after 4 weeks. Performed At: [facility] [address] [place], [place] [id number] Jennette Shorter MD Ey:[id number]     Labs: CBC: Recent Labs  Lab 10/09/24 0729 10/09/24 1224 10/09/24 1823 10/09/24 2356 10/10/24 0552  WBC 4.5  --   --   --  11.6*  HGB 15.4 14.1 13.8 12.7* 13.2  HCT 45.0 41.5 39.0 37.2* 38.1*  MCV 97.4  --   --   --  96.7  PLT 251  --   --   --  222   Basic Metabolic Panel: Recent Labs  Lab 10/09/24 0729 10/10/24 0552  NA 138 136  K 4.0 4.1  CL 101 100  CO2 27 26  GLUCOSE 113* 106*  BUN 13 18  CREATININE 0.81 1.01  CALCIUM 9.2 9.0   Liver Function Tests: No results for input(s): AST, ALT, ALKPHOS, BILITOT, PROT, ALBUMIN in the last 168 hours. CBG: No results for input(s): GLUCAP in the last 168 hours.  Discharge time spent: greater than 30 minutes.  Signed: Aimee Somerset, MD [facility] 10/10/2024

## 2024-10-10 NOTE — Progress Notes (Addendum)
 Referring Provider(s): Dr. 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  Brief History: 73 y.o. male with incidental finding of left renal mass, enlarging on serial imaging. Referred by Urology for renal mass ablation.  Subjective:  Patient alert and laying in bed, calm.  Currently without any significant complaints. He is eager to go home. He feels well, and has not needed pain medication since midnight. He notes mild soreness on left back/side with movement. Patient denies any fevers, headache, chest pain, SOB, cough, abdominal pain, nausea, vomiting or bleeding.    Allergies: Fosamax [alendronate sodium] and Percocet [oxycodone -acetaminophen ]  Medications: Prior to Admission medications   Medication Sig Start Date End Date Taking? Authorizing Provider  ALPRAZolam  (XANAX ) 0.5 MG tablet Take 0.5 mg by mouth 3 (three) times daily as needed for anxiety.     [provider]  amLODipine (NORVASC) 2.5 MG tablet Take 5 mg by mouth daily.    [provider]  aspirin  EC 81 MG tablet Take 81 mg by mouth daily.    [provider]  azaTHIOprine (IMURAN) 50 MG tablet Take 125 mg by mouth daily.     [provider]  cyanocobalamin  (,VITAMIN B-12,) 1000 MCG/ML injection Inject 1,000 mcg into the muscle every 14 (fourteen) days.     [provider]  finasteride  (PROSCAR ) 5 MG tablet Take 1 tablet (5 mg total) by mouth daily. 09/09/24   Callahan Sergio Callahan BROCKS, MD  hydrochlorothiazide  (HYDRODIURIL ) 12.5 MG tablet Take 12.5 mg by mouth daily.    [provider]  hydroxychloroquine (PLAQUENIL) 200 MG tablet Take 200 mg by mouth 2 (two) times daily.    [provider]  ibuprofen (ADVIL,MOTRIN) 200 MG tablet Take 400 mg by mouth every 8 (eight) hours as needed for  headache or moderate pain.    [provider]  metoprolol  succinate (TOPROL -XL) 50 MG 24 hr tablet Take 50 mg by mouth at bedtime. Take with or immediately following a meal.    [provider]  nicotine (NICODERM CQ - DOSED IN MG/24 HOURS) 21 mg/24hr patch Place 21 mg onto the skin daily.    [provider]  nitroGLYCERIN (NITROLINGUAL) 0.4 MG/SPRAY spray Place 1 spray under the tongue every 5 (five) minutes x 3 doses as needed for chest pain.    [provider]  pantoprazole  (PROTONIX ) 20 MG tablet Take 1 tablet (20 mg total) by mouth daily. 01/11/24 01/10/25  Sergio Callahan Callahan, Sergio Callahan MATSU, MD  pravastatin  (PRAVACHOL ) 40 MG tablet Take 40 mg by mouth at bedtime. Patient not taking: Reported on 10/09/2024    [provider]  rosuvastatin (CRESTOR) 40 MG tablet Take 40 mg by mouth daily.    [provider]  spironolactone (ALDACTONE) 25 MG tablet Take 25 mg by mouth daily.    [provider]  tadalafil  (CIALIS ) 20 MG tablet Take 1 tablet (20 mg total) by mouth daily as needed for erectile dysfunction. 09/09/24   Callahan Sergio Callahan BROCKS, MD  tamsulosin  (FLOMAX ) 0.4 MG CAPS capsule Take 1 capsule (0.4 mg total) by mouth daily after breakfast. 09/09/24   Callahan Sergio Callahan BROCKS, MD     Vital Signs: BP 138/83 (BP Location: Left Arm)   Pulse 75   Temp 98.2 F (36.8 C)   Resp 17   Ht 6' (1.829 m)   Wt 190 lb  14.7 oz (86.6 kg)   SpO2 96%   BMI 25.89 kg/m   Physical Exam Constitutional:      Appearance: Normal appearance.  Cardiovascular:     Rate and Rhythm: Normal rate and regular rhythm.     Pulses: Normal pulses.     Heart sounds: Normal heart sounds.  Pulmonary:     Effort: Pulmonary effort is normal.     Breath sounds: Normal breath sounds.  Abdominal:     General: Abdomen is flat. There is no distension.     Palpations: Abdomen is soft.     Tenderness: There is no abdominal tenderness.  Musculoskeletal:        General: Normal range of motion.      Comments: No right-sided CVA tenderness to palpation Minimal left-sided CVA tenderness to palpation, with radiation to left lower flank. No bruising, swelling, visible bleeding, crepitus, induration noted.  Skin:    General: Skin is warm and dry.  Neurological:     Mental Status: He is alert and oriented to person, place, and time.      Labs:  CBC: Recent Labs    01/14/24 1023 01/14/24 1023 10/09/24 0729 10/09/24 1224 10/09/24 1823 10/09/24 2356 10/10/24 0552  WBC 5.1  --  4.5  --   --   --  11.6*  HGB 15.3   < > 15.4 14.1 13.8 12.7* 13.2  HCT 44.3  --  45.0 41.5 39.0 37.2* 38.1*  PLT 235  --  251  --   --   --  222   < > = values in this interval not displayed.    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  Patient without complaints overnight. Mild discomfort on left CVA with radiation to left lower flank, favored by patient to represent muscle soreness, 1/10, per his report. Last pain medication around midnight. None needed this AM. Examination without acute findings. Reviewed with patient he should limit strenuous activity over the next 3 days. He may resume activities of daily living per baseline on discharge. He may resume work on Wednesday 12/3. No lifting restriction beginning 12/3. Reviewed with patient to present emergently to ED with any concerns for sudden onset of or worsening left  flank pain or generalized back pain, as this may represent recurrent renal bleeding. Patient acknowledge and voice understanding.  IR will follow-up as outpatient in 2-3 weeks. IR discharge orders placed.  Per Dr. Karalee, VIR attending, patient is stable and may discharge this AM.   Thank you for this interesting consult.  I greatly enjoyed meeting Sergio Callahan Callahan and look forward to participating in their care.   Electronically Signed: Carlin DELENA Griffon,  PA-C 10/10/2024, 8:49 AM     I spent a total of 15 Minutes at the the patient's bedside AND on the patient's hospital floor or unit, greater than 50% of which was counseling/coordinating care for left renal mass s/p biopsy and microwave ablation, complicated by limited subcapsular hematoma.

## 2024-10-10 NOTE — Plan of Care (Signed)

## 2024-10-12 NOTE — Anesthesia Postprocedure Evaluation (Signed)
 Anesthesia Post Note  Patient: Sergio Callahan  Procedure(s) Performed: CT RENAL TUMOR ABLATION UNILATERAL CT RENAL BIOPSY  Patient location during evaluation: PACU Anesthesia Type: General Level of consciousness: awake and alert Pain management: pain level controlled Vital Signs Assessment: post-procedure vital signs reviewed and stable Respiratory status: spontaneous breathing, nonlabored ventilation and respiratory function stable Cardiovascular status: blood pressure returned to baseline and stable Postop Assessment: no apparent nausea or vomiting Anesthetic complications: no   No notable events documented.   Last Vitals:  Vitals:   10/09/24 1131 10/09/24 1621  BP: (!) 146/91 122/85  Pulse: (!) 132 98  Resp: 13 17  Temp:  36.8 C  SpO2: 99% 97%    Last Pain:  Vitals:   10/09/24 1107  TempSrc:   PainSc: 4                  Camellia Merilee Louder

## 2024-10-13 LAB — SURGICAL PATHOLOGY

## 2024-10-27 ENCOUNTER — Inpatient Hospital Stay: Admission: RE | Admit: 2024-10-27 | Discharge: 2024-10-27 | Attending: Urology

## 2024-10-27 DIAGNOSIS — N2889 Other specified disorders of kidney and ureter: Secondary | ICD-10-CM

## 2024-10-27 DIAGNOSIS — S37012A Minor contusion of left kidney, initial encounter: Secondary | ICD-10-CM

## 2024-10-27 HISTORY — PX: IR RADIOLOGIST EVAL & MGMT: IMG5224

## 2024-10-27 NOTE — Progress Notes (Signed)
 Chief Complaint: Patient was seen in consultation today for left renal low-grade oncocytic tumor (LOT) at the request of Carim,Charles A  Referring Physician(s): Carim,Charles A  History of Present Illness: Sergio Callahan is a 73 y.o. male with a history of an incidentally identified 1.3 cm enhancing mass arising from the left kidney in April 2025.  He initially elected for surveillance and a follow-up MRI was performed 6 months later in October.  The mass had significantly increased in size to 2.0 cm.  These findings were concerning for renal cell carcinoma.   He underwent simultaneous CT guided biopsy and microwave ablation on 10/09/24.  The procedure was complicated by a moderate subcapsular hematoma at the end of the procedure.  He was admitted overnight for bedrest and observation and discharged the next day.  No need for transfusion or intervention.   He presents to clinic today for his 2 week f/u.  As expected, he had some left flank discomfort for a few days after the procedure but this has completely resolved.  No hematuria or dysuria.  His biggest complaint is that he had constipation for 6 days following the procedure.  This has also since resolved.  He is now feeling fine and fully recovered.    He was thrilled to hear about the benign biopsy result.   Past Medical History:  Diagnosis Date   AAA (abdominal aortic aneurysm)    Abdominal aortic aneurysm without rupture    Adenoma of right adrenal gland    Adenomatous colon polyp    Allergic rhinitis    Anemia    Anogenital (venereal) warts    Anxiety    a.) on BZO PRN (alprazolam )   Aortic atherosclerosis    Aortic atherosclerosis    Arthritis    Atherosclerosis of native artery of right lower extremity with intermittent claudication    B12 deficiency    Back pain    Bilateral adrenal adenomas    BPH (benign prostatic hyperplasia)    Carpal tunnel syndrome    Celiac artery atherosclerosis    Coronary artery  disease    a.) MV 02/26/2005: no ischemia; b.) MV 11/24/2010: mild inferosep ischemia; c.) LHC/PCI 12/08/2010: 99% pLAD --> rotational athrectomy + 3.0 x 23 mm Xience DES x 1; d.) MV 02/15/2011: no ischemia; e.) MV 02/14/2018: no ischemia   Daily consumption of alcohol    Degenerative joint disease    DOE (dyspnea on exertion)    Duodenitis    Dyspnea on exertion    Erectile dysfunction    a.) on PDE5i (tadalafil )   GERD (gastroesophageal reflux disease)    Hepatic steatosis    History of kidney stones    History of shingles    History of tobacco abuse    HPV in male    Hx of adenomatous colonic polyps    Hyperlipidemia    Hypertension    Hyponatremia    Iliac artery aneurysm    Long term (current) use of aspirin     Long term current use of immunosuppressive drug    a.) Tx'd with azathioprine + hydroxychloroquine  for SLE   Lumbar stenosis    MGUS (monoclonal gammopathy of unknown significance)    Multiple pulmonary nodules determined by computed tomography of lung    Neuroendocrine carcinoma of stomach (HCC) 2023   Nocturia    Nodule of lower lobe of right lung 06/2023   Paresthesia of left lower extremity    Peripheral vascular disease    Renal  mass    Right lower lobe pulmonary nodule 06/20/2021   a.) Dotatate PET 06/20/2021: posterior RLL 0.6 x 0.5 mm: b.) LDCT chest 07/27/2022: 0.8 x 0.6 mm; c.) Dotatate PET 02/04/2023: 0.7 x 0.7 mm; d.) CT chest 05/13/2023: 8 x 11 mm; e.) super D chest CT 07/17/2023: spiclated morphology 9 x 8 mm; f.) CT chest 12/09/2023: cavitary spiculated nodule 10 x 7 mm   Rotator cuff tendonitis    Shingles    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)    a.) Tx'd with azathioprine + hydroxychloroquine    Status post bilateral cataract extraction 08/2024   Urinary frequency     Past Surgical History:  Procedure Laterality Date   BACK SURGERY     BIOPSY  08/28/2021   Procedure: BIOPSY;  Surgeon: Wilhelmenia Aloha Raddle., MD;   Location: [facility];  Service: Gastroenterology;;   BRONCHIAL BIOPSY  07/18/2023   Procedure: BRONCHIAL BIOPSIES;  Surgeon: Isadora Hose, MD;  Location: [facility];  Service: Pulmonary;;   BRONCHIAL BIOPSY Right    BRONCHIAL BIOPSY Right 12/31/2023   BRONCHIAL NEEDLE ASPIRATION BIOPSY  07/18/2023   Procedure: BRONCHIAL NEEDLE ASPIRATION BIOPSIES;  Surgeon: Isadora Hose, MD;  Location: [facility];  Service: Pulmonary;;   BRONCHIAL WASHINGS  07/18/2023   Procedure: BRONCHIAL WASHINGS;  Surgeon: Isadora Hose, MD;  Location: [facility];  Service: Pulmonary;;   BROW LIFT Bilateral 01/27/2021   Procedure: BLEPHAROPLASTY UPPER EYELID; W/EXCESS SKIN BROW PTOSIS REPAIR AND BLEPHAROPTOSIS REPAIR; RESECT EX BILATERAL;  Surgeon: Ashley Greig HERO, MD;  Location: [facility];  Service: Ophthalmology;  Laterality: Bilateral;   carpel tunnel syndrome right      CATARACT EXTRACTION W/PHACO Right 08/19/2024   Procedure: PHACOEMULSIFICATION, CATARACT, WITH IOL INSERTION 7.83, 00:36.0;  Surgeon: Mittie Gaskin, MD;  Location: [facility];  Service: Ophthalmology;  Laterality: Right;   CATARACT EXTRACTION W/PHACO Left 09/02/2024   Procedure: PHACOEMULSIFICATION, CATARACT, WITH IOL INSERTION 4.60 00:41.3;  Surgeon: Mittie Gaskin, MD;  Location: [facility];  Service: Ophthalmology;  Laterality: Left;   COLONOSCOPY     COLONOSCOPY WITH PROPOFOL  N/A 09/30/2019   Procedure: COLONOSCOPY WITH PROPOFOL ;  Surgeon: Dessa Reyes ORN, MD;  Location: [facility];  Service: Endoscopy;  Laterality: N/A;   COLONOSCOPY WITH PROPOFOL  N/A 05/05/2021   Procedure: COLONOSCOPY WITH PROPOFOL ;  Surgeon: Maryruth Ole DASEN, MD;  Location: [facility];  Service: Endoscopy;  Laterality: N/A;   CORONARY ANGIOPLASTY WITH STENT PLACEMENT Left 12/08/2010   Procedure: CORONARY ANGIOPLASTY WITH STENT PLACEMENT (pLAD); Procedure: [facility]   ESOPHAGOGASTRODUODENOSCOPY      ESOPHAGOGASTRODUODENOSCOPY (EGD) WITH PROPOFOL  N/A 05/05/2021   Procedure: ESOPHAGOGASTRODUODENOSCOPY (EGD) WITH PROPOFOL ;  Surgeon: Maryruth Ole DASEN, MD;  Location: [facility];  Service: Endoscopy;  Laterality: N/A;   ESOPHAGOGASTRODUODENOSCOPY (EGD) WITH PROPOFOL  N/A 08/28/2021   Procedure: ESOPHAGOGASTRODUODENOSCOPY (EGD) WITH PROPOFOL ;  Surgeon: Wilhelmenia Aloha Raddle., MD;  Location: [facility];  Service: Gastroenterology;  Laterality: N/A;   ESOPHAGOGASTRODUODENOSCOPY (EGD) WITH PROPOFOL  N/A 08/17/2022   Procedure: ESOPHAGOGASTRODUODENOSCOPY (EGD) WITH PROPOFOL ;  Surgeon: Maryruth Ole DASEN, MD;  Location: [facility];  Service: Endoscopy;  Laterality: N/A;   EUS N/A 08/28/2021   Procedure: UPPER ENDOSCOPIC ULTRASOUND (EUS) RADIAL;  Surgeon: Wilhelmenia Aloha Raddle., MD;  Location: [facility];  Service: Gastroenterology;  Laterality: N/A;   EXCISION OF SKIN TAG N/A 03/06/2024   Procedure: EXCISION, SKIN TAG;  Surgeon: Rodolph Romano, MD;  Location: [facility];  Service: General;  Laterality: N/A;   IR RADIOLOGIST EVAL & MGMT  09/17/2024  IR RADIOLOGIST EVAL & MGMT  10/27/2024   LOWER EXTREMITY ANGIOGRAPHY Left 01/10/2024   Procedure: Lower Extremity Angiography;  Surgeon: Jama Cordella MATSU, MD;  Location: [facility];  Service: Cardiovascular;  Laterality: Left;   LUMBAR LAMINECTOMY/DECOMPRESSION MICRODISCECTOMY Left 02/03/2015   Procedure: LUMBAR LAMINECTOMY/DECOMPRESSION MICRODISCECTOMY LEFT LUMBAR FOUR-FIVE WITH REMOVAL OF SYNOVIAL CYST;  Surgeon: Reyes Budge, MD;  Location: [facility];  Service: Neurosurgery;  Laterality: Left;   NASAL SEPTOPLASTY W/ TURBINOPLASTY Bilateral 10/23/2019   Procedure: NASAL SEPTOPLASTY WITH SUBMUCOUS RESECTION OF TURBINATE;  Surgeon: Herminio Miu, MD;  Location: [facility];  Service: ENT;  Laterality: Bilateral;   PELVIC ANGIOGRAPHY N/A 05/02/2018   Procedure: PELVIC ANGIOGRAPHY;  Surgeon: Jama Cordella MATSU,  MD;  Location: [facility];  Service: Cardiovascular;  Laterality: N/A;   POLYPECTOMY  08/28/2021   Procedure: POLYPECTOMY;  Surgeon: Wilhelmenia Aloha Raddle., MD;  Location: [facility];  Service: Gastroenterology;;   RECTAL EXAM UNDER ANESTHESIA N/A 03/06/2024   Procedure: ERASMO UNDER ANESTHESIA, RECTUM;  Surgeon: Rodolph Romano, MD;  Location: [facility];  Service: General;  Laterality: N/A;   SHOULDER ARTHROSCOPY WITH SUBACROMIAL DECOMPRESSION AND BICEP TENDON REPAIR Right 12/04/2018   Procedure: SHOULDER ARTHROSCOPY WITH DEBRIDEMENT, DECOMPRESSION, BICEP TENODESIS;  Surgeon: Edie Norleen PARAS, MD;  Location: [facility];  Service: Orthopedics;  Laterality: Right;   SUBMUCOSAL INJECTION  08/28/2021   Procedure: SUBMUCOSAL INJECTION;  Surgeon: Wilhelmenia Aloha Raddle., MD;  Location: [facility];  Service: Gastroenterology;;    Allergies: Fosamax [alendronate sodium] and Percocet [oxycodone -acetaminophen ]  Medications: Prior to Admission medications  Medication Sig Start Date End Date Taking? Authorizing Provider  ALPRAZolam  (XANAX ) 0.5 MG tablet Take 0.5 mg by mouth 3 (three) times daily as needed for anxiety.     [provider]  amLODipine  (NORVASC ) 2.5 MG tablet Take 5 mg by mouth daily.    [provider]  [Paused] aspirin  EC 81 MG tablet Take 81 mg by mouth daily. Wait to take this until your doctor or other care provider tells you to start again.    [provider]  azaTHIOprine (IMURAN) 50 MG tablet Take 125 mg by mouth daily.     [provider]  finasteride  (PROSCAR ) 5 MG tablet Take 1 tablet (5 mg total) by mouth daily. 09/09/24   Francisca Redell BROCKS, MD  hydrochlorothiazide  (HYDRODIURIL ) 12.5 MG tablet Take 12.5 mg by mouth daily.    [provider]  hydroxychloroquine  (PLAQUENIL ) 200 MG tablet Take 200 mg by mouth 2 (two) times daily.    [provider]  metoprolol  succinate (TOPROL -XL) 50 MG 24 hr tablet Take 50 mg by  mouth at bedtime. Take with or immediately following a meal.    [provider]  nitroGLYCERIN (NITROLINGUAL) 0.4 MG/SPRAY spray Place 1 spray under the tongue every 5 (five) minutes x 3 doses as needed for chest pain.    [provider]  pantoprazole  (PROTONIX ) 20 MG tablet Take 1 tablet (20 mg total) by mouth daily. 01/11/24 01/10/25  Schnier, Cordella MATSU, MD  rosuvastatin  (CRESTOR ) 40 MG tablet Take 40 mg by mouth daily.    [provider]  tadalafil  (CIALIS ) 20 MG tablet Take 1 tablet (20 mg total) by mouth daily as needed for erectile dysfunction. 09/09/24   Francisca Redell BROCKS, MD  tamsulosin  (FLOMAX ) 0.4 MG CAPS capsule Take 1 capsule (0.4 mg total) by mouth daily after breakfast. 09/09/24   Francisca Redell BROCKS, MD     Family History  Problem Relation  Age of Onset   Leukemia Mother        dx 23s   Hyperlipidemia Father    Hypertension Father    Diabetes Father    Colon cancer Father        dx 89s   Hearing loss Father    Vascular Disease Father    Lung cancer Brother        d. 71    Social History   Socioeconomic History   Marital status: Married    Spouse name: TAIWAN, TALCOTT (Spouse) [phone] (Home Phone)   Number of children: Not on file   Years of education: Not on file   Highest education level: Not on file  Occupational History   Not on file  Tobacco Use   Smoking status: Former    Current packs/day: 0.00    Average packs/day: 1.5 packs/day for 30.0 years (45.0 ttl pk-yrs)    Types: Cigarettes    Start date: 04/01/1991    Quit date: 03/31/2021    Years since quitting: 3.5   Smokeless tobacco: Never  Vaping Use   Vaping status: Never Used  Substance and Sexual Activity   Alcohol use: Yes    Alcohol/week: 14.0 standard drinks of alcohol    Types: 14 Standard drinks or equivalent per week    Comment: cocktails about 2 daily   Drug use: No   Sexual activity: Yes  Other Topics Concern   Not on file  Social History Narrative   Not on file    Social Drivers of Health   Tobacco Use: Medium Risk (10/09/2024)   Patient History    Smoking Tobacco Use: Former    Smokeless Tobacco Use: Never    Passive Exposure: Not on file  Financial Resource Strain: Low Risk  (02/17/2024)   Received from [facility]   Overall Financial Resource Strain (CARDIA)    Difficulty of Paying Living Expenses: Not hard at all  Food Insecurity: No Food Insecurity (10/09/2024)   Epic    Worried About Radiation Protection Practitioner of Food in the Last Year: Never true    Ran Out of Food in the Last Year: Never true  Transportation Needs: No Transportation Needs (10/09/2024)   Epic    Lack of Transportation (Medical): No    Lack of Transportation (Non-Medical): No  Physical Activity: Not on file  Stress: Not on file  Social Connections: Socially Integrated (10/09/2024)   Social Connection and Isolation Panel    Frequency of Communication with Friends and Family: More than three times a week    Frequency of Social Gatherings with Friends and Family: More than three times a week    Attends Religious Services: 1 to 4 times per year    Active Member of [employer] or Organizations: Yes    Attends Banker Meetings: 1 to 4 times per year    Marital Status: Married  Depression (EYV7-0): Not on file  Alcohol Screen: Not on file  Housing: Low Risk (10/09/2024)   Epic    Unable to Pay for Housing in the Last Year: No    Number of Times Moved in the Last Year: 0    Homeless in the Last Year: No  Utilities: Not At Risk (10/09/2024)   Epic    Threatened with loss of utilities: No  Health Literacy: Not on file   Review of Systems: A 12 point ROS discussed and pertinent positives are indicated in the HPI above.  All other systems are negative.  Review of Systems  Vital Signs: BP (!) 149/90   Pulse 90   Temp 98.4 F (36.9 C) (Oral)   Resp 14   SpO2 98%   Advance Care Plan: The advanced care plan/surrogate decision maker was discussed at the  time of visit and the patient did not wish to discuss or was not able to name a surrogate decision maker or provide an advance care plan.    Physical Exam Constitutional:      General: He is not in acute distress.    Appearance: Normal appearance. He is normal weight.  HENT:     Head: Normocephalic and atraumatic.  Eyes:     General: No scleral icterus. Cardiovascular:     Rate and Rhythm: Normal rate.  Pulmonary:     Effort: Pulmonary effort is normal.  Abdominal:     General: There is no distension.     Palpations: Abdomen is soft.     Tenderness: There is no abdominal tenderness. There is no guarding.  Skin:    General: Skin is warm and dry.  Neurological:     Mental Status: He is alert and oriented to person, place, and time.  Psychiatric:        Mood and Affect: Mood normal.        Behavior: Behavior normal.       Imaging: IR Radiologist Eval & Mgmt Result Date: 10/27/2024 EXAM: ESTABLISHED PATIENT OFFICE VISIT CHIEF COMPLAINT: SEE NOTE IN EPIC HISTORY OF PRESENT ILLNESS: SEE NOTE IN EPIC REVIEW OF SYSTEMS: SEE NOTE IN EPIC PHYSICAL EXAMINATION: SEE NOTE IN EPIC ASSESSMENT AND PLAN: SEE NOTE IN EPIC Electronically Signed   By: Wilkie Lent M.D.   On: 10/27/2024 11:45   CT RENAL TUMOR ABLATION UNILATERAL Result Date: 10/09/2024 INDICATION: 73 year old male margin Hansen mass exophytic from the left kidney. Imaging appearance is highly concerning for renal cell carcinoma. Therefore, he presents for concurrent biopsy and percutaneous microwave ablation. EXAM: CT-guided thermal ablation of renal mass CT-guided core biopsy of renal mass COMPARISON:  MRI abdomen 08/18/2019 MEDICATIONS: 2 g Ancef ; The antibiotic was administered in an appropriate time interval prior to needle puncture of the skin. ANESTHESIA/SEDATION: General - as administered by the Anesthesia department FLUOROSCOPY: None. COMPLICATIONS: SIR LEVEL B - Normal therapy, includes overnight admission for  observation. TECHNIQUE: Informed written consent was obtained from the patient after a thorough discussion of the procedural risks, benefits and alternatives. All questions were addressed. Maximal Sterile Barrier Technique was utilized including caps, mask, sterile gowns, sterile gloves, sterile drape, hand hygiene and skin antiseptic. A timeout was performed prior to the initiation of the procedure. CT imaging was performed. The exophytic mass arising from the upper pole of the left kidney was identified. A suitable skin entry site was selected and marked. The skin was sterilely prepped and draped in the standard fashion using chlorhexidine  skin prep. Local anesthesia was attained by infiltration with 1% lidocaine . A small dermatotomy was made. Under intermittent CT guidance, a 17 gauge 15 cm trocar needle was carefully advanced and positioned at the margin of the lesion. A single core biopsy specimen was obtained, placed in formalin and delivered to pathology for further analysis. Concurrently, a 20 cm PR microwave antenna was carefully advanced and used to pierce the lesion. Micro ablation was then performed. The probe was powered at 65 watts and ablation was maintained for 6 minutes and 50 seconds. Following successful ablation, the antenna was removed. Post ablation CT imaging demonstrates an active subcapsular  bleed and complete de enhancement of the treated lesion. The patient was capped on the CT scanner and repeat imaging performed at 10 and 15 minutes. This demonstrates minimal enlargement of the subcapsular hematoma. No evidence of retroperitoneal hemorrhage. Normal contrast excretion. FINDINGS: Small to moderate subcapsular hematoma following percutaneous thermal ablation. IMPRESSION: 1. Successful CT-guided core biopsy of renal mass. 2. Successful CT-guided percutaneous microwave ablation of left renal mass. 3. Post ablation imaging demonstrates a small to moderate subcapsular hematoma. Patient will be  admitted for observation and bed rest overnight. Electronically Signed   By: Wilkie Lent M.D.   On: 10/09/2024 11:25   CT RENAL BIOPSY Result Date: 10/09/2024 INDICATION: 73 year old male margin Hansen mass exophytic from the left kidney. Imaging appearance is highly concerning for renal cell carcinoma. Therefore, he presents for concurrent biopsy and percutaneous microwave ablation. EXAM: CT-guided thermal ablation of renal mass CT-guided core biopsy of renal mass COMPARISON:  MRI abdomen 08/18/2019 MEDICATIONS: 2 g Ancef ; The antibiotic was administered in an appropriate time interval prior to needle puncture of the skin. ANESTHESIA/SEDATION: General - as administered by the Anesthesia department FLUOROSCOPY: None. COMPLICATIONS: SIR LEVEL B - Normal therapy, includes overnight admission for observation. TECHNIQUE: Informed written consent was obtained from the patient after a thorough discussion of the procedural risks, benefits and alternatives. All questions were addressed. Maximal Sterile Barrier Technique was utilized including caps, mask, sterile gowns, sterile gloves, sterile drape, hand hygiene and skin antiseptic. A timeout was performed prior to the initiation of the procedure. CT imaging was performed. The exophytic mass arising from the upper pole of the left kidney was identified. A suitable skin entry site was selected and marked. The skin was sterilely prepped and draped in the standard fashion using chlorhexidine  skin prep. Local anesthesia was attained by infiltration with 1% lidocaine . A small dermatotomy was made. Under intermittent CT guidance, a 17 gauge 15 cm trocar needle was carefully advanced and positioned at the margin of the lesion. A single core biopsy specimen was obtained, placed in formalin and delivered to pathology for further analysis. Concurrently, a 20 cm PR microwave antenna was carefully advanced and used to pierce the lesion. Micro ablation was then performed. The  probe was powered at 65 watts and ablation was maintained for 6 minutes and 50 seconds. Following successful ablation, the antenna was removed. Post ablation CT imaging demonstrates an active subcapsular bleed and complete de enhancement of the treated lesion. The patient was capped on the CT scanner and repeat imaging performed at 10 and 15 minutes. This demonstrates minimal enlargement of the subcapsular hematoma. No evidence of retroperitoneal hemorrhage. Normal contrast excretion. FINDINGS: Small to moderate subcapsular hematoma following percutaneous thermal ablation. IMPRESSION: 1. Successful CT-guided core biopsy of renal mass. 2. Successful CT-guided percutaneous microwave ablation of left renal mass. 3. Post ablation imaging demonstrates a small to moderate subcapsular hematoma. Patient will be admitted for observation and bed rest overnight. Electronically Signed   By: Wilkie Lent M.D.   On: 10/09/2024 11:25    Labs:  CBC: Recent Labs    01/14/24 1023 01/14/24 1023 10/09/24 0729 10/09/24 1224 10/09/24 1823 10/09/24 2356 10/10/24 0552  WBC 5.1  --  4.5  --   --   --  11.6*  HGB 15.3   < > 15.4 14.1 13.8 12.7* 13.2  HCT 44.3  --  45.0 41.5 39.0 37.2* 38.1*  PLT 235  --  251  --   --   --  222   < > =  values in this interval not displayed.    COAGS: Recent Labs    10/09/24 0729  INR 0.9    BMP: Recent Labs    01/10/24 0812 01/14/24 1023 10/09/24 0729 10/10/24 0552  NA  --  133* 138 136  K  --  3.9 4.0 4.1  CL  --  99 101 100  CO2  --  26 27 26   GLUCOSE  --  100* 113* 106*  BUN 14 14 13 18   CALCIUM   --  8.9 9.2 9.0  CREATININE 0.77 0.82 0.81 1.01  GFRNONAA >60 >60 >60 >60    LIVER FUNCTION TESTS: Recent Labs    01/14/24 1023  BILITOT 1.1  AST 30  ALT 32  ALKPHOS 57  PROT 7.0  ALBUMIN 3.7    TUMOR MARKERS: No results for input(s): AFPTM, CEA, CA199, CHROMGRNA in the last 8760 hours.  Assessment and Plan:  73 yo gentleman doing very  well 2 weeks post biopsy and percutaneous ablation of left renal mass (biopsy proven low-grade oncocytic tumor).  He is fully recovered and back to full activity.    iSTAT shows creatinine is normal at 0.9.  Given benign nature of his tumor, only 1 follow-up CT scan is required.   1.) Renal protocol CT abdomen W and WO and clinic visit in 6 months.      Electronically Signed: Wilkie MARLA Lent 10/27/2024, 11:48 AM   I spent a total of  15 Minutes in face to face in clinical consultation, greater than 50% of which was counseling/coordinating care for left renal low-grade oncocytic tumor (LOT)

## 2024-10-27 NOTE — Progress Notes (Signed)
DUPLICATE NOTE

## 2024-12-08 ENCOUNTER — Other Ambulatory Visit (INDEPENDENT_AMBULATORY_CARE_PROVIDER_SITE_OTHER): Payer: Self-pay | Admitting: Vascular Surgery

## 2025-01-19 ENCOUNTER — Other Ambulatory Visit: Payer: PRIVATE HEALTH INSURANCE

## 2025-01-20 ENCOUNTER — Other Ambulatory Visit: Payer: PRIVATE HEALTH INSURANCE

## 2025-01-26 ENCOUNTER — Ambulatory Visit: Payer: PRIVATE HEALTH INSURANCE | Admitting: Oncology

## 2025-01-27 ENCOUNTER — Ambulatory Visit: Payer: PRIVATE HEALTH INSURANCE | Admitting: Oncology

## 2025-02-01 ENCOUNTER — Ambulatory Visit (INDEPENDENT_AMBULATORY_CARE_PROVIDER_SITE_OTHER): Payer: PRIVATE HEALTH INSURANCE | Admitting: Vascular Surgery

## 2025-02-01 ENCOUNTER — Encounter (INDEPENDENT_AMBULATORY_CARE_PROVIDER_SITE_OTHER)

## 2025-02-01 ENCOUNTER — Encounter (INDEPENDENT_AMBULATORY_CARE_PROVIDER_SITE_OTHER): Payer: PRIVATE HEALTH INSURANCE

## 2025-03-10 ENCOUNTER — Ambulatory Visit: Payer: PRIVATE HEALTH INSURANCE | Admitting: Urology
